# Patient Record
Sex: Male | Born: 1935 | Race: White | Hispanic: No | Marital: Married | Smoking: Never smoker
Health system: Southern US, Community
[De-identification: ages and names within clinical notes are randomized; demographics above are authoritative.]

## PROBLEM LIST (undated history)

## (undated) DIAGNOSIS — F0390 Unspecified dementia without behavioral disturbance: Secondary | ICD-10-CM

## (undated) DIAGNOSIS — R5383 Other fatigue: Secondary | ICD-10-CM

## (undated) DIAGNOSIS — H9190 Unspecified hearing loss, unspecified ear: Secondary | ICD-10-CM

## (undated) DIAGNOSIS — T8859XA Other complications of anesthesia, initial encounter: Secondary | ICD-10-CM

## (undated) DIAGNOSIS — D649 Anemia, unspecified: Secondary | ICD-10-CM

## (undated) DIAGNOSIS — G20 Parkinson's disease: Secondary | ICD-10-CM

## (undated) DIAGNOSIS — E785 Hyperlipidemia, unspecified: Secondary | ICD-10-CM

## (undated) DIAGNOSIS — J219 Acute bronchiolitis, unspecified: Secondary | ICD-10-CM

## (undated) DIAGNOSIS — M7071 Other bursitis of hip, right hip: Secondary | ICD-10-CM

## (undated) DIAGNOSIS — G20A1 Parkinson's disease without dyskinesia, without mention of fluctuations: Secondary | ICD-10-CM

## (undated) DIAGNOSIS — M810 Age-related osteoporosis without current pathological fracture: Secondary | ICD-10-CM

## (undated) DIAGNOSIS — M1990 Unspecified osteoarthritis, unspecified site: Secondary | ICD-10-CM

## (undated) DIAGNOSIS — Z85828 Personal history of other malignant neoplasm of skin: Secondary | ICD-10-CM

## (undated) DIAGNOSIS — M7072 Other bursitis of hip, left hip: Secondary | ICD-10-CM

## (undated) DIAGNOSIS — R5381 Other malaise: Secondary | ICD-10-CM

## (undated) DIAGNOSIS — R7303 Prediabetes: Secondary | ICD-10-CM

## (undated) DIAGNOSIS — E559 Vitamin D deficiency, unspecified: Secondary | ICD-10-CM

## (undated) DIAGNOSIS — R002 Palpitations: Secondary | ICD-10-CM

## (undated) DIAGNOSIS — I739 Peripheral vascular disease, unspecified: Secondary | ICD-10-CM

## (undated) HISTORY — DX: Age-related osteoporosis without current pathological fracture: M81.0

## (undated) HISTORY — DX: Vitamin D deficiency, unspecified: E55.9

## (undated) HISTORY — DX: Other bursitis of hip, right hip: M70.72

## (undated) HISTORY — DX: Other bursitis of hip, right hip: M70.71

## (undated) HISTORY — DX: Personal history of other malignant neoplasm of skin: Z85.828

## (undated) HISTORY — PX: COLONOSCOPY: SHX174

## (undated) HISTORY — DX: Unspecified hearing loss, unspecified ear: H91.90

## (undated) HISTORY — DX: Acute bronchiolitis, unspecified: J21.9

## (undated) HISTORY — DX: Hyperlipidemia, unspecified: E78.5

## (undated) HISTORY — DX: Other malaise: R53.83

## (undated) HISTORY — PX: OTHER SURGICAL HISTORY: SHX169

## (undated) HISTORY — DX: Other malaise: R53.81

## (undated) HISTORY — DX: Palpitations: R00.2

## (undated) HISTORY — DX: Unspecified osteoarthritis, unspecified site: M19.90

## (undated) HISTORY — DX: Anemia, unspecified: D64.9

---

## 2015-05-31 HISTORY — PX: CATARACT EXTRACTION: SUR2

## 2020-05-21 ENCOUNTER — Encounter: Payer: Self-pay | Admitting: Neurology

## 2020-08-03 NOTE — Progress Notes (Signed)
Assessment/Plan:   1.  Idiopathic Parkinson's disease.  The patient has tremor, bradykinesia, rigidity and mild postural instability.  -We discussed the diagnosis as well as pathophysiology of the disease.  We discussed treatment options as well as prognostic indicators.  Patient education was provided.  -We discussed that it used to be thought that levodopa would increase risk of melanoma but now it is believed that Parkinsons itself likely increases risk of melanoma. he is to get regular skin checks.  He was given names of local dermatologist.  He does have a history of basal cell carcinoma.  -Greater than 50% of the 60 minute visit was spent in counseling answering questions and talking about what to expect now as well as in the future.  We talked about medication options as well as potential future surgical options.  We talked about safety in the home.  -pt currently underdosed.  I am going to have him take his carbidopa/levodopa 25/100, 1 tablet at 8 AM/11 AM/2 PM/5 PM  -We will add carbidopa/levodopa 50/200 CR at bedtime.  Hopefully, this will help him get up at night and walk to the bathroom.  -Suspect he may need more carbidopa levodopa in the morning.  He was seen 2 hours after dosing this morning and still was rigid, but we will see how he does with the above dosing schedule.  -[facility]  -We discussed community resources in the area including patient support groups and community exercise programs for PD and pt education was provided to the patient.  He met with my LCSW today.  2.  Urinary frequency  -Encouraged him to establish with urology local.  He still needs to find a primary care physician as well.  He just moved to the area 2 weeks ago.  Subjective:   Greg [name] was seen today in the movement disorders clinic for neurologic consultation at the request of Geanie Berlin, MD.  The consultation is for the evaluation of Parkinson's  disease.  Patient was previously following with Dr. Cornelia Copa in [place], [place] through [facility].  This patient is accompanied in the office by his spouse who supplements the history.  Patient's first visit to Dr. Cornelia Copa was in January, 2019.  Symptoms began 2 years prior.  First symptom was slowness of movement.  He was started on levodopa in January, 2019.  When he followed up in April, 2019, notes indicate that patient had significant improvement with levodopa.  Patient was last seen by Dr. Cornelia Copa in April, 2021.     Specific Symptoms:  Tremor: No. (rarely if uses utensil when eating) Family hx of similar:  No. Voice: gotten weaker Sleep: sleeps well  Vivid Dreams:  Yes.    Acting out dreams:  No. Wet Pillows: No. Postural symptoms:  Yes.  , esp with clutter in his way.  He also notes some stutter steps with the L foot  Falls?  Yes.  , had one 05/03/20, they were packing to move and stepped on a wheel of a chair and fell - hurt back a little Bradykinesia symptoms: shuffling gait, slow movements and difficulty getting out of a chair Loss of smell:  No. but admits to long term lack of smell Loss of taste:  No. Urinary Incontinence:  No. Difficulty Swallowing:  No. Handwriting, micrographia: No. but always written small Trouble with ADL's:  No. but he is very slow and if needs to go fast, he  will get help  Trouble buttoning clothing: Yes.   Depression:  No. Memory changes:  No. Hallucinations:  No.  visual distortions: No. N/V:  No. Lightheaded:  No.  Syncope: No. Diplopia:  No. Dyskinesia:  No. Prior exposure to reglan/antipsychotics: No.  Neuroimaging of the brain has  previously been performed.  It is not available for my review today.  I do have the report.  It was done on May 14, 2017, MRI brain.  It was reported to show mild small vessel disease as well as a few old microhemorrhages in the right cerebellum and right cerebral hemisphere.  He also had a  MRI of the cervical spine that same date demonstrating multilevel degenerative changes, most pronounced at C3-C4.  Currently taking carbidopa/levodopa 25/100 at 8am/noon/8pm.  Wife reports lots of trouble getting OOB in middle of night and trouble in evening as well.  ALLERGIES:  No Known Allergies  CURRENT MEDICATIONS:  Current Outpatient Medications  Medication Instructions  . Calcium Carbonate-Vitamin D (CALCIUM-VITAMIN D3 PO) Oral  . carbidopa-levodopa (SINEMET IR) 25-100 MG tablet 1 tablet, Oral, 3 times daily  . Multiple Vitamins-Minerals (MACULAR HEALTH FORMULA) CAPS Oral    Objective:   VITALS:   Vitals:   08/07/20 0925  BP: 128/72  Pulse: 86  SpO2: 97%  Weight: 187 lb (84.8 kg)  Height: 5' 9.5" (1.765 m)    GEN:  The patient appears stated age and is in NAD. HEENT:  Normocephalic, atraumatic.  The mucous membranes are moist. The superficial temporal arteries are without ropiness or tenderness. CV:  RRR Lungs:  CTAB Neck/HEME:  There are no carotid bruits bilaterally.  Neurological examination:  Orientation: The patient is alert and oriented x3.  Cranial nerves: There is good facial symmetry. There is facial hypomimia.  Extraocular muscles are intact. The visual fields are full to confrontational testing. The speech is fluent and clear. Soft palate rises symmetrically and there is no tongue deviation. Hearing is intact to conversational tone. Sensation: Sensation is intact to light and pinprick throughout (facial, trunk, extremities). Vibration is decreased distally. There is no extinction with double simultaneous stimulation. There is no sensory dermatomal level identified. Motor: Strength is 5/5 in the bilateral upper and lower extremities.   Shoulder shrug is equal and symmetric.  There is no pronator drift. Deep tendon reflexes: Deep tendon reflexes are 2/4 at the bilateral biceps, triceps, brachioradialis, patella and achilles. Plantar responses are downgoing  bilaterally.  Movement examination: Tone: There is mild to mod increase, L>R Abnormal movements: there is LLE tremor, mild and rare.   Coordination:  There is decremation with RAM's, only with hand opening and closing and toe taps.   Gait and Station: The patient has no difficulty arising out of a deep-seated chair without the use of the hands. The patient's stride length is good, with minor drag of the left leg.  The patient has a negative pull test.       Total time spent on today's visit was 60 minutes, including both face-to-face time and nonface-to-face time.  Time included that spent on review of records (prior notes available to me/labs/imaging if pertinent), discussing treatment and goals, answering patient's questions and coordinating care.  Cc:  Pcp, No

## 2020-08-06 ENCOUNTER — Encounter: Payer: Self-pay | Admitting: *Deleted

## 2020-08-07 ENCOUNTER — Ambulatory Visit (INDEPENDENT_AMBULATORY_CARE_PROVIDER_SITE_OTHER): Payer: Medicare Other | Admitting: Neurology

## 2020-08-07 ENCOUNTER — Other Ambulatory Visit: Payer: Self-pay

## 2020-08-07 ENCOUNTER — Encounter: Payer: Self-pay | Admitting: Neurology

## 2020-08-07 VITALS — BP 128/72 | HR 86 | Ht 69.5 in | Wt 187.0 lb

## 2020-08-07 DIAGNOSIS — G20 Parkinson's disease: Secondary | ICD-10-CM | POA: Diagnosis not present

## 2020-08-07 MED ORDER — CARBIDOPA-LEVODOPA 25-100 MG PO TABS: 1.0000 | ORAL_TABLET | Freq: Four times a day (QID) | ORAL | 1 refills | Status: DC

## 2020-08-07 MED ORDER — CARBIDOPA-LEVODOPA ER 50-200 MG PO TBCR: 1.0000 | EXTENDED_RELEASE_TABLET | Freq: Every day | ORAL | 1 refills | Status: DC

## 2020-08-07 NOTE — Patient Instructions (Signed)
1.  Take carbidopa/levodopa 25/100, 1 tablet at 8am/10am/3pm/5pm Start Carbidopa Levodopa as follows:   As a reminder, carbidopa/levodopa can be taken at the same time as a carbohydrate, but we like to have you take your pill either 30 minutes before a protein source or 1 hour after as protein can interfere with carbidopa/levodopa absorption. 2.  ADD carbidopa/levodopa 50/200 CR at bedtime 3.  [facility].  As we discussed, it used to be thought that levodopa would increase risk of melanoma but now it is believed that Parkinsons itself likely increases risk of melanoma. I recommend yearly skin checks with a board certified dermatologist.  You can call [facility] or [facility] for an appointment.  [facility] Address: [address] [place], [place], [place] [address] Phone: [phone]  [facility] [address] [place], [place], [place] Phone: [phone]

## 2020-09-25 ENCOUNTER — Other Ambulatory Visit: Payer: Self-pay

## 2020-09-25 ENCOUNTER — Non-Acute Institutional Stay: Payer: Medicare Other | Admitting: Nurse Practitioner

## 2020-09-25 DIAGNOSIS — M159 Polyosteoarthritis, unspecified: Secondary | ICD-10-CM | POA: Insufficient documentation

## 2020-09-25 DIAGNOSIS — R609 Edema, unspecified: Secondary | ICD-10-CM | POA: Insufficient documentation

## 2020-09-25 DIAGNOSIS — G20 Parkinson's disease: Secondary | ICD-10-CM | POA: Insufficient documentation

## 2020-09-25 DIAGNOSIS — R350 Frequency of micturition: Secondary | ICD-10-CM

## 2020-09-25 DIAGNOSIS — M810 Age-related osteoporosis without current pathological fracture: Secondary | ICD-10-CM | POA: Insufficient documentation

## 2020-09-25 DIAGNOSIS — N401 Enlarged prostate with lower urinary tract symptoms: Secondary | ICD-10-CM | POA: Diagnosis not present

## 2020-09-25 DIAGNOSIS — N400 Enlarged prostate without lower urinary tract symptoms: Secondary | ICD-10-CM | POA: Insufficient documentation

## 2020-09-25 DIAGNOSIS — G20A1 Parkinson's disease without dyskinesia, without mention of fluctuations: Secondary | ICD-10-CM | POA: Insufficient documentation

## 2020-09-25 DIAGNOSIS — M8949 Other hypertrophic osteoarthropathy, multiple sites: Secondary | ICD-10-CM | POA: Diagnosis not present

## 2020-09-25 DIAGNOSIS — E785 Hyperlipidemia, unspecified: Secondary | ICD-10-CM | POA: Insufficient documentation

## 2020-09-25 NOTE — Progress Notes (Signed)
Provider:  Marlana Latus NP Location:   clinic [place]   Place of Service:  Clinic (12)  PCP: Pcp, No Patient Care Team: Pcp, No as PCP - General [name], Eustace Quail, DO as Consulting Physician (Neurology)  Extended Emergency Contact Information Primary Emergency Contact: kyland, no Mobile Phone: [phone] Relation: Spouse Secondary Emergency Contact: abhinav, mayorquin Mobile Phone: [phone] Relation: Son  Code Status: DNR Goals of Care: Advanced Directive information Advanced Directives 09/26/2020  Does Patient Have a Medical Advance Directive? No  Type of Advance Directive -  Would patient like information on creating a medical advance directive? No - Patient declined      Chief Complaint  Patient presents with   Establish Care    Patient presents to establish care.      HPI: Patient is a 87 y.o. male seen today for admission to [facility]  Hx of Parkinson's disease, f/u Neurology, well controlled tremor Bradykinesia, mild rigidity, postural instability, had one fall in the past, on Sinemet  BPH/Urinary frequency, 1-2x/night,  f/u Urology  OA in hands base of thumbs  Minimal swelling in ankles for years. Suggested to use compression hosiery with zipper on side.   OP took Fosamax for 7 years, stopped 2018, on Can Vit D, no fxs  Hyperlipidemia, not taking statin  Past Medical History:  Diagnosis Date   Acute bronchiolitis    Anemia    Bursitis of both hips    Hearing reduced    hearing aid   History of basal cell carcinoma    Hyperlipidemia    Malaise and fatigue    Osteoarthritis    hand   Osteoporosis    Palpitation    Vitamin D deficiency    Past Surgical History:  Procedure Laterality Date   CATARACT EXTRACTION  05/31/2015   Eye/right   COLONOSCOPY  02/05/2005, 03/06/2005   normal, 10 years ago   MOHS micrographic surgery     Face surgery    reports that he has never smoked. He has never used smokeless tobacco. He reports that he does not drink alcohol  and does not use drugs. Social History   Socioeconomic History   Marital status: Married    Spouse name: [name]   Number of children: 2   Years of education: Not on file   Highest education level: Not on file  Occupational History   Occupation: retired    Comment: art professor  Tobacco Use   Smoking status: Never   Smokeless tobacco: Never  Vaping Use   Vaping Use: Never used  Substance and Sexual Activity   Alcohol use: Never   Drug use: Never   Sexual activity: Not on file  Other Topics Concern   Not on file  Social History Narrative   Pt just move from [place] to [place]   Pt leaving with wife   Social Determinants of Health   Financial Resource Strain: Not on file  Food Insecurity: Not on file  Transportation Needs: Not on file  Physical Activity: Not on file  Stress: Not on file  Social Connections: Not on file  Intimate Partner Violence: Not on file    Functional Status Survey:    Family History  Problem Relation Age of Onset   Prostate cancer Father    Skin cancer Brother     Health Maintenance  Topic Date Due   TETANUS/TDAP  Never done   Zoster Vaccines- Shingrix (1 of 2) Never done   COVID-19 Vaccine (2 Nature conservation officer series)  09/22/2020   INFLUENZA VACCINE  11/05/2020   PNA vac Low Risk Adult  Completed   HPV VACCINES  Aged Out    No Known Allergies  Allergies as of 09/25/2020   No Known Allergies      Medication List        Accurate as of September 25, 2020 11:59 PM. If you have any questions, ask your nurse or doctor.          CALCIUM-VITAMIN D3 PO Take by mouth.   carbidopa-levodopa 25-100 MG tablet Commonly known as: SINEMET IR Take 1 tablet by mouth 4 (four) times daily.   carbidopa-levodopa 50-200 MG tablet Commonly known as: SINEMET CR Take 1 tablet by mouth at bedtime.   Macular Health Formula Caps Take by mouth.        Review of Systems  Constitutional:  Negative for activity change, appetite change and fever.   HENT:  Negative for congestion, trouble swallowing and voice change.   Eyes:  Negative for visual disturbance.  Respiratory:  Negative for cough, choking and shortness of breath.   Cardiovascular:  Positive for leg swelling. Negative for chest pain and palpitations.  Gastrointestinal:  Negative for abdominal pain, constipation, diarrhea, nausea and vomiting.  Genitourinary:  Negative for difficulty urinating and urgency.       1-2x/night nocturnal urination.   Musculoskeletal:  Positive for arthralgias and gait problem.  Skin:  Negative for color change.  Neurological:  Positive for tremors. Negative for speech difficulty, weakness and headaches.  Psychiatric/Behavioral:  Negative for confusion and sleep disturbance. The patient is not nervous/anxious.    Vitals:   09/26/20 0854  BP: 136/72  Pulse: 86  Resp: 18  Temp: 97.9 F (36.6 C)  SpO2: 97%  Weight: 190 lb 6.4 oz (86.4 kg)  Height: '5\' 9"'  (1.753 m)   Body mass index is 28.12 kg/m. Physical Exam Vitals reviewed.  Constitutional:      General: He is not in acute distress.    Appearance: Normal appearance. He is not ill-appearing, toxic-appearing or diaphoretic.  HENT:     Head: Normocephalic and atraumatic.     Nose: Nose normal.     Mouth/Throat:     Mouth: Mucous membranes are moist.  Eyes:     Extraocular Movements: Extraocular movements intact.     Conjunctiva/sclera: Conjunctivae normal.     Pupils: Pupils are equal, round, and reactive to light.  Cardiovascular:     Rate and Rhythm: Normal rate and regular rhythm.     Heart sounds: No murmur heard. Pulmonary:     Effort: Pulmonary effort is normal.     Breath sounds: No rales.  Abdominal:     Palpations: Abdomen is soft.     Tenderness: There is no abdominal tenderness. There is no guarding or rebound.  Musculoskeletal:     Cervical back: Normal range of motion and neck supple. No rigidity.     Right lower leg: Edema present.     Left lower leg: Edema  present.     Comments: Trace to 1+ edema BLE  Skin:    General: Skin is warm and dry.  Neurological:     General: No focal deficit present.     Mental Status: He is alert and oriented to person, place, and time. Mental status is at baseline.     Motor: No weakness.     Gait: Gait abnormal.     Comments: Mild overall stiffness   Psychiatric:  Mood and Affect: Mood normal.        Behavior: Behavior normal.        Thought Content: Thought content normal.        Judgment: Judgment normal.    Labs reviewed: Basic Metabolic Panel: No results for input(s): NA, K, CL, CO2, GLUCOSE, BUN, CREATININE, CALCIUM, MG, PHOS in the last 8760 hours. Liver Function Tests: No results for input(s): AST, ALT, ALKPHOS, BILITOT, PROT, ALBUMIN in the last 8760 hours. No results for input(s): LIPASE, AMYLASE in the last 8760 hours. No results for input(s): AMMONIA in the last 8760 hours. CBC: No results for input(s): WBC, NEUTROABS, HGB, HCT, MCV, PLT in the last 8760 hours. Cardiac Enzymes: No results for input(s): CKTOTAL, CKMB, CKMBINDEX, TROPONINI in the last 8760 hours. BNP: Invalid input(s): POCBNP No results found for: HGBA1C No results found for: TSH No results found for: VITAMINB12 No results found for: FOLATE No results found for: IRON, TIBC, FERRITIN  Imaging and Procedures obtained prior to SNF admission: Patient was never admitted.  Assessment/Plan  Parkinson's disease ([place]) f/u Neurology, well controlled tremor Bradykinesia, mild rigidity, postural instability, had one fall in the past, on Sinemet Obtain CBC/diff, CMP/eGFR, TSH, Vit D, lipid panel,   BPH (benign prostatic hyperplasia) BPH/Urinary frequency, 1-2x/night,  f/u Urology  Peripheral edema Minimal swelling in ankles for years. Suggested to use compression hosiery with zipper on side.   Osteoarthritis, multiple sites in hands base of thumbs, not disabling.   Osteoporosis OP took Fosamax for 7 years, stopped  2018, on Can Vit D, no fxs  Hyperlipidemia Not take statin.   Family/ staff Communication: plan of care reviewed with the patient and charge nurse.   Labs/tests ordered: CBC/diff, CMP/eGFR, TSH, Vit D, lipid panel,

## 2020-09-25 NOTE — Assessment & Plan Note (Signed)
Not take statin 

## 2020-09-25 NOTE — Assessment & Plan Note (Signed)
OP took Fosamax for 7 years, stopped 2018, on Can Vit D, no fxs

## 2020-09-25 NOTE — Assessment & Plan Note (Signed)
BPH/Urinary frequency, 1-2x/night,  f/u Urology

## 2020-09-25 NOTE — Assessment & Plan Note (Signed)
in hands base of thumbs, not disabling.

## 2020-09-25 NOTE — Assessment & Plan Note (Addendum)
f/u Neurology, well controlled tremor Bradykinesia, mild rigidity, postural instability, had one fall in the past, on Sinemet Obtain CBC/diff, CMP/eGFR, TSH, Vit D, lipid panel,

## 2020-09-25 NOTE — Assessment & Plan Note (Signed)
Minimal swelling in ankles for years. Suggested to use compression hosiery with zipper on side.

## 2020-09-26 ENCOUNTER — Encounter: Payer: Self-pay | Admitting: Nurse Practitioner

## 2020-10-08 ENCOUNTER — Emergency Department (HOSPITAL_COMMUNITY): Payer: Medicare Other

## 2020-10-08 ENCOUNTER — Inpatient Hospital Stay (HOSPITAL_COMMUNITY)
Admission: EM | Admit: 2020-10-08 | Discharge: 2020-10-12 | DRG: 481 | Disposition: A | Discharge status: Skilled Nursing Facility | Payer: Medicare Other | Attending: Family Medicine | Admitting: Family Medicine

## 2020-10-08 ENCOUNTER — Inpatient Hospital Stay (HOSPITAL_COMMUNITY): Payer: Medicare Other

## 2020-10-08 DIAGNOSIS — I10 Essential (primary) hypertension: Secondary | ICD-10-CM | POA: Diagnosis present

## 2020-10-08 DIAGNOSIS — S73001A Unspecified subluxation of right hip, initial encounter: Secondary | ICD-10-CM | POA: Diagnosis present

## 2020-10-08 DIAGNOSIS — S32401A Unspecified fracture of right acetabulum, initial encounter for closed fracture: Secondary | ICD-10-CM | POA: Diagnosis not present

## 2020-10-08 DIAGNOSIS — Y9289 Other specified places as the place of occurrence of the external cause: Secondary | ICD-10-CM | POA: Diagnosis not present

## 2020-10-08 DIAGNOSIS — G20 Parkinson's disease: Secondary | ICD-10-CM | POA: Diagnosis present

## 2020-10-08 DIAGNOSIS — Z9841 Cataract extraction status, right eye: Secondary | ICD-10-CM

## 2020-10-08 DIAGNOSIS — E871 Hypo-osmolality and hyponatremia: Secondary | ICD-10-CM | POA: Diagnosis not present

## 2020-10-08 DIAGNOSIS — D696 Thrombocytopenia, unspecified: Secondary | ICD-10-CM | POA: Diagnosis not present

## 2020-10-08 DIAGNOSIS — Z419 Encounter for procedure for purposes other than remedying health state, unspecified: Secondary | ICD-10-CM

## 2020-10-08 DIAGNOSIS — E559 Vitamin D deficiency, unspecified: Secondary | ICD-10-CM | POA: Diagnosis present

## 2020-10-08 DIAGNOSIS — S72001A Fracture of unspecified part of neck of right femur, initial encounter for closed fracture: Secondary | ICD-10-CM

## 2020-10-08 DIAGNOSIS — Z20822 Contact with and (suspected) exposure to covid-19: Secondary | ICD-10-CM | POA: Diagnosis present

## 2020-10-08 DIAGNOSIS — S32481A Displaced dome fracture of right acetabulum, initial encounter for closed fracture: Principal | ICD-10-CM | POA: Diagnosis present

## 2020-10-08 DIAGNOSIS — D62 Acute posthemorrhagic anemia: Secondary | ICD-10-CM | POA: Diagnosis not present

## 2020-10-08 DIAGNOSIS — M810 Age-related osteoporosis without current pathological fracture: Secondary | ICD-10-CM | POA: Diagnosis present

## 2020-10-08 DIAGNOSIS — R011 Cardiac murmur, unspecified: Secondary | ICD-10-CM | POA: Diagnosis present

## 2020-10-08 DIAGNOSIS — K5900 Constipation, unspecified: Secondary | ICD-10-CM | POA: Diagnosis present

## 2020-10-08 DIAGNOSIS — R350 Frequency of micturition: Secondary | ICD-10-CM | POA: Diagnosis present

## 2020-10-08 DIAGNOSIS — R7303 Prediabetes: Secondary | ICD-10-CM | POA: Diagnosis present

## 2020-10-08 DIAGNOSIS — Z85828 Personal history of other malignant neoplasm of skin: Secondary | ICD-10-CM | POA: Diagnosis not present

## 2020-10-08 DIAGNOSIS — T148XXA Other injury of unspecified body region, initial encounter: Secondary | ICD-10-CM

## 2020-10-08 DIAGNOSIS — S32511A Fracture of superior rim of right pubis, initial encounter for closed fracture: Secondary | ICD-10-CM | POA: Diagnosis present

## 2020-10-08 DIAGNOSIS — Z79899 Other long term (current) drug therapy: Secondary | ICD-10-CM | POA: Diagnosis not present

## 2020-10-08 DIAGNOSIS — Z8042 Family history of malignant neoplasm of prostate: Secondary | ICD-10-CM | POA: Diagnosis not present

## 2020-10-08 DIAGNOSIS — W1839XA Other fall on same level, initial encounter: Secondary | ICD-10-CM | POA: Diagnosis present

## 2020-10-08 DIAGNOSIS — S32501A Unspecified fracture of right pubis, initial encounter for closed fracture: Secondary | ICD-10-CM

## 2020-10-08 DIAGNOSIS — E785 Hyperlipidemia, unspecified: Secondary | ICD-10-CM | POA: Diagnosis present

## 2020-10-08 LAB — CBC WITH DIFFERENTIAL/PLATELET
Abs Immature Granulocytes: 0.06 10*3/uL (ref 0.00–0.07)
Basophils Absolute: 0 10*3/uL (ref 0.0–0.1)
Basophils Relative: 0 %
Eosinophils Absolute: 0 10*3/uL (ref 0.0–0.5)
Eosinophils Relative: 0 %
HCT: 39.5 % (ref 39.0–52.0)
Hemoglobin: 13.2 g/dL (ref 13.0–17.0)
Immature Granulocytes: 1 %
Lymphocytes Relative: 5 %
Lymphs Abs: 0.5 10*3/uL — ABNORMAL LOW (ref 0.7–4.0)
MCH: 32.7 pg (ref 26.0–34.0)
MCHC: 33.4 g/dL (ref 30.0–36.0)
MCV: 97.8 fL (ref 80.0–100.0)
Monocytes Absolute: 0.8 10*3/uL (ref 0.1–1.0)
Monocytes Relative: 7 %
Neutro Abs: 10.2 10*3/uL — ABNORMAL HIGH (ref 1.7–7.7)
Neutrophils Relative %: 87 %
Platelets: 160 10*3/uL (ref 150–400)
RBC: 4.04 MIL/uL — ABNORMAL LOW (ref 4.22–5.81)
RDW: 13.7 % (ref 11.5–15.5)
WBC: 11.7 10*3/uL — ABNORMAL HIGH (ref 4.0–10.5)
nRBC: 0 % (ref 0.0–0.2)

## 2020-10-08 LAB — RESP PANEL BY RT-PCR (FLU A&B, COVID) ARPGX2
Influenza A by PCR: NEGATIVE
Influenza B by PCR: NEGATIVE
SARS Coronavirus 2 by RT PCR: NEGATIVE

## 2020-10-08 LAB — BASIC METABOLIC PANEL
Anion gap: 8 (ref 5–15)
BUN: 27 mg/dL — ABNORMAL HIGH (ref 8–23)
CO2: 26 mmol/L (ref 22–32)
Calcium: 8.8 mg/dL — ABNORMAL LOW (ref 8.9–10.3)
Chloride: 100 mmol/L (ref 98–111)
Creatinine, Ser: 1.17 mg/dL (ref 0.61–1.24)
GFR, Estimated: >60 mL/min (ref >60)
Glucose, Bld: 139 mg/dL — ABNORMAL HIGH (ref 70–99)
Potassium: 4.5 mmol/L (ref 3.5–5.1)
Sodium: 134 mmol/L — ABNORMAL LOW (ref 135–145)

## 2020-10-08 LAB — HEMOGLOBIN A1C
Hgb A1c MFr Bld: 5.9 % — ABNORMAL HIGH (ref 4.8–5.6)
Mean Plasma Glucose: 122.63 mg/dL

## 2020-10-08 MED ORDER — CARBIDOPA-LEVODOPA 25-100 MG PO TABS
1.0000 | ORAL_TABLET | Freq: Four times a day (QID) | ORAL | Status: DC
  Administered 2020-10-08 – 2020-10-12 (×14): 1 via ORAL
  Filled 2020-10-08 (×14): qty 1

## 2020-10-08 MED ORDER — CARBIDOPA-LEVODOPA ER 50-200 MG PO TBCR
1.0000 | EXTENDED_RELEASE_TABLET | Freq: Every day | ORAL | Status: DC
  Administered 2020-10-09 – 2020-10-11 (×4): 1 via ORAL
  Filled 2020-10-08 (×5): qty 1

## 2020-10-08 MED ORDER — CARBIDOPA-LEVODOPA 25-100 MG PO TABS
1.0000 | ORAL_TABLET | Freq: Once | ORAL | Status: AC
  Administered 2020-10-08: 1 via ORAL
  Filled 2020-10-08: qty 1

## 2020-10-08 MED ORDER — ENOXAPARIN SODIUM 30 MG/0.3ML IJ SOSY
30.0000 mg | PREFILLED_SYRINGE | INTRAMUSCULAR | Status: DC
  Administered 2020-10-10 – 2020-10-12 (×3): 30 mg via SUBCUTANEOUS
  Filled 2020-10-08 (×4): qty 0.3

## 2020-10-08 MED ORDER — CARVEDILOL 3.125 MG PO TABS
3.1250 mg | ORAL_TABLET | Freq: Two times a day (BID) | ORAL | Status: DC
  Administered 2020-10-09 – 2020-10-12 (×7): 3.125 mg via ORAL
  Filled 2020-10-08 (×7): qty 1

## 2020-10-08 MED ORDER — HYDROMORPHONE HCL 1 MG/ML IJ SOLN: 0.5000 mg | INTRAMUSCULAR | Status: DC | PRN

## 2020-10-08 MED ORDER — CALCIUM CARBONATE-VITAMIN D 500-200 MG-UNIT PO TABS
2.0000 | ORAL_TABLET | Freq: Two times a day (BID) | ORAL | Status: DC
  Administered 2020-10-09 – 2020-10-12 (×8): 2 via ORAL
  Filled 2020-10-08 (×8): qty 2

## 2020-10-08 MED ORDER — DOCUSATE SODIUM 100 MG PO CAPS
100.0000 mg | ORAL_CAPSULE | Freq: Two times a day (BID) | ORAL | Status: DC
  Administered 2020-10-09 (×2): 100 mg via ORAL
  Filled 2020-10-08 (×2): qty 1

## 2020-10-08 MED ORDER — HYDRALAZINE HCL 25 MG PO TABS
25.0000 mg | ORAL_TABLET | Freq: Four times a day (QID) | ORAL | Status: DC | PRN
  Administered 2020-10-08: 25 mg via ORAL
  Filled 2020-10-08: qty 1

## 2020-10-08 MED ORDER — HYDROCODONE-ACETAMINOPHEN 5-325 MG PO TABS
1.0000 | ORAL_TABLET | Freq: Four times a day (QID) | ORAL | Status: DC | PRN
  Administered 2020-10-08: 1 via ORAL
  Filled 2020-10-08: qty 1

## 2020-10-08 NOTE — ED Triage Notes (Signed)
PT BIB [facility] from [facility] for a mechanical fall r/t to Parkinsons.  Pt got tripped up, fell, did not hit head, no LOC, no thinners, but complains of right hip pain.  EMS did not note any shortening or rotation at this time.  Pt does not use a walker at this point.

## 2020-10-08 NOTE — ED Provider Notes (Addendum)
[facility] EMERGENCY DEPARTMENT Provider Note   CSN: [id number] Arrival date & time: 10/08/20  1552     History Chief Complaint  Patient presents with   Greg [name] is a 87 y.o. male. Patient's son was in the room at the time of initial evaluation.    HPI Patient is here by EMS for evaluation of right hip pain.  He states this occurred when he fell, at his facility.  He recalls helping his wife get ready to take a nap, when he fell backwards striking his head on the wall, and his right hip on the floor.  He was unable to get up because of her hip pain.  He denies injury to head, neck or back.  He denies loss of consciousness.  He recalls eating his normal breakfast and lunch today and taking his morning Parkinson's medication.  He did not take the second dose of Sinemet.  He denies recent illnesses including fever, vomiting, coughing, weakness or dizziness.  There are no other known modifying factors.    Past Medical History:  Diagnosis Date   Acute bronchiolitis    Anemia    Bursitis of both hips    Hearing reduced    hearing aid   History of basal cell carcinoma    Hyperlipidemia    Malaise and fatigue    Osteoarthritis    hand   Osteoporosis    Palpitation    Vitamin D deficiency     Patient Active Problem List   Diagnosis Date Noted   Closed right hip fracture (HCC) 10/08/2020   Parkinson's disease (HCC) 09/25/2020   BPH (benign prostatic hyperplasia) 09/25/2020   Peripheral edema 09/25/2020   Osteoarthritis, multiple sites 09/25/2020   Osteoporosis 09/25/2020   Hyperlipidemia 09/25/2020    Past Surgical History:  Procedure Laterality Date   CATARACT EXTRACTION  05/31/2015   Eye/right   COLONOSCOPY  02/05/2005, 03/06/2005   normal, 10 years ago   MOHS micrographic surgery     Face surgery       Family History  Problem Relation Age of Onset   Prostate cancer Father    Skin cancer Brother     Social History   Tobacco  Use   Smoking status: Never   Smokeless tobacco: Never  Vaping Use   Vaping Use: Never used  Substance Use Topics   Alcohol use: Never   Drug use: Never    Home Medications Prior to Admission medications   Medication Sig Start Date End Date Taking? Authorizing Provider  Calcium Carbonate-Vitamin D (CALCIUM-VITAMIN D3 PO) Take by mouth.    [provider]  carbidopa-levodopa (SINEMET CR) 50-200 MG tablet Take 1 tablet by mouth at bedtime. 08/07/20   [name], Octaviano Batty, DO  carbidopa-levodopa (SINEMET IR) 25-100 MG tablet Take 1 tablet by mouth 4 (four) times daily. 08/07/20   [name], Octaviano Batty, DO  Multiple Vitamins-Minerals [facility] FORMULA) CAPS Take by mouth.    [provider]    Allergies    Patient has no known allergies.  Review of Systems   Review of Systems  All other systems reviewed and are negative.  Physical Exam Updated Vital Signs BP (!) 157/90   Pulse 86   Temp 98.4 F (36.9 C) (Oral)   Resp 19   SpO2 97%   Physical Exam Vitals and nursing note reviewed.  Constitutional:      General: He is not in acute distress.    Appearance:  He is well-developed. He is not ill-appearing, toxic-appearing or diaphoretic.  HENT:     Head: Normocephalic and atraumatic.     Right Ear: External ear normal.     Left Ear: External ear normal.  Eyes:     Conjunctiva/sclera: Conjunctivae normal.     Pupils: Pupils are equal, round, and reactive to light.  Neck:     Trachea: Phonation normal.  Cardiovascular:     Rate and Rhythm: Normal rate and regular rhythm.     Heart sounds: Normal heart sounds.  Pulmonary:     Effort: Pulmonary effort is normal.     Breath sounds: Normal breath sounds.  Chest:     Chest wall: No tenderness.  Abdominal:     General: There is no distension.     Palpations: Abdomen is soft.     Tenderness: There is no abdominal tenderness.  Musculoskeletal:     Cervical back: Normal range of motion and neck supple.     Comments: He  guards against moving the right hip secondary to pain.  There is no deformity of the right hip, or right leg shortening.  No other large joint abnormality.  Skin:    General: Skin is warm and dry.  Neurological:     Mental Status: He is alert and oriented to person, place, and time.     Cranial Nerves: No cranial nerve deficit.     Sensory: No sensory deficit.     Motor: No abnormal muscle tone.     Coordination: Coordination normal.  Psychiatric:        Mood and Affect: Mood normal.        Behavior: Behavior normal.    ED Results / Procedures / Treatments   Labs (all labs ordered are listed, but only abnormal results are displayed) Labs Reviewed  BASIC METABOLIC PANEL - Abnormal; Notable for the following components:      Result Value   Sodium 134 (*)    Glucose, Bld 139 (*)    BUN 27 (*)    Calcium 8.8 (*)    All other components within normal limits  CBC WITH DIFFERENTIAL/PLATELET - Abnormal; Notable for the following components:   WBC 11.7 (*)    RBC 4.04 (*)    Neutro Abs 10.2 (*)    Lymphs Abs 0.5 (*)    All other components within normal limits  RESP PANEL BY RT-PCR (FLU A&B, COVID) [id number]  URINALYSIS, ROUTINE W REFLEX MICROSCOPIC  CBC  HEMOGLOBIN A1C    EKG EKG Interpretation  Date/Time:  Monday October 08 2020 20:04:01 EDT Ventricular Rate:  98 PR Interval:  168 QRS Duration: 76 QT Interval:  354 QTC Calculation: 451 R Axis:   11 Text Interpretation: Normal sinus rhythm Normal ECG Since last tracing 10 minutes ago No significant change was found Confirmed by Mancel Bale [phone]) on 10/08/2020 8:22:50 PM  Radiology CT PELVIS WO CONTRAST  Result Date: 10/08/2020 CLINICAL DATA:  Status post fall.  Right acetabular fracture. EXAM: CT PELVIS WITHOUT CONTRAST TECHNIQUE: Multidetector CT imaging of the pelvis was performed following the standard protocol without intravenous contrast. COMPARISON:  Hip films from earlier the same day FINDINGS: Urinary Tract:  Bladder  is moderately distended. Bowel:  Unremarkable. Vascular/Lymphatic: Atherosclerotic calcification noted lower abdominal aorta and iliac arteries. Reproductive:  Prostate gland is enlarged. Other:  None. Musculoskeletal: Severely comminuted right acetabular fracture identified with fracture fragments distracted by at least 15 mm. Fracture involves the roof of the acetabulum, medial acetabulum  and posterior lip of the acetabulum. Fracture extends cranially up into the iliac wing and anteriorly to involve the right superior pubic ramus. Associated comminuted fracture noted inferior right pubic ramus. Soft tissue windows demonstrate extensive hemorrhage in the extraperitoneal pelvic sidewall extending into the presacral space and right groin region. IMPRESSION: Severely comminuted right acetabular fracture involving the roof of the acetabulum, medial acetabulum and posterior lip. Fracture extends anteriorly into the superior pubic ramus. Associated comminuted fracture noted right inferior pubic ramus. Extensive hemorrhage in the soft tissues adjacent to the right hip, involving the extraperitoneal right pelvic sidewall. Electronically Signed   By: Kennith Center M.D.   On: 10/08/2020 18:30   DG Hip Unilat With Pelvis 2-3 Views Right  Result Date: 10/08/2020 CLINICAL DATA:  RIGHT hip pain status post fall EXAM: DG HIP (WITH OR WITHOUT PELVIS) 2-3V RIGHT COMPARISON:  None. FINDINGS: There is protrusion of the RIGHT femoral head through the medial border of the acetabulum. There is discontinuity of the iliopectineal line on the RIGHT. No RIGHT femoral neck fracture. IMPRESSION: Fracture through the medial wall of the RIGHT acetabulum with protrusion of the femoral head. Electronically Signed   By: Genevive Bi M.D.   On: 10/08/2020 17:17    Procedures .Critical Care  Date/Time: 10/08/2020 7:09 PM Performed by: Mancel Bale, MD Authorized by: Mancel Bale, MD   Critical care provider statement:    Critical  care time (minutes):  35   Critical care start time:  10/08/2020 4:15 PM   Critical care end time:  10/08/2020 7:09 PM   Critical care time was exclusive of:  Separately billable procedures and treating other patients   Critical care was necessary to treat or prevent imminent or life-threatening deterioration of the following conditions:  Trauma   Critical care was time spent personally by me on the following activities:  Blood draw for specimens, development of treatment plan with patient or surrogate, discussions with consultants, evaluation of patient's response to treatment, examination of patient, obtaining history from patient or surrogate, ordering and performing treatments and interventions, ordering and review of laboratory studies, pulse oximetry, re-evaluation of patient's condition, review of old charts and ordering and review of radiographic studies   Medications Ordered in ED Medications  carbidopa-levodopa (SINEMET CR) 50-200 MG per tablet controlled release 1 tablet (has no administration in time range)  carbidopa-levodopa (SINEMET IR) 25-100 MG per tablet immediate release 1 tablet (has no administration in time range)  calcium-vitamin D (OSCAL WITH D) 500-200 MG-UNIT per tablet 2 tablet (has no administration in time range)  HYDROcodone-acetaminophen (NORCO/VICODIN) 5-325 MG per tablet 1-2 tablet (has no administration in time range)  enoxaparin (LOVENOX) injection 30 mg (has no administration in time range)  HYDROmorphone (DILAUDID) injection 0.5 mg (has no administration in time range)  docusate sodium (COLACE) capsule 100 mg (has no administration in time range)  carvedilol (COREG) tablet 3.125 mg (has no administration in time range)  carbidopa-levodopa (SINEMET IR) 25-100 MG per tablet immediate release 1 tablet (1 tablet Oral Given 10/08/20 1840)    ED Course  I have reviewed the triage vital signs and the nursing notes.  Pertinent labs & imaging results that were available  during my care of the patient were reviewed by me and considered in my medical decision making (see chart for details).  Clinical Course as of 10/08/20 2022  Mon Oct 08, 2020  1712 Case discussed with Dr. Linna Caprice, orthopedist on-call.  He request patient be placed in Buck's traction, and  kept n.p.o. after midnight for surgical intervention tomorrow.  He will contact Dr. Marcello FennelHande, orthopedic traumatologist for assistance with operative management. [EW]    Clinical Course User Index [EW] Mancel BaleWentz, Casso, MD   MDM Rules/Calculators/A&P                           Patient Vitals for the past 24 hrs:  BP Temp Temp src Pulse Resp SpO2  10/08/20 1745 (!) 157/90 -- -- 86 19 97 %  10/08/20 1730 (!) 163/84 -- -- 88 18 97 %  10/08/20 1715 (!) 145/86 -- -- 86 20 96 %  10/08/20 1700 (!) 176/99 -- -- 87 (!) 23 97 %  10/08/20 1645 (!) 170/93 -- -- 87 19 98 %  10/08/20 1606 (!) 167/96 98.4 F (36.9 C) Oral 87 20 96 %  10/08/20 1602 -- -- -- -- -- 95 %    7:12 PM Reevaluation with update and discussion. After initial assessment and treatment, an updated evaluation reveals no change in clinical status, vital signs remained stable, no indication for bleeding related to pelvic fracture.  As discussed with patient, and son, all questions answered. Mancel BaleElliott Denton Derks   Medical Decision Making:  This patient is presenting for evaluation of fall with right hip area injury, which does require a range of treatment options, and is a complaint that involves a high risk of morbidity and mortality. The differential diagnoses include fracture, contusion, visceral injury. I decided to review old records, and in summary elderly male, fell today, likely mechanical etiology.  He is not anticoagulated.  He is fairly healthy other than debilitated state and Parkinson's disease.  I obtained additional historical information from son at bedside.  Clinical Laboratory Tests Ordered, included CBC and Metabolic panel. Review indicates  normal except white count high, sodium low, glucose high, BUN high, calcium low. Radiologic Tests Ordered, included AP pelvis, CT pelvis.  I independently Visualized: Radiograph images, which show comminuted pelvic fracture, and acetabulum, with associated fractures of superior and inferior rami.  This is an unstable injury.   Critical Interventions-clinical evaluation, radiography, advanced radiography, observation and reassess  After These Interventions, the Patient was reevaluated and was found with complicated pelvic fracture, at risk for decompensation with bleeding associated with comminuted fracture.  Patient's vital signs remained stable.  Consultation with orthopedics who will see the patient tomorrow for likely surgical intervention.  Patient be admitted to hospitalist and require medical clearance, prior to surgery.  Plan for n.p.o. after midnight.  CRITICAL CARE-yes Performed by: Mancel BaleElliott Kassidie Hendriks  Nursing Notes Reviewed/ Care Coordinated Applicable Imaging Reviewed Interpretation of Laboratory Data incorporated into ED treatment  Discussed with hospitalist to arrange admission.    Final Clinical Impression(s) / ED Diagnoses Final diagnoses:  Closed displaced fracture of right acetabulum, unspecified portion of acetabulum, initial encounter (HCC)  Closed nondisplaced fracture of right pubis, initial encounter [facility])    Rx / DC Orders ED Discharge Orders     None        Mancel BaleWentz, Decou, MD 10/08/20 Darnell [id number]    Mancel BaleWentz, Loder, MD 10/08/20 2023

## 2020-10-08 NOTE — Progress Notes (Signed)
Called by EDP for R ABC acetabulum fracture with disruption of iliopectineal line. CT pending. Recommend Buck's traction, NWB.  Will discuss with ortho trauma. NPO after MN. Full consult to follow.

## 2020-10-08 NOTE — ED Notes (Signed)
Pt ate regular diet tray from cafeteria with the assistance of his son.

## 2020-10-08 NOTE — H&P (Addendum)
History and Physical    Greg [name] PPJ:[id number] DOB: 31-Jul-1931 DOA: 10/08/2020  PCP: [name], MD (Confirm with patient/family/NH records and if not entered, this has to be entered at [facility] point of entry) Patient coming from: Home  I have personally briefly reviewed patient's old medical records in [facility]  Chief Complaint: I fell and hit my hip  HPI: Greg [name] is a 87 y.o. male with medical history significant of Parkinson's disease poorly controlled, osteoporosis, presented with mechanical fall and hip fracture.  Patient has chronic ambulation dysfunction secondary to Parkinson's disease and has been working with PT on weekdays.  This afternoon, patient was trying to standing up from chair to go to take a nap, but fell backward and hit his right hip.  He thought he might rise up too quick but denied any feeling of lightheaded chest pain palpitations or blurry vision.  No loss of consciousness.  Remember might have had hit his head on the right side but not exactly sure.  Patient moved from [place] to [place] recently.'s Parkinson disease has not been well controlled, recently started and it at bedtime slow release Sinemet to his regular regimen 3 times a day.  He has been actively participated in physical therapy, able to walk on a treadmill for >15 min everyday, no pain or shortness of breath or near syncope.  ED Course: Pelvic x-ray and CT showed right hip acetabulum fracture.  Review of Systems: As per HPI otherwise 14 point review of systems negative.    Past Medical History:  Diagnosis Date   Acute bronchiolitis    Anemia    Bursitis of both hips    Hearing reduced    hearing aid   History of basal cell carcinoma    Hyperlipidemia    Malaise and fatigue    Osteoarthritis    hand   Osteoporosis    Palpitation    Vitamin D deficiency     Past Surgical History:  Procedure Laterality Date   CATARACT EXTRACTION  05/31/2015   Eye/right    COLONOSCOPY  02/05/2005, 03/06/2005   normal, 10 years ago   MOHS micrographic surgery     Face surgery     reports that he has never smoked. He has never used smokeless tobacco. He reports that he does not drink alcohol and does not use drugs.  No Known Allergies  Family History  Problem Relation Age of Onset   Prostate cancer Father    Skin cancer Brother      Prior to Admission medications   Medication Sig Start Date End Date Taking? Authorizing Provider  Calcium Carbonate-Vitamin D (CALCIUM-VITAMIN D3 PO) Take by mouth.    [provider]  carbidopa-levodopa (SINEMET CR) 50-200 MG tablet Take 1 tablet by mouth at bedtime. 08/07/20   [name], Octaviano Batty, DO  carbidopa-levodopa (SINEMET IR) 25-100 MG tablet Take 1 tablet by mouth 4 (four) times daily. 08/07/20   [name], Octaviano Batty, DO  Multiple Vitamins-Minerals [facility] FORMULA) CAPS Take by mouth.    [provider]    Physical Exam: Vitals:   10/08/20 1700 10/08/20 1715 10/08/20 1730 10/08/20 1745  BP: (!) 176/99 (!) 145/86 (!) 163/84 (!) 157/90  Pulse: 87 86 88 86  Resp: (!) 23 20 18 19   Temp:      TempSrc:      SpO2: 97% 96% 97% 97%    Constitutional: NAD, calm, comfortable Vitals:   10/08/20 1700 10/08/20 1715 10/08/20 1730 10/08/20  1745  BP: (!) 176/99 (!) 145/86 (!) 163/84 (!) 157/90  Pulse: 87 86 88 86  Resp: (!) 23 20 18 19   Temp:      TempSrc:      SpO2: 97% 96% 97% 97%   Eyes: PERRL, lids and conjunctivae normal ENMT: Mucous membranes are moist. Posterior pharynx clear of any exudate or lesions.Normal dentition.  Neck: normal, supple, no masses, no thyromegaly Respiratory: clear to auscultation bilaterally, no wheezing, no crackles. Normal respiratory effort. No accessory muscle use.  Cardiovascular: Regular rate and rhythm, diminished S1, soft systolic murmur heart base. No extremity edema. 2+ pedal pulses. No carotid bruits.  Abdomen: no tenderness, no masses palpated. No  hepatosplenomegaly. Bowel sounds positive.  Musculoskeletal: no clubbing / cyanosis. No joint deformity upper and lower extremities. Good ROM, no contractures. Normal muscle tone.  Skin: no rashes, lesions, ulcers. No induration Neurologic: CN 2-12 grossly intact. Sensation intact, DTR normal. Strength 5/5 in all 4.  Psychiatric: Normal judgment and insight. Alert and oriented x 3. Normal mood.     Labs on Admission: I have personally reviewed following labs and imaging studies  CBC: Recent Labs  Lab 10/08/20 1722  WBC 11.7*  NEUTROABS 10.2*  HGB 13.2  HCT 39.5  MCV 97.8  PLT 160   Basic Metabolic Panel: Recent Labs  Lab 10/08/20 1722  NA 134*  K 4.5  CL 100  CO2 26  GLUCOSE 139*  BUN 27*  CREATININE 1.17  CALCIUM 8.8*   GFR: Estimated Creatinine Clearance: 48.4 mL/min (by C-G formula based on SCr of 1.17 mg/dL). Liver Function Tests: No results for input(s): AST, ALT, ALKPHOS, BILITOT, PROT, ALBUMIN in the last 168 hours. No results for input(s): LIPASE, AMYLASE in the last 168 hours. No results for input(s): AMMONIA in the last 168 hours. Coagulation Profile: No results for input(s): INR, PROTIME in the last 168 hours. Cardiac Enzymes: No results for input(s): CKTOTAL, CKMB, CKMBINDEX, TROPONINI in the last 168 hours. BNP (last 3 results) No results for input(s): PROBNP in the last 8760 hours. HbA1C: No results for input(s): HGBA1C in the last 72 hours. CBG: No results for input(s): GLUCAP in the last 168 hours. Lipid Profile: No results for input(s): CHOL, HDL, LDLCALC, TRIG, CHOLHDL, LDLDIRECT in the last 72 hours. Thyroid Function Tests: No results for input(s): TSH, T4TOTAL, FREET4, T3FREE, THYROIDAB in the last 72 hours. Anemia Panel: No results for input(s): VITAMINB12, FOLATE, FERRITIN, TIBC, IRON, RETICCTPCT in the last 72 hours. Urine analysis: No results found for: COLORURINE, APPEARANCEUR, LABSPEC, PHURINE, GLUCOSEU, HGBUR, BILIRUBINUR, KETONESUR,  PROTEINUR, UROBILINOGEN, NITRITE, LEUKOCYTESUR  Radiological Exams on Admission: CT PELVIS WO CONTRAST  Result Date: 10/08/2020 CLINICAL DATA:  Status post fall.  Right acetabular fracture. EXAM: CT PELVIS WITHOUT CONTRAST TECHNIQUE: Multidetector CT imaging of the pelvis was performed following the standard protocol without intravenous contrast. COMPARISON:  Hip films from earlier the same day FINDINGS: Urinary Tract:  Bladder is moderately distended. Bowel:  Unremarkable. Vascular/Lymphatic: Atherosclerotic calcification noted lower abdominal aorta and iliac arteries. Reproductive:  Prostate gland is enlarged. Other:  None. Musculoskeletal: Severely comminuted right acetabular fracture identified with fracture fragments distracted by at least 15 mm. Fracture involves the roof of the acetabulum, medial acetabulum and posterior lip of the acetabulum. Fracture extends cranially up into the iliac wing and anteriorly to involve the right superior pubic ramus. Associated comminuted fracture noted inferior right pubic ramus. Soft tissue windows demonstrate extensive hemorrhage in the extraperitoneal pelvic sidewall extending into the presacral space  and right groin region. IMPRESSION: Severely comminuted right acetabular fracture involving the roof of the acetabulum, medial acetabulum and posterior lip. Fracture extends anteriorly into the superior pubic ramus. Associated comminuted fracture noted right inferior pubic ramus. Extensive hemorrhage in the soft tissues adjacent to the right hip, involving the extraperitoneal right pelvic sidewall. Electronically Signed   By: Kennith Center M.D.   On: 10/08/2020 18:30   DG Hip Unilat With Pelvis 2-3 Views Right  Result Date: 10/08/2020 CLINICAL DATA:  RIGHT hip pain status post fall EXAM: DG HIP (WITH OR WITHOUT PELVIS) 2-3V RIGHT COMPARISON:  None. FINDINGS: There is protrusion of the RIGHT femoral head through the medial border of the acetabulum. There is  discontinuity of the iliopectineal line on the RIGHT. No RIGHT femoral neck fracture. IMPRESSION: Fracture through the medial wall of the RIGHT acetabulum with protrusion of the femoral head. Electronically Signed   By: Genevive Bi M.D.   On: 10/08/2020 17:17    EKG: Ordered  Assessment/Plan Active Problems:   Closed right hip fracture (HCC)  (please populate well all problems here in Problem List. (For example, if patient is on BP meds at home and you resume or decide to hold them, it is a problem that needs to be her. Same for CAD, COPD, HLD and so on)  Right hip acetabulum fracture -Secondary to mechanical fall. -ORIF tomorrow, n.p.o. after midnight. -Baseline active, tolerate >4 METs activity, no history of MI, CHF, stroke, diabetes, medically cleared for tomorrow's ORIF with acceptable risk. -Starting beta-blocker of Coreg -Postpone chemical DVT prophylaxis after hip surgery.  SCD now. -CT head as patient unsure about whether he hit his head or not when fell down.  Elevated BP without diagnosis of HTN -Might related to the stress from hip fracture -Start PRN Hydralazine  Elevated Glucose -No Hx of DM in his past, might be stress related -Check A1C.  Heart murmur -Denied any history of chest pain, dyspnea or syncope.  Suspect degenerative valvular disease and aortic valve, recommend outpatient echo and cardiology follow-up.  Explained to patient and son at bedside.  Parkinson's disease -Continue home regimen of Sinemet.  Osteoporosis -Continue vitamin D and calcium supplement.  DVT prophylaxis: SCD Code Status: Full Code Family Communication: Son at bedside Disposition Plan: Expect more than 2 midnight hospital stay Consults called: Orthopedic surgery Admission status: Medsurg Admit   Emeline General MD [facility] Pager [phone]  10/08/2020, 6:57 PM

## 2020-10-08 NOTE — ED Notes (Signed)
Patient transported to CT 

## 2020-10-09 ENCOUNTER — Encounter (HOSPITAL_COMMUNITY): Payer: Self-pay | Admitting: Internal Medicine

## 2020-10-09 ENCOUNTER — Inpatient Hospital Stay (HOSPITAL_COMMUNITY): Payer: Medicare Other | Admitting: Anesthesiology

## 2020-10-09 ENCOUNTER — Inpatient Hospital Stay (HOSPITAL_COMMUNITY): Payer: Medicare Other

## 2020-10-09 ENCOUNTER — Encounter (HOSPITAL_COMMUNITY)
Admission: EM | Disposition: A | Discharge status: Skilled Nursing Facility | Payer: Self-pay | Source: Home / Self Care | Attending: Family Medicine

## 2020-10-09 DIAGNOSIS — S32401A Unspecified fracture of right acetabulum, initial encounter for closed fracture: Secondary | ICD-10-CM

## 2020-10-09 HISTORY — PX: ORIF ACETABULAR FRACTURE: SHX5029

## 2020-10-09 LAB — COMPREHENSIVE METABOLIC PANEL
ALT: 7 U/L (ref 0–44)
AST: 22 U/L (ref 15–41)
Albumin: 3.4 g/dL — ABNORMAL LOW (ref 3.5–5.0)
Alkaline Phosphatase: 31 U/L — ABNORMAL LOW (ref 38–126)
Anion gap: 8 (ref 5–15)
BUN: 22 mg/dL (ref 8–23)
CO2: 28 mmol/L (ref 22–32)
Calcium: 9.2 mg/dL (ref 8.9–10.3)
Chloride: 96 mmol/L — ABNORMAL LOW (ref 98–111)
Creatinine, Ser: 1.08 mg/dL (ref 0.61–1.24)
GFR, Estimated: >60 mL/min (ref >60)
Glucose, Bld: 124 mg/dL — ABNORMAL HIGH (ref 70–99)
Potassium: 4.5 mmol/L (ref 3.5–5.1)
Sodium: 132 mmol/L — ABNORMAL LOW (ref 135–145)
Total Bilirubin: 1.7 mg/dL — ABNORMAL HIGH (ref 0.3–1.2)
Total Protein: 5.9 g/dL — ABNORMAL LOW (ref 6.5–8.1)

## 2020-10-09 LAB — ABO/RH: ABO/RH(D): A POS

## 2020-10-09 LAB — VITAMIN D 25 HYDROXY (VIT D DEFICIENCY, FRACTURES): Vit D, 25-Hydroxy: 30.55 ng/mL (ref 30–100)

## 2020-10-09 LAB — URINALYSIS, ROUTINE W REFLEX MICROSCOPIC
Bacteria, UA: NONE SEEN
Bilirubin Urine: NEGATIVE
Glucose, UA: NEGATIVE mg/dL
Ketones, ur: 5 mg/dL — AB
Leukocytes,Ua: NEGATIVE
Nitrite: NEGATIVE
Protein, ur: NEGATIVE mg/dL
RBC / HPF: >50 RBC/hpf — ABNORMAL HIGH (ref 0–5)
Specific Gravity, Urine: 1.025 (ref 1.005–1.030)
pH: 5 (ref 5.0–8.0)

## 2020-10-09 LAB — TYPE AND SCREEN
ABO/RH(D): A POS
Antibody Screen: NEGATIVE

## 2020-10-09 LAB — CBC
HCT: 34.8 % — ABNORMAL LOW (ref 39.0–52.0)
Hemoglobin: 11.8 g/dL — ABNORMAL LOW (ref 13.0–17.0)
MCH: 32.4 pg (ref 26.0–34.0)
MCHC: 33.9 g/dL (ref 30.0–36.0)
MCV: 95.6 fL (ref 80.0–100.0)
Platelets: 130 10*3/uL — ABNORMAL LOW (ref 150–400)
RBC: 3.64 MIL/uL — ABNORMAL LOW (ref 4.22–5.81)
RDW: 13.6 % (ref 11.5–15.5)
WBC: 11.6 10*3/uL — ABNORMAL HIGH (ref 4.0–10.5)
nRBC: 0 % (ref 0.0–0.2)

## 2020-10-09 LAB — PHOSPHORUS: Phosphorus: 3.4 mg/dL (ref 2.5–4.6)

## 2020-10-09 LAB — HEMOGLOBIN AND HEMATOCRIT, BLOOD
HCT: 36 % — ABNORMAL LOW (ref 39.0–52.0)
HCT: 30 % — ABNORMAL LOW (ref 39.0–52.0)
Hemoglobin: 12.2 g/dL — ABNORMAL LOW (ref 13.0–17.0)
Hemoglobin: 10.3 g/dL — ABNORMAL LOW (ref 13.0–17.0)

## 2020-10-09 LAB — SURGICAL PCR SCREEN
MRSA, PCR: NEGATIVE
Staphylococcus aureus: NEGATIVE

## 2020-10-09 LAB — PROTIME-INR
INR: 1.1 (ref 0.8–1.2)
Prothrombin Time: 13.9 seconds (ref 11.4–15.2)

## 2020-10-09 SURGERY — OPEN REDUCTION INTERNAL FIXATION (ORIF) ACETABULAR FRACTURE
Anesthesia: General | Site: Pelvis | Laterality: Right

## 2020-10-09 MED ORDER — PROPOFOL 10 MG/ML IV BOLUS
INTRAVENOUS | Status: AC
  Filled 2020-10-09: qty 20

## 2020-10-09 MED ORDER — FENTANYL CITRATE (PF) 100 MCG/2ML IJ SOLN: 25.0000 ug | INTRAMUSCULAR | Status: DC | PRN

## 2020-10-09 MED ORDER — ALBUMIN HUMAN 5 % IV SOLN: INTRAVENOUS | Status: DC | PRN

## 2020-10-09 MED ORDER — ACETAMINOPHEN 160 MG/5ML PO SOLN: 325.0000 mg | ORAL | Status: DC | PRN

## 2020-10-09 MED ORDER — CHLORHEXIDINE GLUCONATE 0.12 % MT SOLN
OROMUCOSAL | Status: AC
  Administered 2020-10-09: 15 mL via OROMUCOSAL
  Filled 2020-10-09: qty 15

## 2020-10-09 MED ORDER — MEPERIDINE HCL 25 MG/ML IJ SOLN: 6.2500 mg | INTRAMUSCULAR | Status: DC | PRN

## 2020-10-09 MED ORDER — DEXAMETHASONE SODIUM PHOSPHATE 10 MG/ML IJ SOLN
INTRAMUSCULAR | Status: DC | PRN
  Administered 2020-10-09: 10 mg via INTRAVENOUS

## 2020-10-09 MED ORDER — ROCURONIUM BROMIDE 10 MG/ML (PF) SYRINGE
PREFILLED_SYRINGE | INTRAVENOUS | Status: AC
  Filled 2020-10-09: qty 10

## 2020-10-09 MED ORDER — CHLORHEXIDINE GLUCONATE 0.12 % MT SOLN: 15.0000 mL | Freq: Once | OROMUCOSAL | Status: AC

## 2020-10-09 MED ORDER — METOCLOPRAMIDE HCL 5 MG/ML IJ SOLN: 5.0000 mg | Freq: Three times a day (TID) | INTRAMUSCULAR | Status: DC | PRN

## 2020-10-09 MED ORDER — TRANEXAMIC ACID-NACL 1000-0.7 MG/100ML-% IV SOLN
INTRAVENOUS | Status: AC
  Filled 2020-10-09: qty 100

## 2020-10-09 MED ORDER — ONDANSETRON HCL 4 MG PO TABS: 4.0000 mg | ORAL_TABLET | Freq: Four times a day (QID) | ORAL | Status: DC | PRN

## 2020-10-09 MED ORDER — ONDANSETRON HCL 4 MG/2ML IJ SOLN
INTRAMUSCULAR | Status: DC | PRN
  Administered 2020-10-09: 4 mg via INTRAVENOUS

## 2020-10-09 MED ORDER — FENTANYL CITRATE (PF) 250 MCG/5ML IJ SOLN
INTRAMUSCULAR | Status: DC | PRN
  Administered 2020-10-09 (×4): 25 ug via INTRAVENOUS

## 2020-10-09 MED ORDER — PHENYLEPHRINE HCL-NACL 10-0.9 MG/250ML-% IV SOLN
INTRAVENOUS | Status: DC | PRN
  Administered 2020-10-09: 25 ug/min via INTRAVENOUS

## 2020-10-09 MED ORDER — MORPHINE SULFATE (PF) 2 MG/ML IV SOLN: 0.5000 mg | INTRAVENOUS | Status: DC | PRN

## 2020-10-09 MED ORDER — ACETAMINOPHEN 325 MG PO TABS: 325.0000 mg | ORAL_TABLET | Freq: Four times a day (QID) | ORAL | Status: DC | PRN

## 2020-10-09 MED ORDER — ACETAMINOPHEN 325 MG PO TABS
650.0000 mg | ORAL_TABLET | Freq: Two times a day (BID) | ORAL | Status: AC
  Administered 2020-10-09 – 2020-10-11 (×4): 650 mg via ORAL
  Filled 2020-10-09 (×4): qty 2

## 2020-10-09 MED ORDER — DOCUSATE SODIUM 100 MG PO CAPS
100.0000 mg | ORAL_CAPSULE | Freq: Two times a day (BID) | ORAL | Status: DC
  Administered 2020-10-09 – 2020-10-12 (×6): 100 mg via ORAL
  Filled 2020-10-09 (×6): qty 1

## 2020-10-09 MED ORDER — HYDROCODONE-ACETAMINOPHEN 5-325 MG PO TABS
1.0000 | ORAL_TABLET | Freq: Four times a day (QID) | ORAL | Status: DC | PRN
Start: 2020-10-09 — End: 2020-10-13
  Administered 2020-10-10: 1 via ORAL
  Filled 2020-10-09: qty 1

## 2020-10-09 MED ORDER — ENSURE ENLIVE PO LIQD
237.0000 mL | ORAL | Status: DC
  Administered 2020-10-09 – 2020-10-11 (×3): 237 mL via ORAL

## 2020-10-09 MED ORDER — CEFAZOLIN SODIUM-DEXTROSE 2-4 GM/100ML-% IV SOLN
2.0000 g | INTRAVENOUS | Status: AC
  Administered 2020-10-09: 2 g via INTRAVENOUS

## 2020-10-09 MED ORDER — FENTANYL CITRATE (PF) 250 MCG/5ML IJ SOLN
INTRAMUSCULAR | Status: AC
  Filled 2020-10-09: qty 5

## 2020-10-09 MED ORDER — DEXAMETHASONE SODIUM PHOSPHATE 10 MG/ML IJ SOLN
INTRAMUSCULAR | Status: AC
  Filled 2020-10-09: qty 1

## 2020-10-09 MED ORDER — ONDANSETRON HCL 4 MG/2ML IJ SOLN
INTRAMUSCULAR | Status: AC
  Filled 2020-10-09: qty 2

## 2020-10-09 MED ORDER — OXYCODONE HCL 5 MG PO TABS
5.0000 mg | ORAL_TABLET | Freq: Once | ORAL | Status: DC | PRN
Start: 2020-10-09 — End: 2020-10-09

## 2020-10-09 MED ORDER — OXYCODONE HCL 5 MG/5ML PO SOLN: 5.0000 mg | Freq: Once | ORAL | Status: DC | PRN

## 2020-10-09 MED ORDER — ACETAMINOPHEN 325 MG PO TABS: 325.0000 mg | ORAL_TABLET | ORAL | Status: DC | PRN

## 2020-10-09 MED ORDER — METOCLOPRAMIDE HCL 5 MG PO TABS: 5.0000 mg | ORAL_TABLET | Freq: Three times a day (TID) | ORAL | Status: DC | PRN

## 2020-10-09 MED ORDER — TRANEXAMIC ACID-NACL 1000-0.7 MG/100ML-% IV SOLN
INTRAVENOUS | Status: DC | PRN
  Administered 2020-10-09: 1000 mg via INTRAVENOUS

## 2020-10-09 MED ORDER — ADULT MULTIVITAMIN W/MINERALS CH
1.0000 | ORAL_TABLET | Freq: Every day | ORAL | Status: DC
  Administered 2020-10-09 – 2020-10-12 (×4): 1 via ORAL
  Filled 2020-10-09 (×4): qty 1

## 2020-10-09 MED ORDER — PROPOFOL 10 MG/ML IV BOLUS
INTRAVENOUS | Status: DC | PRN
  Administered 2020-10-09: 60 mg via INTRAVENOUS

## 2020-10-09 MED ORDER — SUGAMMADEX SODIUM 200 MG/2ML IV SOLN
INTRAVENOUS | Status: DC | PRN
  Administered 2020-10-09: 200 mg via INTRAVENOUS

## 2020-10-09 MED ORDER — SODIUM CHLORIDE 0.9 % IR SOLN
Status: DC | PRN
  Administered 2020-10-09: 3000 mL

## 2020-10-09 MED ORDER — 0.9 % SODIUM CHLORIDE (POUR BTL) OPTIME
TOPICAL | Status: DC | PRN
  Administered 2020-10-09: 1000 mL

## 2020-10-09 MED ORDER — ONDANSETRON HCL 4 MG/2ML IJ SOLN: 4.0000 mg | Freq: Four times a day (QID) | INTRAMUSCULAR | Status: DC | PRN

## 2020-10-09 MED ORDER — ROCURONIUM BROMIDE 10 MG/ML (PF) SYRINGE
PREFILLED_SYRINGE | INTRAVENOUS | Status: DC | PRN
  Administered 2020-10-09 (×3): 20 mg via INTRAVENOUS
  Administered 2020-10-09: 60 mg via INTRAVENOUS

## 2020-10-09 MED ORDER — ORAL CARE MOUTH RINSE: 15.0000 mL | Freq: Once | OROMUCOSAL | Status: AC

## 2020-10-09 MED ORDER — CEFAZOLIN SODIUM-DEXTROSE 2-4 GM/100ML-% IV SOLN
2.0000 g | Freq: Four times a day (QID) | INTRAVENOUS | Status: AC
Start: 2020-10-09 — End: 2020-10-10
  Administered 2020-10-10 (×2): 2 g via INTRAVENOUS
  Filled 2020-10-09 (×3): qty 100

## 2020-10-09 MED ORDER — LACTATED RINGERS IV SOLN: INTRAVENOUS | Status: DC

## 2020-10-09 MED ORDER — ONDANSETRON HCL 4 MG/2ML IJ SOLN: 4.0000 mg | Freq: Once | INTRAMUSCULAR | Status: DC | PRN

## 2020-10-09 MED ORDER — LIDOCAINE 2% (20 MG/ML) 5 ML SYRINGE
INTRAMUSCULAR | Status: DC | PRN
  Administered 2020-10-09: 80 mg via INTRAVENOUS

## 2020-10-09 MED ORDER — CEFAZOLIN SODIUM-DEXTROSE 2-4 GM/100ML-% IV SOLN
INTRAVENOUS | Status: AC
  Administered 2020-10-09: 2 g via INTRAVENOUS
  Filled 2020-10-09: qty 100

## 2020-10-09 MED ORDER — LIDOCAINE 2% (20 MG/ML) 5 ML SYRINGE
INTRAMUSCULAR | Status: AC
  Filled 2020-10-09: qty 5

## 2020-10-09 SURGICAL SUPPLY — 75 items
APPLIER CLIP 11 MED OPEN (CLIP) ×2
APR CLP MED 11 20 MLT OPN (CLIP) ×1
BAG COUNTER SPONGE SURGICOUNT (BAG) ×2 IMPLANT
BAG SPNG CNTER NS LX DISP (BAG) ×1
BIT DRILL AO MATTA 2.5MX230M (BIT) ×1 IMPLANT
BIT DRILL STEP 3.5 (DRILL) IMPLANT
BLADE CLIPPER SURG (BLADE) IMPLANT
BRUSH SCRUB EZ PLAIN DRY (MISCELLANEOUS) ×4 IMPLANT
CLIP APPLIE 11 MED OPEN (CLIP) ×1 IMPLANT
COVER SURGICAL LIGHT HANDLE (MISCELLANEOUS) ×2 IMPLANT
DRAPE C-ARM 42X72 X-RAY (DRAPES) ×2 IMPLANT
DRAPE C-ARMOR (DRAPES) ×2 IMPLANT
DRAPE INCISE IOBAN 66X45 STRL (DRAPES) ×2 IMPLANT
DRAPE INCISE IOBAN 85X60 (DRAPES) ×2 IMPLANT
DRAPE ORTHO SPLIT 77X108 STRL (DRAPES) ×4
DRAPE SURG ORHT 6 SPLT 77X108 (DRAPES) ×2 IMPLANT
DRAPE U-SHAPE 47X51 STRL (DRAPES) ×2 IMPLANT
DRESSING MEPILEX FLEX 4X4 (GAUZE/BANDAGES/DRESSINGS) ×1 IMPLANT
DRILL BIT AO MATTA 2.5MX230M (BIT) ×2
DRILL STEP 3.5 (DRILL)
DRSG MEPILEX BORDER 4X12 (GAUZE/BANDAGES/DRESSINGS) IMPLANT
DRSG MEPILEX BORDER 4X8 (GAUZE/BANDAGES/DRESSINGS) ×2 IMPLANT
DRSG MEPILEX FLEX 4X4 (GAUZE/BANDAGES/DRESSINGS) ×2
ELECT BLADE 6.5 EXT (BLADE) ×2 IMPLANT
ELECT REM PT RETURN 9FT ADLT (ELECTROSURGICAL) ×2
ELECTRODE REM PT RTRN 9FT ADLT (ELECTROSURGICAL) ×1 IMPLANT
GLOVE SRG 8 PF TXTR STRL LF DI (GLOVE) ×1 IMPLANT
GLOVE SURG ENC MOIS LTX SZ7.5 (GLOVE) ×2 IMPLANT
GLOVE SURG ENC MOIS LTX SZ8 (GLOVE) ×2 IMPLANT
GLOVE SURG UNDER POLY LF SZ7.5 (GLOVE) ×2 IMPLANT
GLOVE SURG UNDER POLY LF SZ8 (GLOVE) ×2
GOWN STRL REUS W/ TWL LRG LVL3 (GOWN DISPOSABLE) ×2 IMPLANT
GOWN STRL REUS W/ TWL XL LVL3 (GOWN DISPOSABLE) ×2 IMPLANT
GOWN STRL REUS W/TWL LRG LVL3 (GOWN DISPOSABLE) ×4
GOWN STRL REUS W/TWL XL LVL3 (GOWN DISPOSABLE) ×4
HANDPIECE INTERPULSE COAX TIP (DISPOSABLE) ×2
KIT BASIN OR (CUSTOM PROCEDURE TRAY) ×2 IMPLANT
KIT TURNOVER KIT B (KITS) ×2 IMPLANT
MANIFOLD NEPTUNE II (INSTRUMENTS) ×2 IMPLANT
NS IRRIG 1000ML POUR BTL (IV SOLUTION) ×2 IMPLANT
PACK TOTAL JOINT (CUSTOM PROCEDURE TRAY) ×2 IMPLANT
PAD ARMBOARD 7.5X6 YLW CONV (MISCELLANEOUS) ×4 IMPLANT
PIN APEX 180X50X5XST SLF (PIN) ×1 IMPLANT
PIN APEX 5X180 (PIN) ×2
PLATE SUPRAPECTINEAL PELVIS (Plate) ×2 IMPLANT
RETRIEVER SUT HEWSON (MISCELLANEOUS) ×2 IMPLANT
SCREW CORTEX ST MATTA 3.5X34MM (Screw) ×2 IMPLANT
SCREW CORTEX ST MATTA 3.5X40MM (Screw) ×4 IMPLANT
SCREW CORTEX ST MATTA 3.5X55MM (Screw) ×4 IMPLANT
SCREW CORTEX ST MATTA 3.5X60MM (Screw) ×4 IMPLANT
SCREW CORTICAL 3.5X42MM (Screw) ×4 IMPLANT
SET HNDPC FAN SPRY TIP SCT (DISPOSABLE) ×1 IMPLANT
SPONGE T-LAP 18X18 ~~LOC~~+RFID (SPONGE) IMPLANT
STAPLER VISISTAT 35W (STAPLE) ×2 IMPLANT
STRIP CLOSURE SKIN 1/2X4 (GAUZE/BANDAGES/DRESSINGS) IMPLANT
SUCTION FRAZIER HANDLE 10FR (MISCELLANEOUS) ×1
SUCTION TUBE FRAZIER 10FR DISP (MISCELLANEOUS) ×1 IMPLANT
SUT ETHILON 2 0 PSLX (SUTURE) ×2 IMPLANT
SUT FIBERWIRE #2 38 T-5 BLUE (SUTURE) ×4
SUT SILK 0 TIES 10X30 (SUTURE) ×2 IMPLANT
SUT SILK 2 0 (SUTURE) ×2
SUT SILK 2-0 18XBRD TIE 12 (SUTURE) ×1 IMPLANT
SUT VIC AB 0 CT1 27 (SUTURE) ×2
SUT VIC AB 0 CT1 27XBRD ANBCTR (SUTURE) ×1 IMPLANT
SUT VIC AB 1 CT1 18XCR BRD 8 (SUTURE) ×1 IMPLANT
SUT VIC AB 1 CT1 27 (SUTURE) ×2
SUT VIC AB 1 CT1 27XBRD ANBCTR (SUTURE) ×1 IMPLANT
SUT VIC AB 1 CT1 8-18 (SUTURE) ×2
SUT VIC AB 2-0 CT1 27 (SUTURE) ×2
SUT VIC AB 2-0 CT1 TAPERPNT 27 (SUTURE) ×1 IMPLANT
SUTURE FIBERWR #2 38 T-5 BLUE (SUTURE) ×2 IMPLANT
TOWEL GREEN STERILE (TOWEL DISPOSABLE) ×4 IMPLANT
TOWEL GREEN STERILE FF (TOWEL DISPOSABLE) ×4 IMPLANT
TRAY FOLEY MTR SLVR 16FR STAT (SET/KITS/TRAYS/PACK) ×2 IMPLANT
WATER STERILE IRR 1000ML POUR (IV SOLUTION) IMPLANT

## 2020-10-09 NOTE — Progress Notes (Signed)
PROGRESS NOTE    Greg [name]  KGY:[id number] DOB: [birth year] DOA: 10/08/2020 PCP: [name], MD   Chief Complaint  Patient presents with   Fall    Brief Narrative: Greg [name] is Greg [name] 87 y.o. male with medical history significant of Parkinson's disease poorly controlled, osteoporosis, presented with mechanical fall and hip fracture.   Assessment & Plan:   Active Problems:   Closed right hip fracture (HCC)  Right Acetabular Fracture  Mechanical Fall CT notable for severely comminuted R acetabular fx involving the roof of the acetabulum, medial acetabulum and posterior lip.  Fracture extends anteriorly into the superior pubic ramus.  Associated comminuted fracture noted R inferior pubic ramus.  Extensive hemorrhage in the soft tissues adjacent to the R hip involving the extraperitoneal R pelvic sidewall S/p ORIF with ortho 7/5, op note pending APAP, norco prn, dilaudid prn TDWB R leg x 8 weeks, posterior hip precautions PT/OT Lovenox post op for dvt ppx (per ortho, plan for trnaisiton to xarelto for 4-6 wks Head CT without acute intracranial abnormalities  Elevated BP without diagnosis of HTN -coreg started on admission, follow BP with pain management post op   Elevated Glucose -No Hx of DM in his past, might be stress related -Check A1C 5.9   Heart murmur -Denied any history of chest pain, dyspnea or syncope.  Suspect degenerative valvular disease and aortic valve, recommend outpatient echo and cardiology follow-up.  Explained to patient and son at bedside.   Parkinson's disease -Continue home regimen of Sinemet.   Osteoporosis -Continue vitamin D and calcium supplement.  DVT prophylaxis: lovenox Code Status: full  Family Communication: none at bedside Disposition:   Status is: Inpatient  Remains inpatient appropriate because:Inpatient level of care appropriate due to severity of illness  Dispo: The patient is from:  home              Anticipated d/c is to:  SNF              Patient currently is not medically stable to d/c.   Difficult to place patient No       Consultants:  orthopedics  Procedures:  S/p surgery with ortho 7/5, op note pending  Antimicrobials:  Anti-infectives (From admission, onward)    Start     Dose/Rate Route Frequency Ordered Stop   10/09/20 1800  ceFAZolin (ANCEF) IVPB 2g/100 mL premix        2 g 200 mL/hr over 30 Minutes Intravenous Every 6 hours 10/09/20 1605 10/10/20 1159   10/09/20 1100  ceFAZolin (ANCEF) IVPB 2g/100 mL premix        2 g 200 mL/hr over 30 Minutes Intravenous On call to O.R. 10/09/20 1046 10/09/20 1156   10/09/20 1056  ceFAZolin (ANCEF) 2-4 GM/100ML-% IVPB       Note to Pharmacy: Clovis Cao   : cabinet override      10/09/20 1056 10/09/20 1157          Subjective: C/o pain with coughing post op   Objective: Vitals:   10/09/20 1510 10/09/20 1540 10/09/20 1612 10/09/20 1724  BP: (!) 154/85 (!) 149/70 (!) 151/64 140/80  Pulse: (!) 104 99 96 98  Resp: (!) 21 19    Temp: (!) 97.3 F (36.3 C)  98.8 F (37.1 C) 98.5 F (36.9 C)  TempSrc:   Axillary Axillary  SpO2: 94% 93% 94% 93%    Intake/Output Summary (Last 24 hours) at 10/09/2020 1810 Last data filed at 10/09/2020 1500 Gross per  24 hour  Intake 2050 ml  Output 925 ml  Net 1125 ml   There were no vitals filed for this visit.  Examination:  General exam: Appears calm and comfortable  Respiratory system: Clear to auscultation. Respiratory effort normal. Cardiovascular system: S1 & S2 heard, RRR Gastrointestinal system: Abdomen is nondistended, soft and nontender Central nervous system: Alert and oriented, sleeping, wakes easily. No focal neurological deficits. Extremities: dressing to R hip  Skin: No rashes, lesions or ulcers     Data Reviewed: I have personally reviewed following labs and imaging studies  CBC: Recent Labs  Lab 10/08/20 1722 10/09/20 0211 10/09/20 0546  WBC 11.7*  --  11.6*  NEUTROABS  10.2*  --   --   HGB 13.2 12.2* 11.8*  HCT 39.5 36.0* 34.8*  MCV 97.8  --  95.6  PLT 160  --  130*    Basic Metabolic Panel: Recent Labs  Lab 10/08/20 1722 10/09/20 0546 10/09/20 0937  NA 134*  --  132*  K 4.5  --  4.5  CL 100  --  96*  CO2 26  --  28  GLUCOSE 139*  --  124*  BUN 27*  --  22  CREATININE 1.17  --  1.08  CALCIUM 8.8*  --  9.2  PHOS  --  3.4  --     GFR: Estimated Creatinine Clearance: 52.5 mL/min (by C-G formula based on SCr of 1.08 mg/dL).  Liver Function Tests: Recent Labs  Lab 10/09/20 0937  AST 22  ALT 7  ALKPHOS 31*  BILITOT 1.7*  PROT 5.9*  ALBUMIN 3.4*    CBG: No results for input(s): GLUCAP in the last 168 hours.   Recent Results (from the past 240 hour(s))  Resp Panel by RT-PCR (Flu Hau Sanor&B, Covid) Nasopharyngeal Swab     Status: None   Collection Time: 10/08/20  6:42 PM   Specimen: Nasopharyngeal Swab; Nasopharyngeal(NP) swabs in vial transport medium  Result Value Ref Range Status   SARS Coronavirus 2 by RT PCR NEGATIVE NEGATIVE Final    Comment: (NOTE) SARS-CoV-2 target nucleic acids are NOT DETECTED.  The SARS-CoV-2 RNA is generally detectable in upper respiratory specimens during the acute phase of infection. The lowest concentration of SARS-CoV-2 viral copies this assay can detect is 138 copies/mL. Greg [name] negative result does not preclude SARS-Cov-2 infection and should not be used as the sole basis for treatment or other patient management decisions. Greg [name] negative result may occur with  improper specimen collection/handling, submission of specimen other than nasopharyngeal swab, presence of viral mutation(s) within the areas targeted by this assay, and inadequate number of viral copies(<138 copies/mL). Greg [name] negative result must be combined with clinical observations, patient history, and epidemiological information. The expected result is Negative.  Fact Sheet for Patients:  BloggerCourse.comhttps://www.fda.gov/media/152166/download  Fact Sheet for  Healthcare Providers:  SeriousBroker.ithttps://www.fda.gov/media/152162/download  This test is no t yet approved or cleared by the [place] FDA and  has been authorized for detection and/or diagnosis of SARS-CoV-2 by FDA under an Emergency Use Authorization (EUA). This EUA will remain  in effect (meaning this test can be used) for the duration of the COVID-19 declaration under Section 564(b)(1) of the Act, 21 U.S.C.section 360bbb-3(b)(1), unless the authorization is terminated  or revoked sooner.       Influenza Nani Ingram by PCR NEGATIVE NEGATIVE Final   Influenza B by PCR NEGATIVE NEGATIVE Final    Comment: (NOTE) The Xpert Xpress SARS-CoV-2/FLU/RSV plus assay is intended as an aid in  the diagnosis of influenza from Nasopharyngeal swab specimens and should not be used as Greg [name] sole basis for treatment. Nasal washings and aspirates are unacceptable for Xpert Xpress SARS-CoV-2/FLU/RSV testing.  Fact Sheet for Patients: BloggerCourse.com  Fact Sheet for Healthcare Providers: SeriousBroker.it  This test is not yet approved or cleared by the [place] FDA and has been authorized for detection and/or diagnosis of SARS-CoV-2 by FDA under an Emergency Use Authorization (EUA). This EUA will remain in effect (meaning this test can be used) for the duration of the COVID-19 declaration under Section 564(b)(1) of the Act, 21 U.S.C. section 360bbb-3(b)(1), unless the authorization is terminated or revoked.  Performed at [facility], [address], [place], [place] [address]   Surgical pcr screen     Status: None   Collection Time: 10/09/20  4:06 AM   Specimen: Nasal Mucosa; Nasal Swab  Result Value Ref Range Status   MRSA, PCR NEGATIVE NEGATIVE Final   Staphylococcus aureus NEGATIVE NEGATIVE Final    Comment: (NOTE) The Xpert SA Assay (FDA approved for NASAL specimens in patients 6 years of age and older), is one component of Greg [name]  comprehensive surveillance program. It is not intended to diagnose infection nor to guide or monitor treatment. Performed at [facility], [address], [place], [place] [address]          Radiology Studies: CT HEAD WO CONTRAST  Result Date: 10/08/2020 CLINICAL DATA:  Head trauma from Greg [name] fall. EXAM: CT HEAD WITHOUT CONTRAST TECHNIQUE: Contiguous axial images were obtained from the base of the skull through the vertex without intravenous contrast. COMPARISON:  None. FINDINGS: Brain: No evidence of acute infarction, hemorrhage, hydrocephalus, extra-axial collection or mass lesion/mass effect. Mild cerebral atrophy. Patchy low-attenuation changes in the deep white matter consistent with small vessel ischemic change. Vascular: No hyperdense vessel or unexpected calcification. Skull: Calvarium appears intact. Sinuses/Orbits: Paranasal sinuses and mastoid air cells are clear. Other: None. IMPRESSION: No acute intracranial abnormalities. Chronic atrophy and small vessel ischemic changes. Electronically Signed   By: Burman Nieves M.D.   On: 10/08/2020 20:30   CT PELVIS WO CONTRAST  Result Date: 10/08/2020 CLINICAL DATA:  Status post fall.  Right acetabular fracture. EXAM: CT PELVIS WITHOUT CONTRAST TECHNIQUE: Multidetector CT imaging of the pelvis was performed following the standard protocol without intravenous contrast. COMPARISON:  Hip films from earlier the same day FINDINGS: Urinary Tract:  Bladder is moderately distended. Bowel:  Unremarkable. Vascular/Lymphatic: Atherosclerotic calcification noted lower abdominal aorta and iliac arteries. Reproductive:  Prostate gland is enlarged. Other:  None. Musculoskeletal: Severely comminuted right acetabular fracture identified with fracture fragments distracted by at least 15 mm. Fracture involves the roof of the acetabulum, medial acetabulum and posterior lip of the acetabulum. Fracture extends cranially up into the iliac wing and anteriorly to  involve the right superior pubic ramus. Associated comminuted fracture noted inferior right pubic ramus. Soft tissue windows demonstrate extensive hemorrhage in the extraperitoneal pelvic sidewall extending into the presacral space and right groin region. IMPRESSION: Severely comminuted right acetabular fracture involving the roof of the acetabulum, medial acetabulum and posterior lip. Fracture extends anteriorly into the superior pubic ramus. Associated comminuted fracture noted right inferior pubic ramus. Extensive hemorrhage in the soft tissues adjacent to the right hip, involving the extraperitoneal right pelvic sidewall. Electronically Signed   By: Kennith Center M.D.   On: 10/08/2020 18:30   DG Pelvis Comp Min 3V  Result Date: 10/09/2020 CLINICAL DATA:  Status post open reduction  and internal fixation of the pelvis. Obtained in PACU. EXAM: JUDET PELVIS - 3+ VIEW COMPARISON:  CT pelvis 10/08/2020 FINDINGS: Status post plate and screw fixation of Greg [name] right acetabular fracture. No new acute displaced fracture identified. No pelvic diastasis. No dislocation of bilateral hips. No severe arthropathy or aggressive appearing focal bone lesion. Vascular calcifications. IMPRESSION: Status post plate and screw fixation of Greg [name] right acetabular fracture. Electronically Signed   By: Tish Frederickson M.D.   On: 10/09/2020 16:04   DG Pelvis Comp Min 3V  Result Date: 10/09/2020 CLINICAL DATA:  Open reduction and internal fixation of right acetabular fracture. EXAM: DG C-ARM 1-60 MIN; JUDET PELVIS - 3+ VIEW CONTRAST:  None. FLUOROSCOPY TIME:  Radiation Exposure Index (if provided by the fluoroscopic device): 5.8 mGy. Number of Acquired Spot Images: 8. COMPARISON:  None. FINDINGS: Eight intraoperative fluoroscopic images were obtained of the right pelvis. These images demonstrate surgical reduction and internal fixation of right acetabular fracture. Improved alignment of fracture components is noted. IMPRESSION: Fluoroscopic  guidance provided during open reduction and internal fixation of right acetabular fracture. Electronically Signed   By: Lupita Raider M.D.   On: 10/09/2020 15:27   DG C-Arm 1-60 Min  Result Date: 10/09/2020 CLINICAL DATA:  Open reduction and internal fixation of right acetabular fracture. EXAM: DG C-ARM 1-60 MIN; JUDET PELVIS - 3+ VIEW CONTRAST:  None. FLUOROSCOPY TIME:  Radiation Exposure Index (if provided by the fluoroscopic device): 5.8 mGy. Number of Acquired Spot Images: 8. COMPARISON:  None. FINDINGS: Eight intraoperative fluoroscopic images were obtained of the right pelvis. These images demonstrate surgical reduction and internal fixation of right acetabular fracture. Improved alignment of fracture components is noted. IMPRESSION: Fluoroscopic guidance provided during open reduction and internal fixation of right acetabular fracture. Electronically Signed   By: Lupita Raider M.D.   On: 10/09/2020 15:27   DG Hip Unilat With Pelvis 2-3 Views Right  Result Date: 10/08/2020 CLINICAL DATA:  RIGHT hip pain status post fall EXAM: DG HIP (WITH OR WITHOUT PELVIS) 2-3V RIGHT COMPARISON:  None. FINDINGS: There is protrusion of the RIGHT femoral head through the medial border of the acetabulum. There is discontinuity of the iliopectineal line on the RIGHT. No RIGHT femoral neck fracture. IMPRESSION: Fracture through the medial wall of the RIGHT acetabulum with protrusion of the femoral head. Electronically Signed   By: Genevive Bi M.D.   On: 10/08/2020 17:17        Scheduled Meds:  acetaminophen  650 mg Oral Q12H   calcium-vitamin D  2 tablet Oral BID   carbidopa-levodopa  1 tablet Oral QHS   carbidopa-levodopa  1 tablet Oral QID   carvedilol  3.125 mg Oral BID WC   docusate sodium  100 mg Oral BID   enoxaparin (LOVENOX) injection  30 mg Subcutaneous Q24H   feeding supplement  237 mL Oral Q24H   multivitamin with minerals  1 tablet Oral Daily   Continuous Infusions:   ceFAZolin (ANCEF)  IV       LOS: 1 day    Time spent: over 30 min    Lacretia Nicks, MD [facility]   To contact the attending provider between 7A-7P or the covering provider during after hours 7P-7A, please log into the web site [email or web address] and access using universal [place] password for that web site. If you do not have the password, please call the hospital operator.  10/09/2020, 6:10 PM

## 2020-10-09 NOTE — ED Notes (Signed)
Call son Iantha Fallen) first with any updates or concerns.

## 2020-10-09 NOTE — Anesthesia Preprocedure Evaluation (Addendum)
Anesthesia Evaluation  Patient identified by MRN, date of birth, ID band Patient awake    Reviewed: Allergy & Precautions, H&P , NPO status , Patient's Chart, lab work & pertinent test results, reviewed documented beta blocker date and time   Airway Mallampati: III  TM Distance: >3 FB Neck ROM: full    Dental no notable dental hx. (+) Teeth Intact, Dental Advisory Given, Poor Dentition   Pulmonary neg pulmonary ROS,    Pulmonary exam normal breath sounds clear to auscultation       Cardiovascular Exercise Tolerance: Good + angina  Rhythm:regular Rate:Normal     Neuro/Psych  Neuromuscular disease negative psych ROS   GI/Hepatic negative GI ROS, Neg liver ROS,   Endo/Other  negative endocrine ROS  Renal/GU negative Renal ROS  negative genitourinary   Musculoskeletal  (+) Arthritis , Osteoarthritis,    Abdominal   Peds  Hematology  (+) Blood dyscrasia, anemia ,   Anesthesia Other Findings   Reproductive/Obstetrics negative OB ROS                            Anesthesia Physical Anesthesia Plan  ASA: 4  Anesthesia Plan: General   Post-op Pain Management:    Induction: Intravenous  PONV Risk Score and Plan: 2 and Ondansetron and Treatment may vary due to age or medical condition  Airway Management Planned: Oral ETT  Additional Equipment: None  Intra-op Plan:   Post-operative Plan: Extubation in OR  Informed Consent: I have reviewed the patients History and Physical, chart, labs and discussed the procedure including the risks, benefits and alternatives for the proposed anesthesia with the patient or authorized representative who has indicated his/her understanding and acceptance.     Dental Advisory Given  Plan Discussed with: CRNA and Anesthesiologist  Anesthesia Plan Comments: (  )        Anesthesia Quick Evaluation

## 2020-10-09 NOTE — Progress Notes (Signed)
Orthopedic Tech Progress Note Patient Details:  Greg [name] February 28, 1932 [id number]  Musculoskeletal Traction Type of Traction: [place] Skin Traction Traction Location: rle Traction Weight: 10 lbs   Post Interventions Patient Tolerated: Well Instructions Provided: Care of device, Adjustment of device  Trinna Post 10/09/2020, 4:45 AM

## 2020-10-09 NOTE — Progress Notes (Signed)
Patient off unit to OR for procedure.

## 2020-10-09 NOTE — Op Note (Addendum)
10/09/2020  6:24 PM  PATIENT:  Greg [name]  [birth year] male   MEDICAL RECORD NUMBER: [id number]  PRE-OPERATIVE DIAGNOSIS:  RIGHT ACETABULAR FRACTURE INVOLVING WEIGHT BEARING DOME/ BOTH COLUMNS WITH CENTRAL PROTRUSION  POST-OPERATIVE DIAGNOSIS:  RIGHT ACETABULAR FRACTURE INVOLVING WEIGHT BEARING DOME/ BOTH COLUMNS WITH CENTRAL PROTRUSION  PROCEDURE:   OPEN REDUCTION INTERNAL FIXATION OF RIGHT ACETABULAR FRACTURE INVOLVING THE WEIGHT BEARING DOME/ TWO COLUMNS USING ANTERIOR STOPPA APPROACH 2.   OPEN REDUCTION OF RIGHT HIP PROTRUSIO SUBLUXATION/ DISLOCATION  SURGEON:  Doralee Albino. Carola Frost, M.D.  ASSISTANT:  Montez Morita, PA-C.  ANESTHESIA:  General.  COMPLICATIONS:  None.  EBL:  200 mL   BLOOD ADMINISTERED:none  DRAINS: none   LOCAL MEDICATIONS USED:  NONE  SPECIMEN:  No Specimen  DISPOSITION OF SPECIMEN:  N/A  COUNTS:  YES  TOURNIQUET:  * No tourniquets in log *`  DICTATION: Note written in EPIC  PLAN OF CARE: Admit to inpatient   PATIENT DISPOSITION:  PACU - hemodynamically stable.   Delay start of Pharmacological VTE agent (>24hrs) due to surgical blood loss or risk of bleeding: no  BRIEF SUMMARY OF INDICATION OF PROCEDURE:  Greg [name] is a very pleasant 87 y.o.-year-old who sustained a right acetabular fracture. After medical evaluation and resuscitation we recommended surgical repair to best restore function. I did discuss with the patient option for nonoperative treatment as well as the risks and benefits of surgery including the potential for blood loss, infection, urologic injury, DVT, PE, heart attack, stroke, death, nerve and vessel injury, infection, and multiple others. We also specifically discussed urologic injury, sexual dysfunction, and possibility of hip arthritis which could necessitate conversion to total hip arthroplasty. These risks were acknowledged and consent provided to proceed.  BRIEF SUMMARY OF PROCEDURE:  The patient was taken to the operating  room after administration of antibiotics.  General anesthesia was induced. He was positioned supine with a bump under the operative side and ipsilateral hip flexed 40 degrees. A chlorhexidine scrub and then full Betadine scrub and paint were then applied to the abdomen and iliac wing areas.  Time-out was held.   A Pfannenstiel incision of 9 cm was made superior to the pelvic brim. Dissection was carried down to the pyramidalis, which was tagged.  A vertical limb was made through the linea alba and retention sutures placed in the distal aspect.  I carefully made my way around the left pelvic brim.  I did not identify any significant disruption of the pubic symphysis.  I did identify the so-called corona mortis vessels bridging the obturator and epigastric systems.  Using several vascular clips and suture ligation, I secured and divided these and continued along the brim with the help of my assistant who used  lighted long blunt Meyerding retractors.  We then were able to encounter the fracture site and associated hematoma, which was cleaned with curettes and lavage.  Using a ball spike pusher, I applied combined lateral and caudal force to the fracture I was unable to produce reduction. I then inserted a 5 mm Schanz pin into the right proximal femur and pulled traction to reduce the hip from its subluxated/ dislocated protrusio position into an appropriate one under the iliac wing. My assistant held this in position while I returned back to the [place] window. I applied the suprapectineal plate from [employer], secured fixation anteriorly, and then with reapplied lateral and caudal force to push the fracture into a reduced position using a ball spike pusher. I secured standard screw fixation  into the sciatic buttress of the posterior column and retroacetabular area above the joint line. This reduced the anterior column and the posterior column components. I carefully placed additional screws into the  anterior column above and below the fracture site and also secured screws into the most inferior portions of the plate adjacent to the ischium and in the sciatic buttress of the posterior column.  I checked AP and Judet films throughout and final films showed what appeared to be excellent reduction of the articular surface of the posterior column, of the anterior column, and then also of the iliac wing.  The pelvis was irrigated thoroughly and then closed in standard layered fashion using #1 Vicryl at the pelvic brim, #1 for the deep subcu, 0 Vicryl, 2-0 Vicryl, and 3-0 nylon along the pelvic brim.  We repaired the fascia directly over the iliac crest, and then 0 Vicryl, 2-0 Vicryl, and 3-0 nylon, and then a Mepilex dressing.  A malleable retractor was used to protect the bladder at all times while in the pelvis.  Montez Morita, PAC, assisted me throughout.  An assistant was absolutely necessary for the safe and effective completion of the case.  The patient was taken to the PACU in stable condition.  There were no complications during the procedure.  PROGNOSIS:  Greg [name] has sustained a complex injury and the repair has restored sufficient congruity and stability to the hip joint to enable bed-to-chair mobilization with touchdown weightbearing on the left lower extremity for the next 8 weeks and graduated weightbearing for the ensuing 4 weeks.  Formal pharmacologic DVT prophylaxis. There remains increased risk for perioperative complications given associated injuries, comorbidities, and age.  We will continue to follow closely as patient remains on the trauma service primarily.

## 2020-10-09 NOTE — ED Notes (Addendum)
This pt needed to urinate so the son helped him use the urinal and noted blood at the meatus.  I took a look.  The urine did not seem obviously bloody but their was enough blood to create an approx. quarter-size spot on his brief and still have a little at the meatus when I checked it.  It does not appear to be continuing to come out at this point.  Wanted to make sure you knew to ensure it is not a complication of the pelvic fx.  The UA has been sent. Admitting has been notified.

## 2020-10-09 NOTE — Consult Note (Signed)
Orthopaedic Trauma Service (OTS) Consult   Patient ID: Greg [name] MRN: [id number] DOB/AGE: 1931-10-01 87 y.o.   Reason for Consult: Right acetabulum fracture Referring Physician: Samson Frederic, MD (ortho)   HPI: Greg [name] is an 87 y.o. male with history of Parkinson's and osteoporosis who sustained a ground-level fall on 10/08/2020.  Patient has just moved into the friend's home here in [place] with his wife.  He was trying to get up when he subsequently fell.  Landed directly on his right hip.  Patient had immediate onset of pain and inability to bear weight.  He was brought to [facility] emergency department where he was found to have a comminuted right acetabulum fracture with central protrusion.  Patient admitted to the medical service.  Orthopedics was consulted for management.  Due to the complexity of the injury the orthopedic trauma service was consulted for definitive management.  Patient was placed into 10 pounds of Buck's traction as well for comfort.  Patient seen and evaluated on 6 N. room 31 by the orthopedic trauma service.  He is quite comfortable.  Pain is exacerbated with movement it is relieved with rest and pain medication.  He has been on bedrest since admission.  He is in 10 pounds of Buck's traction.  Denies any additional injuries elsewhere.  He does not use a walker or a cane regularly at baseline however he does use a cane on occasion.  It does sound as if he may have some issues with balance but per family report it is only related to changing direction quickly.  He is ambulatory at baseline.  Patient does have osteoporosis.  He is not on any pharmacologic treatment for osteoporosis.  Does take supplemental calcium and vitamin D.  Denies any numbness or tingling in his lower extremities No chest pain or shortness of breath No abdominal pain No lightheadedness or dizziness  Past Medical History:  Diagnosis Date   Acute bronchiolitis    Anemia     Bursitis of both hips    Hearing reduced    hearing aid   History of basal cell carcinoma    Hyperlipidemia    Malaise and fatigue    Osteoarthritis    hand   Osteoporosis    Palpitation    Vitamin D deficiency     Past Surgical History:  Procedure Laterality Date   CATARACT EXTRACTION  05/31/2015   Eye/right   COLONOSCOPY  02/05/2005, 03/06/2005   normal, 10 years ago   MOHS micrographic surgery     Face surgery    Family History  Problem Relation Age of Onset   Prostate cancer Father    Skin cancer Brother     Social History:  reports that he has never smoked. He has never used smokeless tobacco. He reports that he does not drink alcohol and does not use drugs.  Allergies: Not on File  Medications: I have reviewed the patient's current medications. Current Meds  Medication Sig   Calcium Carbonate-Vitamin D (CALCIUM-VITAMIN D3 PO) Take 1 tablet by mouth in the morning and at bedtime.   carbidopa-levodopa (SINEMET CR) 50-200 MG tablet Take 1 tablet by mouth at bedtime.   carbidopa-levodopa (SINEMET IR) 25-100 MG tablet Take 1 tablet by mouth 4 (four) times daily.   Multiple Vitamins-Minerals (MACULAR HEALTH FORMULA) CAPS Take 1 capsule by mouth daily.     Results for orders placed or performed during the hospital encounter of 10/08/20 (from the past 48  hour(s))  Basic metabolic panel     Status: Abnormal   Collection Time: 10/08/20  5:22 PM  Result Value Ref Range   Sodium 134 (L) 135 - 145 mmol/L   Potassium 4.5 3.5 - 5.1 mmol/L   Chloride 100 98 - 111 mmol/L   CO2 26 22 - 32 mmol/L   Glucose, Bld 139 (H) 70 - 99 mg/dL    Comment: Glucose reference range applies only to samples taken after fasting for at least 8 hours.   BUN 27 (H) 8 - 23 mg/dL   Creatinine, Ser 1.61 0.61 - 1.24 mg/dL   Calcium 8.8 (L) 8.9 - 10.3 mg/dL   GFR, Estimated >09 >60 mL/min    Comment: (NOTE) Calculated using the CKD-EPI Creatinine Equation (2021)    Anion gap 8 5 - 15     Comment: Performed at [facility], [address], [place], [place] [address]  CBC with Differential     Status: Abnormal   Collection Time: 10/08/20  5:22 PM  Result Value Ref Range   WBC 11.7 (H) 4.0 - 10.5 K/uL   RBC 4.04 (L) 4.22 - 5.81 MIL/uL   Hemoglobin 13.2 13.0 - 17.0 g/dL   HCT 81.1 91.4 - 78.2 %   MCV 97.8 80.0 - 100.0 fL   MCH 32.7 26.0 - 34.0 pg   MCHC 33.4 30.0 - 36.0 g/dL   RDW 95.6 21.3 - 08.6 %   Platelets 160 150 - 400 K/uL   nRBC 0.0 0.0 - 0.2 %   Neutrophils Relative % 87 %   Neutro Abs 10.2 (H) 1.7 - 7.7 K/uL   Lymphocytes Relative 5 %   Lymphs Abs 0.5 (L) 0.7 - 4.0 K/uL   Monocytes Relative 7 %   Monocytes Absolute 0.8 0.1 - 1.0 K/uL   Eosinophils Relative 0 %   Eosinophils Absolute 0.0 0.0 - 0.5 K/uL   Basophils Relative 0 %   Basophils Absolute 0.0 0.0 - 0.1 K/uL   Immature Granulocytes 1 %   Abs Immature Granulocytes 0.06 0.00 - 0.07 K/uL    Comment: Performed at [facility], [address] [place], [place] [address]  Hemoglobin A1c     Status: Abnormal   Collection Time: 10/08/20  6:19 PM  Result Value Ref Range   Hgb A1c MFr Bld 5.9 (H) 4.8 - 5.6 %    Comment: (NOTE) Pre diabetes:          5.7%-6.4%  Diabetes:              >6.4%  Glycemic control for   <7.0% adults with diabetes    Mean Plasma Glucose 122.63 mg/dL    Comment: Performed at [facility], [address] [place], [place] [address]  Resp Panel by RT-PCR (Flu A&B, Covid) Nasopharyngeal Swab     Status: None   Collection Time: 10/08/20  6:42 PM   Specimen: Nasopharyngeal Swab; Nasopharyngeal(NP) swabs in vial transport medium  Result Value Ref Range   SARS Coronavirus 2 by RT PCR NEGATIVE NEGATIVE    Comment: (NOTE) SARS-CoV-2 target nucleic acids are NOT DETECTED.  The SARS-CoV-2 RNA is generally detectable in upper respiratory specimens during the acute phase of infection. The lowest concentration of SARS-CoV-2 viral copies this assay can detect is 138  copies/mL. A negative result does not preclude SARS-Cov-2 infection and should not be used as the sole basis for treatment or other patient management decisions. A negative result may occur  with  improper specimen collection/handling, submission of specimen other than nasopharyngeal swab, presence of viral mutation(s) within the areas targeted by this assay, and inadequate number of viral copies(<138 copies/mL). A negative result must be combined with clinical observations, patient history, and epidemiological information. The expected result is Negative.  Fact Sheet for Patients:  BloggerCourse.com  Fact Sheet for Healthcare Providers:  SeriousBroker.it  This test is no t yet approved or cleared by the [place] FDA and  has been authorized for detection and/or diagnosis of SARS-CoV-2 by FDA under an Emergency Use Authorization (EUA). This EUA will remain  in effect (meaning this test can be used) for the duration of the COVID-19 declaration under Section 564(b)(1) of the Act, 21 U.S.C.section 360bbb-3(b)(1), unless the authorization is terminated  or revoked sooner.       Influenza A by PCR NEGATIVE NEGATIVE   Influenza B by PCR NEGATIVE NEGATIVE    Comment: (NOTE) The Xpert Xpress SARS-CoV-2/FLU/RSV plus assay is intended as an aid in the diagnosis of influenza from Nasopharyngeal swab specimens and should not be used as a sole basis for treatment. Nasal washings and aspirates are unacceptable for Xpert Xpress SARS-CoV-2/FLU/RSV testing.  Fact Sheet for Patients: BloggerCourse.com  Fact Sheet for Healthcare Providers: SeriousBroker.it  This test is not yet approved or cleared by the [place] FDA and has been authorized for detection and/or diagnosis of SARS-CoV-2 by FDA under an Emergency Use Authorization (EUA). This EUA will remain in effect (meaning this test  can be used) for the duration of the COVID-19 declaration under Section 564(b)(1) of the Act, 21 U.S.C. section 360bbb-3(b)(1), unless the authorization is terminated or revoked.  Performed at [facility], [address], [place], [place] [address]   Urinalysis, Routine w reflex microscopic Urine, Clean Catch     Status: Abnormal   Collection Time: 10/08/20 11:27 PM  Result Value Ref Range   Color, Urine YELLOW YELLOW   APPearance HAZY (A) CLEAR   Specific Gravity, Urine 1.025 1.005 - 1.030   pH 5.0 5.0 - 8.0   Glucose, UA NEGATIVE NEGATIVE mg/dL   Hgb urine dipstick LARGE (A) NEGATIVE   Bilirubin Urine NEGATIVE NEGATIVE   Ketones, ur 5 (A) NEGATIVE mg/dL   Protein, ur NEGATIVE NEGATIVE mg/dL   Nitrite NEGATIVE NEGATIVE   Leukocytes,Ua NEGATIVE NEGATIVE   RBC / HPF >50 (H) 0 - 5 RBC/hpf   WBC, UA 0-5 0 - 5 WBC/hpf   Bacteria, UA NONE SEEN NONE SEEN   Mucus PRESENT    Hyaline Casts, UA PRESENT     Comment: Performed at [facility], [address], [place], [place] [address]  Hemoglobin and hematocrit, blood     Status: Abnormal   Collection Time: 10/09/20  2:11 AM  Result Value Ref Range   Hemoglobin 12.2 (L) 13.0 - 17.0 g/dL   HCT 08.6 (L) 76.1 - 95.0 %    Comment: Performed at [facility], [address], [place], [place] [address]  Surgical pcr screen     Status: None   Collection Time: 10/09/20  4:06 AM   Specimen: Nasal Mucosa; Nasal Swab  Result Value Ref Range   MRSA, PCR NEGATIVE NEGATIVE   Staphylococcus aureus NEGATIVE NEGATIVE    Comment: (NOTE) The Xpert SA Assay (FDA approved for NASAL specimens in patients 10 years of age and older), is one component of a comprehensive surveillance program. It is not intended to diagnose infection nor to guide or  monitor treatment. Performed at [facility], [address] [place], [place] [address]   CBC     Status: Abnormal   Collection Time: 10/09/20  5:46 AM  Result Value Ref Range   WBC  11.6 (H) 4.0 - 10.5 K/uL   RBC 3.64 (L) 4.22 - 5.81 MIL/uL   Hemoglobin 11.8 (L) 13.0 - 17.0 g/dL   HCT 16.9 (L) 67.8 - 93.8 %   MCV 95.6 80.0 - 100.0 fL   MCH 32.4 26.0 - 34.0 pg   MCHC 33.9 30.0 - 36.0 g/dL   RDW 10.1 75.1 - 02.5 %   Platelets 130 (L) 150 - 400 K/uL    Comment: REPEATED TO VERIFY   nRBC 0.0 0.0 - 0.2 %    Comment: Performed at [facility], [address] [place], [place] [address]  Phosphorus     Status: None   Collection Time: 10/09/20  5:46 AM  Result Value Ref Range   Phosphorus 3.4 2.5 - 4.6 mg/dL    Comment: Performed at [facility], [address], [place], [place] [address]    CT HEAD WO CONTRAST  Result Date: 10/08/2020 CLINICAL DATA:  Head trauma from a fall. EXAM: CT HEAD WITHOUT CONTRAST TECHNIQUE: Contiguous axial images were obtained from the base of the skull through the vertex without intravenous contrast. COMPARISON:  None. FINDINGS: Brain: No evidence of acute infarction, hemorrhage, hydrocephalus, extra-axial collection or mass lesion/mass effect. Mild cerebral atrophy. Patchy low-attenuation changes in the deep white matter consistent with small vessel ischemic change. Vascular: No hyperdense vessel or unexpected calcification. Skull: Calvarium appears intact. Sinuses/Orbits: Paranasal sinuses and mastoid air cells are clear. Other: None. IMPRESSION: No acute intracranial abnormalities. Chronic atrophy and small vessel ischemic changes. Electronically Signed   By: Burman Nieves M.D.   On: 10/08/2020 20:30   CT PELVIS WO CONTRAST  Result Date: 10/08/2020 CLINICAL DATA:  Status post fall.  Right acetabular fracture. EXAM: CT PELVIS WITHOUT CONTRAST TECHNIQUE: Multidetector CT imaging of the pelvis was performed following the standard protocol without intravenous contrast. COMPARISON:  Hip films from earlier the same day FINDINGS: Urinary Tract:  Bladder is moderately distended. Bowel:  Unremarkable. Vascular/Lymphatic: Atherosclerotic  calcification noted lower abdominal aorta and iliac arteries. Reproductive:  Prostate gland is enlarged. Other:  None. Musculoskeletal: Severely comminuted right acetabular fracture identified with fracture fragments distracted by at least 15 mm. Fracture involves the roof of the acetabulum, medial acetabulum and posterior lip of the acetabulum. Fracture extends cranially up into the iliac wing and anteriorly to involve the right superior pubic ramus. Associated comminuted fracture noted inferior right pubic ramus. Soft tissue windows demonstrate extensive hemorrhage in the extraperitoneal pelvic sidewall extending into the presacral space and right groin region. IMPRESSION: Severely comminuted right acetabular fracture involving the roof of the acetabulum, medial acetabulum and posterior lip. Fracture extends anteriorly into the superior pubic ramus. Associated comminuted fracture noted right inferior pubic ramus. Extensive hemorrhage in the soft tissues adjacent to the right hip, involving the extraperitoneal right pelvic sidewall. Electronically Signed   By: Kennith Center M.D.   On: 10/08/2020 18:30   DG Hip Unilat With Pelvis 2-3 Views Right  Result Date: 10/08/2020 CLINICAL DATA:  RIGHT hip pain status post fall EXAM: DG HIP (WITH OR WITHOUT PELVIS) 2-3V RIGHT COMPARISON:  None. FINDINGS: There is protrusion of the RIGHT femoral head through the medial border of the acetabulum. There is discontinuity of the iliopectineal line on the RIGHT.  No RIGHT femoral neck fracture. IMPRESSION: Fracture through the medial wall of the RIGHT acetabulum with protrusion of the femoral head. Electronically Signed   By: Genevive BiStewart  Edmunds M.D.   On: 10/08/2020 17:17    Intake/Output    None      Review of Systems  Constitutional:  Negative for chills and fever.  Respiratory:  Negative for shortness of breath and wheezing.   Cardiovascular:  Negative for chest pain and palpitations.  Gastrointestinal:  Negative for  abdominal pain, nausea and vomiting.  Musculoskeletal:        R hip pain   Neurological:  Negative for tingling and sensory change.  Blood pressure 122/71, pulse 89, temperature 98.4 F (36.9 C), temperature source Oral, resp. rate 18, SpO2 97 %. Physical Exam Vitals and nursing note reviewed.  Constitutional:      General: He is awake.     Comments: Very pleasant, NAD, appears comfortable   Eyes:     Extraocular Movements: Extraocular movements intact.  Cardiovascular:     Heart sounds: S1 normal and S2 normal.  Pulmonary:     Effort: Pulmonary effort is normal. No respiratory distress.  Abdominal:     Comments: Soft, NTND, + BS   Genitourinary:    Comments: External catheter in place  Musculoskeletal:     Cervical back: No spinous process tenderness or muscular tenderness.     Comments: Pelvis and R LEx   TTP R hemipelvis    + pain with gentle log roll of R hip     No traumatic wounds, no open wounds    Thigh, knee, lower leg, ankle and foot w/o acute findings    10 lbs of bucks traction in place    No knee effusion noted      DPN, SPN, TN sensation intact     EHL, FHL, lesser toe motor intact    Ankle flexion, extension inversion and eversion intact    Obturator sensation grossly intact     Ext warm      + DP pulse      No DCT      No pitting edema   Left Lower Extremity              no open wounds or lesions, no swelling or ecchymosis   Nontender hip, knee, ankle and foot             No crepitus or gross motion noted with manipulation of the left leg  No knee or ankle effusion             No pain with axial loading or logrolling of the hip. Negative Stinchfield test   Knee stable to varus/ valgus and anterior/posterior stress             No pain with manipulation of the ankle or foot             No blocks to motion noted  Sens DPN, SPN, TN intact  Motor EHL, FHL, lesser toe motor, Ext, flex, evers 5/5  DP 2+, No significant edema             Compartments are  soft and nontender, no pain with passive stretching   B upper extremities     shoulder, elbow, wrist, digits- no skin wounds, nontender, no instability, no blocks to motion  Sens  Ax/R/M/U intact  Mot   Ax/ R/ PIN/ M/ AIN/ U intact  Rad 2+      Skin:  General: Skin is warm.     Capillary Refill: Capillary refill takes less than 2 seconds.  Neurological:     Mental Status: He is alert and oriented to person, place, and time.     Comments: Did not assess gait   Psychiatric:        Attention and Perception: Attention and perception normal.        Mood and Affect: Mood and affect normal.        Speech: Speech normal.        Behavior: Behavior is cooperative.     Assessment/Plan:  87 year old male comminuted right acetabulum fracture following ground-level fall  -Ground-level fall  -Fragility/insufficiency fracture right acetabulum (both column + PW)  Discussed treatment options: operative vs non-op  Pt wishes to proceed with surgical intervention   Plan for OR today    TDWB R leg x 8 weeks post op   Posterior hip precautions post op      PT/OT evals post op     Pt does have elevated risk for complications such as nonunion and hardware failure due to osteoporosis.  Do think that surgery will give him the best opportunity to get back to baseline function    Pre-op labs including INR, Type and screen   - Pain management:  Multimodal    Will schedule tylenol post op    Prn Norco   - ABL anemia/Hemodynamics  Stable   - Medical issues   Per primary   - DVT/PE prophylaxis:  Lovenox post op   Will transition to xarelto prior to dc for 4-6 weeks - ID:   Periop abxx  - Metabolic Bone Disease:  Known osteoporosis   Check vitamin d levels   Fracture/mechanism suggestive of fragility fracture  - Activity:  Bed rest for now  Therapies post op  - FEN/GI prophylaxis/Foley/Lines:  NPO  Advance diet post op   - Impediments to fracture healing:  Osteoporosis  -  Dispo:  OR today for ORIF R acetabulum     Mearl Latin, PA-C [phone] (C) 10/09/2020, 10:21 AM  [facility] [address] [place] [address] [phone] Val [phone] (F)    After 5pm and on the weekends please log on to Amion, go to orthopaedics and the look under the [facility] Call for the provider(s) on call. You can also call our office at [phone] and then follow the prompts to be connected to the call team.

## 2020-10-09 NOTE — Progress Notes (Signed)
Initial Nutrition Assessment  DOCUMENTATION CODES:  Not applicable  INTERVENTION:  Advance diet as medically able - recommend regular diet once advanced.  Add Ensure Enlive po daily, each supplement provides 350 kcal and 20 grams of protein.  Add Magic cup TID with meals, each supplement provides 290 kcal and 9 grams of protein.  Add MVI with minerals daily.  Obtain updated weight.  NUTRITION DIAGNOSIS:  Increased nutrient needs related to post-op healing as evidenced by estimated needs.  GOAL:  Patient will meet greater than or equal to 90% of their needs  MONITOR:  Diet advancement, PO intake, Supplement acceptance, Labs, Weight trends, Skin, I & O's  REASON FOR ASSESSMENT:  Consult Hip fracture protocol  ASSESSMENT:  87 yo male with a PMH of Parkinson's disease poorly controlled, Vitamin D deficiency, HLD, anemia, osteoporosis, osteoarthritis, and hx of basal cell carcinoma who presents with mechanical fall and hip fracture.  Pt in procedure at time of RD visit.  Spoke with friend at bedside. Friend reports that he eats very well at the home they live in together. They have three scheduled meals, which he eats to completion.  Friend denies any changes in pt's weight recently. Pt with no weight this admission, but weight has been increasing for the past two months per Epic.  Add Magic Cup TID and Ensure daily to aid in healing for surgery. Recommend regular diet once diet is advanced.  Medications: reviewed; Oscal BID, Sinemet, colace BID, LR @ 10 ml/hr  Labs: reviewed; Na 132 (L), Glucose 124 (H)  NUTRITION - FOCUSED PHYSICAL EXAM: Unable to perform  Diet Order:   Diet Order             Diet NPO time specified Except for: Sips with Meds  Diet effective midnight                  EDUCATION NEEDS: Education needs have been addressed  Skin:  Skin Assessment: Skin Integrity Issues: Skin Integrity Issues:: Incisions Incisions: R abdomen, closed  Last BM:   10/07/20 - per pt report  Height:  Ht Readings from Last 1 Encounters:  09/26/20 5\' 9"  (1.753 m)   Weight:  Wt Readings from Last 1 Encounters:  09/26/20 86.4 kg   Ideal Body Weight:  72.7 kg  BMI:  There is no height or weight on file to calculate BMI.  Estimated Nutritional Needs:  Kcal:  1750-1950 Protein:  100-115 grams Fluid:  >1.75 L  09/28/20, RD, LDN (she/her/hers) Registered Dietitian I After-Hours/Weekend Pager # in [phone]

## 2020-10-09 NOTE — ED Notes (Signed)
Admitting concurs that it is possible the blood is related to the pelvic trauma.  I have noted that the Hgb dipstick was large on UA.

## 2020-10-09 NOTE — Anesthesia Procedure Notes (Addendum)
Procedure Name: Intubation Date/Time: 10/09/2020 11:44 AM Performed by: Lovie Chol, CRNA Pre-anesthesia Checklist: Patient identified, Emergency Drugs available, Suction available, Patient being monitored and Timeout performed Patient Re-evaluated:Patient Re-evaluated prior to induction Oxygen Delivery Method: Circle system utilized Preoxygenation: Pre-oxygenation with 100% oxygen Induction Type: IV induction Ventilation: Mask ventilation without difficulty Laryngoscope Size: Miller and 3 Grade View: Grade I Tube type: Oral Tube size: 7.5 mm Number of attempts: 1 Airway Equipment and Method: Stylet Placement Confirmation: ETT inserted through vocal cords under direct vision, positive ETCO2 and breath sounds checked- equal and bilateral Secured at: 23 cm Tube secured with: Tape Dental Injury: Teeth and Oropharynx as per pre-operative assessment

## 2020-10-09 NOTE — Anesthesia Postprocedure Evaluation (Signed)
Anesthesia Post Note  Patient: Greg [name]  Procedure(s) Performed: OPEN REDUCTION INTERNAL FIXATION (ORIF) ACETABULAR FRACTURE (Right: Pelvis)     Patient location during evaluation: PACU Anesthesia Type: General Level of consciousness: awake and alert Pain management: pain level controlled Vital Signs Assessment: post-procedure vital signs reviewed and stable Respiratory status: spontaneous breathing, nonlabored ventilation, respiratory function stable and patient connected to nasal cannula oxygen Cardiovascular status: blood pressure returned to baseline and stable Postop Assessment: no apparent nausea or vomiting Anesthetic complications: no   No notable events documented.  Last Vitals:  Vitals:   10/09/20 1724 10/09/20 1800  BP: 140/80 (!) 142/64  Pulse: 98 94  Resp:    Temp: 36.9 C 37.1 C  SpO2: 93% 97%    Last Pain:  Vitals:   10/09/20 1800  TempSrc: Axillary  PainSc: 0-No pain                 Valinda Fedie

## 2020-10-09 NOTE — Progress Notes (Signed)
Foley removed upon arrival to the unit post-op. NO order for foley in the chart.

## 2020-10-09 NOTE — Plan of Care (Signed)
  Problem: Activity: Goal: Ability to ambulate and perform ADLs will improve Outcome: Progressing   

## 2020-10-09 NOTE — Progress Notes (Signed)
Patient admitted from ED to 6N31 s/p mechanical fall with right hip fracture. Alert and oriented x4. Vital signs within normal limits. Denies c/o pain at this time. Oriented to room and remote. Ortho tech notified for bucks traction order.

## 2020-10-09 NOTE — Transfer of Care (Signed)
Immediate Anesthesia Transfer of Care Note  Patient: Greg [name]  Procedure(s) Performed: OPEN REDUCTION INTERNAL FIXATION (ORIF) ACETABULAR FRACTURE (Right: Pelvis)  Patient Location: PACU  Anesthesia Type:General  Level of Consciousness: oriented, drowsy and patient cooperative  Airway & Oxygen Therapy: Patient Spontanous Breathing and Patient connected to face mask oxygen  Post-op Assessment: Report given to RN and Post -op Vital signs reviewed and stable  Post vital signs: Reviewed  Last Vitals:  Vitals Value Taken Time  BP 180/92 10/09/20 1439  Temp    Pulse 103 10/09/20 1445  Resp 26 10/09/20 1445  SpO2 95 % 10/09/20 1445  Vitals shown include unvalidated device data.  Last Pain:  Vitals:   10/09/20 1031  TempSrc: Oral  PainSc: 3          Complications: No notable events documented.

## 2020-10-10 ENCOUNTER — Encounter (HOSPITAL_COMMUNITY): Payer: Self-pay | Admitting: Orthopedic Surgery

## 2020-10-10 LAB — CBC
HCT: 25.3 % — ABNORMAL LOW (ref 39.0–52.0)
Hemoglobin: 8.7 g/dL — ABNORMAL LOW (ref 13.0–17.0)
MCH: 33.1 pg (ref 26.0–34.0)
MCHC: 34.4 g/dL (ref 30.0–36.0)
MCV: 96.2 fL (ref 80.0–100.0)
Platelets: 93 10*3/uL — ABNORMAL LOW (ref 150–400)
RBC: 2.63 MIL/uL — ABNORMAL LOW (ref 4.22–5.81)
RDW: 13.8 % (ref 11.5–15.5)
WBC: 13.8 10*3/uL — ABNORMAL HIGH (ref 4.0–10.5)
nRBC: 0 % (ref 0.0–0.2)

## 2020-10-10 LAB — BASIC METABOLIC PANEL
Anion gap: 6 (ref 5–15)
BUN: 23 mg/dL (ref 8–23)
CO2: 28 mmol/L (ref 22–32)
Calcium: 8.7 mg/dL — ABNORMAL LOW (ref 8.9–10.3)
Chloride: 97 mmol/L — ABNORMAL LOW (ref 98–111)
Creatinine, Ser: 1.2 mg/dL (ref 0.61–1.24)
GFR, Estimated: 59 mL/min — ABNORMAL LOW (ref >60)
Glucose, Bld: 134 mg/dL — ABNORMAL HIGH (ref 70–99)
Potassium: 4.2 mmol/L (ref 3.5–5.1)
Sodium: 131 mmol/L — ABNORMAL LOW (ref 135–145)

## 2020-10-10 LAB — HEMOGLOBIN AND HEMATOCRIT, BLOOD
HCT: 26.6 % — ABNORMAL LOW (ref 39.0–52.0)
Hemoglobin: 9 g/dL — ABNORMAL LOW (ref 13.0–17.0)

## 2020-10-10 LAB — MAGNESIUM: Magnesium: 2.1 mg/dL (ref 1.7–2.4)

## 2020-10-10 MED ORDER — VITAMIN D 25 MCG (1000 UNIT) PO TABS
2000.0000 [IU] | ORAL_TABLET | Freq: Two times a day (BID) | ORAL | Status: DC
  Administered 2020-10-10 – 2020-10-12 (×5): 2000 [IU] via ORAL
  Filled 2020-10-10 (×5): qty 2

## 2020-10-10 NOTE — Progress Notes (Signed)
Orthopaedic Trauma Service Progress Note  Patient ID: Greg [name] MRN: [id number] DOB/AGE: [birth year] 87 y.o.  Subjective:  Looks really good this am Feels good Minimal pain  Sat on EOB already   No other concerns or complaints    ROS As above Objective:   VITALS:   Vitals:   10/09/20 2020 10/10/20 0248 10/10/20 0520 10/10/20 0806  BP: 137/66 (!) 119/55 118/64 114/74  Pulse: 93 91 88 89  Resp: 15 17 18 18   Temp: 97.7 F (36.5 C) 98 F (36.7 C) 97.6 F (36.4 C)   TempSrc: Oral Oral Oral   SpO2: 97% 96% 94% 93%    Estimated body mass index is 28.12 kg/m as calculated from the following:   Height as of 09/26/20: 5\' 9"  (1.753 m).   Weight as of 09/26/20: 86.4 kg.   Intake/Output      07/05 0701 07/06 0700 07/06 0701 07/07 0700   P.O. 140    I.V. 1800    Other 0    IV Piggyback 450    Total Intake 2390    Urine 1175    Blood 500    Total Output 1675    Net +715         Urine Occurrence 1 x      LABS  Results for orders placed or performed during the hospital encounter of 10/08/20 (from the past 24 hour(s))  Hemoglobin and hematocrit, blood     Status: Abnormal   Collection Time: 10/09/20  6:45 PM  Result Value Ref Range   Hemoglobin 10.3 (L) 13.0 - 17.0 g/dL   HCT 12/09/20 (L) 12/10/20 - 40.9 %  Hemoglobin and hematocrit, blood     Status: Abnormal   Collection Time: 10/10/20 12:21 AM  Result Value Ref Range   Hemoglobin 9.0 (L) 13.0 - 17.0 g/dL   HCT 91.4 (L) 12/11/20 - 78.2 %  Basic metabolic panel     Status: Abnormal   Collection Time: 10/10/20  6:07 AM  Result Value Ref Range   Sodium 131 (L) 135 - 145 mmol/L   Potassium 4.2 3.5 - 5.1 mmol/L   Chloride 97 (L) 98 - 111 mmol/L   CO2 28 22 - 32 mmol/L   Glucose, Bld 134 (H) 70 - 99 mg/dL   BUN 23 8 - 23 mg/dL   Creatinine, Ser 21.3 0.61 - 1.24 mg/dL   Calcium 8.7 (L) 8.9 - 10.3 mg/dL   GFR, Estimated 59 (L) >60 mL/min   Anion  gap 6 5 - 15  Magnesium     Status: None   Collection Time: 10/10/20  6:07 AM  Result Value Ref Range   Magnesium 2.1 1.7 - 2.4 mg/dL  CBC     Status: Abnormal   Collection Time: 10/10/20  6:07 AM  Result Value Ref Range   WBC 13.8 (H) 4.0 - 10.5 K/uL   RBC 2.63 (L) 4.22 - 5.81 MIL/uL   Hemoglobin 8.7 (L) 13.0 - 17.0 g/dL   HCT 12/11/20 (L) 12/11/20 - 57.8 %   MCV 96.2 80.0 - 100.0 fL   MCH 33.1 26.0 - 34.0 pg   MCHC 34.4 30.0 - 36.0 g/dL   RDW 46.9 62.9 - 52.8 %   Platelets 93 (L) 150 - 400 K/uL   nRBC 0.0 0.0 - 0.2 %  PHYSICAL EXAM:   Gen: awake and alert, NAD, pleasant, wife at bedside  Lungs: unlabored Abd: NTND Pelvis: dressing pfannenstiel dressing c/d/I   Flank dressing clean   Motor and sensory functions intact B   Obturator sensation intact B  + DP pulses  No other acute findings noted   Assessment/Plan: 1 Day Post-Op   Active Problems:   Closed right hip fracture (HCC)   Closed displaced fracture of right acetabulum (HCC)   Anti-infectives (From admission, onward)    Start     Dose/Rate Route Frequency Ordered Stop   10/09/20 1800  ceFAZolin (ANCEF) IVPB 2g/100 mL premix        2 g 200 mL/hr over 30 Minutes Intravenous Every 6 hours 10/09/20 1605 10/10/20 0618   10/09/20 1100  ceFAZolin (ANCEF) IVPB 2g/100 mL premix        2 g 200 mL/hr over 30 Minutes Intravenous On call to O.R. 10/09/20 1046 10/09/20 1156   10/09/20 1056  ceFAZolin (ANCEF) 2-4 GM/100ML-% IVPB       Note to Pharmacy: Clovis Cao   : cabinet override      10/09/20 1056 10/09/20 1925     .  POD/HD#: 14  87 year old male comminuted right acetabulum fracture following ground-level fall   -Ground-level fall   -Fragility/insufficiency fracture right acetabulum (both column + PW) s/p ORIF                TDWB R leg x 8 weeks    Posterior hip precautions x 12 weeks    PT/OT    Ice PRN      Dressing changes as needed starting tomorrow      TOC consult for SNF    - Pain  management:               Multimodal                             Will schedule tylenol post op                             Prn Norco   - ABL anemia/Hemodynamics               monitor      Cbc in am    - Medical issues                Per primary    - DVT/PE prophylaxis:               Lovenox for now     Xarelto once h/h stabilized  - ID:                Periop abx   - Metabolic Bone Disease:               vitamin d levels look ok        - Activity:               therapy evals    - FEN/GI prophylaxis/Foley/Lines:               reg diet   - Impediments to fracture healing:               Osteoporosis  - Dispo:  Therapy evals  TOC consult   Mearl Latin, PA-C [phone] (C) 10/10/2020, 9:44 AM  [facility] [address] [place] [address] [phone] Collier Bullock (F)    After 5pm and on the weekends please log on to Amion, go to orthopaedics and the look under the [facility] Call for the provider(s) on call. You can also call our office at [phone] and then follow the prompts to be connected to the call team.

## 2020-10-10 NOTE — Hospital Course (Signed)
87 year old white male resident friend's home Parkinson's disease Diagnosed 2019 followed previously [facility]-currently follows Dr. Lurena Joiner [name] neurology (since 08/07/2020) Urinary frequency, osteoarthritis found, osteopenia, hyperlipidemia  Followed at facility after helping wife-fell backward hit head, hit right thigh-unable to get up because of right hip pain-Dr. Swinteck consulted for right acetabular fracture-recommended Buck's traction-  7/5 for ORIF with Ortho

## 2020-10-10 NOTE — Evaluation (Signed)
Physical Therapy Evaluation Patient Details Name: Greg [name] MRN: [id number] DOB: 01/24/1932 Today's Date: 10/10/2020   History of Present Illness  Greg [name] is a 87 y.o. male who presented to [facility] after mechanical fall and hip fracture now s/p ORIF R acetabulum (TWB & posterior hip precautions). Pt with medical history significant of Parkinson's disease poorly controlled, osteoporosis  Clinical Impression  Pt was seen for mobility with discussion of hip precautions first.  Pt did not recall any of them, but wife in attendance and knew where his list was from earlier.  Pt is able to help move but quite slow, and will need a lot of assistance due to TDWB and his Parkinson's mm tone.  BLE's are quite stiff, not initiating with RLE well.  Follow along with him to cue the sequence of movement and remind of precautions to avoid stressing R hip fracture.  Pt is recommended to SNF due to his prior ability to walk, and the need to increase automatic balance responses with mobility.  Encourage strength and ROM to RLE within Posterior precautions.    Follow Up Recommendations SNF    Equipment Recommendations  None recommended by PT    Recommendations for Other Services       Precautions / Restrictions Precautions Precautions: Posterior Hip;Fall Precaution Booklet Issued: Yes (comment) Precaution Comments: posterior hip Restrictions Weight Bearing Restrictions: Yes RLE Weight Bearing: Touchdown weight bearing      Mobility  Bed Mobility Overal bed mobility: Needs Assistance Bed Mobility: Rolling;Sidelying to Sit;Sit to Sidelying Rolling: Min assist;Mod assist Sidelying to sit: Max assist     Sit to sidelying: Max assist General bed mobility comments: pt  needed help with both legs, tired and having minor discomfort    Transfers Overall transfer level: Needs assistance   Transfers: Lateral/Scoot Transfers          Lateral/Scoot Transfers: Max assist;From elevated  surface General transfer comment: cued hand placement and used bed pad to assist, pt is helpful with the effort esp with UE's.  Ambulation/Gait             General Gait Details: unable to stand  Stairs            Wheelchair Mobility    Modified Rankin (Stroke Patients Only)       Balance Overall balance assessment: Needs assistance;History of Falls Sitting-balance support: Feet supported;Bilateral upper extremity supported Sitting balance-Leahy Scale: Poor                                       Pertinent Vitals/Pain Pain Assessment: 0-10 Pain Score: 5  Pain Location: R hip after moving Pain Descriptors / Indicators: Operative site guarding Pain Intervention(s): Limited activity within patient's tolerance;Monitored during session;Premedicated before session;Repositioned;Ice applied    Home Living Family/patient expects to be discharged to:: Assisted living Living Arrangements: Spouse/significant other             Home Equipment: Shower seat - built in Additional Comments: ALF is attached to SNF and will have equipment and PT/OT    Prior Function Level of Independence: Needs assistance   Gait / Transfers Assistance Needed: walking without AD but had been falling  ADL's / Homemaking Assistance Needed: Pt's wife assisted minimally with bathing & dressing  Comments: had been getting PT recently for Parkinson's     Hand Dominance   Dominant Hand: Right    Extremity/Trunk Assessment  Upper Extremity Assessment Upper Extremity Assessment: Defer to OT evaluation    Lower Extremity Assessment Lower Extremity Assessment: Generalized weakness;RLE deficits/detail RLE Deficits / Details: R hip strength 2-, knee and ankle 2    Cervical / Trunk Assessment Cervical / Trunk Assessment: Kyphotic  Communication   Communication: No difficulties  Cognition Arousal/Alertness: Awake/alert Behavior During Therapy: WFL for tasks  assessed/performed;Flat affect Overall Cognitive Status: Within Functional Limits for tasks assessed                                 General Comments: answering questions well      General Comments General comments (skin integrity, edema, etc.): pt is up to side of bed with help and worked on controlling balance with cues and assist for midline postural awareness and specific cues for sequencing    Exercises Other Exercises Other Exercises: strength on RLE is 2- hip and 2 knee/ankle   Assessment/Plan    PT Assessment Patient needs continued PT services  PT Problem List Decreased strength;Decreased range of motion;Decreased activity tolerance;Decreased balance;Decreased mobility;Decreased coordination;Decreased knowledge of use of DME;Decreased skin integrity;Pain       PT Treatment Interventions DME instruction;Gait training;Functional mobility training;Therapeutic exercise;Therapeutic activities;Balance training;Neuromuscular re-education;Patient/family education    PT Goals (Current goals can be found in the Care Plan section)  Acute Rehab PT Goals Patient Stated Goal: walk again PT Goal Formulation: With patient/family Time For Goal Achievement: 10/24/20 Potential to Achieve Goals: Fair    Frequency Min 5X/week   Barriers to discharge Decreased caregiver support home with just wife in ALF    Co-evaluation               AM-PAC PT "6 Clicks" Mobility  Outcome Measure Help needed turning from your back to your side while in a flat bed without using bedrails?: A Lot Help needed moving from lying on your back to sitting on the side of a flat bed without using bedrails?: A Lot Help needed moving to and from a bed to a chair (including a wheelchair)?: A Lot Help needed standing up from a chair using your arms (e.g., wheelchair or bedside chair)?: Total Help needed to walk in hospital room?: Total Help needed climbing 3-5 steps with a railing? : Total 6  Click Score: 9    End of Session Equipment Utilized During Treatment: Gait belt Activity Tolerance: Patient limited by fatigue;Patient limited by pain Patient left: in bed;with call bell/phone within reach;with bed alarm set;with family/visitor present Nurse Communication: Mobility status PT Visit Diagnosis: Unsteadiness on feet (R26.81);Muscle weakness (generalized) (M62.81);History of falling (Z91.81);Difficulty in walking, not elsewhere classified (R26.2);Pain Pain - Right/Left: Right Pain - part of body: Hip    Time: 3343-5686 PT Time Calculation (min) (ACUTE ONLY): 27 min   Charges:   PT Evaluation $PT Eval Moderate Complexity: 1 Mod PT Treatments $Therapeutic Activity: 8-22 mins       Ivar Drape 10/10/2020, 4:31 PM Samul Dada, PT MS Acute Rehab Dept. Number: [facility] [id number] and [facility] [phone]

## 2020-10-10 NOTE — Progress Notes (Signed)
PROGRESS NOTE   Greg [name]  ATF:[id number] DOB: 09-21-1931 DOA: 10/08/2020 PCP: [name], MD  Brief Narrative:  87 year old white male resident friend's home Parkinson's disease Diagnosed 2019 followed previously [facility]-currently follows Dr. Lurena Joiner [name] neurology (since 08/07/2020) Urinary frequency, osteoarthritis found, osteopenia, hyperlipidemia  Followed at facility after helping wife-fell backward hit head, hit right thigh-unable to get up because of right hip pain-Dr. Swinteck consulted for right acetabular fracture-recommended Buck's traction-  7/5 for ORIF with Ortho   Hospital-Problem based course  Right acetabular fracture mechanical fall status post ORIF 7/5 Defer planning per orthopedics-pain seems controlled HTN-blood pressure elevated secondary to possibly postop pain Continue Coreg 3.125 Adjust as needed-Mildly hypotensive earlier today we will discontinue if remains Prediabetes-A1c 5.9 Avoid stringent control Parkinson's disease Continue Sinemet IR 4 times daily, Sinemet CR at bedtime Leukocytosis NOS, mild thrombocytopenia Monitor trends, continue I-S-watch fever curve and mentation No indication at this time for antibiotics Anemia of expected blood loss as is postop Labs in a.m.  DVT prophylaxis: Lovenox Code Status: Full Family Communication: Discussed with wife at bedside Disposition:  Status is: Inpatient  Remains inpatient appropriate because:Persistent severe electrolyte disturbances, Unsafe d/c plan, and Inpatient level of care appropriate due to severity of illness  Dispo: The patient is from: SNF              Anticipated d/c is to: SNF              Patient currently is not medically stable to d/c.   Difficult to place patient No       Consultants:  Orthopedics  Procedures: ORIF 7/5  Antimicrobials: Perioperative   Subjective:  Took a long nap today-doing well-pain seems moderately controlled eating  and drinking-no chest pain no fever no chills-no rales no rhonchi  Objective: Vitals:   10/10/20 0248 10/10/20 0520 10/10/20 0806 10/10/20 0957  BP: (!) 119/55 118/64 114/74 (!) 91/41  Pulse: 91 88 89 82  Resp: 17 18 18 17   Temp: 98 F (36.7 C) 97.6 F (36.4 C)  (!) 97.5 F (36.4 C)  TempSrc: Oral Oral  Oral  SpO2: 96% 94% 93% 94%    Intake/Output Summary (Last 24 hours) at 10/10/2020 1415 Last data filed at 10/10/2020 1001 Gross per 24 hour  Intake 960 ml  Output 825 ml  Net 135 ml   There were no vitals filed for this visit.  Examination:  Bradykinetic no distress Masked facies S1-S2 possible murmur abdomen soft no rebound no guarding ROM intact but limited to ORIF Wound not examined No lower extremity edema  chest clear no added sound  Data Reviewed: personally reviewed   CBC    Component Value Date/Time   WBC 13.8 (H) 10/10/2020 0607   RBC 2.63 (L) 10/10/2020 0607   HGB 8.7 (L) 10/10/2020 0607   HCT 25.3 (L) 10/10/2020 0607   PLT 93 (L) 10/10/2020 0607   MCV 96.2 10/10/2020 0607   MCH 33.1 10/10/2020 0607   MCHC 34.4 10/10/2020 0607   RDW 13.8 10/10/2020 0607   LYMPHSABS 0.5 (L) 10/08/2020 1722   MONOABS 0.8 10/08/2020 1722   EOSABS 0.0 10/08/2020 1722   BASOSABS 0.0 10/08/2020 1722   CMP Latest Ref Rng & Units 10/10/2020 10/09/2020 10/08/2020  Glucose 70 - 99 mg/dL 12/09/2020) 270(W) 237(S)  BUN 8 - 23 mg/dL 23 22 283(T)  Creatinine 0.61 - 1.24 mg/dL 51(V 6.16 0.73  Sodium 135 - 145 mmol/L 131(L) 132(L) 134(L)  Potassium 3.5 - 5.1  mmol/L 4.2 4.5 4.5  Chloride 98 - 111 mmol/L 97(L) 96(L) 100  CO2 22 - 32 mmol/L 28 28 26   Calcium 8.9 - 10.3 mg/dL ) 9.2 7.6(L)  Total Protein 6.5 - 8.1 g/dL - 5.9(L) -  Total Bilirubin 0.3 - 1.2 mg/dL - 1.7(H) -  Alkaline Phos 38 - 126 U/L - 31(L) -  AST 15 - 41 U/L - 22 -  ALT 0 - 44 U/L - 7 -     Radiology Studies: CT HEAD WO CONTRAST  Result Date: 10/08/2020 CLINICAL DATA:  Head trauma from a fall. EXAM: CT HEAD  WITHOUT CONTRAST TECHNIQUE: Contiguous axial images were obtained from the base of the skull through the vertex without intravenous contrast. COMPARISON:  None. FINDINGS: Brain: No evidence of acute infarction, hemorrhage, hydrocephalus, extra-axial collection or mass lesion/mass effect. Mild cerebral atrophy. Patchy low-attenuation changes in the deep white matter consistent with small vessel ischemic change. Vascular: No hyperdense vessel or unexpected calcification. Skull: Calvarium appears intact. Sinuses/Orbits: Paranasal sinuses and mastoid air cells are clear. Other: None. IMPRESSION: No acute intracranial abnormalities. Chronic atrophy and small vessel ischemic changes. Electronically Signed   By: 12/09/2020 M.D.   On: 10/08/2020 20:30   CT PELVIS WO CONTRAST  Result Date: 10/08/2020 CLINICAL DATA:  Status post fall.  Right acetabular fracture. EXAM: CT PELVIS WITHOUT CONTRAST TECHNIQUE: Multidetector CT imaging of the pelvis was performed following the standard protocol without intravenous contrast. COMPARISON:  Hip films from earlier the same day FINDINGS: Urinary Tract:  Bladder is moderately distended. Bowel:  Unremarkable. Vascular/Lymphatic: Atherosclerotic calcification noted lower abdominal aorta and iliac arteries. Reproductive:  Prostate gland is enlarged. Other:  None. Musculoskeletal: Severely comminuted right acetabular fracture identified with fracture fragments distracted by at least 15 mm. Fracture involves the roof of the acetabulum, medial acetabulum and posterior lip of the acetabulum. Fracture extends cranially up into the iliac wing and anteriorly to involve the right superior pubic ramus. Associated comminuted fracture noted inferior right pubic ramus. Soft tissue windows demonstrate extensive hemorrhage in the extraperitoneal pelvic sidewall extending into the presacral space and right groin region. IMPRESSION: Severely comminuted right acetabular fracture involving the roof  of the acetabulum, medial acetabulum and posterior lip. Fracture extends anteriorly into the superior pubic ramus. Associated comminuted fracture noted right inferior pubic ramus. Extensive hemorrhage in the soft tissues adjacent to the right hip, involving the extraperitoneal right pelvic sidewall. Electronically Signed   By: 12/09/2020 M.D.   On: 10/08/2020 18:30   DG Pelvis Comp Min 3V  Result Date: 10/09/2020 CLINICAL DATA:  Status post open reduction and internal fixation of the pelvis. Obtained in PACU. EXAM: JUDET PELVIS - 3+ VIEW COMPARISON:  CT pelvis 10/08/2020 FINDINGS: Status post plate and screw fixation of a right acetabular fracture. No new acute displaced fracture identified. No pelvic diastasis. No dislocation of bilateral hips. No severe arthropathy or aggressive appearing focal bone lesion. Vascular calcifications. IMPRESSION: Status post plate and screw fixation of a right acetabular fracture. Electronically Signed   By: 12/09/2020 M.D.   On: 10/09/2020 16:04   DG Pelvis Comp Min 3V  Result Date: 10/09/2020 CLINICAL DATA:  Open reduction and internal fixation of right acetabular fracture. EXAM: DG C-ARM 1-60 MIN; JUDET PELVIS - 3+ VIEW CONTRAST:  None. FLUOROSCOPY TIME:  Radiation Exposure Index (if provided by the fluoroscopic device): 5.8 mGy. Number of Acquired Spot Images: 8. COMPARISON:  None. FINDINGS: Eight intraoperative fluoroscopic images were obtained of the right pelvis.  These images demonstrate surgical reduction and internal fixation of right acetabular fracture. Improved alignment of fracture components is noted. IMPRESSION: Fluoroscopic guidance provided during open reduction and internal fixation of right acetabular fracture. Electronically Signed   By: Lupita Raider M.D.   On: 10/09/2020 15:27   DG C-Arm 1-60 Min  Result Date: 10/09/2020 CLINICAL DATA:  Open reduction and internal fixation of right acetabular fracture. EXAM: DG C-ARM 1-60 MIN; JUDET PELVIS -  3+ VIEW CONTRAST:  None. FLUOROSCOPY TIME:  Radiation Exposure Index (if provided by the fluoroscopic device): 5.8 mGy. Number of Acquired Spot Images: 8. COMPARISON:  None. FINDINGS: Eight intraoperative fluoroscopic images were obtained of the right pelvis. These images demonstrate surgical reduction and internal fixation of right acetabular fracture. Improved alignment of fracture components is noted. IMPRESSION: Fluoroscopic guidance provided during open reduction and internal fixation of right acetabular fracture. Electronically Signed   By: Lupita Raider M.D.   On: 10/09/2020 15:27   DG Hip Unilat With Pelvis 2-3 Views Right  Result Date: 10/08/2020 CLINICAL DATA:  RIGHT hip pain status post fall EXAM: DG HIP (WITH OR WITHOUT PELVIS) 2-3V RIGHT COMPARISON:  None. FINDINGS: There is protrusion of the RIGHT femoral head through the medial border of the acetabulum. There is discontinuity of the iliopectineal line on the RIGHT. No RIGHT femoral neck fracture. IMPRESSION: Fracture through the medial wall of the RIGHT acetabulum with protrusion of the femoral head. Electronically Signed   By: Genevive Bi M.D.   On: 10/08/2020 17:17     Scheduled Meds:  acetaminophen  650 mg Oral Q12H   calcium-vitamin D  2 tablet Oral BID   carbidopa-levodopa  1 tablet Oral QHS   carbidopa-levodopa  1 tablet Oral QID   carvedilol  3.125 mg Oral BID WC   cholecalciferol  2,000 Units Oral BID   docusate sodium  100 mg Oral BID   enoxaparin (LOVENOX) injection  30 mg Subcutaneous Q24H   feeding supplement  237 mL Oral Q24H   multivitamin with minerals  1 tablet Oral Daily   Continuous Infusions:   LOS: 2 days   Time spent: 24  Rhetta Mura, MD [facility] To contact the attending provider between 7A-7P or the covering provider during after hours 7P-7A, please log into the web site [email or web address] and access using universal  password for that web site. If you do not have the  password, please call the hospital operator.  10/10/2020, 2:15 PM

## 2020-10-10 NOTE — Progress Notes (Signed)
Occupational Therapy Evaluation Patient Details Name: Greg [name] MRN: [id number] DOB: [birth year] Today's Date: 10/10/2020    History of Present Illness Greg [name] is a 87 y.o. male who presented to [facility] after mechanical fall and hip fracture now s/p ORIF R acetabulum (TWB & posterior hip precautions). Pt with medical history significant of Parkinson's disease poorly controlled, osteoporosis   Clinical Impression   Greg [name] was evaluated s/p the above R hip fracture. PTA pt was living at an ALF with his wife, he received some assistance for ADLs and was actively participating with PT several times a week prior to his fall. Upon evaluation pt was max A for bed mobility, with fair sitting EOB balance during functional grooming tasks. Pt would benefit from +2 assist for OOB transfers. Pt was educated on his RLE precautions (TDWB & post hip), and required max vc throughout to maintain WB. Pt would benefit from continued OT acutely to progress function in ADLs and mobility. Recommend pt d/c to SNF side of his current ALF, wife reports she has been in contact with their nurses at the ALF.     Follow Up Recommendations  SNF;Supervision/Assistance - 24 hour    Equipment Recommendations  None recommended by OT (defer to next venue)       Precautions / Restrictions Precautions Precautions: Posterior Hip;Fall Precaution Booklet Issued: Yes (comment) Precaution Comments: posterior hip Restrictions Weight Bearing Restrictions: Yes RLE Weight Bearing: Touchdown weight bearing      Mobility Bed Mobility Overal bed mobility: Needs Assistance Bed Mobility: Rolling;Sidelying to Sit;Sit to Supine Rolling: Min assist Sidelying to sit: Max assist;HOB elevated   Sit to supine: Max assist   General bed mobility comments: pt able to assist with use of bed rails, required assit for RLE, elevate trunk and verbal cues throughout for sequencing and proper positioning    Transfers Overall transfer level:  Needs assistance               General transfer comment: defer this session, need +2 assist for safety and WB precautions    Balance Overall balance assessment: Needs assistance;History of Falls Sitting-balance support: Feet supported;No upper extremity supported Sitting balance-Leahy Scale: Fair             ADL either performed or assessed with clinical judgement   ADL Overall ADL's : Needs assistance/impaired Eating/Feeding: Independent;Bed level   Grooming: Wash/dry hands;Wash/dry face;Applying deodorant;Oral care;Set up;Bed level   Upper Body Bathing: Minimal assistance;Bed level   Lower Body Bathing: Maximal assistance;Bed level   Upper Body Dressing : Minimal assistance;Sitting   Lower Body Dressing: Maximal assistance;Bed level;Adhering to hip precautions   Toilet Transfer: Maximal assistance;+2 for physical assistance;+2 for safety/equipment;BSC;Adhering to hip precautions   Toileting- Clothing Manipulation and Hygiene: Maximal assistance;+2 for physical assistance;+2 for safety/equipment;Sit to/from stand;Adhering to hip precautions       Functional mobility during ADLs: Maximal assistance;+2 for physical assistance;+2 for safety/equipment General ADL Comments: ADLs limited to bed level due to WB status and post hip precautions and baseline PD - pt able to assist by rolling     Vision Baseline Vision/History: Wears glasses Wears Glasses: At all times Patient Visual Report: No change from baseline        Pertinent Vitals/Pain Pain Assessment: 0-10 Pain Score: 6  Pain Location: R hip with movement Pain Descriptors / Indicators: Discomfort;Grimacing;Guarding Pain Intervention(s): Limited activity within patient's tolerance;Monitored during session     Hand Dominance Right   Extremity/Trunk Assessment Upper Extremity Assessment Upper Extremity Assessment: Generalized  weakness (shoulders limited in ROM)   Lower Extremity Assessment Lower Extremity  Assessment: Defer to PT evaluation   Cervical / Trunk Assessment Cervical / Trunk Assessment: Kyphotic   Communication Communication Communication: No difficulties   Cognition Arousal/Alertness: Awake/alert Behavior During Therapy: WFL for tasks assessed/performed;Flat affect Overall Cognitive Status: Within Functional Limits for tasks assessed             General Comments  VSS on RA, verbally reviewed all precuations, wife present and supportive for session     Home Living Family/patient expects to be discharged to:: Assisted living Living Arrangements: Spouse/significant other           Home Equipment: Shower seat - built in   Additional Comments: pt reports ALF has access to any medical equipment pt may need - should confirm/follow up with facility      Prior Functioning/Environment Level of Independence: Needs assistance    ADL's / Homemaking Assistance Needed: Pt's wife assisted minimally with bathing & dressing Communication / Swallowing Assistance Needed: [facility]          OT Problem List: Decreased strength;Decreased range of motion;Decreased activity tolerance;Impaired balance (sitting and/or standing);Decreased safety awareness;Decreased knowledge of precautions;Decreased knowledge of use of DME or AE;Pain      OT Treatment/Interventions: Self-care/ADL training;Therapeutic exercise;Therapeutic activities;Balance training;Patient/family education;DME and/or AE instruction    OT Goals(Current goals can be found in the care plan section) Acute Rehab OT Goals Patient Stated Goal: walk again OT Goal Formulation: With patient Time For Goal Achievement: 10/24/20 Potential to Achieve Goals: Fair ADL Goals Pt Will Perform Lower Body Bathing: with mod assist;sit to/from stand Pt Will Perform Lower Body Dressing: with mod assist;sit to/from stand Pt Will Transfer to Toilet: with mod assist;bedside commode;squat pivot transfer Pt Will Perform Toileting - Clothing  Manipulation and hygiene: with min guard assist;sitting/lateral leans  OT Frequency: Min 2X/week    AM-PAC OT "6 Clicks" Daily Activity     Outcome Measure Help from another person eating meals?: None Help from another person taking care of personal grooming?: A Little Help from another person toileting, which includes using toliet, bedpan, or urinal?: A Lot Help from another person bathing (including washing, rinsing, drying)?: A Lot Help from another person to put on and taking off regular upper body clothing?: A Little Help from another person to put on and taking off regular lower body clothing?: A Lot 6 Click Score: 16   End of Session Nurse Communication: Mobility status;Precautions;Weight bearing status  Activity Tolerance: Patient tolerated treatment well Patient left: in bed;with call bell/phone within reach;with bed alarm set;with family/visitor present  OT Visit Diagnosis: Unsteadiness on feet (R26.81);Other abnormalities of gait and mobility (R26.89);Muscle weakness (generalized) (M62.81);History of falling (Z91.81);Pain                Time: 2119-4174 OT Time Calculation (min): 33 min Charges:  OT General Charges $OT Visit: 1 Visit OT Evaluation $OT Eval Moderate Complexity: 1 Mod OT Treatments $Therapeutic Activity: 8-22 mins    Vivika Poythress A Claudie Rathbone 10/10/2020, 8:56 AM

## 2020-10-11 LAB — CBC
HCT: 21.8 % — ABNORMAL LOW (ref 39.0–52.0)
Hemoglobin: 7.4 g/dL — ABNORMAL LOW (ref 13.0–17.0)
MCH: 32.9 pg (ref 26.0–34.0)
MCHC: 33.9 g/dL (ref 30.0–36.0)
MCV: 96.9 fL (ref 80.0–100.0)
Platelets: 90 10*3/uL — ABNORMAL LOW (ref 150–400)
RBC: 2.25 MIL/uL — ABNORMAL LOW (ref 4.22–5.81)
RDW: 14.1 % (ref 11.5–15.5)
WBC: 10.1 10*3/uL (ref 4.0–10.5)
nRBC: 0 % (ref 0.0–0.2)

## 2020-10-11 LAB — COMPREHENSIVE METABOLIC PANEL
ALT: <5 U/L (ref 0–44)
AST: 34 U/L (ref 15–41)
Albumin: 2.6 g/dL — ABNORMAL LOW (ref 3.5–5.0)
Alkaline Phosphatase: 24 U/L — ABNORMAL LOW (ref 38–126)
Anion gap: 4 — ABNORMAL LOW (ref 5–15)
BUN: 26 mg/dL — ABNORMAL HIGH (ref 8–23)
CO2: 29 mmol/L (ref 22–32)
Calcium: 8.6 mg/dL — ABNORMAL LOW (ref 8.9–10.3)
Chloride: 96 mmol/L — ABNORMAL LOW (ref 98–111)
Creatinine, Ser: 1.14 mg/dL (ref 0.61–1.24)
GFR, Estimated: >60 mL/min (ref >60)
Glucose, Bld: 114 mg/dL — ABNORMAL HIGH (ref 70–99)
Potassium: 4.5 mmol/L (ref 3.5–5.1)
Sodium: 129 mmol/L — ABNORMAL LOW (ref 135–145)
Total Bilirubin: 1.1 mg/dL (ref 0.3–1.2)
Total Protein: 4.6 g/dL — ABNORMAL LOW (ref 6.5–8.1)

## 2020-10-11 LAB — OSMOLALITY, URINE: Osmolality, Ur: 501 mOsm/kg (ref 300–900)

## 2020-10-11 LAB — SODIUM, URINE, RANDOM: Sodium, Ur: 24 mmol/L

## 2020-10-11 NOTE — Progress Notes (Signed)
Physical Therapy Treatment Patient Details Name: Greg [name] MRN: [id number] DOB: 1931/12/20 Today's Date: 10/11/2020    History of Present Illness Greg [name] is a 87 y.o. male who presented to [facility] after mechanical fall and hip fracture now s/p ORIF R acetabulum (TWB & posterior hip precautions). Pt with medical history significant of Parkinson's disease poorly controlled, osteoporosis    PT Comments    Assisted pt back to bed; tolerated well with mod A x 2 and stand pivot toward L side.  Cues for hip precautions and sequencing.  Pt with good pain control.  Continue plan of care - reports he think he is discharging tomorrow.     Follow Up Recommendations  SNF     Equipment Recommendations  None recommended by PT    Recommendations for Other Services       Precautions / Restrictions Precautions Precautions: Posterior Hip;Fall Precaution Booklet Issued: Yes (comment) Precaution Comments: posterior hip Restrictions RLE Weight Bearing: Touchdown weight bearing    Mobility  Bed Mobility Overal bed mobility: Needs Assistance Bed Mobility: Sit to Supine     Supine to sit: Mod assist Sit to supine: Mod assist;+2 for physical assistance   General bed mobility comments: Mod A for bil LE and to turn trunk; increased time and cues for sequencing and scooting    Transfers Overall transfer level: Needs assistance Equipment used: Rolling walker (2 wheeled) Transfers: Sit to/from [name] Sit to Stand: Mod assist;+2 physical assistance Stand pivot transfers: +2 physical assistance;Mod assist       General transfer comment: Cues for hand placement and R LE management; R LE on therapist foot to monitor TDWB; increased time and cues for sequencing.  Pt did fair with TDWB status during stand pivot toward strong side  Ambulation/Gait             General Gait Details: unable   Stairs             Wheelchair Mobility    Modified Rankin (Stroke  Patients Only)       Balance Overall balance assessment: Needs assistance;History of Falls Sitting-balance support: Feet supported;No upper extremity supported Sitting balance-Leahy Scale: Fair     Standing balance support: Bilateral upper extremity supported Standing balance-Leahy Scale: Poor Standing balance comment: requiring RW and min A of 2 with static stand                            Cognition Arousal/Alertness: Awake/alert Behavior During Therapy: WFL for tasks assessed/performed Overall Cognitive Status: Within Functional Limits for tasks assessed                                 General Comments: Recalled 2/3 hip precautions and TDWB status      Exercises     General Comments General comments (skin integrity, edema, etc.): wife present      Pertinent Vitals/Pain Pain Assessment: 0-10 Pain Score: 2  Pain Location: R hip with transfers Pain Descriptors / Indicators: Discomfort Pain Intervention(s): Limited activity within patient's tolerance;Monitored during session    Home Living                      Prior Function            PT Goals (current goals can now be found in the care plan section) Acute Rehab PT Goals Patient Stated  Goal: walk again PT Goal Formulation: With patient/family Time For Goal Achievement: 10/24/20 Potential to Achieve Goals: Good Progress towards PT goals: Progressing toward goals    Frequency    Min 3X/week      PT Plan Current plan remains appropriate    Co-evaluation              AM-PAC PT "6 Clicks" Mobility   Outcome Measure  Help needed turning from your back to your side while in a flat bed without using bedrails?: A Lot Help needed moving from lying on your back to sitting on the side of a flat bed without using bedrails?: A Lot Help needed moving to and from a bed to a chair (including a wheelchair)?: Total Help needed standing up from a chair using your arms (e.g.,  wheelchair or bedside chair)?: Total Help needed to walk in hospital room?: Total Help needed climbing 3-5 steps with a railing? : Total 6 Click Score: 8    End of Session Equipment Utilized During Treatment: Gait belt Activity Tolerance: Patient tolerated treatment well Patient left: with call bell/phone within reach;with family/visitor present;with chair alarm set;in chair Nurse Communication: Mobility status PT Visit Diagnosis: Unsteadiness on feet (R26.81);Muscle weakness (generalized) (M62.81);History of falling (Z91.81);Difficulty in walking, not elsewhere classified (R26.2);Pain Pain - Right/Left: Right Pain - part of body: Hip     Time: 1950-9326 PT Time Calculation (min) (ACUTE ONLY): 18 min  Charges:  $Therapeutic Exercise: 8-22 mins $Therapeutic Activity: 8-22 mins                     Greg [name], PT Acute Rehab Services Pager [phone] [name] [phone]    Greg [name] 10/11/2020, 2:41 PM

## 2020-10-11 NOTE — TOC CAGE-AID Note (Signed)
Transition of Care [facility]) - CAGE-AID Screening   Patient Details  Name: Greg [name] MRN: [id number] Date of Birth: 11/18/1931  Transition of Care [facility]) CM/SW Contact:    Brier Firebaugh C Tarpley-Carter, LCSWA Phone Number: 10/11/2020, 3:18 PM   Clinical Narrative: Pt participated in Cage-Aid.  Pt does not use substance or alcohol.  Velda Wendt Tarpley-Carter, MSW, LCSW-A Pronouns:  She/Her/Hers                          [place] Clinical Social WorkerTransitions of Care Cell:  [phone] [email or web address]    CAGE-AID Screening:    Have You Ever Felt You Ought to Cut Down on Your Drinking or Drug Use?: No Have People Annoyed You By [employer] Your Drinking Or Drug Use?: No Have You Felt Bad Or Guilty About Your Drinking Or Drug Use?: No Have You Ever Had a Drink or Used Drugs First Thing In The Morning to Steady Your Nerves or to Get Rid of a Hangover?: No CAGE-AID Score: 0  Substance Abuse Education Offered: No

## 2020-10-11 NOTE — Progress Notes (Signed)
PROGRESS NOTE   Greg [name]  QVZ:[id number] DOB: November 18, 1931 DOA: 10/08/2020 PCP: [name], MD  Brief Narrative:  87 year old white male resident friend's home Parkinson's disease Diagnosed 2019 followed previously [place] [facility]-currently follows Dr. Lurena Joiner [name] neurology (since 08/07/2020) Urinary frequency, osteoarthritis found, osteopenia, hyperlipidemia  Followed at facility after helping wife-fell backward hit head, hit right thigh-unable to get up because of right hip pain-Dr. Swinteck consulted for right acetabular fracture-recommended Buck's traction-  7/5 for ORIF with Ortho   Hospital-Problem based course  Right acetabular fracture mechanical fall status post ORIF 7/5 Defer planning per orthopedics-pain seems controlled Likely SNF if all labs corrected in am-COVID ordered HTN-blood pressure elevated secondary to possibly postop pain Continue Coreg 3.125 Adjust if drops--resolving Prediabetes-A1c 5.9 Avoid stringent control Euvol hyponatremia Get uOsm/Una--patiet drinking> eating Encouraged boost/ensure Recheck labs am Sodium needs to correct slightly prior to d/c Parkinson's disease Continue Sinemet IR 4 times daily, Sinemet CR at bedtime Leukocytosis NOS, mild thrombocytopenia Monitor trends, continue I-S-resovling--was post-op Anemia of expected blood loss as is postop Labs in a.m. Not meeting transfusion threshold  DVT prophylaxis: Lovenox Code Status: Full Family Communication: Discussed with wife at bedside Disposition:  Status is: Inpatient  Remains inpatient appropriate because:Persistent severe electrolyte disturbances, Unsafe d/c plan, and Inpatient level of care appropriate due to severity of illness  Dispo: The patient is from: SNF              Anticipated d/c is to: SNF              Patient currently is not medically stable to d/c.   Difficult to place patient No       Consultants:  Orthopedics  Procedures: ORIF  7/5  Antimicrobials: Perioperative   Subjective:  Not eating much Pain moderate 5/10 Worked with PT and sat at bedside last pm Wife bedside No fever no chills no cough cold but some food got "hung" this am   Objective: Vitals:   10/10/20 1532 10/10/20 2012 10/11/20 0510 10/11/20 0735  BP: 100/76 (!) 101/52 (!) 111/56 112/60  Pulse: 84 77 83 81  Resp: 17 18 18 17   Temp: 98.1 F (36.7 C) 98 F (36.7 C) 98.7 F (37.1 C) 98.8 F (37.1 C)  TempSrc: Oral Oral Oral Oral  SpO2: 97% 94% 92% 92%    Intake/Output Summary (Last 24 hours) at 10/11/2020 1106 Last data filed at 10/11/2020 1052 Gross per 24 hour  Intake 550 ml  Output 1400 ml  Net -850 ml    There were no vitals filed for this visit.  Examination:  Masked face Slow speech S1 s 2no m Cta b no rales rhonchi Abd soft No le edema  Data Reviewed: personally reviewed   CBC    Component Value Date/Time   WBC 10.1 10/11/2020 0037   RBC 2.25 (L) 10/11/2020 0037   HGB 7.4 (L) 10/11/2020 0037   HCT 21.8 (L) 10/11/2020 0037   PLT 90 (L) 10/11/2020 0037   MCV 96.9 10/11/2020 0037   MCH 32.9 10/11/2020 0037   MCHC 33.9 10/11/2020 0037   RDW 14.1 10/11/2020 0037   LYMPHSABS 0.5 (L) 10/08/2020 1722   MONOABS 0.8 10/08/2020 1722   EOSABS 0.0 10/08/2020 1722   BASOSABS 0.0 10/08/2020 1722   CMP Latest Ref Rng & Units 10/11/2020 10/10/2020 10/09/2020  Glucose 70 - 99 mg/dL 12/10/2020) 329(J) 188(C)  BUN 8 - 23 mg/dL 166(A) 23 22  Creatinine 0.61 - 1.24 mg/dL 63(K 1.60 1.09  Sodium  135 - 145 mmol/L 129(L) 131(L) 132(L)  Potassium 3.5 - 5.1 mmol/L 4.5 4.2 4.5  Chloride 98 - 111 mmol/L 96(L) 97(L) 96(L)  CO2 22 - 32 mmol/L 29 28 28   Calcium 8.9 - 10.3 mg/dL ) 5.9(D) 9.2  Total Protein 6.5 - 8.1 g/dL 4.6(L) - 5.9(L)  Total Bilirubin 0.3 - 1.2 mg/dL 1.1 - 1.7(H)  Alkaline Phos 38 - 126 U/L 24(L) - 31(L)  AST 15 - 41 U/L 34 - 22  ALT 0 - 44 U/L <5 - 7     Radiology Studies: DG Pelvis Comp Min 3V  Result Date:  10/09/2020 CLINICAL DATA:  Status post open reduction and internal fixation of the pelvis. Obtained in PACU. EXAM: JUDET PELVIS - 3+ VIEW COMPARISON:  CT pelvis 10/08/2020 FINDINGS: Status post plate and screw fixation of a right acetabular fracture. No new acute displaced fracture identified. No pelvic diastasis. No dislocation of bilateral hips. No severe arthropathy or aggressive appearing focal bone lesion. Vascular calcifications. IMPRESSION: Status post plate and screw fixation of a right acetabular fracture. Electronically Signed   By: 12/09/2020 M.D.   On: 10/09/2020 16:04   DG Pelvis Comp Min 3V  Result Date: 10/09/2020 CLINICAL DATA:  Open reduction and internal fixation of right acetabular fracture. EXAM: DG C-ARM 1-60 MIN; JUDET PELVIS - 3+ VIEW CONTRAST:  None. FLUOROSCOPY TIME:  Radiation Exposure Index (if provided by the fluoroscopic device): 5.8 mGy. Number of Acquired Spot Images: 8. COMPARISON:  None. FINDINGS: Eight intraoperative fluoroscopic images were obtained of the right pelvis. These images demonstrate surgical reduction and internal fixation of right acetabular fracture. Improved alignment of fracture components is noted. IMPRESSION: Fluoroscopic guidance provided during open reduction and internal fixation of right acetabular fracture. Electronically Signed   By: 12/10/2020 M.D.   On: 10/09/2020 15:27   DG C-Arm 1-60 Min  Result Date: 10/09/2020 CLINICAL DATA:  Open reduction and internal fixation of right acetabular fracture. EXAM: DG C-ARM 1-60 MIN; JUDET PELVIS - 3+ VIEW CONTRAST:  None. FLUOROSCOPY TIME:  Radiation Exposure Index (if provided by the fluoroscopic device): 5.8 mGy. Number of Acquired Spot Images: 8. COMPARISON:  None. FINDINGS: Eight intraoperative fluoroscopic images were obtained of the right pelvis. These images demonstrate surgical reduction and internal fixation of right acetabular fracture. Improved alignment of fracture components is noted.  IMPRESSION: Fluoroscopic guidance provided during open reduction and internal fixation of right acetabular fracture. Electronically Signed   By: 12/10/2020 M.D.   On: 10/09/2020 15:27     Scheduled Meds:  calcium-vitamin D  2 tablet Oral BID   carbidopa-levodopa  1 tablet Oral QHS   carbidopa-levodopa  1 tablet Oral QID   carvedilol  3.125 mg Oral BID WC   cholecalciferol  2,000 Units Oral BID   docusate sodium  100 mg Oral BID   enoxaparin (LOVENOX) injection  30 mg Subcutaneous Q24H   feeding supplement  237 mL Oral Q24H   multivitamin with minerals  1 tablet Oral Daily   Continuous Infusions:   LOS: 3 days   Time spent: 33  30, MD [facility] To contact the attending provider between 7A-7P or the covering provider during after hours 7P-7A, please log into the web site [email or web address] and access using universal [place] password for that web site. If you do not have the password, please call the hospital operator.  10/11/2020, 11:06 AM

## 2020-10-11 NOTE — NC FL2 (Signed)
[place] MEDICAID FL2 LEVEL OF CARE SCREENING TOOL     IDENTIFICATION  Patient Name: Greg [name] Birthdate: 1931/11/14 Sex: male Admission Date (Current Location): 10/08/2020  [facility] and [place] Number:  Producer, television/film/video and Address:  [facility], [address] [place], [place] [address]      Provider Number: [id number]  Attending Physician Name and Address:  Greg Mura, MD  Relative Name and Phone Number:  Greg [name] (Spouse)   [phone] (Mobile)    Current Level of Care: Hospital Recommended Level of Care: Skilled Nursing Facility Prior Approval Number:    Date Approved/Denied:   PASRR Number: [id number]  Discharge Plan: SNF    Current Diagnoses: Patient Active Problem List   Diagnosis Date Noted   Closed displaced fracture of right acetabulum [facility])    Closed right hip fracture (HCC) 10/08/2020   Parkinson's disease (HCC) 09/25/2020   BPH (benign prostatic hyperplasia) 09/25/2020   Peripheral edema 09/25/2020   Osteoarthritis, multiple sites 09/25/2020   Osteoporosis 09/25/2020   Hyperlipidemia 09/25/2020    Orientation RESPIRATION BLADDER Height & Weight     Self, Situation, Place  Normal External catheter Weight:   Height:     BEHAVIORAL SYMPTOMS/MOOD NEUROLOGICAL BOWEL NUTRITION STATUS      Continent Diet (See d/c summary)  AMBULATORY STATUS COMMUNICATION OF NEEDS Skin   Extensive Assist Verbally Surgical wounds (Incision right abdomen)                       Personal Care Assistance Level of Assistance  Bathing, Feeding, Dressing Bathing Assistance: Maximum assistance Feeding assistance: Independent Dressing Assistance: Maximum assistance     Functional Limitations Info  Sight, Speech, Hearing Sight Info: Adequate Hearing Info: Impaired Speech Info: Adequate    SPECIAL CARE FACTORS FREQUENCY  PT (By licensed PT), OT (By licensed OT)     PT Frequency: 5x/week OT Frequency: 5x/week             Contractures Contractures Info: Not present    Additional Factors Info  Code Status, Allergies Code Status Info: Full code Allergies Info: NKA           Current Medications (10/11/2020):  This is the current hospital active medication list Current Facility-Administered Medications  Medication Dose Route Frequency Provider Last Rate Last Admin   acetaminophen (TYLENOL) tablet 325-650 mg  325-650 mg Oral Q6H PRN Greg Morita, PA-C       acetaminophen (TYLENOL) tablet 650 mg  650 mg Oral Q12H Greg Morita, PA-C   650 mg at 10/10/20 2123   calcium-vitamin D (OSCAL WITH D) 500-200 MG-UNIT per tablet 2 tablet  2 tablet Oral BID Greg Morita, PA-C   2 tablet at 10/10/20 2123   carbidopa-levodopa (SINEMET CR) 50-200 MG per tablet controlled release 1 tablet  1 tablet Oral QHS Greg Morita, PA-C   1 tablet at 10/10/20 2124   carbidopa-levodopa (SINEMET IR) 25-100 MG per tablet immediate release 1 tablet  1 tablet Oral QID Greg Morita, PA-C   1 tablet at 10/11/20 0804   carvedilol (COREG) tablet 3.125 mg  3.125 mg Oral BID WC Greg Morita, PA-C   3.125 mg at 10/11/20 4315   cholecalciferol (VITAMIN D3) tablet 2,000 Units  2,000 Units Oral BID Greg Morita, PA-C   2,000 Units at 10/10/20 2123   docusate sodium (COLACE) capsule 100 mg  100 mg Oral BID Greg Morita, PA-C   100 mg at 10/10/20 2124   enoxaparin (  LOVENOX) injection 30 mg  30 mg Subcutaneous Q24H Greg Morita, PA-C   30 mg at 10/10/20 1013   feeding supplement (ENSURE ENLIVE / ENSURE PLUS) liquid 237 mL  237 mL Oral Q24H Greg [name]., MD   237 mL at 10/10/20 1545   HYDROcodone-acetaminophen (NORCO/VICODIN) 5-325 MG per tablet 1-2 tablet  1-2 tablet Oral Q6H PRN Greg Morita, PA-C   1 tablet at 10/10/20 0548   HYDROmorphone (DILAUDID) injection 0.5 mg  0.5 mg Intravenous Q2H PRN Greg Morita, PA-C       morphine 2 MG/ML injection 0.5-1 mg  0.5-1 mg Intravenous Q2H PRN Greg Morita, PA-C       multivitamin with minerals tablet 1  tablet  1 tablet Oral Daily Greg [name]., MD   1 tablet at 10/10/20 1013   ondansetron (ZOFRAN) tablet 4 mg  4 mg Oral Q6H PRN Greg Morita, PA-C       Or   ondansetron [facility]) injection 4 mg  4 mg Intravenous Q6H PRN Greg Morita, PA-C         Discharge Medications: Please see discharge summary for a list of discharge medications.  Relevant Imaging Results:  Relevant Lab Results:   Additional Information SSN [id number] Pt is covid vaccinated x4  Greg [name] P Greg Picklesimer, LCSW

## 2020-10-11 NOTE — Progress Notes (Signed)
Physical Therapy Treatment Patient Details Name: Greg [name] MRN: [id number] DOB: Jan 27, 1932 Today's Date: 10/11/2020    History of Present Illness Greg [name] is a 87 y.o. male who presented to [facility] after mechanical fall and hip fracture now s/p ORIF R acetabulum (TWB & posterior hip precautions). Pt with medical history significant of Parkinson's disease poorly controlled, osteoporosis    PT Comments    Pt making good progress today - able to stand and pivot to chair with mod A of 2.  Required cues for TDWB and hip precautions.  Pt is complex transfer with Wbing, hip precautions and level of assist required - PT will return to assist back to bed. Do not recommend maximove due to posterior hip precautions.     Follow Up Recommendations  SNF     Equipment Recommendations  None recommended by PT    Recommendations for Other Services       Precautions / Restrictions Precautions Precautions: Posterior Hip;Fall Precaution Booklet Issued: Yes (comment) Precaution Comments: posterior hip Restrictions RLE Weight Bearing: Touchdown weight bearing    Mobility  Bed Mobility Overal bed mobility: Needs Assistance Bed Mobility: Supine to Sit     Supine to sit: Mod assist     General bed mobility comments: Mod A for R LE and to lift trunk; increased time and cues for sequencing and scooting    Transfers Overall transfer level: Needs assistance Equipment used: Rolling walker (2 wheeled) Transfers: Sit to/from [name] Sit to Stand: Mod assist;+2 physical assistance Stand pivot transfers: +2 physical assistance;Mod assist       General transfer comment: Cues for hand placement and R LE management; R LE on therapist foot to monitor TDWB; increased time and cues for sequencing.  Pt did fair with TDWB status during stand pivot  Ambulation/Gait             General Gait Details: unable   Stairs             Wheelchair Mobility    Modified  Rankin (Stroke Patients Only)       Balance Overall balance assessment: Needs assistance;History of Falls Sitting-balance support: Feet supported;No upper extremity supported Sitting balance-Leahy Scale: Fair     Standing balance support: Bilateral upper extremity supported Standing balance-Leahy Scale: Poor Standing balance comment: requiring RW and min A of 2 with static stand                            Cognition Arousal/Alertness: Awake/alert Behavior During Therapy: WFL for tasks assessed/performed Overall Cognitive Status: Within Functional Limits for tasks assessed                                 General Comments: Recalled 2/3 hip precautions and TDWB status      Exercises General Exercises - Lower Extremity Ankle Circles/Pumps: AROM;Both;10 reps;Supine Short Arc Quad: AROM;Both;10 reps;Supine Long Arc Quad: AROM;Left;AAROM;Right;10 reps;Seated Heel Slides: AROM;Left;AAROM;Right;10 reps;Supine Hip ABduction/ADduction: AROM;Right;AAROM;Left;10 reps;Supine Other Exercises Other Exercises: cues and assist for exercises    General Comments General comments (skin integrity, edema, etc.): wife present      Pertinent Vitals/Pain Pain Assessment: 0-10 Pain Score: 2  Pain Location: R hip with transfers Pain Descriptors / Indicators: Discomfort Pain Intervention(s): Limited activity within patient's tolerance;Monitored during session;Repositioned;Ice applied    Home Living  Prior Function            PT Goals (current goals can now be found in the care plan section) Acute Rehab PT Goals Patient Stated Goal: walk again PT Goal Formulation: With patient/family Time For Goal Achievement: 10/24/20 Potential to Achieve Goals: Good Progress towards PT goals: Progressing toward goals    Frequency    Min 3X/week      PT Plan Frequency needs to be updated    Co-evaluation              AM-PAC PT "6  Clicks" Mobility   Outcome Measure  Help needed turning from your back to your side while in a flat bed without using bedrails?: A Lot Help needed moving from lying on your back to sitting on the side of a flat bed without using bedrails?: A Lot Help needed moving to and from a bed to a chair (including a wheelchair)?: Total Help needed standing up from a chair using your arms (e.g., wheelchair or bedside chair)?: Total Help needed to walk in hospital room?: Total Help needed climbing 3-5 steps with a railing? : Total 6 Click Score: 8    End of Session Equipment Utilized During Treatment: Gait belt Activity Tolerance: Patient tolerated treatment well Patient left: with call bell/phone within reach;with family/visitor present;with chair alarm set;in chair Nurse Communication: Mobility status;Other (comment) (PT will return to assist with transfer back to bed) PT Visit Diagnosis: Unsteadiness on feet (R26.81);Muscle weakness (generalized) (M62.81);History of falling (Z91.81);Difficulty in walking, not elsewhere classified (R26.2);Pain Pain - Right/Left: Right Pain - part of body: Hip     Time: 3500-9381 PT Time Calculation (min) (ACUTE ONLY): 30 min  Charges:  $Therapeutic Exercise: 8-22 mins $Therapeutic Activity: 8-22 mins                     Anise Salvo, PT Acute Rehab Services Pager [phone] [facility] [phone]    Rayetta Humphrey 10/11/2020, 12:01 PM

## 2020-10-11 NOTE — TOC Initial Note (Addendum)
Transition of Care [facility]) - Initial/Assessment Note    Patient Details  Name: Greg [name] MRN: [id number] Date of Birth: 14-Apr-1931  [facility]) CM/SW Contact:    Bethann Berkshire, [place] Phone Number: 10/11/2020, 10:11 AM  Clinical Narrative:                  CSW met with pt and pt spouse bedside. Discussed SNF recommendation. [facility]. Pt and spouse agreeable to SNF with [facility]. Pt is vaccinated x4. CSW called and left message with Z[facility] Seal at [facility] requesting return call. CSW to complete fl2 and fax bed request in hub.   1023: Zack Seal called back and confirmed they can accept pt tomorrow. Will need covid test within 48 hours of DC.   Expected Discharge Plan: Skilled Nursing Facility Barriers to Discharge: Continued Medical Work up   Patient Goals and CMS Choice Patient states their goals for this hospitalization and ongoing recovery are:: [facility] CMS Medicare.gov Compare Post Acute Care list provided to:: Patient Choice offered to / list presented to : Patient, Spouse  Expected Discharge Plan and Services Expected Discharge Plan: [place]       Living arrangements for the past 2 months: [place]                                      Prior Living Arrangements/Services Living arrangements for the past 2 months: [place] Lives with:: Spouse Patient language and need for interpreter reviewed:: Yes Do you feel safe going back to the place where you live?: Yes      Need for Family Participation in Patient Care: Yes (Comment) Care giver support system in place?: Yes (comment)   Criminal Activity/Legal Involvement Pertinent to Current Situation/Hospitalization: No - Comment as needed  Activities of Daily Living Home Assistive Devices/Equipment: Eyeglasses ADL Screening (condition at time of admission) Patient's cognitive ability adequate to  safely complete daily activities?: Yes Is the patient deaf or have difficulty hearing?: Yes Does the patient have difficulty seeing, even when wearing glasses/contacts?: No Does the patient have difficulty concentrating, remembering, or making decisions?: No Patient able to express need for assistance with ADLs?: Yes Does the patient have difficulty dressing or bathing?: No Independently performs ADLs?: Yes (appropriate for developmental age) Does the patient have difficulty walking or climbing stairs?: No Weakness of Legs: None Weakness of Arms/Hands: None  Permission Sought/Granted   Permission granted to share information with : Yes, Verbal Permission Granted  Share Information with NAME: Mergen, jane (Spouse)   [phone] (Mobile)           Emotional Assessment Appearance:: Appears stated age Attitude/Demeanor/Rapport: Lethargic Affect (typically observed): Appropriate Orientation: : Oriented to Self, Oriented to Place, Oriented to Situation Alcohol / Substance Use: Not Applicable Psych Involvement: No (comment)  Admission diagnosis:  Closed right hip fracture ([place]) [S72.001A] Closed displaced fracture of right acetabulum, unspecified portion of acetabulum, initial encounter ([place]) [S32.401A] Closed nondisplaced fracture of right pubis, initial encounter [facility]) [S32.501A] Patient Active Problem List   Diagnosis Date Noted   Closed displaced fracture of right acetabulum ([place])    Closed right hip fracture ([place]) 10/08/2020   Parkinson's disease ([place]) 09/25/2020   BPH (benign prostatic hyperplasia) 09/25/2020   Peripheral edema 09/25/2020   Osteoarthritis, multiple sites 09/25/2020   Osteoporosis 09/25/2020  Hyperlipidemia 09/25/2020   PCP:  Virgie Dad, MD Pharmacy:   [facility] [id number] - [address] [place] [address] [place] [address] Phone: [phone] Fax: [phone]     Social Determinants of Health (SDOH)  Interventions    Readmission Risk Interventions No flowsheet data found.

## 2020-10-12 LAB — RENAL FUNCTION PANEL
Albumin: 2.6 g/dL — ABNORMAL LOW (ref 3.5–5.0)
Anion gap: 7 (ref 5–15)
BUN: 24 mg/dL — ABNORMAL HIGH (ref 8–23)
CO2: 28 mmol/L (ref 22–32)
Calcium: 8.4 mg/dL — ABNORMAL LOW (ref 8.9–10.3)
Chloride: 97 mmol/L — ABNORMAL LOW (ref 98–111)
Creatinine, Ser: 1.14 mg/dL (ref 0.61–1.24)
GFR, Estimated: >60 mL/min (ref >60)
Glucose, Bld: 122 mg/dL — ABNORMAL HIGH (ref 70–99)
Phosphorus: 3.5 mg/dL (ref 2.5–4.6)
Potassium: 4.1 mmol/L (ref 3.5–5.1)
Sodium: 132 mmol/L — ABNORMAL LOW (ref 135–145)

## 2020-10-12 LAB — CBC WITH DIFFERENTIAL/PLATELET
Abs Immature Granulocytes: 0.07 10*3/uL (ref 0.00–0.07)
Basophils Absolute: 0 10*3/uL (ref 0.0–0.1)
Basophils Relative: 0 %
Eosinophils Absolute: 0.1 10*3/uL (ref 0.0–0.5)
Eosinophils Relative: 1 %
HCT: 22.8 % — ABNORMAL LOW (ref 39.0–52.0)
Hemoglobin: 7.8 g/dL — ABNORMAL LOW (ref 13.0–17.0)
Immature Granulocytes: 1 %
Lymphocytes Relative: 11 %
Lymphs Abs: 0.9 10*3/uL (ref 0.7–4.0)
MCH: 33.1 pg (ref 26.0–34.0)
MCHC: 34.2 g/dL (ref 30.0–36.0)
MCV: 96.6 fL (ref 80.0–100.0)
Monocytes Absolute: 1.1 10*3/uL — ABNORMAL HIGH (ref 0.1–1.0)
Monocytes Relative: 13 %
Neutro Abs: 6.4 10*3/uL (ref 1.7–7.7)
Neutrophils Relative %: 74 %
Platelets: 116 10*3/uL — ABNORMAL LOW (ref 150–400)
RBC: 2.36 MIL/uL — ABNORMAL LOW (ref 4.22–5.81)
RDW: 14 % (ref 11.5–15.5)
WBC: 8.5 10*3/uL (ref 4.0–10.5)
nRBC: 0 % (ref 0.0–0.2)

## 2020-10-12 LAB — SARS CORONAVIRUS 2 (TAT 6-24 HRS): SARS Coronavirus 2: NEGATIVE

## 2020-10-12 MED ORDER — CARVEDILOL 3.125 MG PO TABS: 3.1250 mg | ORAL_TABLET | Freq: Two times a day (BID) | ORAL | Status: DC

## 2020-10-12 MED ORDER — ACETAMINOPHEN 325 MG PO TABS: 325.0000 mg | ORAL_TABLET | Freq: Four times a day (QID) | ORAL | Status: DC | PRN

## 2020-10-12 MED ORDER — SENNA 8.6 MG PO TABS: 1.0000 | ORAL_TABLET | Freq: Every day | ORAL | 0 refills | Status: DC

## 2020-10-12 MED ORDER — HYDROCODONE-ACETAMINOPHEN 5-325 MG PO TABS: 1.0000 | ORAL_TABLET | Freq: Four times a day (QID) | ORAL | 0 refills | Status: DC | PRN

## 2020-10-12 MED ORDER — POLYETHYLENE GLYCOL 3350 17 G PO PACK: 17.0000 g | PACK | Freq: Every day | ORAL | 0 refills | Status: DC

## 2020-10-12 MED ORDER — ENOXAPARIN SODIUM 30 MG/0.3ML IJ SOSY: 30.0000 mg | PREFILLED_SYRINGE | INTRAMUSCULAR | Status: DC

## 2020-10-12 NOTE — Progress Notes (Addendum)
Patient discharged to [facility] via [place], wife was at bedside, instructions was read tp nurse Louie Bun at [facility].

## 2020-10-12 NOTE — TOC Transition Note (Signed)
Transition of Care [facility]) - CM/SW Discharge Note   Patient Details  Name: Greg [name] MRN: [id number] Date of Birth: [birth year]  [facility] CM/SW Contact:  Carley Hammed, LCSWA Phone Number: 10/12/2020, 12:17 PM   Clinical Narrative:    Pt to be transported via [facility] to [facility]. Nurse to call report to [phone] ext. [phone].   Final next level of care: Skilled Nursing Facility Barriers to Discharge: Barriers Resolved   Patient Goals and CMS Choice Patient states their goals for this hospitalization and ongoing recovery are:: [facility] CMS Medicare.gov Compare Post Acute Care list provided to:: Patient Choice offered to / list presented to : Patient, Spouse  Discharge Placement              Patient chooses bed at: [facility] Patient to be transferred to facility by: [facility] Name of family member notified: Luana Shu Patient and family notified of of transfer: 10/12/20  Discharge Plan and Services                                     Social Determinants of Health (SDOH) Interventions     Readmission Risk Interventions No flowsheet data found.

## 2020-10-12 NOTE — Progress Notes (Signed)
Attempted to call [facility] X2 to give report but no answer, will try again.

## 2020-10-12 NOTE — Discharge Summary (Signed)
Physician Discharge Summary  Greg [name] OHY:[id number] DOB: 1932-01-13 DOA: 10/08/2020  PCP: Greg Gammon, MD  Admit date: 10/08/2020 Discharge date: 10/12/2020  Time spent: 27 minutes  Recommendations for Outpatient Follow-up:  Pain control Tylenol first choice, opiates second choice for pain above 5/10 Keep on proper bowel regimen MiraLAX and senna Lovenox at least for 21 days ending on 10/29/2020 for DVT prophylaxis--May substitute with Xarelto at prophylactic dosing in the outpatient setting Touchdown weightbearing right leg X 8 weeks with posterior hip precautions 12 weeks Outpatient discussion and consideration for prediabetes and initiate metformin  Discharge Diagnoses:  MAIN problem for hospitalization  Acute hip fracture  Please see below for itemized issues addressed in HOpsital- refer to other progress notes for clarity if needed  Discharge Condition: Improved  Diet recommendation: Regular  Filed Weights   10/12/20 0500  Weight: 87.1 kg    History of present illness:  87 year old white male resident friend's home Parkinson's disease Diagnosed 2019 followed previously [facility]-currently follows Dr. Lurena Joiner [name] neurology (since 08/07/2020) Urinary frequency, osteoarthritis found, osteopenia, hyperlipidemia   Followed at facility after helping wife-fell backward hit head, hit right thigh-unable to get up because of right hip pain-Dr. Swinteck consulted for right acetabular fracture-recommended Buck's traction-   7/5 for ORIF with Ortho and had some mild hyponatremia in the hospital setting  Hospital Course:    Right acetabular fracture mechanical fall status post ORIF 7/5 Defer planning per orthopedics-pain seems controlled Instructions as above per Ortho-consider Xarelto as above He has had moderate to severe constipation and we initiated MiraLAX and senna on discharge-see below regarding iron discussion HTN-blood pressure elevated secondary  to possibly postop pain Continue Coreg 3.125 This was started during hospital stay probably because of postop pain and can be discontinued Prediabetes-A1c 5.9 Avoid stringent control Euvol hyponatremia Get uOsm/Una--patiet drinking> eating Encouraged boost/ensure And increasing his p.o. solute intake and his sodium improved to the 132 range Parkinson's disease Continue Sinemet IR 4 times daily, Sinemet CR at bedtime Leukocytosis NOS, mild thrombocytopenia Monitor trends, continue I-S-resovling- Anemia of expected blood loss as is postop Hemoglobin is reasonable and expected postop losses down from 10 to about 8 Not meeting transfusion threshold Will prescribe iron twice daily on discharge Will need iron prescribed at facility once his constipation  Procedures: ORIF acetabular fracture right side Dr. Marcello [name] 10/09/2020  Consultations: Orthopedics  Discharge Exam: Vitals:   10/12/20 0351 10/12/20 0410  BP: (!) 148/74 (!) 116/58  Pulse: 89 83  Resp:  18  Temp:  99.5 F (37.5 C)  SpO2: 96% 93%    Subj on day of d/c   Awake coherent alert passing some stool no distress states pain is reasonable set up for 2 hours yesterday wife at bedside  General Exam on discharge  EOMI NCAT no focal deficit flat affect ROM intact Chest clear S1-S2 no murmur No lower extremity edema   Discharge Instructions   Discharge Instructions     Diet - low sodium heart healthy   Complete by: As directed    Increase activity slowly   Complete by: As directed    No wound care   Complete by: As directed       Allergies as of 10/12/2020   Not on File      Medication List     STOP taking these medications    Macular Health Formula Caps       TAKE these medications    acetaminophen 325 MG tablet Commonly known  as: TYLENOL Take 1-2 tablets (325-650 mg total) by mouth every 6 (six) hours as needed for mild pain (pain score 1-3 or temp > 100.5).   CALCIUM-VITAMIN D3 PO Take 1  tablet by mouth in the morning and at bedtime.   carbidopa-levodopa 25-100 MG tablet Commonly known as: SINEMET IR Take 1 tablet by mouth 4 (four) times daily.   carbidopa-levodopa 50-200 MG tablet Commonly known as: SINEMET CR Take 1 tablet by mouth at bedtime.   carvedilol 3.125 MG tablet Commonly known as: COREG Take 1 tablet (3.125 mg total) by mouth 2 (two) times daily with a meal.   enoxaparin 30 MG/0.3ML injection Commonly known as: LOVENOX Inject 0.3 mLs (30 mg total) into the skin daily for 21 days.   HYDROcodone-acetaminophen 5-325 MG tablet Commonly known as: NORCO/VICODIN Take 1-2 tablets by mouth every 6 (six) hours as needed for moderate pain or severe pain (pain score 4-6).   polyethylene glycol 17 g packet Commonly known as: MiraLax Take 17 g by mouth daily.   senna 8.6 MG Tabs tablet Commonly known as: SENOKOT Take 1 tablet (8.6 mg total) by mouth daily.       Not on File    The results of significant diagnostics from this hospitalization (including imaging, microbiology, ancillary and laboratory) are listed below for reference.    Significant Diagnostic Studies: CT HEAD WO CONTRAST  Result Date: 10/08/2020 CLINICAL DATA:  Head trauma from a fall. EXAM: CT HEAD WITHOUT CONTRAST TECHNIQUE: Contiguous axial images were obtained from the base of the skull through the vertex without intravenous contrast. COMPARISON:  None. FINDINGS: Brain: No evidence of acute infarction, hemorrhage, hydrocephalus, extra-axial collection or mass lesion/mass effect. Mild cerebral atrophy. Patchy low-attenuation changes in the deep white matter consistent with small vessel ischemic change. Vascular: No hyperdense vessel or unexpected calcification. Skull: Calvarium appears intact. Sinuses/Orbits: Paranasal sinuses and mastoid air cells are clear. Other: None. IMPRESSION: No acute intracranial abnormalities. Chronic atrophy and small vessel ischemic changes. Electronically Signed    By: Burman NievesWilliam  Stevens M.D.   On: 10/08/2020 20:30   CT PELVIS WO CONTRAST  Result Date: 10/08/2020 CLINICAL DATA:  Status post fall.  Right acetabular fracture. EXAM: CT PELVIS WITHOUT CONTRAST TECHNIQUE: Multidetector CT imaging of the pelvis was performed following the standard protocol without intravenous contrast. COMPARISON:  Hip films from earlier the same day FINDINGS: Urinary Tract:  Bladder is moderately distended. Bowel:  Unremarkable. Vascular/Lymphatic: Atherosclerotic calcification noted lower abdominal aorta and iliac arteries. Reproductive:  Prostate gland is enlarged. Other:  None. Musculoskeletal: Severely comminuted right acetabular fracture identified with fracture fragments distracted by at least 15 mm. Fracture involves the roof of the acetabulum, medial acetabulum and posterior lip of the acetabulum. Fracture extends cranially up into the iliac wing and anteriorly to involve the right superior pubic ramus. Associated comminuted fracture noted inferior right pubic ramus. Soft tissue windows demonstrate extensive hemorrhage in the extraperitoneal pelvic sidewall extending into the presacral space and right groin region. IMPRESSION: Severely comminuted right acetabular fracture involving the roof of the acetabulum, medial acetabulum and posterior lip. Fracture extends anteriorly into the superior pubic ramus. Associated comminuted fracture noted right inferior pubic ramus. Extensive hemorrhage in the soft tissues adjacent to the right hip, involving the extraperitoneal right pelvic sidewall. Electronically Signed   By: Kennith CenterEric  Mansell M.D.   On: 10/08/2020 18:30   DG Pelvis Comp Min 3V  Result Date: 10/09/2020 CLINICAL DATA:  Status post open reduction and internal fixation of the pelvis.  Obtained in PACU. EXAM: JUDET PELVIS - 3+ VIEW COMPARISON:  CT pelvis 10/08/2020 FINDINGS: Status post plate and screw fixation of a right acetabular fracture. No new acute displaced fracture identified. No  pelvic diastasis. No dislocation of bilateral hips. No severe arthropathy or aggressive appearing focal bone lesion. Vascular calcifications. IMPRESSION: Status post plate and screw fixation of a right acetabular fracture. Electronically Signed   By: Tish Frederickson M.D.   On: 10/09/2020 16:04   DG Pelvis Comp Min 3V  Result Date: 10/09/2020 CLINICAL DATA:  Open reduction and internal fixation of right acetabular fracture. EXAM: DG C-ARM 1-60 MIN; JUDET PELVIS - 3+ VIEW CONTRAST:  None. FLUOROSCOPY TIME:  Radiation Exposure Index (if provided by the fluoroscopic device): 5.8 mGy. Number of Acquired Spot Images: 8. COMPARISON:  None. FINDINGS: Eight intraoperative fluoroscopic images were obtained of the right pelvis. These images demonstrate surgical reduction and internal fixation of right acetabular fracture. Improved alignment of fracture components is noted. IMPRESSION: Fluoroscopic guidance provided during open reduction and internal fixation of right acetabular fracture. Electronically Signed   By: Lupita Raider M.D.   On: 10/09/2020 15:27   DG C-Arm 1-60 Min  Result Date: 10/09/2020 CLINICAL DATA:  Open reduction and internal fixation of right acetabular fracture. EXAM: DG C-ARM 1-60 MIN; JUDET PELVIS - 3+ VIEW CONTRAST:  None. FLUOROSCOPY TIME:  Radiation Exposure Index (if provided by the fluoroscopic device): 5.8 mGy. Number of Acquired Spot Images: 8. COMPARISON:  None. FINDINGS: Eight intraoperative fluoroscopic images were obtained of the right pelvis. These images demonstrate surgical reduction and internal fixation of right acetabular fracture. Improved alignment of fracture components is noted. IMPRESSION: Fluoroscopic guidance provided during open reduction and internal fixation of right acetabular fracture. Electronically Signed   By: Lupita Raider M.D.   On: 10/09/2020 15:27   DG Hip Unilat With Pelvis 2-3 Views Right  Result Date: 10/08/2020 CLINICAL DATA:  RIGHT hip pain status post  fall EXAM: DG HIP (WITH OR WITHOUT PELVIS) 2-3V RIGHT COMPARISON:  None. FINDINGS: There is protrusion of the RIGHT femoral head through the medial border of the acetabulum. There is discontinuity of the iliopectineal line on the RIGHT. No RIGHT femoral neck fracture. IMPRESSION: Fracture through the medial wall of the RIGHT acetabulum with protrusion of the femoral head. Electronically Signed   By: Genevive Bi M.D.   On: 10/08/2020 17:17    Microbiology: Recent Results (from the past 240 hour(s))  Resp Panel by RT-PCR (Flu A&B, Covid) Nasopharyngeal Swab     Status: None   Collection Time: 10/08/20  6:42 PM   Specimen: Nasopharyngeal Swab; Nasopharyngeal(NP) swabs in vial transport medium  Result Value Ref Range Status   SARS Coronavirus 2 by RT PCR NEGATIVE NEGATIVE Final    Comment: (NOTE) SARS-CoV-2 target nucleic acids are NOT DETECTED.  The SARS-CoV-2 RNA is generally detectable in upper respiratory specimens during the acute phase of infection. The lowest concentration of SARS-CoV-2 viral copies this assay can detect is 138 copies/mL. A negative result does not preclude SARS-Cov-2 infection and should not be used as the sole basis for treatment or other patient management decisions. A negative result may occur with  improper specimen collection/handling, submission of specimen other than nasopharyngeal swab, presence of viral mutation(s) within the areas targeted by this assay, and inadequate number of viral copies(<138 copies/mL). A negative result must be combined with clinical observations, patient history, and epidemiological information. The expected result is Negative.  Fact Sheet for Patients:  BloggerCourse.com  Fact Sheet for Healthcare Providers:  SeriousBroker.it  This test is no t yet approved or cleared by the [place] FDA and  has been authorized for detection and/or diagnosis of SARS-CoV-2 by FDA under  an Emergency Use Authorization (EUA). This EUA will remain  in effect (meaning this test can be used) for the duration of the COVID-19 declaration under Section 564(b)(1) of the Act, 21 U.S.C.section 360bbb-3(b)(1), unless the authorization is terminated  or revoked sooner.       Influenza A by PCR NEGATIVE NEGATIVE Final   Influenza B by PCR NEGATIVE NEGATIVE Final    Comment: (NOTE) The Xpert Xpress SARS-CoV-2/FLU/RSV plus assay is intended as an aid in the diagnosis of influenza from Nasopharyngeal swab specimens and should not be used as a sole basis for treatment. Nasal washings and aspirates are unacceptable for Xpert Xpress SARS-CoV-2/FLU/RSV testing.  Fact Sheet for Patients: BloggerCourse.com  Fact Sheet for Healthcare Providers: SeriousBroker.it  This test is not yet approved or cleared by the [place] FDA and has been authorized for detection and/or diagnosis of SARS-CoV-2 by FDA under an Emergency Use Authorization (EUA). This EUA will remain in effect (meaning this test can be used) for the duration of the COVID-19 declaration under Section 564(b)(1) of the Act, 21 U.S.C. section 360bbb-3(b)(1), unless the authorization is terminated or revoked.  Performed at [facility], [address] [place], [place] [address]   Surgical pcr screen     Status: None   Collection Time: 10/09/20  4:06 AM   Specimen: Nasal Mucosa; Nasal Swab  Result Value Ref Range Status   MRSA, PCR NEGATIVE NEGATIVE Final   Staphylococcus aureus NEGATIVE NEGATIVE Final    Comment: (NOTE) The Xpert SA Assay (FDA approved for NASAL specimens in patients 69 years of age and older), is one component of a comprehensive surveillance program. It is not intended to diagnose infection nor to guide or monitor treatment. Performed at [facility], [address], [place], [place] [address]   SARS CORONAVIRUS 2 ([name] 6-24 HRS)      Status: None   Collection Time: 10/11/20 12:00 PM  Result Value Ref Range Status   SARS Coronavirus 2 NEGATIVE NEGATIVE Final    Comment: (NOTE) SARS-CoV-2 target nucleic acids are NOT DETECTED.  The SARS-CoV-2 RNA is generally detectable in upper and lower respiratory specimens during the acute phase of infection. Negative results do not preclude SARS-CoV-2 infection, do not rule out co-infections with other pathogens, and should not be used as the sole basis for treatment or other patient management decisions. Negative results must be combined with clinical observations, patient history, and epidemiological information. The expected result is Negative.  Fact Sheet for Patients: HairSlick.no  Fact Sheet for Healthcare Providers: quierodirigir.com  This test is not yet approved or cleared by the [place] FDA and  has been authorized for detection and/or diagnosis of SARS-CoV-2 by FDA under an Emergency Use Authorization (EUA). This EUA will remain  in effect (meaning this test can be used) for the duration of the COVID-19 declaration under Se ction 564(b)(1) of the Act, 21 U.S.C. section 360bbb-3(b)(1), unless the authorization is terminated or revoked sooner.  Performed at [facility], [address], [place], [place] [address]      Labs: Basic Metabolic Panel: Recent Labs  Lab 10/08/20 1722 10/09/20 0546 10/09/20 0937 10/10/20 0607 10/11/20 0037 10/12/20 0146  NA 134*  --  132* 131* 129* 132*  K  4.5  --  4.5 4.2 4.5 4.1  CL 100  --  96* 97* 96* 97*  CO2 26  --  28 28 29 28   GLUCOSE 139*  --  124* 134* 114* 122*  BUN 27*  --  22 23 26* 24*  CREATININE 1.17  --  1.08 1.20 1.14 1.14  CALCIUM 8.8*  --  9.2 8.7* 8.6* 8.4*  MG  --   --   --  2.1  --   --   PHOS  --  3.4  --   --   --  3.5   Liver Function Tests: Recent Labs  Lab 10/09/20 0937 10/11/20 0037 10/12/20 0146  AST 22 34  --   ALT 7  <5  --   ALKPHOS 31* 24*  --   BILITOT 1.7* 1.1  --   PROT 5.9* 4.6*  --   ALBUMIN 3.4* 2.6* 2.6*   No results for input(s): LIPASE, AMYLASE in the last 168 hours. No results for input(s): AMMONIA in the last 168 hours. CBC: Recent Labs  Lab 10/08/20 1722 10/09/20 0211 10/09/20 0546 10/09/20 1845 10/10/20 0021 10/10/20 0607 10/11/20 0037 10/12/20 0146  WBC 11.7*  --  11.6*  --   --  13.8* 10.1 8.5  NEUTROABS 10.2*  --   --   --   --   --   --  6.4  HGB 13.2   < > 11.8* 10.3* 9.0* 8.7* 7.4* 7.8*  HCT 39.5   < > 34.8* 30.0* 26.6* 25.3* 21.8* 22.8*  MCV 97.8  --  95.6  --   --  96.2 96.9 96.6  PLT 160  --  130*  --   --  93* 90* 116*   < > = values in this interval not displayed.   Cardiac Enzymes: No results for input(s): CKTOTAL, CKMB, CKMBINDEX, TROPONINI in the last 168 hours. BNP: BNP (last 3 results) No results for input(s): BNP in the last 8760 hours.  ProBNP (last 3 results) No results for input(s): PROBNP in the last 8760 hours.  CBG: No results for input(s): GLUCAP in the last 168 hours.     Signed:  12/13/20 MD   [facility] 10/12/2020, 8:48 AM

## 2020-10-12 NOTE — Discharge Instructions (Addendum)
[facility] Discharge Instructions   General Discharge Instructions  Orthopaedic Injuries:  Right acetabulum fracture treated with open reduction internal fixation using plate and screws  WEIGHT BEARING STATUS: Touchdown weightbearing right leg.  Use walker to mobilize.  Okay to use wheelchair as well  RANGE OF MOTION/ACTIVITY: Posterior hip precautions right hip for 12 weeks.  Activity as tolerated while maintaining weightbearing restrictions  Bone health: Vitamin D levels look okay however fracture mechanism is suggestive of osteoporosis.  Would recommend bone density scan in the next 8 weeks.  Would recommend over-the-counter vitamin D3 5000 IUs daily  Wound Care: Daily wound care starting on 10/15/2020.  Please see instructions below.  Can leave wounds open to the air once incision is dry.  Clean with soap and water only  Discharge Wound Care Instructions  Do NOT apply any ointments, solutions or lotions to pin sites or surgical wounds.  These prevent needed drainage and even though solutions like hydrogen peroxide kill bacteria, they also damage cells lining the pin sites that help fight infection.  Applying lotions or ointments can keep the wounds moist and can cause them to breakdown and open up as well. This can increase the risk for infection. When in doubt call the office.  Surgical incisions should be dressed daily.  If any drainage is noted, use one layer of adaptic, then 4 x 4 gauze and tape.  Alternatively you can use a Mepilex type dressing.  Once the incision is completely dry and without drainage, it may be left open to air out.  Showering may begin 36-48 hours later.  Cleaning gently with soap and water.  DVT/PE prophylaxis: Lovenox one subcutaneous injection daily for 21 days  Diet: as you were eating previously.  Can use over the counter stool softeners and bowel preparations, such as Miralax, to help with bowel movements.  Narcotics can be  constipating.  Be sure to drink plenty of fluids  PAIN MEDICATION USE AND EXPECTATIONS  You have likely been given narcotic medications to help control your pain.  After a traumatic event that results in an fracture (broken bone) with or without surgery, it is ok to use narcotic pain medications to help control one's pain.  We understand that everyone responds to pain differently and each individual patient will be evaluated on a regular basis for the continued need for narcotic medications. Ideally, narcotic medication use should last no more than 6-8 weeks (coinciding with fracture healing).   As a patient it is your responsibility as well to monitor narcotic medication use and report the amount and frequency you use these medications when you come to your office visit.   We would also advise that if you are using narcotic medications, you should take a dose prior to therapy to maximize you participation.  IF YOU ARE ON NARCOTIC MEDICATIONS IT IS NOT PERMISSIBLE TO OPERATE A MOTOR VEHICLE (MOTORCYCLE/CAR/TRUCK/MOPED) OR HEAVY MACHINERY DO NOT MIX NARCOTICS WITH OTHER CNS (CENTRAL NERVOUS SYSTEM) DEPRESSANTS SUCH AS ALCOHOL   POST-OPERATIVE OPIOID TAPER INSTRUCTIONS: It is important to wean off of your opioid medication as soon as possible. If you do not need pain medication after your surgery it is ok to stop day one. Opioids include: Codeine, Hydrocodone(Norco, Vicodin), Oxycodone(Percocet, oxycontin) and hydromorphone amongst others.  Long term and even short term use of opiods can cause: Increased pain response Dependence Constipation Depression Respiratory depression And more.  Withdrawal symptoms can include Flu like symptoms Nausea, vomiting And more Techniques to manage  these symptoms Hydrate well Eat regular healthy meals Stay active Use relaxation techniques(deep breathing, meditating, yoga) Do Not substitute Alcohol to help with tapering If you have been on opioids for  less than two weeks and do not have pain than it is ok to stop all together.  Plan to wean off of opioids This plan should start within one week post op of your fracture surgery  Maintain the same interval or time between taking each dose and first decrease the dose.  Cut the total daily intake of opioids by one tablet each day Next start to increase the time between doses. The last dose that should be eliminated is the evening dose.    STOP SMOKING OR USING NICOTINE PRODUCTS!!!!  As discussed nicotine severely impairs your body's ability to heal surgical and traumatic wounds but also impairs bone healing.  Wounds and bone heal by forming microscopic blood vessels (angiogenesis) and nicotine is a vasoconstrictor (essentially, shrinks blood vessels).  Therefore, if vasoconstriction occurs to these microscopic blood vessels they essentially disappear and are unable to deliver necessary nutrients to the healing tissue.  This is one modifiable factor that you can do to dramatically increase your chances of healing your injury.    (This means no smoking, no nicotine gum, patches, etc)  DO NOT USE NONSTEROIDAL ANTI-INFLAMMATORY DRUGS (NSAID'S)  Using products such as Advil (ibuprofen), Aleve (naproxen), Motrin (ibuprofen) for additional pain control during fracture healing can delay and/or prevent the healing response.  If you would like to take over the counter (OTC) medication, Tylenol (acetaminophen) is ok.  However, some narcotic medications that are given for pain control contain acetaminophen as well. Therefore, you should not exceed more than 4000 mg of tylenol in a day if you do not have liver disease.  Also note that there are may OTC medicines, such as cold medicines and allergy medicines that my contain tylenol as well.  If you have any questions about medications and/or interactions please ask your doctor/PA or your pharmacist.      ICE AND ELEVATE INJURED/OPERATIVE EXTREMITY  Using ice and  elevating the injured extremity above your heart can help with swelling and pain control.  Icing in a pulsatile fashion, such as 20 minutes on and 20 minutes off, can be followed.    Do not place ice directly on skin. Make sure there is a barrier between to skin and the ice pack.    Using frozen items such as frozen peas works well as the conform nicely to the are that needs to be iced.  USE AN ACE WRAP OR TED HOSE FOR SWELLING CONTROL  In addition to icing and elevation, Ace wraps or TED hose are used to help limit and resolve swelling.  It is recommended to use Ace wraps or TED hose until you are informed to stop.    When using Ace Wraps start the wrapping distally (farthest away from the body) and wrap proximally (closer to the body)   Example: If you had surgery on your leg or thing and you do not have a splint on, start the ace wrap at the toes and work your way up to the thigh        If you had surgery on your upper extremity and do not have a splint on, start the ace wrap at your fingers and work your way up to the upper arm  IF YOU ARE IN A SPLINT OR CAST DO NOT REMOVE IT FOR ANY REASON  If your splint gets wet for any reason please contact the office immediately. You may shower in your splint or cast as long as you keep it dry.  This can be done by wrapping in a cast cover or garbage back (or similar)  Do Not stick any thing down your splint or cast such as pencils, money, or hangers to try and scratch yourself with.  If you feel itchy take benadryl as prescribed on the bottle for itching  IF YOU ARE IN A CAM BOOT (BLACK BOOT)  You may remove boot periodically. Perform daily dressing changes as noted below.  Wash the liner of the boot regularly and wear a sock when wearing the boot. It is recommended that you sleep in the boot until told otherwise    Call office for the following: Temperature greater than 101F Persistent nausea and vomiting Severe uncontrolled pain Redness,  tenderness, or signs of infection (pain, swelling, redness, odor or green/yellow discharge around the site) Difficulty breathing, headache or visual disturbances Hives Persistent dizziness or light-headedness Extreme fatigue Any other questions or concerns you may have after discharge  In an emergency, call 911 or go to an Emergency Department at a nearby hospital  HELPFUL INFORMATION  If you had a block, it will wear off between 8-24 hrs postop typically.  This is period when your pain may go from nearly zero to the pain you would have had postop without the block.  This is an abrupt transition but nothing dangerous is happening.  You may take an extra dose of narcotic when this happens.  You should wean off your narcotic medicines as soon as you are able.  Most patients will be off or using minimal narcotics before their first postop appointment.   We suggest you use the pain medication the first night prior to going to bed, in order to ease any pain when the anesthesia wears off. You should avoid taking pain medications on an empty stomach as it will make you nauseous.  Do not drink alcoholic beverages or take illicit drugs when taking pain medications.  In most states it is against the law to drive while you are in a splint or sling.  And certainly against the law to drive while taking narcotics.  You may return to work/school in the next couple of days when you feel up to it.   Pain medication may make you constipated.  Below are a few solutions to try in this order: Decrease the amount of pain medication if you aren't having pain. Drink lots of decaffeinated fluids. Drink prune juice and/or each dried prunes  If the first 3 don't work start with additional solutions Take Colace - an over-the-counter stool softener Take Senokot - an over-the-counter laxative Take Miralax - a stronger over-the-counter laxative     CALL THE OFFICE WITH ANY QUESTIONS OR CONCERNS: [phone]    VISIT OUR WEBSITE FOR ADDITIONAL INFORMATION: [email or web address]

## 2020-10-12 NOTE — Progress Notes (Signed)
Orthopaedic Trauma Service Progress Note  Patient ID: Greg [name] MRN: [id number] DOB/AGE: 85/12/1931 85 y.o.  Subjective:  Doing very well No pain at rest Mild discomfort in lower abd/suprapubic region when transferring  Discharging today   Cbc stable  ROS As above  Objective:   VITALS:   Vitals:   10/12/20 0351 10/12/20 0410 10/12/20 0500 10/12/20 0903  BP: (!) 148/74 (!) 116/58  130/60  Pulse: 89 83  87  Resp:  18  16  Temp:  99.5 F (37.5 C)  98.6 F (37 C)  TempSrc:  Oral  Oral  SpO2: 96% 93%  94%  Weight:   87.1 kg     Estimated body mass index is 28.36 kg/m as calculated from the following:   Height as of 09/26/20: 5\' 9"  (1.753 m).   Weight as of this encounter: 87.1 kg.   Intake/Output      07/07 0701 07/08 0700 07/08 0701 07/09 0700   P.O. 610    Total Intake(mL/kg) 610 (7)    Urine (mL/kg/hr) 750 (0.4) 700 (1.3)   Stool 0    Total Output 750 700   Net -140 -700        Stool Occurrence 1 x      LABS  Results for orders placed or performed during the hospital encounter of 10/08/20 (from the past 24 hour(s))  Renal function panel     Status: Abnormal   Collection Time: 10/12/20  1:46 AM  Result Value Ref Range   Sodium 132 (L) 135 - 145 mmol/L   Potassium 4.1 3.5 - 5.1 mmol/L   Chloride 97 (L) 98 - 111 mmol/L   CO2 28 22 - 32 mmol/L   Glucose, Bld 122 (H) 70 - 99 mg/dL   BUN 24 (H) 8 - 23 mg/dL   Creatinine, Ser 12/13/20 0.61 - 1.24 mg/dL   Calcium 8.4 (L) 8.9 - 10.3 mg/dL   Phosphorus 3.5 2.5 - 4.6 mg/dL   Albumin 2.6 (L) 3.5 - 5.0 g/dL   GFR, Estimated 1.02 >72 mL/min   Anion gap 7 5 - 15  CBC with Differential/Platelet     Status: Abnormal   Collection Time: 10/12/20  1:46 AM  Result Value Ref Range   WBC 8.5 4.0 - 10.5 K/uL   RBC 2.36 (L) 4.22 - 5.81 MIL/uL   Hemoglobin 7.8 (L) 13.0 - 17.0 g/dL   HCT 12/13/20 (L) 66.4 - 40.3 %   MCV 96.6 80.0 - 100.0 fL   MCH  33.1 26.0 - 34.0 pg   MCHC 34.2 30.0 - 36.0 g/dL   RDW 47.4 25.9 - 56.3 %   Platelets 116 (L) 150 - 400 K/uL   nRBC 0.0 0.0 - 0.2 %   Neutrophils Relative % 74 %   Neutro Abs 6.4 1.7 - 7.7 K/uL   Lymphocytes Relative 11 %   Lymphs Abs 0.9 0.7 - 4.0 K/uL   Monocytes Relative 13 %   Monocytes Absolute 1.1 (H) 0.1 - 1.0 K/uL   Eosinophils Relative 1 %   Eosinophils Absolute 0.1 0.0 - 0.5 K/uL   Basophils Relative 0 %   Basophils Absolute 0.0 0.0 - 0.1 K/uL   Immature Granulocytes 1 %   Abs Immature Granulocytes 0.07 0.00 - 0.07 K/uL     PHYSICAL EXAM:  Gen: awake and alert, NAD, pleasant, wife at bedside  Lungs: unlabored Abd: NTND Pelvis: dressing pfannenstiel dressing c/d/I   Dressings removed   All wounds look great    No signs of infection    No active drainage              Motor and sensory functions intact B             Obturator sensation intact B             + DP pulses             No other acute findings noted  Assessment/Plan: 3 Days Post-Op   Active Problems:   Closed right hip fracture (HCC)   Closed displaced fracture of right acetabulum (HCC)   Anti-infectives (From admission, onward)    Start     Dose/Rate Route Frequency Ordered Stop   10/09/20 1800  ceFAZolin (ANCEF) IVPB 2g/100 mL premix        2 g 200 mL/hr over 30 Minutes Intravenous Every 6 hours 10/09/20 1605 10/10/20 0618   10/09/20 1100  ceFAZolin (ANCEF) IVPB 2g/100 mL premix        2 g 200 mL/hr over 30 Minutes Intravenous On call to O.R. 10/09/20 1046 10/09/20 1156   10/09/20 1056  ceFAZolin (ANCEF) 2-4 GM/100ML-% IVPB       Note to Pharmacy: Clovis Cao   : cabinet override      10/09/20 1056 10/09/20 1925     .  POD/HD#: 34    87 year old male comminuted right acetabulum fracture following ground-level fall   -Ground-level fall   -Fragility/insufficiency fracture right acetabulum (both column + PW) s/p ORIF               TDWB R leg x 8 weeks               Posterior hip  precautions x 12 weeks               PT/OT               Ice PRN                Dressing changed today    Can change again on Monday    Leave open to air once dry    Clean with soap and water only     - Pain management:               Multimodal                               - ABL anemia/Hemodynamics               stable    - Medical issues                Per primary    - DVT/PE prophylaxis:               Lovenox x 4 weeks   - ID:                Periop abx completed    - Metabolic Bone Disease:               vitamin d levels look ok                   - Activity:  therapy evals    - FEN/GI prophylaxis/Foley/Lines:               reg diet    - Impediments to fracture healing:               Osteoporosis  - Dispo:             dc today   Follow up with ortho in 2 weeks   Mearl Latin, PA-C [phone] (C) 10/12/2020, 1:08 PM  [facility] [address] [place] [address] [phone] [place]909 [phone] (F)    After 5pm and on the weekends please log on to Amion, go to orthopaedics and the look under the [facility] Call for the provider(s) on call. You can also call our office at [phone] and then follow the prompts to be connected to the call team.

## 2020-10-15 ENCOUNTER — Non-Acute Institutional Stay (SKILLED_NURSING_FACILITY): Payer: Medicare Other | Admitting: Nurse Practitioner

## 2020-10-15 ENCOUNTER — Encounter: Payer: Self-pay | Admitting: Nurse Practitioner

## 2020-10-15 DIAGNOSIS — D500 Iron deficiency anemia secondary to blood loss (chronic): Secondary | ICD-10-CM

## 2020-10-15 DIAGNOSIS — K5901 Slow transit constipation: Secondary | ICD-10-CM

## 2020-10-15 DIAGNOSIS — R7303 Prediabetes: Secondary | ICD-10-CM | POA: Insufficient documentation

## 2020-10-15 DIAGNOSIS — M8949 Other hypertrophic osteoarthropathy, multiple sites: Secondary | ICD-10-CM

## 2020-10-15 DIAGNOSIS — S32411D Displaced fracture of anterior wall of right acetabulum, subsequent encounter for fracture with routine healing: Secondary | ICD-10-CM

## 2020-10-15 DIAGNOSIS — E871 Hypo-osmolality and hyponatremia: Secondary | ICD-10-CM | POA: Diagnosis not present

## 2020-10-15 DIAGNOSIS — R350 Frequency of micturition: Secondary | ICD-10-CM

## 2020-10-15 DIAGNOSIS — M159 Polyosteoarthritis, unspecified: Secondary | ICD-10-CM

## 2020-10-15 DIAGNOSIS — E785 Hyperlipidemia, unspecified: Secondary | ICD-10-CM

## 2020-10-15 DIAGNOSIS — N401 Enlarged prostate with lower urinary tract symptoms: Secondary | ICD-10-CM

## 2020-10-15 DIAGNOSIS — G20 Parkinson's disease: Secondary | ICD-10-CM

## 2020-10-15 DIAGNOSIS — I10 Essential (primary) hypertension: Secondary | ICD-10-CM | POA: Insufficient documentation

## 2020-10-15 DIAGNOSIS — M810 Age-related osteoporosis without current pathological fracture: Secondary | ICD-10-CM

## 2020-10-15 NOTE — Progress Notes (Signed)
Location:   SNF [place] Room Number: [place] of Service:  SNF (31) Provider: Lennie Odor Kalana Yust NP  Virgie Dad, MD  Patient Care Team: Virgie Dad, MD as PCP - General (Internal Medicine) [name], Eustace Quail, DO as Consulting Physician (Neurology)  Extended Emergency Contact Information Primary Emergency Contact: trigger, frasier Mobile Phone: [phone] Relation: Spouse Secondary Emergency Contact: ashford, clouse Mobile Phone: [phone] Relation: Son  Code Status:  DNR Goals of care: Advanced Directive information Advanced Directives 10/15/2020  Does Patient Have a Medical Advance Directive? No  Type of Advance Directive -  Would patient like information on creating a medical advance directive? No - Patient declined     Chief Complaint  Patient presents with   Acute Visit    Patient presents for a review of medications.      HPI:  Pt is a 87 y.o. male seen today for an acute visit for medication review following hospital stay.   Hospitalized 10/08/20-10/12/20 right hip fx(right acetabular fracture),  s/p ORIF, Lovenox x 21 days, TDWB x 8 weeks, posterior hip precautions 12 weeks. Tylenol, Norco  Hyponatremia, 132 upon dc  Prediabetes, diet control, Hgb a1c 5.9  Parkinson's disease, diagnosed 2019, f/u Neurology, taking Sinemet.   Urinary frequency, chronic  OA multiple sites, ambulates with walker prior to the right hip fx.   OP, took Fosamax x 6 days, stopped 2018, on Ca, Vit D  Hyperlipidemia, not taking statin  HTN, on Carvedilol  Constipation, takes MiraLax, Senna.   Anemia, started on Iron, Hgb 7.8 10/12/20   Past Medical History:  Diagnosis Date   Acute bronchiolitis    Anemia    Bursitis of both hips    Hearing reduced    hearing aid   History of basal cell carcinoma    Hyperlipidemia    Malaise and fatigue    Osteoarthritis    hand   Osteoporosis    Palpitation    Vitamin D deficiency    Past Surgical History:  Procedure Laterality  Date   CATARACT EXTRACTION  05/31/2015   Eye/right   COLONOSCOPY  02/05/2005, 03/06/2005   normal, 10 years ago   MOHS micrographic surgery     Face surgery   ORIF ACETABULAR FRACTURE Right 10/09/2020   Procedure: OPEN REDUCTION INTERNAL FIXATION (ORIF) ACETABULAR FRACTURE;  Surgeon: Altamese Keeler Farm, MD;  Location: [facility];  Service: Orthopedics;  Laterality: Right;    No Known Allergies  Allergies as of 10/15/2020   No Known Allergies      Medication List        Accurate as of October 15, 2020 11:59 PM. If you have any questions, ask your nurse or doctor.          acetaminophen 325 MG tablet Commonly known as: TYLENOL Take 1-2 tablets (325-650 mg total) by mouth every 6 (six) hours as needed for mild pain (pain score 1-3 or temp > 100.5).   CALCIUM-VITAMIN D3 PO Take 1 tablet by mouth in the morning and at bedtime.   carbidopa-levodopa 25-100 MG tablet Commonly known as: SINEMET IR Take 1 tablet by mouth 4 (four) times daily.   carbidopa-levodopa 50-200 MG tablet Commonly known as: SINEMET CR Take 1 tablet by mouth at bedtime.   carvedilol 3.125 MG tablet Commonly known as: COREG Take 1 tablet (3.125 mg total) by mouth 2 (two) times daily with a meal.   enoxaparin 30 MG/0.3ML injection Commonly known as: LOVENOX Inject 0.3 mLs (30 mg total) into the  skin daily for 21 days.   HYDROcodone-acetaminophen 5-325 MG tablet Commonly known as: NORCO/VICODIN Take 1-2 tablets by mouth every 6 (six) hours as needed for moderate pain or severe pain (pain score 4-6).   polyethylene glycol 17 g packet Commonly known as: MiraLax Take 17 g by mouth daily.   senna 8.6 MG Tabs tablet Commonly known as: SENOKOT Take 1 tablet (8.6 mg total) by mouth daily.   zinc oxide 20 % ointment Apply 1 application topically as needed for irritation.        Review of Systems  Constitutional:  Negative for activity change, appetite change and fever.  HENT:  Negative for congestion,  trouble swallowing and voice change.   Eyes:  Negative for visual disturbance.  Respiratory:  Negative for cough, choking and shortness of breath.   Cardiovascular:  Positive for leg swelling. Negative for chest pain and palpitations.  Gastrointestinal:  Negative for abdominal pain, constipation, diarrhea, nausea and vomiting.  Genitourinary:  Negative for difficulty urinating and urgency.       1-2x/night nocturnal urination.   Musculoskeletal:  Positive for arthralgias and gait problem.       Right hip pain.   Skin:  Negative for color change and pallor.  Neurological:  Positive for tremors. Negative for speech difficulty, weakness and headaches.  Psychiatric/Behavioral:  Negative for confusion and sleep disturbance. The patient is not nervous/anxious.    Immunization History  Administered Date(s) Administered   H1N1 06/13/2008   Influenza Split 03/05/2006, 02/26/2007, 01/26/2008, 01/18/2009, 12/12/2009, 01/16/2011, 01/29/2012, 02/01/2013, 02/08/2014, 02/13/2015, 01/10/2016, 02/23/2017, 02/24/2018, 01/24/2019, 12/22/2019   PFIZER([place])SARS-COV-2 Vaccination 09/01/2020   Pneumococcal Conjugate-13 02/10/2014   Pneumococcal Polysaccharide-23 06/10/2011   Zoster, Live 07/04/2009   Pertinent  Health Maintenance Due  Topic Date Due   INFLUENZA VACCINE  11/05/2020   PNA vac Low Risk Adult  Completed   Fall Risk  09/26/2020  Falls in the past year? 0  Number falls in past yr: 0  Injury with Fall? 0   Functional Status Survey:    Vitals:   10/15/20 1211  BP: 120/73  Pulse: 77  Resp: 18  Temp: 98.7 F (37.1 C)  SpO2: 94%  Weight: 191 lb 3.2 oz (86.7 kg)  Height: _0  (1.753 m)   Body mass index is 28.24 kg/m. Physical Exam Vitals reviewed.  Constitutional:      General: He is not in acute distress.    Appearance: Normal appearance. He is not ill-appearing, toxic-appearing or diaphoretic.  HENT:     Head: Normocephalic and atraumatic.     Nose: Nose normal.      Mouth/Throat:     Mouth: Mucous membranes are moist.  Eyes:     Extraocular Movements: Extraocular movements intact.     Conjunctiva/sclera: Conjunctivae normal.     Pupils: Pupils are equal, round, and reactive to light.  Cardiovascular:     Rate and Rhythm: Normal rate and regular rhythm.     Heart sounds: Murmur heard.  Pulmonary:     Effort: Pulmonary effort is normal.     Breath sounds: No rales.  Abdominal:     Palpations: Abdomen is soft.     Tenderness: There is no abdominal tenderness. There is no guarding or rebound.  Musculoskeletal:     Cervical back: Normal range of motion and neck supple. No rigidity.     Right lower leg: Edema present.     Left lower leg: Edema present.     Comments: Trace edema BLE  Skin:    General: Skin is warm and dry.     Comments: Right hip surgical incision intact, more swelling in the hip/thigh region.   Neurological:     General: No focal deficit present.     Mental Status: He is alert and oriented to person, place, and time. Mental status is at baseline.     Motor: No weakness.     Gait: Gait abnormal.     Comments: Mild overall stiffness   Psychiatric:        Mood and Affect: Mood normal.        Behavior: Behavior normal.        Thought Content: Thought content normal.        Judgment: Judgment normal.    Labs reviewed: Recent Labs    10/09/20 0546 10/09/20 0937 10/10/20 0607 10/11/20 0037 10/12/20 0146  NA  --    < > 131* 129* 132*  K  --    < > 4.2 4.5 4.1  CL  --    < > 97* 96* 97*  CO2  --    < > _0 GLUCOSE  --    < > 134* 114* 122*  BUN  --    < > 23 26* 24*  CREATININE  --    < > 1.20 1.14 1.14  CALCIUM  --    < > 8.7* 8.6* 8.4*  MG  --   --  2.1  --   --   PHOS 3.4  --   --   --  3.5   < > = values in this interval not displayed.   Recent Labs    10/09/20 0937 10/11/20 0037 10/12/20 0146  AST 22 34  --   ALT 7 <5  --   ALKPHOS 31* 24*  --   BILITOT 1.7* 1.1  --   PROT 5.9* 4.6*  --   ALBUMIN  3.4* 2.6* 2.6*   Recent Labs    10/08/20 1722 10/09/20 0211 10/10/20 0607 10/11/20 0037 10/12/20 0146  WBC 11.7*   < > 13.8* 10.1 8.5  NEUTROABS 10.2*  --   --   --  6.4  HGB 13.2   < > 8.7* 7.4* 7.8*  HCT 39.5   < > 25.3* 21.8* 22.8*  MCV 97.8   < > 96.2 96.9 96.6  PLT 160   < > 93* 90* 116*   < > = values in this interval not displayed.   No results found for: TSH Lab Results  Component Value Date   HGBA1C 5.9 (H) 10/08/2020   No results found for: CHOL, HDL, LDLCALC, LDLDIRECT, TRIG, CHOLHDL  Significant Diagnostic Results in last 30 days:  CT HEAD WO CONTRAST  Result Date: 10/08/2020 CLINICAL DATA:  Head trauma from a fall. EXAM: CT HEAD WITHOUT CONTRAST TECHNIQUE: Contiguous axial images were obtained from the base of the skull through the vertex without intravenous contrast. COMPARISON:  None. FINDINGS: Brain: No evidence of acute infarction, hemorrhage, hydrocephalus, extra-axial collection or mass lesion/mass effect. Mild cerebral atrophy. Patchy low-attenuation changes in the deep white matter consistent with small vessel ischemic change. Vascular: No hyperdense vessel or unexpected calcification. Skull: Calvarium appears intact. Sinuses/Orbits: Paranasal sinuses and mastoid air cells are clear. Other: None. IMPRESSION: No acute intracranial abnormalities. Chronic atrophy and small vessel ischemic changes. Electronically Signed   By: Lucienne Capers M.D.   On: 10/08/2020 20:30   CT PELVIS WO CONTRAST  Result Date: 10/08/2020  CLINICAL DATA:  Status post fall.  Right acetabular fracture. EXAM: CT PELVIS WITHOUT CONTRAST TECHNIQUE: Multidetector CT imaging of the pelvis was performed following the standard protocol without intravenous contrast. COMPARISON:  Hip films from earlier the same day FINDINGS: Urinary Tract:  Bladder is moderately distended. Bowel:  Unremarkable. Vascular/Lymphatic: Atherosclerotic calcification noted lower abdominal aorta and iliac arteries.  Reproductive:  Prostate gland is enlarged. Other:  None. Musculoskeletal: Severely comminuted right acetabular fracture identified with fracture fragments distracted by at least 15 mm. Fracture involves the roof of the acetabulum, medial acetabulum and posterior lip of the acetabulum. Fracture extends cranially up into the iliac wing and anteriorly to involve the right superior pubic ramus. Associated comminuted fracture noted inferior right pubic ramus. Soft tissue windows demonstrate extensive hemorrhage in the extraperitoneal pelvic sidewall extending into the presacral space and right groin region. IMPRESSION: Severely comminuted right acetabular fracture involving the roof of the acetabulum, medial acetabulum and posterior lip. Fracture extends anteriorly into the superior pubic ramus. Associated comminuted fracture noted right inferior pubic ramus. Extensive hemorrhage in the soft tissues adjacent to the right hip, involving the extraperitoneal right pelvic sidewall. Electronically Signed   By: Misty Stanley M.D.   On: 10/08/2020 18:30   DG Pelvis Comp Min 3V  Result Date: 10/09/2020 CLINICAL DATA:  Status post open reduction and internal fixation of the pelvis. Obtained in PACU. EXAM: JUDET PELVIS - 3+ VIEW COMPARISON:  CT pelvis 10/08/2020 FINDINGS: Status post plate and screw fixation of a right acetabular fracture. No new acute displaced fracture identified. No pelvic diastasis. No dislocation of bilateral hips. No severe arthropathy or aggressive appearing focal bone lesion. Vascular calcifications. IMPRESSION: Status post plate and screw fixation of a right acetabular fracture. Electronically Signed   By: Iven Finn M.D.   On: 10/09/2020 16:04   DG Pelvis Comp Min 3V  Result Date: 10/09/2020 CLINICAL DATA:  Open reduction and internal fixation of right acetabular fracture. EXAM: DG C-ARM 1-60 MIN; JUDET PELVIS - 3+ VIEW CONTRAST:  None. FLUOROSCOPY TIME:  Radiation Exposure Index (if provided  by the fluoroscopic device): 5.8 mGy. Number of Acquired Spot Images: 8. COMPARISON:  None. FINDINGS: Eight intraoperative fluoroscopic images were obtained of the right pelvis. These images demonstrate surgical reduction and internal fixation of right acetabular fracture. Improved alignment of fracture components is noted. IMPRESSION: Fluoroscopic guidance provided during open reduction and internal fixation of right acetabular fracture. Electronically Signed   By: Marijo Conception M.D.   On: 10/09/2020 15:27   DG C-Arm 1-60 Min  Result Date: 10/09/2020 CLINICAL DATA:  Open reduction and internal fixation of right acetabular fracture. EXAM: DG C-ARM 1-60 MIN; JUDET PELVIS - 3+ VIEW CONTRAST:  None. FLUOROSCOPY TIME:  Radiation Exposure Index (if provided by the fluoroscopic device): 5.8 mGy. Number of Acquired Spot Images: 8. COMPARISON:  None. FINDINGS: Eight intraoperative fluoroscopic images were obtained of the right pelvis. These images demonstrate surgical reduction and internal fixation of right acetabular fracture. Improved alignment of fracture components is noted. IMPRESSION: Fluoroscopic guidance provided during open reduction and internal fixation of right acetabular fracture. Electronically Signed   By: Marijo Conception M.D.   On: 10/09/2020 15:27   DG Hip Unilat With Pelvis 2-3 Views Right  Result Date: 10/08/2020 CLINICAL DATA:  RIGHT hip pain status post fall EXAM: DG HIP (WITH OR WITHOUT PELVIS) 2-3V RIGHT COMPARISON:  None. FINDINGS: There is protrusion of the RIGHT femoral head through the medial border of the acetabulum. There  is discontinuity of the iliopectineal line on the RIGHT. No RIGHT femoral neck fracture. IMPRESSION: Fracture through the medial wall of the RIGHT acetabulum with protrusion of the femoral head. Electronically Signed   By: Suzy Bouchard M.D.   On: 10/08/2020 17:17    Assessment/Plan Closed displaced fracture of right acetabulum (HCC) S/p ORIF, continue Lovenox  (vs Xarelto, consulted with Pharmacy), f/u Ortho, Tylenol and Norco for pain.   Hyponatremia 132 upon dc, update CMP/eGFR  Prediabetes Continue diet control, Hgb a1c 5.9  Parkinson's disease ([place]) diagnosed 2019, f/u Neurology, taking Sinemet.   BPH (benign prostatic hyperplasia) Urinary frequency, chronic  Osteoarthritis, multiple sites OA multiple sites, ambulates with walker prior to the right hip fx.  Osteoporosis took Fosamax x 6 days, stopped 2018, on Ca, Vit D  Hyperlipidemia Not taking statin.   HTN (hypertension) New, blood pressure is controlled, taking Carvedilol.   Slow transit constipation Stable, continue Senna, MiraLax.   Blood loss anemia Hgb 7.8 10/12/20, on Iron, may consider decrease Iron if Hgb improving.     Family/ staff Communication: plan of care reviewed with the patient and charge nurse.   Labs/tests ordered:  CBC/diff, CMP/eGFR    Time spend 35 minutes.

## 2020-10-15 NOTE — Assessment & Plan Note (Signed)
OA multiple sites, ambulates with walker prior to the right hip fx.

## 2020-10-15 NOTE — Assessment & Plan Note (Signed)
Hgb 7.8 10/12/20, on Iron, may consider decrease Iron if Hgb improving.

## 2020-10-15 NOTE — Assessment & Plan Note (Signed)
Not taking statin.  

## 2020-10-15 NOTE — Assessment & Plan Note (Signed)
S/p ORIF, continue Lovenox (vs Xarelto, consulted with Pharmacy), f/u Ortho, Tylenol and Norco for pain.

## 2020-10-15 NOTE — Assessment & Plan Note (Signed)
Stable, continue Senna, MiraLax/  

## 2020-10-15 NOTE — Assessment & Plan Note (Signed)
Urinary frequency, chronic. 

## 2020-10-15 NOTE — Assessment & Plan Note (Signed)
132 upon dc, update CMP/eGFR

## 2020-10-15 NOTE — Assessment & Plan Note (Signed)
diagnosed 2019, f/u Neurology, taking Sinemet. 

## 2020-10-15 NOTE — Assessment & Plan Note (Signed)
New, blood pressure is controlled, taking Carvedilol.

## 2020-10-15 NOTE — Assessment & Plan Note (Signed)
took Fosamax x 6 days, stopped 2018, on Ca, Vit D

## 2020-10-15 NOTE — Assessment & Plan Note (Signed)
Continue diet control, Hgb a1c 5.9

## 2020-10-16 ENCOUNTER — Encounter: Payer: Self-pay | Admitting: Nurse Practitioner

## 2020-10-18 ENCOUNTER — Non-Acute Institutional Stay (SKILLED_NURSING_FACILITY): Payer: Medicare Other | Admitting: Internal Medicine

## 2020-10-18 ENCOUNTER — Encounter: Payer: Self-pay | Admitting: Internal Medicine

## 2020-10-18 DIAGNOSIS — N401 Enlarged prostate with lower urinary tract symptoms: Secondary | ICD-10-CM | POA: Diagnosis not present

## 2020-10-18 DIAGNOSIS — I10 Essential (primary) hypertension: Secondary | ICD-10-CM | POA: Diagnosis not present

## 2020-10-18 DIAGNOSIS — S32411D Displaced fracture of anterior wall of right acetabulum, subsequent encounter for fracture with routine healing: Secondary | ICD-10-CM | POA: Diagnosis not present

## 2020-10-18 DIAGNOSIS — G20 Parkinson's disease: Secondary | ICD-10-CM | POA: Diagnosis not present

## 2020-10-18 DIAGNOSIS — R350 Frequency of micturition: Secondary | ICD-10-CM

## 2020-10-18 DIAGNOSIS — D500 Iron deficiency anemia secondary to blood loss (chronic): Secondary | ICD-10-CM

## 2020-10-18 DIAGNOSIS — M810 Age-related osteoporosis without current pathological fracture: Secondary | ICD-10-CM

## 2020-10-18 NOTE — Progress Notes (Signed)
Provider:  [name] MD Location:   [facility] Room Number: [phone] Place of Service:  SNF ([phone])  PCP: Greg Gammon, MD Patient Care Team: Greg Gammon, MD as PCP - General (Internal Medicine) Greg [name], Greg Batty, DO as Consulting Physician (Neurology)  Extended Emergency Contact Information Primary Emergency Contact: Greg [name], Greg [name] Mobile Phone: [phone] Relation: Spouse Secondary Emergency Contact: Greg [name], Greg [name] Mobile Phone: [phone] Relation: Son  Code Status: Full Code Goals of Care: Advanced Directive information Advanced Directives 10/18/2020  Does Patient Have a Medical Advance Directive? Yes  Type of Advance Directive Living will  Does patient want to make changes to medical advance directive? No - Patient declined  Would patient like information on creating a medical advance directive? -      Chief Complaint  Patient presents with   New Admit To SNF    Admission to SNF    HPI: Patient is a 87 y.o. male seen today for admission to SNF for therapy Admitted in Hospital from 7/4-7/8 for Commuted Right Acetabulum Fracture   Patient has h/o Parkinson disease Follows with Dr Greg [name] Diagnosed in 2019 in [place] Has ,Bradykinesia and Rigidity MRI of head show mild small vessel disease as well as a few old microhemorrhages in the right cerebellum and right cerebral hemisphere.  MRI of the cervical spine that same date demonstrating multilevel degenerative changes, most pronounced at C3-C4.  Also has h/o Osteoporosis , Vit D def Has been on Fosamax before, BPH, Arthritis,  Independent in his ADLS Was not uising any assist device beofer No Cognitve impairnment Does have h/o Nightmare  He fell in his apartment. Lost his balance Was found to have Right Hip Acetabulum Fracture Underwent ORIF on 07/06 by Dr Carola Frost   His complain today is mainily Hallucination visual and thinks it is due to his pain meds Also c/o Bruise in his Scrotal area No  Pain seems to be controlled   Past Medical History:  Diagnosis Date   Acute bronchiolitis    Anemia    Bursitis of both hips    Hearing reduced    hearing aid   History of basal cell carcinoma    Hyperlipidemia    Malaise and fatigue    Osteoarthritis    hand   Osteoporosis    Palpitation    Vitamin D deficiency    Past Surgical History:  Procedure Laterality Date   CATARACT EXTRACTION  05/31/2015   Eye/right   COLONOSCOPY  02/05/2005, 03/06/2005   normal, 10 years ago   MOHS micrographic surgery     Face surgery   ORIF ACETABULAR FRACTURE Right 10/09/2020   Procedure: OPEN REDUCTION INTERNAL FIXATION (ORIF) ACETABULAR FRACTURE;  Surgeon: [name], MD;  Location: [facility] OR;  Service: Orthopedics;  Laterality: Right;    reports that he has never smoked. He has never used smokeless tobacco. He reports that he does not drink alcohol and does not use drugs. Social History   Socioeconomic History   Marital status: Married    Spouse name: [name]   Number of children: 2   Years of education: Not on file   Highest education level: Not on file  Occupational History   Occupation: retired    Comment: art professor  Tobacco Use   Smoking status: Never   Smokeless tobacco: Never  Vaping Use   Vaping Use: Never used  Substance and Sexual Activity   Alcohol use: Never   Drug use: Never   Sexual activity:  Not on file  Other Topics Concern   Not on file  Social History Narrative   Pt just move from [place] to [facility]   Pt leaving with wife   Social Determinants of Health   Financial Resource Strain: Not on file  Food Insecurity: Not on file  Transportation Needs: Not on file  Physical Activity: Not on file  Stress: Not on file  Social Connections: Not on file  Intimate Partner Violence: Not on file    Functional Status Survey:    Family History  Problem Relation Age of Onset   Prostate cancer Father    Skin cancer Brother     Health Maintenance  Topic  Date Due   TETANUS/TDAP  Never done   Zoster Vaccines- Shingrix (1 of 2) Never done   COVID-19 Vaccine (2 - Pfizer series) 09/22/2020   INFLUENZA VACCINE  11/05/2020   PNA vac Low Risk Adult  Completed   HPV VACCINES  Aged Out    No Known Allergies  Allergies as of 10/18/2020   No Known Allergies      Medication List        Accurate as of October 18, 2020 10:28 AM. If you have any questions, ask your nurse or doctor.          acetaminophen 325 MG tablet Commonly known as: TYLENOL Take 1-2 tablets (325-650 mg total) by mouth every 6 (six) hours as needed for mild pain (pain score 1-3 or temp > 100.5).   CALCIUM-VITAMIN D3 PO Take 1 tablet by mouth in the morning and at bedtime.   carbidopa-levodopa 25-100 MG tablet Commonly known as: SINEMET IR Take 1 tablet by mouth 4 (four) times daily.   carbidopa-levodopa 50-200 MG tablet Commonly known as: SINEMET CR Take 1 tablet by mouth at bedtime.   carvedilol 3.125 MG tablet Commonly known as: COREG Take 1 tablet (3.125 mg total) by mouth 2 (two) times daily with a meal.   enoxaparin 30 MG/0.3ML injection Commonly known as: LOVENOX Inject 0.3 mLs (30 mg total) into the skin daily for 21 days.   HYDROcodone-acetaminophen 5-325 MG tablet Commonly known as: NORCO/VICODIN Take 1-2 tablets by mouth every 6 (six) hours as needed for moderate pain or severe pain (pain score 4-6).   polyethylene glycol 17 g packet Commonly known as: MiraLax Take 17 g by mouth daily.   senna 8.6 MG Tabs tablet Commonly known as: SENOKOT Take 1 tablet (8.6 mg total) by mouth daily.   zinc oxide 20 % ointment Apply 1 application topically as needed for irritation.        Review of Systems  Constitutional:  Positive for activity change and appetite change.  HENT: Negative.    Respiratory: Negative.    Cardiovascular: Negative.   Gastrointestinal:  Positive for constipation.  Genitourinary: Negative.   Musculoskeletal:  Positive for  arthralgias and gait problem.  Skin:  Positive for color change.  Neurological:  Negative for dizziness.  Psychiatric/Behavioral:  Positive for hallucinations.    Vitals:   10/18/20 1016  BP: 139/72  Pulse: 82  Resp: 19  Temp: 98 F (36.7 C)  SpO2: 90%  Weight: 186 lb 4.8 oz (84.5 kg)  Height: 5\' 9"  (1.753 m)   Body mass index is 27.51 kg/m. Physical Exam Vitals reviewed.  Constitutional:      Appearance: Normal appearance.  HENT:     Head: Normocephalic.     Nose: Nose normal.     Mouth/Throat:     Mouth:  Mucous membranes are moist.     Pharynx: Oropharynx is clear.  Eyes:     Pupils: Pupils are equal, round, and reactive to light.  Cardiovascular:     Rate and Rhythm: Normal rate and regular rhythm.     Pulses: Normal pulses.     Heart sounds: Normal heart sounds.  Pulmonary:     Effort: Pulmonary effort is normal.     Breath sounds: Normal breath sounds.  Abdominal:     General: Abdomen is flat. Bowel sounds are normal.     Palpations: Abdomen is soft.  Genitourinary:    Comments: Has no swelling in his Scrotal area or rash but did have Bruising Musculoskeletal:        General: No swelling.     Cervical back: Neck supple.  Skin:    General: Skin is warm.  Neurological:     General: No focal deficit present.     Mental Status: He is alert and oriented to person, place, and time.  Psychiatric:        Mood and Affect: Mood normal.        Thought Content: Thought content normal.    Labs reviewed: Basic Metabolic Panel: Recent Labs    10/09/20 0546 10/09/20 0937 10/10/20 0607 10/11/20 0037 10/12/20 0146  NA  --    < > 131* 129* 132*  K  --    < > 4.2 4.5 4.1  CL  --    < > 97* 96* 97*  CO2  --    < > GLUCOSE  --    < > 134* 114* 122*  BUN  --    < > 23 26* 24*  CREATININE  --    < > 1.20 1.14 1.14  CALCIUM  --    < > 8.7* 8.6* 8.4*  MG  --   --  2.1  --   --   PHOS 3.4  --   --   --  3.5   < > = values in this interval not displayed.    Liver Function Tests: Recent Labs    10/09/20 0937 10/11/20 0037 10/12/20 0146  AST 22 34  --   ALT 7 <5  --   ALKPHOS 31* 24*  --   BILITOT 1.7* 1.1  --   PROT 5.9* 4.6*  --   ALBUMIN 3.4* 2.6* 2.6*   No results for input(s): LIPASE, AMYLASE in the last 8760 hours. No results for input(s): AMMONIA in the last 8760 hours. CBC: Recent Labs    10/08/20 1722 10/09/20 0211 10/10/20 0607 10/11/20 0037 10/12/20 0146  WBC 11.7*   < > 13.8* 10.1 8.5  NEUTROABS 10.2*  --   --   --  6.4  HGB 13.2   < > 8.7* 7.4* 7.8*  HCT 39.5   < > 25.3* 21.8* 22.8*  MCV 97.8   < > 96.2 96.9 96.6  PLT 160   < > 93* 90* 116*   < > = values in this interval not displayed.   Cardiac Enzymes: No results for input(s): CKTOTAL, CKMB, CKMBINDEX, TROPONINI in the last 8760 hours. BNP: Invalid input(s): POCBNP Lab Results  Component Value Date   HGBA1C 5.9 (H) 10/08/2020   No results found for: TSH No results found for: VITAMINB12 No results found for: FOLATE No results found for: IRON, TIBC, FERRITIN  Imaging and Procedures obtained prior to SNF admission: CT HEAD WO CONTRAST  Result Date:  10/08/2020 CLINICAL DATA:  Head trauma from a fall. EXAM: CT HEAD WITHOUT CONTRAST TECHNIQUE: Contiguous axial images were obtained from the base of the skull through the vertex without intravenous contrast. COMPARISON:  None. FINDINGS: Brain: No evidence of acute infarction, hemorrhage, hydrocephalus, extra-axial collection or mass lesion/mass effect. Mild cerebral atrophy. Patchy low-attenuation changes in the deep white matter consistent with small vessel ischemic change. Vascular: No hyperdense vessel or unexpected calcification. Skull: Calvarium appears intact. Sinuses/Orbits: Paranasal sinuses and mastoid air cells are clear. Other: None. IMPRESSION: No acute intracranial abnormalities. Chronic atrophy and small vessel ischemic changes. Electronically Signed   By: Burman NievesWilliam  Stevens M.D.   On: 10/08/2020 20:30    CT PELVIS WO CONTRAST  Result Date: 10/08/2020 CLINICAL DATA:  Status post fall.  Right acetabular fracture. EXAM: CT PELVIS WITHOUT CONTRAST TECHNIQUE: Multidetector CT imaging of the pelvis was performed following the standard protocol without intravenous contrast. COMPARISON:  Hip films from earlier the same day FINDINGS: Urinary Tract:  Bladder is moderately distended. Bowel:  Unremarkable. Vascular/Lymphatic: Atherosclerotic calcification noted lower abdominal aorta and iliac arteries. Reproductive:  Prostate gland is enlarged. Other:  None. Musculoskeletal: Severely comminuted right acetabular fracture identified with fracture fragments distracted by at least 15 mm. Fracture involves the roof of the acetabulum, medial acetabulum and posterior lip of the acetabulum. Fracture extends cranially up into the iliac wing and anteriorly to involve the right superior pubic ramus. Associated comminuted fracture noted inferior right pubic ramus. Soft tissue windows demonstrate extensive hemorrhage in the extraperitoneal pelvic sidewall extending into the presacral space and right groin region. IMPRESSION: Severely comminuted right acetabular fracture involving the roof of the acetabulum, medial acetabulum and posterior lip. Fracture extends anteriorly into the superior pubic ramus. Associated comminuted fracture noted right inferior pubic ramus. Extensive hemorrhage in the soft tissues adjacent to the right hip, involving the extraperitoneal right pelvic sidewall. Electronically Signed   By: Kennith CenterEric  Mansell M.D.   On: 10/08/2020 18:30   DG Pelvis Comp Min 3V  Result Date: 10/09/2020 CLINICAL DATA:  Status post open reduction and internal fixation of the pelvis. Obtained in PACU. EXAM: JUDET PELVIS - 3+ VIEW COMPARISON:  CT pelvis 10/08/2020 FINDINGS: Status post plate and screw fixation of a right acetabular fracture. No new acute displaced fracture identified. No pelvic diastasis. No dislocation of bilateral hips.  No severe arthropathy or aggressive appearing focal bone lesion. Vascular calcifications. IMPRESSION: Status post plate and screw fixation of a right acetabular fracture. Electronically Signed   By: Tish FredericksonMorgane  Naveau M.D.   On: 10/09/2020 16:04   DG Pelvis Comp Min 3V  Result Date: 10/09/2020 CLINICAL DATA:  Open reduction and internal fixation of right acetabular fracture. EXAM: DG C-ARM 1-60 MIN; JUDET PELVIS - 3+ VIEW CONTRAST:  None. FLUOROSCOPY TIME:  Radiation Exposure Index (if provided by the fluoroscopic device): 5.8 mGy. Number of Acquired Spot Images: 8. COMPARISON:  None. FINDINGS: Eight intraoperative fluoroscopic images were obtained of the right pelvis. These images demonstrate surgical reduction and internal fixation of right acetabular fracture. Improved alignment of fracture components is noted. IMPRESSION: Fluoroscopic guidance provided during open reduction and internal fixation of right acetabular fracture. Electronically Signed   By: Lupita RaiderJames  Green Jr M.D.   On: 10/09/2020 15:27   DG C-Arm 1-60 Min  Result Date: 10/09/2020 CLINICAL DATA:  Open reduction and internal fixation of right acetabular fracture. EXAM: DG C-ARM 1-60 MIN; JUDET PELVIS - 3+ VIEW CONTRAST:  None. FLUOROSCOPY TIME:  Radiation Exposure Index (if provided by  the fluoroscopic device): 5.8 mGy. Number of Acquired Spot Images: 8. COMPARISON:  None. FINDINGS: Eight intraoperative fluoroscopic images were obtained of the right pelvis. These images demonstrate surgical reduction and internal fixation of right acetabular fracture. Improved alignment of fracture components is noted. IMPRESSION: Fluoroscopic guidance provided during open reduction and internal fixation of right acetabular fracture. Electronically Signed   By: Lupita Raider M.D.   On: 10/09/2020 15:27   DG Hip Unilat With Pelvis 2-3 Views Right  Result Date: 10/08/2020 CLINICAL DATA:  RIGHT hip pain status post fall EXAM: DG HIP (WITH OR WITHOUT PELVIS) 2-3V  RIGHT COMPARISON:  None. FINDINGS: There is protrusion of the RIGHT femoral head through the medial border of the acetabulum. There is discontinuity of the iliopectineal line on the RIGHT. No RIGHT femoral neck fracture. IMPRESSION: Fracture through the medial wall of the RIGHT acetabulum with protrusion of the femoral head. Electronically Signed   By: Genevive Bi M.D.   On: 10/08/2020 17:17    Assessment/Plan Parkinson's disease (HCC) On Sinemet Follows with Dr Greg [name] Closed displaced fracture of anterior wall of right acetabulum with routine healing, subsequent encounter [facility] Pain Controlled Discontinue Hydrocodone due to Hallucinations On Lovenoix Follow up with Ortho Hypertension, unspecified type New diagnosis On Coreg Will continue to follow Benign prostatic hyperplasia with urinary frequency Has not seen any Urologist yet Age-related osteoporosis without current pathological fracture Has taken Fosamax for 7 years  Stopped in 2013 Anemia due to blood loss Will write for iron for 4 weeks Repeat CBC pending  Family/ staff Communication: D/W Wife in the room   Labs/tests ordered:

## 2020-10-25 LAB — CBC AND DIFFERENTIAL
HCT: 27 — AB (ref 41–53)
Hemoglobin: 8.9 — AB (ref 13.5–17.5)
Neutrophils Absolute: 5013
Platelets: 293 (ref 150–399)
WBC: 7.1

## 2020-10-25 LAB — BASIC METABOLIC PANEL
BUN: 22 — AB (ref 4–21)
CO2: 28 — AB (ref 13–22)
Chloride: 102 (ref 99–108)
Creatinine: 1.1 (ref 0.6–1.3)
Glucose: 104
Potassium: 4.6 (ref 3.4–5.3)
Sodium: 134 — AB (ref 137–147)

## 2020-10-25 LAB — CBC: RBC: 2.73 — AB (ref 3.87–5.11)

## 2020-10-25 LAB — COMPREHENSIVE METABOLIC PANEL: Calcium: 8.2 — AB (ref 8.7–10.7)

## 2020-10-31 ENCOUNTER — Encounter: Payer: Medicare Other | Admitting: Internal Medicine

## 2020-11-12 ENCOUNTER — Encounter: Payer: Self-pay | Admitting: Nurse Practitioner

## 2020-11-12 ENCOUNTER — Non-Acute Institutional Stay (SKILLED_NURSING_FACILITY): Payer: Medicare Other | Admitting: Nurse Practitioner

## 2020-11-12 DIAGNOSIS — S90822A Blister (nonthermal), left foot, initial encounter: Secondary | ICD-10-CM

## 2020-11-12 DIAGNOSIS — E871 Hypo-osmolality and hyponatremia: Secondary | ICD-10-CM

## 2020-11-12 DIAGNOSIS — R609 Edema, unspecified: Secondary | ICD-10-CM

## 2020-11-12 DIAGNOSIS — L8995 Pressure ulcer of unspecified site, unstageable: Secondary | ICD-10-CM | POA: Insufficient documentation

## 2020-11-12 DIAGNOSIS — I10 Essential (primary) hypertension: Secondary | ICD-10-CM

## 2020-11-12 DIAGNOSIS — G20A1 Parkinson's disease without dyskinesia, without mention of fluctuations: Secondary | ICD-10-CM

## 2020-11-12 DIAGNOSIS — M159 Polyosteoarthritis, unspecified: Secondary | ICD-10-CM

## 2020-11-12 DIAGNOSIS — K5901 Slow transit constipation: Secondary | ICD-10-CM

## 2020-11-12 DIAGNOSIS — R600 Localized edema: Secondary | ICD-10-CM

## 2020-11-12 DIAGNOSIS — M150 Primary generalized (osteo)arthritis: Secondary | ICD-10-CM

## 2020-11-12 DIAGNOSIS — N401 Enlarged prostate with lower urinary tract symptoms: Secondary | ICD-10-CM

## 2020-11-12 DIAGNOSIS — G20 Parkinson's disease: Secondary | ICD-10-CM

## 2020-11-12 DIAGNOSIS — R350 Frequency of micturition: Secondary | ICD-10-CM

## 2020-11-12 DIAGNOSIS — M810 Age-related osteoporosis without current pathological fracture: Secondary | ICD-10-CM

## 2020-11-12 DIAGNOSIS — E785 Hyperlipidemia, unspecified: Secondary | ICD-10-CM

## 2020-11-12 DIAGNOSIS — M8949 Other hypertrophic osteoarthropathy, multiple sites: Secondary | ICD-10-CM

## 2020-11-12 DIAGNOSIS — R7303 Prediabetes: Secondary | ICD-10-CM | POA: Diagnosis not present

## 2020-11-12 DIAGNOSIS — D500 Iron deficiency anemia secondary to blood loss (chronic): Secondary | ICD-10-CM

## 2020-11-12 NOTE — Assessment & Plan Note (Signed)
diet control, Hgb a1c 5.9

## 2020-11-12 NOTE — Assessment & Plan Note (Signed)
,   diagnosed 2019, f/u Neurology, taking Sinemet.

## 2020-11-12 NOTE — Progress Notes (Signed)
Location:   [facility] Room Number: N27 Place of Service:  SNF (31) Provider:  Claborn Janusz Johnney Ou, NP  [name], MD  Patient Care Team: [name], MD as PCP - General (Internal Medicine) [name], Octaviano Batty, DO as Consulting Physician (Neurology)  Extended Emergency Contact Information Primary Emergency Contact: samvel, zinn Mobile Phone: [phone] Relation: Spouse Secondary Emergency Contact: jaylend, reiland Mobile Phone: [phone] Relation: Son  Code Status:  FULL CODE Goals of care: Advanced Directive information Advanced Directives 11/12/2020  Does Patient Have a Medical Advance Directive? Yes  Type of Advance Directive Living will  Does patient want to make changes to medical advance directive? No - Patient declined  Would patient like information on creating a medical advance directive? -     Chief Complaint  Patient presents with   Medical Management of Chronic Issues    Routine follow up   Health Maintenance    Discuss need for td/tdap vaccine and shingles vaccine.     HPI:  Pt is a 87 y.o. male seen today for medical management of chronic diseases.     Past Medical History:  Diagnosis Date   Acute bronchiolitis    Anemia    Bursitis of both hips    Hearing reduced    hearing aid   History of basal cell carcinoma    Hyperlipidemia    Malaise and fatigue    Osteoarthritis    hand   Osteoporosis    Palpitation    Vitamin D deficiency    Past Surgical History:  Procedure Laterality Date   CATARACT EXTRACTION  05/31/2015   Eye/right   COLONOSCOPY  02/05/2005, 03/06/2005   normal, 10 years ago   MOHS micrographic surgery     Face surgery   ORIF ACETABULAR FRACTURE Right 10/09/2020   Procedure: OPEN REDUCTION INTERNAL FIXATION (ORIF) ACETABULAR FRACTURE;  Surgeon: [name], MD;  Location: [facility] OR;  Service: Orthopedics;  Laterality: Right;    No Known Allergies  Allergies as of 11/12/2020   No Known Allergies       Medication List        Accurate as of November 12, 2020  1:28 PM. If you have any questions, ask your nurse or doctor.          STOP taking these medications    enoxaparin 30 MG/0.3ML injection Commonly known as: LOVENOX Stopped by: Kelby Lotspeich X Mele Sylvester, NP   HYDROcodone-acetaminophen 5-325 MG tablet Commonly known as: NORCO/VICODIN Stopped by: Takela Varden X Ladina Shutters, NP       TAKE these medications    acetaminophen 325 MG tablet Commonly known as: TYLENOL Take 1-2 tablets (325-650 mg total) by mouth every 6 (six) hours as needed for mild pain (pain score 1-3 or temp > 100.5).   CALCIUM-VITAMIN D3 PO Take 1 tablet by mouth in the morning and at bedtime.   carbidopa-levodopa 25-100 MG tablet Commonly known as: SINEMET IR Take 1 tablet by mouth 4 (four) times daily.   carbidopa-levodopa 50-200 MG tablet Commonly known as: SINEMET CR Take 1 tablet by mouth at bedtime.   carvedilol 3.125 MG tablet Commonly known as: COREG Take 1 tablet (3.125 mg total) by mouth 2 (two) times daily with a meal.   ferrous sulfate 325 (65 FE) MG tablet Take 325 mg by mouth 3 (three) times a week. Mon, Wed, Fri   polyethylene glycol 17 g packet Commonly known as: MiraLax Take 17 g by mouth daily.   senna 8.6  MG Tabs tablet Commonly known as: SENOKOT Take 1 tablet (8.6 mg total) by mouth daily.   zinc oxide 20 % ointment Apply 1 application topically as needed for irritation.        Review of Systems  Immunization History  Administered Date(s) Administered   H1N1 06/13/2008   Influenza Split 03/05/2006, 02/26/2007, 01/26/2008, 01/18/2009, 12/12/2009, 01/16/2011, 01/29/2012, 02/01/2013, 02/08/2014, 02/13/2015, 01/10/2016, 02/23/2017, 02/24/2018, 01/24/2019, 12/22/2019   PFIZER(Purple Top)SARS-COV-2 Vaccination 09/01/2020   Pneumococcal Conjugate-13 02/10/2014   Pneumococcal Polysaccharide-23 06/10/2011   Zoster, Live 07/04/2009   Pertinent  Health Maintenance Due  Topic Date Due   INFLUENZA  VACCINE  11/05/2020   PNA vac Low Risk Adult  Completed   Fall Risk  09/26/2020  Falls in the past year? 0  Number falls in past yr: 0  Injury with Fall? 0   Functional Status Survey:    Vitals:   11/12/20 1323  BP: 95/60  Pulse: 75  Resp: 18  Temp: (!) 97.5 F (36.4 C)  SpO2: 97%  Weight: 180 lb 6.4 oz (81.8 kg)  Height: 5\' 9"  (1.753 m)   Body mass index is 26.64 kg/m. Physical Exam  Labs reviewed: Recent Labs    10/09/20 0546 10/09/20 0937 10/10/20 0607 10/11/20 0037 10/12/20 0146  NA  --    < > 131* 129* 132*  K  --    < > 4.2 4.5 4.1  CL  --    < > 97* 96* 97*  CO2  --    < > 28 29 28   GLUCOSE  --    < > 134* 114* 122*  BUN  --    < > 23 26* 24*  CREATININE  --    < > 1.20 1.14 1.14  CALCIUM  --    < > 8.7* 8.6* 8.4*  MG  --   --  2.1  --   --   PHOS 3.4  --   --   --  3.5   < > = values in this interval not displayed.   Recent Labs    10/09/20 0937 10/11/20 0037 10/12/20 0146  AST 22 34  --   ALT 7 <5  --   ALKPHOS 31* 24*  --   BILITOT 1.7* 1.1  --   PROT 5.9* 4.6*  --   ALBUMIN 3.4* 2.6* 2.6*   Recent Labs    10/08/20 1722 10/09/20 0211 10/10/20 0607 10/11/20 0037 10/12/20 0146  WBC 11.7*   < > 13.8* 10.1 8.5  NEUTROABS 10.2*  --   --   --  6.4  HGB 13.2   < > 8.7* 7.4* 7.8*  HCT 39.5   < > 25.3* 21.8* 22.8*  MCV 97.8   < > 96.2 96.9 96.6  PLT 160   < > 93* 90* 116*   < > = values in this interval not displayed.   No results found for: TSH Lab Results  Component Value Date   HGBA1C 5.9 (H) 10/08/2020   No results found for: CHOL, HDL, LDLCALC, LDLDIRECT, TRIG, CHOLHDL  Significant Diagnostic Results in last 30 days:  No results found.  Assessment/Plan There are no diagnoses linked to this encounter.   Family/ staff Communication:   Labs/tests ordered:     This encounter was created in error - please disregard.

## 2020-11-12 NOTE — Assessment & Plan Note (Signed)
OA multiple sites, ambulates with walker prior to the right hip fx. WBAT

## 2020-11-12 NOTE — Assessment & Plan Note (Signed)
OP, took Fosamax x 6 years, stopped 2018, on Ca, Vit D

## 2020-11-12 NOTE — Assessment & Plan Note (Signed)
Urinary frequency, chronic, BPH

## 2020-11-12 NOTE — Assessment & Plan Note (Signed)
Iron x4 wks started 10/18/20,  Hgb 7.8>>8.9 10/25/20, update CBC

## 2020-11-12 NOTE — Assessment & Plan Note (Signed)
Edema BLE, blood filled blister L heel noted today. Denied SOB, cough, or palpitation. Will try Furosemide 20mg , Kcl qd, BMP one week.

## 2020-11-12 NOTE — Assessment & Plan Note (Signed)
132>>134 10/25/20 

## 2020-11-12 NOTE — Progress Notes (Signed)
Location:   SNF [employer] Room Number: 45 Place of Service:  SNF (31) Provider: [facility] Peja Allender NP  Virgie Dad, MD  Patient Care Team: Virgie Dad, MD as PCP - General (Internal Medicine) [name], Eustace Quail, DO as Consulting Physician (Neurology)  Extended Emergency Contact Information Primary Emergency Contact: jabe, jeanbaptiste Mobile Phone: [phone] Relation: Spouse Secondary Emergency Contact: zeddie, njie Mobile Phone: [phone] Relation: Son  Code Status:  DNR Goals of care: Advanced Directive information Advanced Directives 11/12/2020  Does Patient Have a Medical Advance Directive? Yes  Type of Advance Directive Living will  Does patient want to make changes to medical advance directive? No - Patient declined  Would patient like information on creating a medical advance directive? -     Chief Complaint  Patient presents with   Medical Management of Chronic Issues     HPI:  Pt is a 87 y.o. male seen today for an acute visit for    Hospitalized 10/08/20-10/12/20 right hip fx(right acetabular fracture),  s/p ORIF, WBAT, posterior hip precautions,  Tylenol  Edema BLE, blood filled blister L heel noted today. Denied SOB, cough, or palpitation.              Hyponatremia, 132>>134 10/25/20             Prediabetes, diet control, Hgb a1c 5.9             Parkinson's disease, diagnosed 2019, f/u Neurology, taking Sinemet.             Urinary frequency, chronic, BPH             OA multiple sites, ambulates with walker prior to the right hip fx.             OP, took Fosamax x 6 years, stopped 2018, on Ca, Vit D             Hyperlipidemia, not taking statin             HTN, low BP, on Carvedilol             Constipation, takes MiraLax, Senna.             Anemia,  Iron x4 wks started 10/18/20,  Hgb 7.8>>8.9 10/25/20   Past Medical History:  Diagnosis Date   Acute bronchiolitis    Anemia    Bursitis of both hips    Hearing reduced    hearing aid   History of  basal cell carcinoma    Hyperlipidemia    Malaise and fatigue    Osteoarthritis    hand   Osteoporosis    Palpitation    Vitamin D deficiency    Past Surgical History:  Procedure Laterality Date   CATARACT EXTRACTION  05/31/2015   Eye/right   COLONOSCOPY  02/05/2005, 03/06/2005   normal, 10 years ago   MOHS micrographic surgery     Face surgery   ORIF ACETABULAR FRACTURE Right 10/09/2020   Procedure: OPEN REDUCTION INTERNAL FIXATION (ORIF) ACETABULAR FRACTURE;  Surgeon: Altamese [place], MD;  Location: [facility];  Service: Orthopedics;  Laterality: Right;    No Known Allergies  Allergies as of 11/12/2020   No Known Allergies      Medication List        Accurate as of November 12, 2020 11:59 PM. If you have any questions, ask your nurse or doctor.          STOP taking these medications    enoxaparin  30 MG/0.3ML injection Commonly known as: LOVENOX Stopped by: Zaryia Markel X Starletta Houchin Bonneau, NP   HYDROcodone-acetaminophen 5-325 MG tablet Commonly known as: NORCO/VICODIN Stopped by:  X Geneal Huebert, NP       TAKE these medications    acetaminophen 325 MG tablet Commonly known as: TYLENOL Take 1-2 tablets (325-650 mg total) by mouth every 6 (six) hours as needed for mild pain (pain score 1-3 or temp > 100.5).   CALCIUM-VITAMIN D3 PO Take 1 tablet by mouth in the morning and at bedtime.   carbidopa-levodopa 25-100 MG tablet Commonly known as: SINEMET IR Take 1 tablet by mouth 4 (four) times daily.   carbidopa-levodopa 50-200 MG tablet Commonly known as: SINEMET CR Take 1 tablet by mouth at bedtime.   carvedilol 3.125 MG tablet Commonly known as: COREG Take 1 tablet (3.125 mg total) by mouth 2 (two) times daily with a meal.   ferrous sulfate 325 (65 FE) MG tablet Take 325 mg by mouth 3 (three) times a week. Mon, Wed, Fri   polyethylene glycol 17 g packet Commonly known as: MiraLax Take 17 g by mouth daily.   senna 8.6 MG Tabs tablet Commonly known as: SENOKOT Take 1 tablet (8.6 mg  total) by mouth daily.   zinc oxide 20 % ointment Apply 1 application topically as needed for irritation.        Review of Systems  Constitutional:  Negative for activity change, appetite change and fever.  HENT:  Negative for congestion and trouble swallowing.   Eyes:  Negative for visual disturbance.  Respiratory:  Negative for cough and shortness of breath.   Cardiovascular:  Positive for leg swelling. Negative for chest pain and palpitations.  Gastrointestinal:  Negative for abdominal pain and constipation.  Genitourinary:  Negative for difficulty urinating and urgency.       1-2x/night nocturnal urination.   Musculoskeletal:  Positive for arthralgias and gait problem.       Right hip pain.   Skin:  Negative for color change and pallor.  Neurological:  Positive for tremors. Negative for speech difficulty, weakness and headaches.  Psychiatric/Behavioral:  Positive for sleep disturbance. Negative for confusion. The patient is not nervous/anxious.        Not sleeping well, but declined sleeping aid.    Immunization History  Administered Date(s) Administered   H1N1 06/13/2008   Influenza Split 03/05/2006, 02/26/2007, 01/26/2008, 01/18/2009, 12/12/2009, 01/16/2011, 01/29/2012, 02/01/2013, 02/08/2014, 02/13/2015, 01/10/2016, 02/23/2017, 02/24/2018, 01/24/2019, 12/22/2019   PFIZER(Purple Top)SARS-COV-2 Vaccination 09/01/2020   Pneumococcal Conjugate-13 02/10/2014   Pneumococcal Polysaccharide-23 06/10/2011   Zoster, Live 07/04/2009   Pertinent  Health Maintenance Due  Topic Date Due   INFLUENZA VACCINE  11/05/2020   PNA vac Low Risk Adult  Completed   Fall Risk  09/26/2020  Falls in the past year? 0  Number falls in past yr: 0  Injury with Fall? 0   Functional Status Survey:    Vitals:   11/12/20 1503  BP: 95/60  Pulse: 78  Resp: 18  Temp: (!) 97.5 F (36.4 C)  SpO2: 97%  Weight: 180 lb 9.6 oz (81.9 kg)  Height: '5\' 9"'  (1.753 m)   Body mass index is 26.67  kg/m. Physical Exam Vitals reviewed.  Constitutional:      General: He is not in acute distress.    Appearance: Normal appearance. He is not ill-appearing, toxic-appearing or diaphoretic.  HENT:     Head: Normocephalic and atraumatic.     Nose: Nose normal.     Mouth/Throat:  Mouth: Mucous membranes are moist.  Eyes:     Extraocular Movements: Extraocular movements intact.     Conjunctiva/sclera: Conjunctivae normal.     Pupils: Pupils are equal, round, and reactive to light.  Cardiovascular:     Rate and Rhythm: Normal rate and regular rhythm.     Heart sounds: Murmur heard.  Pulmonary:     Effort: Pulmonary effort is normal.     Breath sounds: No rales.  Abdominal:     Palpations: Abdomen is soft.     Tenderness: There is no abdominal tenderness. There is no guarding or rebound.  Musculoskeletal:     Cervical back: Normal range of motion and neck supple. No rigidity.     Right lower leg: Edema present.     Left lower leg: Edema present.     Comments: 2+ edema BLE L>R, no pain in calves or with R+L foot dorsiflexion.   Skin:    General: Skin is warm and dry.     Comments: Left heel blood filled blister.   Neurological:     General: No focal deficit present.     Mental Status: He is alert and oriented to person, place, and time. Mental status is at baseline.     Motor: No weakness.     Gait: Gait abnormal.     Comments: Mild overall stiffness   Psychiatric:        Mood and Affect: Mood normal.        Behavior: Behavior normal.        Thought Content: Thought content normal.        Judgment: Judgment normal.    Labs reviewed: Recent Labs    10/09/20 0546 10/09/20 0937 10/10/20 0607 10/11/20 0037 10/12/20 0146  NA  --    < > 131* 129* 132*  K  --    < > 4.2 4.5 4.1  CL  --    < > 97* 96* 97*  CO2  --    < > '28 29 28  ' GLUCOSE  --    < > 134* 114* 122*  BUN  --    < > 23 26* 24*  CREATININE  --    < > 1.20 1.14 1.14  CALCIUM  --    < > 8.7* 8.6* 8.4*  MG   --   --  2.1  --   --   PHOS 3.4  --   --   --  3.5   < > = values in this interval not displayed.   Recent Labs    10/09/20 0937 10/11/20 0037 10/12/20 0146  AST 22 34  --   ALT 7 <5  --   ALKPHOS 31* 24*  --   BILITOT 1.7* 1.1  --   PROT 5.9* 4.6*  --   ALBUMIN 3.4* 2.6* 2.6*   Recent Labs    10/08/20 1722 10/09/20 0211 10/10/20 0607 10/11/20 0037 10/12/20 0146  WBC 11.7*   < > 13.8* 10.1 8.5  NEUTROABS 10.2*  --   --   --  6.4  HGB 13.2   < > 8.7* 7.4* 7.8*  HCT 39.5   < > 25.3* 21.8* 22.8*  MCV 97.8   < > 96.2 96.9 96.6  PLT 160   < > 93* 90* 116*   < > = values in this interval not displayed.   No results found for: TSH Lab Results  Component Value Date   HGBA1C 5.9 (H) 10/08/2020  No results found for: CHOL, HDL, LDLCALC, LDLDIRECT, TRIG, CHOLHDL  Significant Diagnostic Results in last 30 days:  No results found.  Assessment/Plan Peripheral edema Edema BLE, blood filled blister L heel noted today. Denied SOB, cough, or palpitation. Will try Furosemide 51m, Kcl 247m qd, BMP one week.   Blister of left heel Blood filled blister left heel, float heel while in bed to reduce pressure, continue protective dressing.   Hyponatremia 132>>134 10/25/20  Prediabetes diet control, Hgb a1c 5.9  Parkinson's disease ([facility], diagnosed 2019, f/u Neurology, taking Sinemet.  BPH (benign prostatic hyperplasia) Urinary frequency, chronic, BPH  Osteoarthritis, multiple sites OA multiple sites, ambulates with walker prior to the right hip fx. WBAT  Osteoporosis OP, took Fosamax x 6 years, stopped 2018, on Ca, Vit D  Hyperlipidemia not taking statin  Blood loss anemia  Iron x4 wks started 10/18/20,  Hgb 7.8>>8.9 10/25/20, update CBC  Slow transit constipation takes MiraLax, Senna. Stated last BM was day before yesterday which is normal for him.   HTN (hypertension) Low BP in 90s/60s, dc Coreg in setting of starting Furosemide for swelling legs. Weight 2x/wk,  VS q shift.     Family/ staff Communication: Plan of care reviewed with the patient and charge nurse.   Labs/tests ordered: BMP/eGFR, CBC   Time spend 35 minutes.

## 2020-11-12 NOTE — Assessment & Plan Note (Addendum)
Low BP in 90s/60s, dc Coreg in setting of starting Furosemide for swelling legs. Weight 2x/wk, VS q shift.

## 2020-11-12 NOTE — Assessment & Plan Note (Signed)
not taking statin.  

## 2020-11-12 NOTE — Assessment & Plan Note (Signed)
takes MiraLax, Senna. Stated last BM was day before yesterday which is normal for him.

## 2020-11-12 NOTE — Assessment & Plan Note (Signed)
Blood filled blister left heel, float heel while in bed to reduce pressure, continue protective dressing.

## 2020-11-13 ENCOUNTER — Encounter: Payer: Self-pay | Admitting: Nurse Practitioner

## 2020-11-19 LAB — BASIC METABOLIC PANEL
BUN: 20 (ref 4–21)
CO2: 27 — AB (ref 13–22)
Chloride: 98 — AB (ref 99–108)
Creatinine: 1 (ref 0.6–1.3)
Glucose: 89
Potassium: 4.5 (ref 3.4–5.3)
Sodium: 132 — AB (ref 137–147)

## 2020-11-19 LAB — CBC AND DIFFERENTIAL
HCT: 31 — AB (ref 41–53)
Hemoglobin: 9.9 — AB (ref 13.5–17.5)
Platelets: 206 (ref 150–399)
WBC: 6.2

## 2020-11-19 LAB — COMPREHENSIVE METABOLIC PANEL: Calcium: 8.2 — AB (ref 8.7–10.7)

## 2020-11-19 LAB — CBC: RBC: 3.28 — AB (ref 3.87–5.11)

## 2020-12-07 ENCOUNTER — Non-Acute Institutional Stay (SKILLED_NURSING_FACILITY): Payer: Medicare Other | Admitting: Nurse Practitioner

## 2020-12-07 ENCOUNTER — Encounter: Payer: Self-pay | Admitting: Nurse Practitioner

## 2020-12-07 DIAGNOSIS — M8949 Other hypertrophic osteoarthropathy, multiple sites: Secondary | ICD-10-CM

## 2020-12-07 DIAGNOSIS — E785 Hyperlipidemia, unspecified: Secondary | ICD-10-CM

## 2020-12-07 DIAGNOSIS — E871 Hypo-osmolality and hyponatremia: Secondary | ICD-10-CM | POA: Diagnosis not present

## 2020-12-07 DIAGNOSIS — N401 Enlarged prostate with lower urinary tract symptoms: Secondary | ICD-10-CM

## 2020-12-07 DIAGNOSIS — D500 Iron deficiency anemia secondary to blood loss (chronic): Secondary | ICD-10-CM

## 2020-12-07 DIAGNOSIS — R609 Edema, unspecified: Secondary | ICD-10-CM | POA: Diagnosis not present

## 2020-12-07 DIAGNOSIS — R350 Frequency of micturition: Secondary | ICD-10-CM

## 2020-12-07 DIAGNOSIS — L89620 Pressure ulcer of left heel, unstageable: Secondary | ICD-10-CM | POA: Diagnosis not present

## 2020-12-07 DIAGNOSIS — M810 Age-related osteoporosis without current pathological fracture: Secondary | ICD-10-CM

## 2020-12-07 DIAGNOSIS — G20 Parkinson's disease: Secondary | ICD-10-CM

## 2020-12-07 DIAGNOSIS — M159 Polyosteoarthritis, unspecified: Secondary | ICD-10-CM

## 2020-12-07 DIAGNOSIS — R7303 Prediabetes: Secondary | ICD-10-CM

## 2020-12-07 DIAGNOSIS — I10 Essential (primary) hypertension: Secondary | ICD-10-CM

## 2020-12-07 DIAGNOSIS — K5901 Slow transit constipation: Secondary | ICD-10-CM

## 2020-12-07 NOTE — Assessment & Plan Note (Signed)
diagnosed 2019, f/u Neurology, taking Sinemet.

## 2020-12-07 NOTE — Assessment & Plan Note (Signed)
takes MiraLax, Senna. 

## 2020-12-07 NOTE — Assessment & Plan Note (Signed)
not taking statin.  

## 2020-12-07 NOTE — Assessment & Plan Note (Signed)
Iron x4 wks started 10/18/20,  Hgb 9.9 11/19/20

## 2020-12-07 NOTE — Assessment & Plan Note (Signed)
Urinary frequency, no urinary retention.

## 2020-12-07 NOTE — Assessment & Plan Note (Signed)
ersisted swelling legs, right heel pressure wound. The patient denied cough, SOB, PND, palpitation. Edema BLE, ruptured blood filled blister L heel, on Furosemide 20mg  qd since 11/12/20. Increase Furosemide to 40mg  qd, Kcl 01/12/21 qd, BMP one week.

## 2020-12-07 NOTE — Assessment & Plan Note (Signed)
ruptured blood filled blister L heel, off pressure while in bed.

## 2020-12-07 NOTE — Assessment & Plan Note (Signed)
OA multiple sites, ambulates with walker prior to the right hip fx. 

## 2020-12-07 NOTE — Assessment & Plan Note (Signed)
took Fosamax x 6 years, stopped 2018, on Ca, Vit D

## 2020-12-07 NOTE — Assessment & Plan Note (Signed)
Na 132 11/19/20

## 2020-12-07 NOTE — Assessment & Plan Note (Signed)
Controlled blood pressure, off Carvedilol.

## 2020-12-07 NOTE — Assessment & Plan Note (Signed)
diet control, Hgb a1c 5.9

## 2020-12-07 NOTE — Progress Notes (Signed)
Location:   SNF [facility] Room Number: N27 Place of Service:  SNF (31) Provider: Arna Snipe Jyla Hopf NP  [name], MD  Patient Care Team: [name], MD as PCP - General (Internal Medicine) [name], Octaviano Batty, DO as Consulting Physician (Neurology)  Extended Emergency Contact Information Primary Emergency Contact: jude, naclerio Mobile Phone: [phone] Relation: Spouse Secondary Emergency Contact: rayshun, kandler Mobile Phone: [phone] Relation: Son  Code Status:  DNR Goals of care: Advanced Directive information Advanced Directives 12/07/2020  Does Patient Have a Medical Advance Directive? Yes  Type of Advance Directive Living will  Does patient want to make changes to medical advance directive? No - Patient declined  Would patient like information on creating a medical advance directive? -     Chief Complaint  Patient presents with   Acute Visit    Patient presents for swelling legs      HPI:  Pt is a 87 y.o. male seen today for an acute visit for persisted swelling legs, right heel pressure wound. The patient denied cough, SOB, PND, palpitation.  Edema BLE, ruptured blood filled blister L heel, on Furosemide 20mg  qd since 11/12/20  Hospitalized 10/08/20-10/12/20 right hip fx(right acetabular fracture),  s/p ORIF, WBAT, posterior hip precautions,  Tylenol             Hyponatremia, Na 132 11/19/20             Prediabetes, diet control, Hgb a1c 5.9             Parkinson's disease, diagnosed 2019, f/u Neurology, taking Sinemet.             Urinary frequency, chronic, BPH             OA multiple sites, ambulates with walker prior to the right hip fx.             OP, took Fosamax x 6 years, stopped 2018, on Ca, Vit D             Hyperlipidemia, not taking statin             HTN, off Carvedilol             Constipation, takes MiraLax, Senna.             Anemia,  Iron x4 wks started 10/18/20,  Hgb 9.9 11/19/20    Past Medical History:  Diagnosis Date   Acute  bronchiolitis    Anemia    Bursitis of both hips    Hearing reduced    hearing aid   History of basal cell carcinoma    Hyperlipidemia    Malaise and fatigue    Osteoarthritis    hand   Osteoporosis    Palpitation    Vitamin D deficiency    Past Surgical History:  Procedure Laterality Date   CATARACT EXTRACTION  05/31/2015   Eye/right   COLONOSCOPY  02/05/2005, 03/06/2005   normal, 10 years ago   MOHS micrographic surgery     Face surgery   ORIF ACETABULAR FRACTURE Right 10/09/2020   Procedure: OPEN REDUCTION INTERNAL FIXATION (ORIF) ACETABULAR FRACTURE;  Surgeon: 12/10/2020, MD;  Location: [facility];  Service: Orthopedics;  Laterality: Right;    No Known Allergies  Allergies as of 12/07/2020   No Known Allergies      Medication List        Accurate as of December 07, 2020 11:59 PM. If you have any questions, ask your nurse or doctor.  STOP taking these medications    carvedilol 3.125 MG tablet Commonly known as: COREG Stopped by: Dan Scearce X Lekita Kerekes, NP   ferrous sulfate 325 (65 FE) MG tablet Stopped by: Dai Mcadams X Caelen Reierson, NP       TAKE these medications    acetaminophen 325 MG tablet Commonly known as: TYLENOL Take 1-2 tablets (325-650 mg total) by mouth every 6 (six) hours as needed for mild pain (pain score 1-3 or temp > 100.5).   CALCIUM-VITAMIN D3 PO Take 1 tablet by mouth in the morning and at bedtime.   carbidopa-levodopa 25-100 MG tablet Commonly known as: SINEMET IR Take 1 tablet by mouth 4 (four) times daily.   carbidopa-levodopa 50-200 MG tablet Commonly known as: SINEMET CR Take 1 tablet by mouth at bedtime.   furosemide 20 MG tablet Commonly known as: LASIX Take 20 mg by mouth daily.   polyethylene glycol 17 g packet Commonly known as: MiraLax Take 17 g by mouth daily.   potassium chloride SA 20 MEQ tablet Commonly known as: KLOR-CON Take 20 mEq by mouth daily.   senna 8.6 MG Tabs tablet Commonly known as: SENOKOT Take 1 tablet  (8.6 mg total) by mouth daily.   zinc oxide 20 % ointment Apply 1 application topically as needed for irritation.        Review of Systems  Constitutional:  Positive for unexpected weight change. Negative for activity change, appetite change and fever.  HENT:  Negative for congestion and trouble swallowing.   Eyes:  Negative for visual disturbance.  Respiratory:  Negative for cough and shortness of breath.   Cardiovascular:  Positive for leg swelling. Negative for chest pain and palpitations.  Gastrointestinal:  Negative for abdominal pain and constipation.  Genitourinary:  Negative for difficulty urinating and urgency.       1-2x/night nocturnal urination.   Musculoskeletal:  Positive for arthralgias and gait problem.       Right hip pain.   Skin:  Negative for color change and pallor.  Neurological:  Positive for tremors. Negative for speech difficulty, weakness and headaches.  Psychiatric/Behavioral:  Positive for sleep disturbance. Negative for confusion. The patient is not nervous/anxious.        Not sleeping well, but declined sleeping aid.    Immunization History  Administered Date(s) Administered   H1N1 06/13/2008   Influenza Split 03/05/2006, 02/26/2007, 01/26/2008, 01/18/2009, 12/12/2009, 01/16/2011, 01/29/2012, 02/01/2013, 02/08/2014, 02/13/2015, 01/10/2016, 02/23/2017, 02/24/2018, 01/24/2019, 12/22/2019   PFIZER(Purple Top)SARS-COV-2 Vaccination 09/01/2020   Pneumococcal Conjugate-13 02/10/2014   Pneumococcal Polysaccharide-23 06/10/2011   Zoster, Live 07/04/2009   Pertinent  Health Maintenance Due  Topic Date Due   INFLUENZA VACCINE  11/05/2020   PNA vac Low Risk Adult  Completed   Fall Risk  09/26/2020  Falls in the past year? 0  Number falls in past yr: 0  Injury with Fall? 0   Functional Status Survey:    Vitals:   12/07/20 1555  BP: 113/67  Pulse: 79  Resp: 20  Temp: (!) 97.4 F (36.3 C)  SpO2: 95%  Weight: 189 lb 9.6 oz (86 kg)  Height: 5\' 9"   (1.753 m)   Body mass index is 28 kg/m. Physical Exam Vitals reviewed.  Constitutional:      General: He is not in acute distress.    Appearance: Normal appearance. He is not ill-appearing, toxic-appearing or diaphoretic.  HENT:     Head: Normocephalic and atraumatic.     Nose: Nose normal.     Mouth/Throat:  Mouth: Mucous membranes are moist.  Eyes:     Extraocular Movements: Extraocular movements intact.     Conjunctiva/sclera: Conjunctivae normal.     Pupils: Pupils are equal, round, and reactive to light.  Cardiovascular:     Rate and Rhythm: Normal rate and regular rhythm.     Heart sounds: Murmur heard.  Pulmonary:     Effort: Pulmonary effort is normal.     Breath sounds: No rales.  Abdominal:     Palpations: Abdomen is soft.     Tenderness: There is no abdominal tenderness. There is no guarding or rebound.  Musculoskeletal:     Cervical back: Normal range of motion and neck supple. No rigidity.     Right lower leg: Edema present.     Left lower leg: Edema present.     Comments: 2+ edema BLE L>R, no pain in calves or with R+L foot dorsiflexion.   Skin:    General: Skin is warm and dry.     Comments: Ruptured eft heel blood filled blister.   Neurological:     General: No focal deficit present.     Mental Status: He is alert and oriented to person, place, and time. Mental status is at baseline.     Motor: No weakness.     Gait: Gait abnormal.     Comments: Mild overall stiffness   Psychiatric:        Mood and Affect: Mood normal.        Behavior: Behavior normal.        Thought Content: Thought content normal.        Judgment: Judgment normal.    Labs reviewed: Recent Labs    10/09/20 0546 10/09/20 0937 10/10/20 0607 10/11/20 0037 10/12/20 0146  NA  --    < > 131* 129* 132*  K  --    < > 4.2 4.5 4.1  CL  --    < > 97* 96* 97*  CO2  --    < > 28 29 28   GLUCOSE  --    < > 134* 114* 122*  BUN  --    < > 23 26* 24*  CREATININE  --    < > 1.20 1.14  1.14  CALCIUM  --    < > 8.7* 8.6* 8.4*  MG  --   --  2.1  --   --   PHOS 3.4  --   --   --  3.5   < > = values in this interval not displayed.   Recent Labs    10/09/20 0937 10/11/20 0037 10/12/20 0146  AST 22 34  --   ALT 7 <5  --   ALKPHOS 31* 24*  --   BILITOT 1.7* 1.1  --   PROT 5.9* 4.6*  --   ALBUMIN 3.4* 2.6* 2.6*   Recent Labs    10/08/20 1722 10/09/20 0211 10/10/20 0607 10/11/20 0037 10/12/20 0146  WBC 11.7*   < > 13.8* 10.1 8.5  NEUTROABS 10.2*  --   --   --  6.4  HGB 13.2   < > 8.7* 7.4* 7.8*  HCT 39.5   < > 25.3* 21.8* 22.8*  MCV 97.8   < > 96.2 96.9 96.6  PLT 160   < > 93* 90* 116*   < > = values in this interval not displayed.   No results found for: TSH Lab Results  Component Value Date   HGBA1C 5.9 (H) 10/08/2020  No results found for: CHOL, HDL, LDLCALC, LDLDIRECT, TRIG, CHOLHDL  Significant Diagnostic Results in last 30 days:  No results found.  Assessment/Plan Peripheral edema ersisted swelling legs, right heel pressure wound. The patient denied cough, SOB, PND, palpitation. Edema BLE, ruptured blood filled blister L heel, on Furosemide 20mg  qd since 11/12/20. Increase Furosemide to 40mg  qd, Kcl 40meq qd, BMP one week.   Pressure ulcer, unstageable (HCC) ruptured blood filled blister L heel, off pressure while in bed.   Hyponatremia Na 132 11/19/20  Prediabetes  diet control, Hgb a1c 5.9  Parkinson's disease (HCC) diagnosed 2019, f/u Neurology, taking Sinemet.  BPH (benign prostatic hyperplasia) Urinary frequency, no urinary retention.   Osteoarthritis, multiple sites OA multiple sites, ambulates with walker prior to the right hip fx.  Osteoporosis took Fosamax x 6 years, stopped 2018, on Ca, Vit D  Hyperlipidemia not taking statin  HTN (hypertension) Controlled blood pressure, off Carvedilol.   Slow transit constipation takes MiraLax, Senna.  Blood loss anemia Iron x4 wks started 10/18/20,  Hgb 9.9 11/19/20    Family/  staff Communication: plan of care reviewed with the patient and charge nurse.   Labs/tests ordered:  BMP one week  Time spend 35 minutes.

## 2020-12-12 ENCOUNTER — Encounter: Payer: Self-pay | Admitting: Nurse Practitioner

## 2020-12-13 LAB — BASIC METABOLIC PANEL
BUN: 20 (ref 4–21)
CO2: 26 — AB (ref 13–22)
Chloride: 97 — AB (ref 99–108)
Creatinine: 1 (ref 0.6–1.3)
Glucose: 117
Potassium: 4.4 (ref 3.4–5.3)
Sodium: 131 — AB (ref 137–147)

## 2020-12-13 LAB — COMPREHENSIVE METABOLIC PANEL: Calcium: 8.8 (ref 8.7–10.7)

## 2020-12-20 ENCOUNTER — Encounter: Payer: Self-pay | Admitting: Internal Medicine

## 2020-12-20 ENCOUNTER — Non-Acute Institutional Stay (SKILLED_NURSING_FACILITY): Payer: Medicare Other | Admitting: Internal Medicine

## 2020-12-20 DIAGNOSIS — I10 Essential (primary) hypertension: Secondary | ICD-10-CM | POA: Diagnosis not present

## 2020-12-20 DIAGNOSIS — R609 Edema, unspecified: Secondary | ICD-10-CM | POA: Diagnosis not present

## 2020-12-20 DIAGNOSIS — G20 Parkinson's disease: Secondary | ICD-10-CM | POA: Diagnosis not present

## 2020-12-20 DIAGNOSIS — S91302A Unspecified open wound, left foot, initial encounter: Secondary | ICD-10-CM

## 2020-12-20 DIAGNOSIS — D500 Iron deficiency anemia secondary to blood loss (chronic): Secondary | ICD-10-CM

## 2020-12-20 DIAGNOSIS — M810 Age-related osteoporosis without current pathological fracture: Secondary | ICD-10-CM

## 2020-12-20 NOTE — Progress Notes (Signed)
Location:   [facility] Room Number: 27 Place of Service:  SNF [phone]) Provider:  [name] MD  [name], MD  Patient Care Team: [name], MD as PCP - General (Internal Medicine) [name], Octaviano Batty, DO as Consulting Physician (Neurology)  Extended Emergency Contact Information Primary Emergency Contact: henning, ehle Mobile Phone: [phone] Relation: Spouse Secondary Emergency Contact: jarmel, linhardt Mobile Phone: [phone] Relation: Son  Code Status:  Full Code Goals of care: Advanced Directive information Advanced Directives 12/07/2020  Does Patient Have a Medical Advance Directive? Yes  Type of Advance Directive Living will  Does patient want to make changes to medical advance directive? No - Patient declined  Would patient like information on creating a medical advance directive? -     Chief Complaint  Patient presents with   Acute Visit    HPI:  Pt is a 87 y.o. male seen today for an acute visit for  Bilateral Edema and Left Heel Wound  Patient has h/o Parkinson disease Follows with Dr [name] Diagnosed in 2019 in [place] Has ,Bradykinesia and Rigidity MRI of head show mild small vessel disease as well as a few old microhemorrhages in the right cerebellum and right cerebral hemisphere.  MRI of the cervical spine that same date demonstrating multilevel degenerative changes, most pronounced at C3-C4.   Also has h/o Osteoporosis , Vit D def Has been on Fosamax before, BPH, Arthritis,   Admitted in Hospital from 7/4-7/8 for Commuted Right Acetabulum Fracture ORIF on 7/06   Was NWB till this week when seen by Dr Carola Frost and now Can weight bear But has worsening Edema in his legs Has not responded to Lasix. No SOB  Also has DTI in his Left leg which has now opened and Nurses have noticed Odor coming from the Wound and discharge with Necrotic Slough in the middle Mild Discomfort No Fever .  Was able to walk with the therapy today      Past Medical History:  Diagnosis Date   Acute bronchiolitis    Anemia    Bursitis of both hips    Hearing reduced    hearing aid   History of basal cell carcinoma    Hyperlipidemia    Malaise and fatigue    Osteoarthritis    hand   Osteoporosis    Palpitation    Vitamin D deficiency    Past Surgical History:  Procedure Laterality Date   CATARACT EXTRACTION  05/31/2015   Eye/right   COLONOSCOPY  02/05/2005, 03/06/2005   normal, 10 years ago   MOHS micrographic surgery     Face surgery   ORIF ACETABULAR FRACTURE Right 10/09/2020   Procedure: OPEN REDUCTION INTERNAL FIXATION (ORIF) ACETABULAR FRACTURE;  Surgeon: [name], MD;  Location: [facility] OR;  Service: Orthopedics;  Laterality: Right;    No Known Allergies  Allergies as of 12/20/2020   No Known Allergies      Medication List        Accurate as of December 20, 2020 10:29 AM. If you have any questions, ask your nurse or doctor.          acetaminophen 325 MG tablet Commonly known as: TYLENOL Take 1-2 tablets (325-650 mg total) by mouth every 6 (six) hours as needed for mild pain (pain score 1-3 or temp > 100.5).   CALCIUM-VITAMIN D3 PO Take 1 tablet by mouth in the morning and at bedtime.   carbidopa-levodopa 25-100 MG tablet Commonly known as: SINEMET IR  Take 1 tablet by mouth 4 (four) times daily.   carbidopa-levodopa 50-200 MG tablet Commonly known as: SINEMET CR Take 1 tablet by mouth at bedtime.   furosemide 40 MG tablet Commonly known as: LASIX Take 40 mg by mouth daily.   polyethylene glycol 17 g packet Commonly known as: MiraLax Take 17 g by mouth daily.   potassium chloride SA 20 MEQ tablet Commonly known as: KLOR-CON Take 40 mEq by mouth daily.   senna 8.6 MG Tabs tablet Commonly known as: SENOKOT Take 1 tablet (8.6 mg total) by mouth daily.   zinc oxide 20 % ointment Apply 1 application topically as needed for irritation.        Review of Systems  Constitutional:   Positive for activity change.  HENT: Negative.    Respiratory: Negative.    Cardiovascular:  Positive for leg swelling.  Gastrointestinal: Negative.   Genitourinary: Negative.   Musculoskeletal:  Positive for gait problem.  Skin:  Positive for wound.  Neurological:  Positive for weakness.  Psychiatric/Behavioral: Negative.     Immunization History  Administered Date(s) Administered   H1N1 06/13/2008   Influenza Split 03/05/2006, 02/26/2007, 01/26/2008, 01/18/2009, 12/12/2009, 01/16/2011, 01/29/2012, 02/01/2013, 02/08/2014, 02/13/2015, 01/10/2016, 02/23/2017, 02/24/2018, 01/24/2019, 12/22/2019   PFIZER(Purple Top)SARS-COV-2 Vaccination 09/01/2020   Pneumococcal Conjugate-13 02/10/2014   Pneumococcal Polysaccharide-23 06/10/2011   Zoster, Live 07/04/2009   Pertinent  Health Maintenance Due  Topic Date Due   INFLUENZA VACCINE  11/05/2020   PNA vac Low Risk Adult  Completed   Fall Risk  09/26/2020  Falls in the past year? 0  Number falls in past yr: 0  Injury with Fall? 0   Functional Status Survey:    Vitals:   12/20/20 1015  BP: 122/74  Pulse: 74  Resp: 20  Temp: 97.8 F (36.6 C)  SpO2: 95%  Weight: 192 lb 3.2 oz (87.2 kg)  Height: 5\' 9"  (1.753 m)   Body mass index is 28.38 kg/m. Physical Exam Constitutional: Oriented to person, place, and time. Well-developed and well-nourished.  HENT:  Head: Normocephalic.  Mouth/Throat: Oropharynx is clear and moist.  Eyes: Pupils are equal, round, and reactive to light.  Neck: Neck supple.  Cardiovascular: Normal rate and normal heart sounds.  No murmur heard. Pulmonary/Chest: Effort normal and breath sounds normal. No respiratory distress. No wheezes. She has no rales.  Abdominal: Soft. Bowel sounds are normal. No distension. There is no tenderness. There is no rebound.  Musculoskeletal: Bilateral Edema  Left More then right Lymphadenopathy: none Neurological: Alert and oriented to person, place, and time.  Skin: Skin is  warm and dry.  Left Heel Wound Odor Present Slough present No redness or Pain Psychiatric: Normal mood and affect. Behavior is normal. Thought content normal.   Labs reviewed: Recent Labs    10/09/20 0546 10/09/20 0937 10/10/20 0607 10/11/20 0037 10/12/20 0146 10/25/20 0000 11/19/20 0000 12/13/20 0000  NA  --    < > 131* 129* 132* 134* 132* 131*  K  --    < > 4.2 4.5 4.1 4.6 4.5 4.4  CL  --    < > 97* 96* 97* 102 98* 97*  CO2  --    < > 28 29 28  28* 27* 26*  GLUCOSE  --    < > 134* 114* 122*  --   --   --   BUN  --    < > 23 26* 24* 22* 20 20  CREATININE  --    < >  1.20 1.14 1.14 1.1 1.0 1.0  CALCIUM  --    < > 8.7* 8.6* 8.4* 8.2* 8.2* 8.8  MG  --   --  2.1  --   --   --   --   --   PHOS 3.4  --   --   --  3.5  --   --   --    < > = values in this interval not displayed.   Recent Labs    10/09/20 0937 10/11/20 0037 10/12/20 0146  AST 22 34  --   ALT 7 <5  --   ALKPHOS 31* 24*  --   BILITOT 1.7* 1.1  --   PROT 5.9* 4.6*  --   ALBUMIN 3.4* 2.6* 2.6*   Recent Labs    10/08/20 1722 10/09/20 0211 10/10/20 0607 10/11/20 0037 10/12/20 0146 10/25/20 0000 11/19/20 0000  WBC 11.7*   < > 13.8* 10.1 8.5 7.1 6.2  NEUTROABS 10.2*  --   --   --  6.4 5,013.00  --   HGB 13.2   < > 8.7* 7.4* 7.8* 8.9* 9.9*  HCT 39.5   < > 25.3* 21.8* 22.8* 27* 31*  MCV 97.8   < > 96.2 96.9 96.6  --   --   PLT 160   < > 93* 90* 116* 293 206   < > = values in this interval not displayed.   No results found for: TSH Lab Results  Component Value Date   HGBA1C 5.9 (H) 10/08/2020   No results found for: CHOL, HDL, LDLCALC, LDLDIRECT, TRIG, CHOLHDL  Significant Diagnostic Results in last 30 days:  No results found.  Assessment/Plan Non-healing open wound of left heel, initial encounter Xray to Rule out Osteomyelitis Urgent referal to Wound Care Till then Santyl and Foam dressing  Peripheral edema Dopplers of Legs for DVT Was on Lovenox before for 21 days Also Change Lasix to Demadex  for better control due to wound now Demadex 40 mg 3 days And then 20 mg Repeat BMP for follow Sodium  Parkinson's disease (HCC) Dr [name] On Sinemet Hypertension, unspecified type Off  Coreg now BP stable on Diuretics Anemia due to blood loss Hgb stable Age-related osteoporosis without current pathological fracture Has taken Fosamax for 7 years  Stopped in 2013 Start on Calcium and Vit D  Discussed with Wife and Son in the Room Family/ staff Communication:   Labs/tests ordered:

## 2020-12-21 ENCOUNTER — Encounter: Payer: Self-pay | Admitting: Orthopedic Surgery

## 2020-12-21 ENCOUNTER — Non-Acute Institutional Stay (SKILLED_NURSING_FACILITY): Payer: Medicare Other | Admitting: Orthopedic Surgery

## 2020-12-21 DIAGNOSIS — I82411 Acute embolism and thrombosis of right femoral vein: Secondary | ICD-10-CM

## 2020-12-21 NOTE — Progress Notes (Signed)
Location:  [facility] Room Number: 27 Place of Service:  SNF [phone]) Provider:  Hazle Nordmann, AGNP-C  [name], MD  Patient Care Team: [name], MD as PCP - General (Internal Medicine) [name], Octaviano Batty, DO as Consulting Physician (Neurology)  Extended Emergency Contact Information Primary Emergency Contact: jiles, goya Mobile Phone: [phone] Relation: Spouse Secondary Emergency Contact: deiondre, harrower Mobile Phone: [phone] Relation: Son  Code Status:  Full code Goals of care: Advanced Directive information Advanced Directives 12/07/2020  Does Patient Have a Medical Advance Directive? Yes  Type of Advance Directive Living will  Does patient want to make changes to medical advance directive? No - Patient declined  Would patient like information on creating a medical advance directive? -     Chief Complaint  Patient presents with   Acute Visit    HPI:  Pt is a 87 y.o. male seen today for acute visit due to leg swelling.   He currently resides on the skilled unit at [facility] due to recent hospitalization for commuted right acetabular fracture 07/04-07/08. ORIF 07/06 by Dr. Carola Frost. He has completed 21-day treatment of lovenox for dvt prophylaxis. He followed up with surgery last week and is able to bear weight on surgical leg. In the past few days he has had worsening edema to both lower extremities. Seen by Dr. Chales Abrahams doppler of both legs ordered 09/15. Today, doppler results reveal sub occlusive thrombosis to RLE CFV/ proximal femoral vein. At this time, he denies pain to lower extremities, chest pain or sob.    Past Medical History:  Diagnosis Date   Acute bronchiolitis    Anemia    Bursitis of both hips    Hearing reduced    hearing aid   History of basal cell carcinoma    Hyperlipidemia    Malaise and fatigue    Osteoarthritis    hand   Osteoporosis    Palpitation    Vitamin D deficiency    Past Surgical History:   Procedure Laterality Date   CATARACT EXTRACTION  05/31/2015   Eye/right   COLONOSCOPY  02/05/2005, 03/06/2005   normal, 10 years ago   MOHS micrographic surgery     Face surgery   ORIF ACETABULAR FRACTURE Right 10/09/2020   Procedure: OPEN REDUCTION INTERNAL FIXATION (ORIF) ACETABULAR FRACTURE;  Surgeon: [name], MD;  Location: [facility] OR;  Service: Orthopedics;  Laterality: Right;    No Known Allergies  Outpatient Encounter Medications as of 12/21/2020  Medication Sig   acetaminophen (TYLENOL) 325 MG tablet Take 1-2 tablets (325-650 mg total) by mouth every 6 (six) hours as needed for mild pain (pain score 1-3 or temp > 100.5).   Calcium Carbonate-Vitamin D (CALCIUM-VITAMIN D3 PO) Take 1 tablet by mouth in the morning and at bedtime.   carbidopa-levodopa (SINEMET CR) 50-200 MG tablet Take 1 tablet by mouth at bedtime.   carbidopa-levodopa (SINEMET IR) 25-100 MG tablet Take 1 tablet by mouth 4 (four) times daily.   furosemide (LASIX) 40 MG tablet Take 40 mg by mouth daily.   polyethylene glycol (MIRALAX) 17 g packet Take 17 g by mouth daily.   potassium chloride SA (KLOR-CON) 20 MEQ tablet Take 40 mEq by mouth daily.   senna (SENOKOT) 8.6 MG TABS tablet Take 1 tablet (8.6 mg total) by mouth daily.   zinc oxide 20 % ointment Apply 1 application topically as needed for irritation.   No facility-administered encounter medications on file as of 12/21/2020.  Review of Systems  Constitutional:  Negative for activity change, appetite change, fatigue and fever.  Respiratory:  Negative for cough, shortness of breath and wheezing.   Cardiovascular:  Positive for leg swelling. Negative for chest pain.  Musculoskeletal:  Positive for arthralgias and gait problem.  Psychiatric/Behavioral:  Negative for dysphoric mood. The patient is not nervous/anxious.    Immunization History  Administered Date(s) Administered   H1N1 06/13/2008   Influenza Split 03/05/2006, 02/26/2007, 01/26/2008,  01/18/2009, 12/12/2009, 01/16/2011, 01/29/2012, 02/01/2013, 02/08/2014, 02/13/2015, 01/10/2016, 02/23/2017, 02/24/2018, 01/24/2019, 12/22/2019   PFIZER(Purple Top)SARS-COV-2 Vaccination 09/01/2020   Pneumococcal Conjugate-13 02/10/2014   Pneumococcal Polysaccharide-23 06/10/2011   Zoster, Live 07/04/2009   Pertinent  Health Maintenance Due  Topic Date Due   INFLUENZA VACCINE  11/05/2020   Fall Risk  09/26/2020  Falls in the past year? 0  Number falls in past yr: 0  Injury with Fall? 0   Functional Status Survey:    Vitals:   12/21/20 1542  BP: 122/74  Pulse: 74  Resp: 20  Temp: 97.9 F (36.6 C)  SpO2: 95%  Weight: 191 lb 8 oz (86.9 kg)  Height: 5\' 9"  (1.753 m)   Body mass index is 28.28 kg/m. Physical Exam Vitals reviewed.  Constitutional:      General: He is not in acute distress. HENT:     Head: Normocephalic.  Cardiovascular:     Rate and Rhythm: Normal rate and regular rhythm.     Pulses: Normal pulses.     Heart sounds: Normal heart sounds. No murmur heard. Pulmonary:     Effort: Pulmonary effort is normal. No respiratory distress.     Breath sounds: Normal breath sounds. No wheezing.  Musculoskeletal:     Right lower leg: Edema present.     Left lower leg: Edema present.     Comments: Swelling and warmth to left and right upper thighs, right appears more swollen than left.   Skin:    General: Skin is warm and dry.     Capillary Refill: Capillary refill takes less than 2 seconds.  Neurological:     General: No focal deficit present.     Mental Status: He is alert and oriented to person, place, and time.  Psychiatric:        Mood and Affect: Mood normal.        Behavior: Behavior normal.    Labs reviewed: Recent Labs    10/09/20 0546 10/09/20 0937 10/10/20 0607 10/11/20 0037 10/12/20 0146 10/25/20 0000 11/19/20 0000 12/13/20 0000  NA  --    < > 131* 129* 132* 134* 132* 131*  K  --    < > 4.2 4.5 4.1 4.6 4.5 4.4  CL  --    < > 97* 96* 97* 102  98* 97*  CO2  --    < > 28 29 28  28* 27* 26*  GLUCOSE  --    < > 134* 114* 122*  --   --   --   BUN  --    < > 23 26* 24* 22* 20 20  CREATININE  --    < > 1.20 1.14 1.14 1.1 1.0 1.0  CALCIUM  --    < > 8.7* 8.6* 8.4* 8.2* 8.2* 8.8  MG  --   --  2.1  --   --   --   --   --   PHOS 3.4  --   --   --  3.5  --   --   --    < > =  values in this interval not displayed.   Recent Labs    10/09/20 0937 10/11/20 0037 10/12/20 0146  AST 22 34  --   ALT 7 <5  --   ALKPHOS 31* 24*  --   BILITOT 1.7* 1.1  --   PROT 5.9* 4.6*  --   ALBUMIN 3.4* 2.6* 2.6*   Recent Labs    10/08/20 1722 10/09/20 0211 10/10/20 0607 10/11/20 0037 10/12/20 0146 10/25/20 0000 11/19/20 0000  WBC 11.7*   < > 13.8* 10.1 8.5 7.1 6.2  NEUTROABS 10.2*  --   --   --  6.4 5,013.00  --   HGB 13.2   < > 8.7* 7.4* 7.8* 8.9* 9.9*  HCT 39.5   < > 25.3* 21.8* 22.8* 27* 31*  MCV 97.8   < > 96.2 96.9 96.6  --   --   PLT 160   < > 93* 90* 116* 293 206   < > = values in this interval not displayed.   No results found for: TSH Lab Results  Component Value Date   HGBA1C 5.9 (H) 10/08/2020   No results found for: CHOL, HDL, LDLCALC, LDLDIRECT, TRIG, CHOLHDL  Significant Diagnostic Results in last 30 days:  No results found.  Assessment/Plan 1. DVT of deep femoral vein, right (HCC) - 09/16 doppler revealed thrombosis to RLE CFV/ proximal femoral - RLE appears more swollen than left, skin warm to touch - suspect due to hip surgery and decreased mobility - start Eliquis 10 mg po bid x 7 days, then decrease to 5 mg po bid for 3-5 months  Family/ staff Communication: plan discussed with patient and nurse  Labs/tests ordered:  none

## 2020-12-22 DIAGNOSIS — I82411 Acute embolism and thrombosis of right femoral vein: Secondary | ICD-10-CM | POA: Insufficient documentation

## 2020-12-22 MED ORDER — APIXABAN 5 MG PO TABS: 10.0000 mg | ORAL_TABLET | Freq: Two times a day (BID) | ORAL | 0 refills | Status: DC

## 2020-12-22 MED ORDER — APIXABAN 5 MG PO TABS: 5.0000 mg | ORAL_TABLET | Freq: Two times a day (BID) | ORAL | 3 refills | Status: DC

## 2020-12-25 ENCOUNTER — Encounter: Payer: Self-pay | Admitting: Orthopedic Surgery

## 2020-12-25 ENCOUNTER — Non-Acute Institutional Stay (SKILLED_NURSING_FACILITY): Payer: Medicare Other | Admitting: Orthopedic Surgery

## 2020-12-25 DIAGNOSIS — R410 Disorientation, unspecified: Secondary | ICD-10-CM | POA: Diagnosis not present

## 2020-12-25 DIAGNOSIS — I82411 Acute embolism and thrombosis of right femoral vein: Secondary | ICD-10-CM | POA: Diagnosis not present

## 2020-12-25 DIAGNOSIS — S91302D Unspecified open wound, left foot, subsequent encounter: Secondary | ICD-10-CM

## 2020-12-25 NOTE — Progress Notes (Addendum)
Location:  [facility] Room Number: 27 Place of Service:  SNF [phone]) Provider:  Windell Moulding NP  Virgie Dad, MD  Patient Care Team: Virgie Dad, MD as PCP - General (Internal Medicine) [name], Eustace Quail, DO as Consulting Physician (Neurology)  Extended Emergency Contact Information Primary Emergency Contact: demaris, leavell Mobile Phone: [phone] Relation: Spouse Secondary Emergency Contact: vicky, mccanless Mobile Phone: [phone] Relation: Son  Code Status:  Full code Goals of care: Advanced Directive information Advanced Directives 12/07/2020  Does Patient Have a Medical Advance Directive? Yes  Type of Advance Directive Living will  Does patient want to make changes to medical advance directive? No - Patient declined  Would patient like information on creating a medical advance directive? -     Chief Complaint  Patient presents with   Acute Visit    HPI:  Pt is a 87 y.o. male seen today for acute visit due to bilateral ankle edema and left heel wound.   Deep tissue injury to left heel, opened 09/14. He continues to have necrotic slough. Nursing reports worsening odor from wound today. Remains afebrile. Denies pain. Able to participate in PT/OT. 09/16 xray of left foot negative for osteomyelitis. He is scheduled to f/u with wound care 02/05/2021.   09/16 he was diagnosed with DVT to right femoral vein. He was started on eliquis. Today, RLE without warmth and swelling. Denies chest pain or sob.   In the past 24 hours nursing staff reports he is more confused. He is alert, oriented to familiar faces, place and situation. Disoriented to time. He was able to recall some of our conversation from last week. CT head 07/04 noted chronic atrophy and small vessel disease changes. No recent behavioral outbursts.      Past Medical History:  Diagnosis Date   Acute bronchiolitis    Anemia    Bursitis of both hips    Hearing reduced    hearing aid    History of basal cell carcinoma    Hyperlipidemia    Malaise and fatigue    Osteoarthritis    hand   Osteoporosis    Palpitation    Vitamin D deficiency    Past Surgical History:  Procedure Laterality Date   CATARACT EXTRACTION  05/31/2015   Eye/right   COLONOSCOPY  02/05/2005, 03/06/2005   normal, 10 years ago   MOHS micrographic surgery     Face surgery   ORIF ACETABULAR FRACTURE Right 10/09/2020   Procedure: OPEN REDUCTION INTERNAL FIXATION (ORIF) ACETABULAR FRACTURE;  Surgeon: Altamese Powell, MD;  Location: [facility];  Service: Orthopedics;  Laterality: Right;    No Known Allergies  Outpatient Encounter Medications as of 12/25/2020  Medication Sig   acetaminophen (TYLENOL) 325 MG tablet Take 1-2 tablets (325-650 mg total) by mouth every 6 (six) hours as needed for mild pain (pain score 1-3 or temp > 100.5).   apixaban (ELIQUIS) 5 MG TABS tablet Take 2 tablets (10 mg total) by mouth 2 (two) times daily for 7 days.   [START ON 12/29/2020] apixaban (ELIQUIS) 5 MG TABS tablet Take 1 tablet (5 mg total) by mouth 2 (two) times daily.   Calcium Carbonate-Vitamin D (CALCIUM-VITAMIN D3 PO) Take 1 tablet by mouth in the morning and at bedtime.   carbidopa-levodopa (SINEMET CR) 50-200 MG tablet Take 1 tablet by mouth at bedtime.   carbidopa-levodopa (SINEMET IR) 25-100 MG tablet Take 1 tablet by mouth 4 (four) times daily.   polyethylene glycol (MIRALAX) 17  g packet Take 17 g by mouth daily.   potassium chloride SA (KLOR-CON) 20 MEQ tablet Take 40 mEq by mouth daily.   senna (SENOKOT) 8.6 MG TABS tablet Take 1 tablet (8.6 mg total) by mouth daily.   torsemide (DEMADEX) 20 MG tablet Take 20 mg by mouth daily. Take 40 mg for 3 days and then 20 mg QD   zinc oxide 20 % ointment Apply 1 application topically as needed for irritation.   No facility-administered encounter medications on file as of 12/25/2020.    Review of Systems  Constitutional:  Negative for activity change, appetite change,  fatigue and fever.  Respiratory:  Negative for cough, shortness of breath and wheezing.   Cardiovascular:  Positive for leg swelling. Negative for chest pain.  Musculoskeletal:  Positive for arthralgias, gait problem and myalgias.  Skin:  Positive for wound.  Psychiatric/Behavioral:  Positive for confusion. Negative for dysphoric mood. The patient is not nervous/anxious.    Immunization History  Administered Date(s) Administered   H1N1 06/13/2008   Influenza Split 03/05/2006, 02/26/2007, 01/26/2008, 01/18/2009, 12/12/2009, 01/16/2011, 01/29/2012, 02/01/2013, 02/08/2014, 02/13/2015, 01/10/2016, 02/23/2017, 02/24/2018, 01/24/2019, 12/22/2019   PFIZER(Purple Top)SARS-COV-2 Vaccination 09/01/2020   Pneumococcal Conjugate-13 02/10/2014   Pneumococcal Polysaccharide-23 06/10/2011   Zoster, Live 07/04/2009   Pertinent  Health Maintenance Due  Topic Date Due   INFLUENZA VACCINE  11/05/2020   Fall Risk  09/26/2020  Falls in the past year? 0  Number falls in past yr: 0  Injury with Fall? 0   Functional Status Survey:    Vitals:   12/25/20 1203  BP: 122/74  Pulse: 74  Resp: 20  Temp: 97.7 F (36.5 C)  SpO2: 95%  Weight: 191 lb 8 oz (86.9 kg)  Height: '5\' 9"'  (1.753 m)   Body mass index is 28.28 kg/m. Physical Exam Vitals reviewed.  Constitutional:      General: He is not in acute distress. HENT:     Head: Normocephalic.  Cardiovascular:     Rate and Rhythm: Normal rate and regular rhythm.     Pulses: Normal pulses.     Heart sounds: Normal heart sounds. No murmur heard. Pulmonary:     Effort: Pulmonary effort is normal. No respiratory distress.     Breath sounds: Normal breath sounds. No wheezing.  Abdominal:     General: Bowel sounds are normal. There is no distension.     Palpations: Abdomen is soft.     Tenderness: There is no abdominal tenderness.  Musculoskeletal:     Right lower leg: No edema.     Left lower leg: Edema present.     Comments: 2+pitting, left foot  warm to touch, non-tender. Dorsal pedis 1+.   Skin:    General: Skin is warm and dry.     Capillary Refill: Capillary refill takes less than 2 seconds.     Findings: Lesion present.     Comments: Left heel with deep tissue injury,out 4-5 cm in diameter, wound open with necrotic tissue present, drainage purulent, odor pungent, cannot visualize granulation tissue in wound bed.   Neurological:     General: No focal deficit present.     Mental Status: He is alert. Mental status is at baseline.     Motor: Weakness present.     Gait: Gait abnormal.  Psychiatric:        Mood and Affect: Mood normal.        Behavior: Behavior normal.    Labs reviewed: Recent Labs  10/09/20 0546 10/09/20 2409 10/10/20 7353 10/11/20 0037 10/12/20 0146 10/25/20 0000 11/19/20 0000 12/13/20 0000  NA  --    < > 131* 129* 132* 134* 132* 131*  K  --    < > 4.2 4.5 4.1 4.6 4.5 4.4  CL  --    < > 97* 96* 97* 102 98* 97*  CO2  --    < > '28 29 28 ' 28* 27* 26*  GLUCOSE  --    < > 134* 114* 122*  --   --   --   BUN  --    < > 23 26* 24* 22* 20 20  CREATININE  --    < > 1.20 1.14 1.14 1.1 1.0 1.0  CALCIUM  --    < > 8.7* 8.6* 8.4* 8.2* 8.2* 8.8  MG  --   --  2.1  --   --   --   --   --   PHOS 3.4  --   --   --  3.5  --   --   --    < > = values in this interval not displayed.   Recent Labs    10/09/20 0937 10/11/20 0037 10/12/20 0146  AST 22 34  --   ALT 7 <5  --   ALKPHOS 31* 24*  --   BILITOT 1.7* 1.1  --   PROT 5.9* 4.6*  --   ALBUMIN 3.4* 2.6* 2.6*   Recent Labs    10/08/20 1722 10/09/20 0211 10/10/20 0607 10/11/20 0037 10/12/20 0146 10/25/20 0000 11/19/20 0000  WBC 11.7*   < > 13.8* 10.1 8.5 7.1 6.2  NEUTROABS 10.2*  --   --   --  6.4 5,013.00  --   HGB 13.2   < > 8.7* 7.4* 7.8* 8.9* 9.9*  HCT 39.5   < > 25.3* 21.8* 22.8* 27* 31*  MCV 97.8   < > 96.2 96.9 96.6  --   --   PLT 160   < > 93* 90* 116* 293 206   < > = values in this interval not displayed.   No results found for:  TSH Lab Results  Component Value Date   HGBA1C 5.9 (H) 10/08/2020   No results found for: CHOL, HDL, LDLCALC, LDLDIRECT, TRIG, CHOLHDL  Significant Diagnostic Results in last 30 days:  No results found.  Assessment/Plan 1. Non-healing open wound of left heel, subsequent encounter - pungent odor and necrotic tissue observed - left ankle 2+ edema, some erythema - xray negative for osteomyelitis 09/16 - wound culture today - cbc/diff, sed rate, CRP- WBC 10, sed rate 55, CRP 73.7 12/25/2020 - Rocephin 2g IM once today - Rocephin 1g IM daily x 2 days- give 09/21 and 09/22 - doxycycline 100 mg po bid x 14 days - Wound care appointment moved up to 09/30- Dr. Dellia Nims - will recommend he see Dr. Sharol Given if interventions unsuccessful  2. DVT of deep femoral vein, right (HCC) - RLE swelling and warmth improved  - denies chest pain and sob - cont Eliquis   3. Confusion - appropriate today, confused with time - no behavioral outbursts - cmp- Na+ 133 12/25/2020    Family/ staff Communication: plan discussed with patient and nurse  Labs/tests ordered:  cbc/diff, cmp, ESR, CRP

## 2020-12-26 LAB — COMPREHENSIVE METABOLIC PANEL
Albumin: 3.7 (ref 3.5–5.0)
Calcium: 9.3 (ref 8.7–10.7)
Globulin: 2.8

## 2020-12-26 LAB — BASIC METABOLIC PANEL
BUN: 28 — AB (ref 4–21)
CO2: 31 — AB (ref 13–22)
Chloride: 94 — AB (ref 99–108)
Creatinine: 1.2 (ref 0.6–1.3)
Glucose: 88
Potassium: 4.9 (ref 3.4–5.3)
Sodium: 133 — AB (ref 137–147)

## 2020-12-26 LAB — HEPATIC FUNCTION PANEL
ALT: 9 — AB (ref 10–40)
AST: 19 (ref 14–40)
Alkaline Phosphatase: 55 (ref 25–125)

## 2020-12-26 LAB — POCT ERYTHROCYTE SEDIMENTATION RATE, NON-AUTOMATED: Sed Rate: 55

## 2020-12-27 ENCOUNTER — Non-Acute Institutional Stay (SKILLED_NURSING_FACILITY): Payer: Medicare Other | Admitting: Internal Medicine

## 2020-12-27 ENCOUNTER — Encounter: Payer: Self-pay | Admitting: Internal Medicine

## 2020-12-27 DIAGNOSIS — R609 Edema, unspecified: Secondary | ICD-10-CM

## 2020-12-27 DIAGNOSIS — S91302D Unspecified open wound, left foot, subsequent encounter: Secondary | ICD-10-CM | POA: Diagnosis not present

## 2020-12-27 DIAGNOSIS — I82411 Acute embolism and thrombosis of right femoral vein: Secondary | ICD-10-CM

## 2020-12-27 LAB — BASIC METABOLIC PANEL
BUN: 29 — AB (ref 4–21)
CO2: 28 — AB (ref 13–22)
Chloride: 97 — AB (ref 99–108)
Creatinine: 1.2 (ref 0.6–1.3)
Glucose: 92
Potassium: 4.4 (ref 3.4–5.3)
Sodium: 135 — AB (ref 137–147)

## 2020-12-27 LAB — COMPREHENSIVE METABOLIC PANEL
Albumin: 2.9 — AB (ref 3.5–5.0)
Calcium: 8.4 — AB (ref 8.7–10.7)
Globulin: 2.5

## 2020-12-27 LAB — CBC AND DIFFERENTIAL
HCT: 31 — AB (ref 41–53)
Hemoglobin: 10.1 — AB (ref 13.5–17.5)
Neutrophils Absolute: 5694
Platelets: 283 (ref 150–399)
WBC: 7.8

## 2020-12-27 LAB — CBC: RBC: 3.59 — AB (ref 3.87–5.11)

## 2020-12-27 LAB — HEPATIC FUNCTION PANEL
ALT: 6 — AB (ref 10–40)
AST: 18 (ref 14–40)
Alkaline Phosphatase: 46 (ref 25–125)

## 2020-12-27 LAB — C-REACTIVE PROTEIN: CRP: 73.7

## 2020-12-27 NOTE — Progress Notes (Signed)
Location:   [facility] Room Number: 27 Place of Service:  SNF [phone]) Provider:  Veleta Miners MD   Virgie Dad, MD  Patient Care Team: Virgie Dad, MD as PCP - General (Internal Medicine) [name], Eustace Quail, DO as Consulting Physician (Neurology)  Extended Emergency Contact Information Primary Emergency Contact: Vergia Alcon Address: [place].          apt [phone] Johnnette Litter of [place] Phone: [phone] Mobile Phone: [phone] Relation: Spouse Secondary Emergency Contact: davian, hanshaw Mobile Phone: [phone] Relation: Son  Code Status:  Full Code Goals of care: Advanced Directive information Advanced Directives 12/27/2020  Does Patient Have a Medical Advance Directive? Yes  Type of Advance Directive Living will  Does patient want to make changes to medical advance directive? No - Patient declined  Would patient like information on creating a medical advance directive? -     Chief Complaint  Patient presents with   Acute Visit    Wound    HPI:  Pt is a 87 y.o. male seen today for an acute visit for Wound follow up  Patient has h/o Parkinson disease Follows with Dr [name] Diagnosed in 2019 in [place] Has ,Bradykinesia and Rigidity MRI of head show mild small vessel disease as well as a few old microhemorrhages in the right cerebellum and right cerebral hemisphere.  MRI of the cervical spine that same date demonstrating multilevel degenerative changes, most pronounced at C3-C4.  Also has h/o Osteoporosis , Vit D def Has been on Fosamax before, BPH, Arthritis,    Admitted in Hospital from 7/4-7/8 for Commuted Right Acetabulum Fracture ORIF on 7/06   Was NWB till this week when seen by Dr Marcelino Scot and now Can weight bear  But then Noticed to have DTI which opened up with Odorous Discharge and Necrosis No Pain or Fever No Leucocytosis But does have Edema Bilateral Left more then Right Also diagnosed with DVT  of Right Leg No SOB    Past Medical History:  Diagnosis Date   Acute bronchiolitis    Anemia    Bursitis of both hips    Hearing reduced    hearing aid   History of basal cell carcinoma    Hyperlipidemia    Malaise and fatigue    Osteoarthritis    hand   Osteoporosis    Palpitation    Vitamin D deficiency    Past Surgical History:  Procedure Laterality Date   CATARACT EXTRACTION  05/31/2015   Eye/right   COLONOSCOPY  02/05/2005, 03/06/2005   normal, 10 years ago   MOHS micrographic surgery     Face surgery   ORIF ACETABULAR FRACTURE Right 10/09/2020   Procedure: OPEN REDUCTION INTERNAL FIXATION (ORIF) ACETABULAR FRACTURE;  Surgeon: Altamese [place], MD;  Location: [facility];  Service: Orthopedics;  Laterality: Right;    No Known Allergies  Allergies as of 12/27/2020   No Known Allergies      Medication List        Accurate as of December 27, 2020 11:53 AM. If you have any questions, ask your nurse or doctor.          acetaminophen 325 MG tablet Commonly known as: TYLENOL Take 1-2 tablets (325-650 mg total) by mouth every 6 (six) hours as needed for mild pain (pain score 1-3 or temp > 100.5).   apixaban 5 MG Tabs tablet Commonly known as: Eliquis Take 2 tablets (10 mg total) by mouth  2 (two) times daily for 7 days.   apixaban 5 MG Tabs tablet Commonly known as: ELIQUIS Take 1 tablet (5 mg total) by mouth 2 (two) times daily. Start taking on: December 29, 2020   CALCIUM-VITAMIN D3 PO Take 1 tablet by mouth in the morning and at bedtime.   carbidopa-levodopa 25-100 MG tablet Commonly known as: SINEMET IR Take 1 tablet by mouth 4 (four) times daily.   carbidopa-levodopa 50-200 MG tablet Commonly known as: SINEMET CR Take 1 tablet by mouth at bedtime.   cefTRIAXone 1 g injection Commonly known as: ROCEPHIN Inject into the muscle once.   doxycycline 100 MG capsule Commonly known as: VIBRAMYCIN Take 100 mg by mouth 2 (two) times daily.   polyethylene  glycol 17 g packet Commonly known as: MiraLax Take 17 g by mouth daily.   potassium chloride SA 20 MEQ tablet Commonly known as: KLOR-CON Take 40 mEq by mouth daily.   senna 8.6 MG Tabs tablet Commonly known as: SENOKOT Take 1 tablet (8.6 mg total) by mouth daily.   torsemide 20 MG tablet Commonly known as: DEMADEX Take 20 mg by mouth daily. Take 40 mg for 3 days and then 20 mg QD   zinc oxide 20 % ointment Apply 1 application topically as needed for irritation.        Review of Systems  Constitutional:  Positive for activity change.  HENT: Negative.    Respiratory: Negative.    Cardiovascular:  Positive for leg swelling.  Genitourinary: Negative.   Musculoskeletal:  Positive for gait problem.  Skin:  Positive for wound.  Psychiatric/Behavioral: Negative.     Immunization History  Administered Date(s) Administered   H1N1 06/13/2008   Influenza Split 03/05/2006, 02/26/2007, 01/26/2008, 01/18/2009, 12/12/2009, 01/16/2011, 01/29/2012, 02/01/2013, 02/08/2014, 02/13/2015, 01/10/2016, 02/23/2017, 02/24/2018, 01/24/2019, 12/22/2019   PFIZER(Purple Top)SARS-COV-2 Vaccination 09/01/2020   Pneumococcal Conjugate-13 02/10/2014   Pneumococcal Polysaccharide-23 06/10/2011   Zoster, Live 07/04/2009   Pertinent  Health Maintenance Due  Topic Date Due   INFLUENZA VACCINE  11/05/2020   Fall Risk  09/26/2020  Falls in the past year? 0  Number falls in past yr: 0  Injury with Fall? 0   Functional Status Survey:    Vitals:   12/27/20 1138  BP: 118/66  Pulse: 92  Resp: 20  Temp: (!) 97.4 F (36.3 C)  SpO2: 94%  Weight: 190 lb 6.4 oz (86.4 kg)  Height: '5\' 9"'  (1.753 m)   Body mass index is 28.12 kg/m. Physical Exam Vitals reviewed.  Constitutional:      Appearance: Normal appearance.  HENT:     Head: Normocephalic.     Nose: Nose normal.     Mouth/Throat:     Mouth: Mucous membranes are moist.     Pharynx: Oropharynx is clear.  Eyes:     Pupils: Pupils are equal,  round, and reactive to light.  Cardiovascular:     Rate and Rhythm: Normal rate.     Pulses: Normal pulses.     Heart sounds: Normal heart sounds.  Pulmonary:     Effort: Pulmonary effort is normal.     Breath sounds: Normal breath sounds.  Abdominal:     General: Abdomen is flat. Bowel sounds are normal.     Palpations: Abdomen is soft.  Musculoskeletal:        General: Swelling present.     Cervical back: Neck supple.     Comments: Moderate Swelling Bilateral Left more then Right    Skin:  Comments: Unstageble Left Heel wound 4-5 cm Necrosis is better in the center More Clearer Area. But his skin around the wound is still Macerated. No Pain Odor and Drainage is little better  Neurological:     General: No focal deficit present.     Mental Status: He is alert.  Psychiatric:        Mood and Affect: Mood normal.        Thought Content: Thought content normal.    Labs reviewed: Recent Labs    10/09/20 0546 10/09/20 0937 10/10/20 0607 10/11/20 0037 10/12/20 0146 10/25/20 0000 11/19/20 0000 12/13/20 0000 12/26/20 0000  NA  --    < > 131* 129* 132*   < > 132* 131* 133*  K  --    < > 4.2 4.5 4.1   < > 4.5 4.4 4.9  CL  --    < > 97* 96* 97*   < > 98* 97* 94*  CO2  --    < > '28 29 28   ' < > 27* 26* 31*  GLUCOSE  --    < > 134* 114* 122*  --   --   --   --   BUN  --    < > 23 26* 24*   < > 20 20 28*  CREATININE  --    < > 1.20 1.14 1.14   < > 1.0 1.0 1.2  CALCIUM  --    < > 8.7* 8.6* 8.4*   < > 8.2* 8.8 9.3  MG  --   --  2.1  --   --   --   --   --   --   PHOS 3.4  --   --   --  3.5  --   --   --   --    < > = values in this interval not displayed.   Recent Labs    10/09/20 0937 10/11/20 0037 10/12/20 0146 12/26/20 0000  AST 22 34  --  19  ALT 7 <5  --  9*  ALKPHOS 31* 24*  --  55  BILITOT 1.7* 1.1  --   --   PROT 5.9* 4.6*  --   --   ALBUMIN 3.4* 2.6* 2.6* 3.7   Recent Labs    10/08/20 1722 10/09/20 0211 10/10/20 0607 10/11/20 0037 10/12/20 0146  10/25/20 0000 11/19/20 0000  WBC 11.7*   < > 13.8* 10.1 8.5 7.1 6.2  NEUTROABS 10.2*  --   --   --  6.4 5,013.00  --   HGB 13.2   < > 8.7* 7.4* 7.8* 8.9* 9.9*  HCT 39.5   < > 25.3* 21.8* 22.8* 27* 31*  MCV 97.8   < > 96.2 96.9 96.6  --   --   PLT 160   < > 93* 90* 116* 293 206   < > = values in this interval not displayed.   No results found for: TSH Lab Results  Component Value Date   HGBA1C 5.9 (H) 10/08/2020   No results found for: CHOL, HDL, LDLCALC, LDLDIRECT, TRIG, CHOLHDL  Significant Diagnostic Results in last 30 days:  No results found.  Assessment/Plan Non-healing open wound of left heel, subsequent encounter Has Appointment with Wound care for Next  Fri Will Discontinue Santyl and Continue on Silver Alginate His Heel  Xray is negative for Osteomyelitis But ESR is 58 and CRP is 71 He is on Doxycyline 100 mg BID and  Rocephin  DVT of deep femoral vein, right (HCC) On Eliquis now Peripheral edema Will change his Demadex to 40 mg  Repeat BMP in 1 week  Other issues  Parkinson's disease ([place]) Dr [name] On Sinemet Hypertension, unspecified type Off  Coreg now BP stable on Diuretics Anemia due to blood loss Hgb stable Age-related osteoporosis without current pathological fracture Has taken Fosamax for 7 years  Stopped in 2013 Start on Calcium and Vit D   Family/ staff Communication:   Labs/tests ordered:

## 2021-01-01 ENCOUNTER — Non-Acute Institutional Stay (SKILLED_NURSING_FACILITY): Payer: Medicare Other | Admitting: Orthopedic Surgery

## 2021-01-01 ENCOUNTER — Encounter: Payer: Self-pay | Admitting: Orthopedic Surgery

## 2021-01-01 DIAGNOSIS — S91302D Unspecified open wound, left foot, subsequent encounter: Secondary | ICD-10-CM | POA: Diagnosis not present

## 2021-01-01 DIAGNOSIS — I10 Essential (primary) hypertension: Secondary | ICD-10-CM | POA: Diagnosis not present

## 2021-01-01 DIAGNOSIS — G20 Parkinson's disease: Secondary | ICD-10-CM

## 2021-01-01 DIAGNOSIS — I82411 Acute embolism and thrombosis of right femoral vein: Secondary | ICD-10-CM | POA: Diagnosis not present

## 2021-01-01 DIAGNOSIS — S32411D Displaced fracture of anterior wall of right acetabulum, subsequent encounter for fracture with routine healing: Secondary | ICD-10-CM | POA: Diagnosis not present

## 2021-01-01 DIAGNOSIS — M810 Age-related osteoporosis without current pathological fracture: Secondary | ICD-10-CM

## 2021-01-01 DIAGNOSIS — D500 Iron deficiency anemia secondary to blood loss (chronic): Secondary | ICD-10-CM

## 2021-01-01 MED ORDER — SULFAMETHOXAZOLE-TRIMETHOPRIM 800-160 MG PO TABS: 1.0000 | ORAL_TABLET | Freq: Two times a day (BID) | ORAL | Status: DC

## 2021-01-01 NOTE — Progress Notes (Signed)
Location:   [facility] Room Number: 27-A Place of Service:  SNF [phone]) Provider:  Hazle Nordmann, NP    Patient Care Team: [name], MD as PCP - General (Internal Medicine) [name], Octaviano Batty, DO as Consulting Physician (Neurology)  Extended Emergency Contact Information Primary Emergency Contact: Antionette Fairy Address: [address] [place].          apt [phone] D[place] of [place] Home Phone: [phone] Mobile Phone: [phone] Relation: Spouse Secondary Emergency Contact: harol, shabazz Mobile Phone: [phone] Relation: Son  Code Status:  FULL CODE Goals of care: Advanced Directive information Advanced Directives 01/01/2021  Does Patient Have a Medical Advance Directive? Yes  Type of Advance Directive Living will  Does patient want to make changes to medical advance directive? No - Patient declined  Would patient like information on creating a medical advance directive? -     Chief Complaint  Patient presents with   Acute Visit    Left heel wound.    HPI:  Pt is a 87 y.o. male seen today for an acute visit for left heel wound.   Left heel wound- culture results indicate moderate growth of MRSA. 09/14 he was started on doxycycline 100 mg bid and rocephin x 3 days. Dr. Leanord Hawking consulted, follow up moved up to 09/30, advised starting Santyl to debride wound. 09/16 xray left heel negative for osteomyelitis. Today, he denies pain, chills or fever. Continues to have daily dressing changes with Santyl application.  DVT- 09/16 he was diagnosed with DVT to right femoral vein. High dose Eliquis started x 1 week. Today, right leg swelling has subsided. He denies pain and warmth. Remains on eliquis for dvt prevention.  Closed fracture- continues to work with PT/OT due to right acetabular fracture. Denies pain. No recent falls or injuries.  Parkinsons- followed by Dr. Arbutus Leas, scheduled next week, remains on sinemet, MRI indicated mild small vessel  disease and old microhemorrhages in right cerebellum and right cerebral hemisphere.  HTN- BUN/creat 29/1.23 12/28/2020, controlled with torsemide Anemia- hgb 10.1 12/28/2020 Osteoporosis- on calcium and vitamin D, hx fosamax x 7 years    Past Medical History:  Diagnosis Date   Acute bronchiolitis    Anemia    Bursitis of both hips    Hearing reduced    hearing aid   History of basal cell carcinoma    Hyperlipidemia    Malaise and fatigue    Osteoarthritis    hand   Osteoporosis    Palpitation    Vitamin D deficiency    Past Surgical History:  Procedure Laterality Date   CATARACT EXTRACTION  05/31/2015   Eye/right   COLONOSCOPY  02/05/2005, 03/06/2005   normal, 10 years ago   MOHS micrographic surgery     Face surgery   ORIF ACETABULAR FRACTURE Right 10/09/2020   Procedure: OPEN REDUCTION INTERNAL FIXATION (ORIF) ACETABULAR FRACTURE;  Surgeon: [name], MD;  Location: [facility] OR;  Service: Orthopedics;  Laterality: Right;    No Known Allergies  Allergies as of 01/01/2021   No Known Allergies      Medication List        Accurate as of January 01, 2021 10:26 AM. If you have any questions, ask your nurse or doctor.          acetaminophen 325 MG tablet Commonly known as: TYLENOL Take 1-2 tablets (325-650 mg total) by mouth every 6 (six) hours as needed for mild pain (pain score 1-3 or temp >  100.5).   apixaban 5 MG Tabs tablet Commonly known as: Eliquis Take 2 tablets (10 mg total) by mouth 2 (two) times daily for 7 days.   apixaban 5 MG Tabs tablet Commonly known as: ELIQUIS Take 1 tablet (5 mg total) by mouth 2 (two) times daily.   CALCIUM-VITAMIN D3 PO Take 600 mg by mouth in the morning.   carbidopa-levodopa 25-100 MG tablet Commonly known as: SINEMET IR Take 1 tablet by mouth 4 (four) times daily.   carbidopa-levodopa 50-200 MG tablet Commonly known as: SINEMET CR Take 1 tablet by mouth at bedtime.   doxycycline 100 MG capsule Commonly known  as: VIBRAMYCIN Take 100 mg by mouth 2 (two) times daily.   polyethylene glycol 17 g packet Commonly known as: MiraLax Take 17 g by mouth daily.   potassium chloride SA 20 MEQ tablet Commonly known as: KLOR-CON Take 40 mEq by mouth daily.   senna 8.6 MG Tabs tablet Commonly known as: SENOKOT Take 1 tablet (8.6 mg total) by mouth daily.   torsemide 20 MG tablet Commonly known as: DEMADEX Take 40 mg by mouth daily.   zinc oxide 20 % ointment Apply 1 application topically as needed for irritation.        Review of Systems  Constitutional:  Negative for activity change, appetite change, chills, fatigue and fever.  Respiratory:  Negative for cough, shortness of breath and wheezing.   Cardiovascular:  Positive for leg swelling. Negative for chest pain.  Gastrointestinal:  Negative for abdominal distention, abdominal pain, constipation, diarrhea and nausea.  Genitourinary:  Negative for dysuria, frequency and hematuria.  Musculoskeletal:  Positive for arthralgias, gait problem and myalgias.  Skin:  Positive for wound.       Left heel DTI  Neurological:  Positive for weakness. Negative for dizziness and headaches.  Psychiatric/Behavioral:  Negative for confusion and dysphoric mood. The patient is not nervous/anxious.    Immunization History  Administered Date(s) Administered   H1N1 06/13/2008   Influenza Split 03/05/2006, 02/26/2007, 01/26/2008, 01/18/2009, 12/12/2009, 01/16/2011, 01/29/2012, 02/01/2013, 02/08/2014, 02/13/2015, 01/10/2016, 02/23/2017, 02/24/2018, 01/24/2019, 12/22/2019   PFIZER(Purple Top)SARS-COV-2 Vaccination 09/01/2020   Pneumococcal Conjugate-13 02/10/2014   Pneumococcal Polysaccharide-23 06/10/2011   Zoster, Live 07/04/2009   Pertinent  Health Maintenance Due  Topic Date Due   INFLUENZA VACCINE  11/05/2020   Fall Risk  09/26/2020  Falls in the past year? 0  Number falls in past yr: 0  Injury with Fall? 0   Functional Status Survey:    Vitals:    01/01/21 1001  BP: 118/66  Pulse: 92  Resp: 20  Temp: 98.2 F (36.8 C)  SpO2: 94%  Weight: 178 lb 3.2 oz (80.8 kg)  Height: 5\' 9"  (1.753 m)   Body mass index is 26.32 kg/m. Physical Exam Vitals reviewed.  Constitutional:      General: He is not in acute distress. HENT:     Head: Normocephalic.  Eyes:     General:        Right eye: No discharge.        Left eye: No discharge.  Neck:     Vascular: No carotid bruit.  Cardiovascular:     Rate and Rhythm: Normal rate and regular rhythm.     Pulses: Normal pulses.     Heart sounds: Normal heart sounds. No murmur heard. Pulmonary:     Effort: Pulmonary effort is normal. No respiratory distress.     Breath sounds: Normal breath sounds. No wheezing.  Abdominal:  General: Bowel sounds are normal. There is no distension.     Palpations: Abdomen is soft.     Tenderness: There is no abdominal tenderness.  Musculoskeletal:     Cervical back: Normal range of motion.     Right lower leg: Edema present.     Left lower leg: Edema present.     Comments: No swelling or warmth to upper legs. Mild swelling to left ankle. Dorsal pedis 1+. FROM left ankle.   Lymphadenopathy:     Cervical: No cervical adenopathy.  Skin:    General: Skin is warm and dry.     Capillary Refill: Capillary refill takes less than 2 seconds.     Comments: Left heel DTI, necrotic tissue with odor present, granulation tissue < 5% in wound bed, drainage purulent, surrounding skin intact.   Neurological:     General: No focal deficit present.     Mental Status: He is alert. Mental status is at baseline.     Motor: Weakness present.     Gait: Gait abnormal.     Comments: Stiffness   Psychiatric:        Mood and Affect: Mood normal.        Behavior: Behavior normal.    Labs reviewed: Recent Labs    10/09/20 0546 10/09/20 0937 10/10/20 0607 10/11/20 0037 10/12/20 0146 10/25/20 0000 12/13/20 0000 12/26/20 0000 12/27/20 0000  NA  --    < > 131* 129*  132*   < > 131* 133* 135*  K  --    < > 4.2 4.5 4.1   < > 4.4 4.9 4.4  CL  --    < > 97* 96* 97*   < > 97* 94* 97*  CO2  --    < > 28 29 28    < > 26* 31* 28*  GLUCOSE  --    < > 134* 114* 122*  --   --   --   --   BUN  --    < > 23 26* 24*   < > 20 28* 29*  CREATININE  --    < > 1.20 1.14 1.14   < > 1.0 1.2 1.2  CALCIUM  --    < > 8.7* 8.6* 8.4*   < > 8.8 9.3 8.4*  MG  --   --  2.1  --   --   --   --   --   --   PHOS 3.4  --   --   --  3.5  --   --   --   --    < > = values in this interval not displayed.   Recent Labs    10/09/20 0937 10/11/20 0037 10/12/20 0146 12/26/20 0000 12/27/20 0000  AST 22 34  --  19 18  ALT 7 <5  --  9* 6*  ALKPHOS 31* 24*  --  55 46  BILITOT 1.7* 1.1  --   --   --   PROT 5.9* 4.6*  --   --   --   ALBUMIN 3.4* 2.6* 2.6* 3.7 2.9*   Recent Labs    10/10/20 0607 10/11/20 0037 10/12/20 0146 10/25/20 0000 11/19/20 0000 12/27/20 0000  WBC 13.8* 10.1 8.5 7.1 6.2 7.8  NEUTROABS  --   --  6.4 5,013.00  --  5,694.00  HGB 8.7* 7.4* 7.8* 8.9* 9.9* 10.1*  HCT 25.3* 21.8* 22.8* 27* 31* 31*  MCV 96.2 96.9 96.6  --   --   --  PLT 93* 90* 116* 293 206 283   No results found for: TSH Lab Results  Component Value Date   HGBA1C 5.9 (H) 10/08/2020   No results found for: CHOL, HDL, LDLCALC, LDLDIRECT, TRIG, CHOLHDL  Significant Diagnostic Results in last 30 days:  No results found.  Assessment/Plan: 1. Non-healing open wound of left heel, subsequent encounter - ongoing, wound culture indicated moderate MRSA -WBC 10, sed rate 55, CRP 73.7 12/25/2020 - wound remains necrotic with odor - d/c doxycycline - start Bactrim 800/160 mg po bid - bmp 09/30 in AM - xray left heel- today - f/u with Dr. Leanord Hawking 09/30  2. DVT of deep femoral vein, right (HCC) - diagnosed 09/16 - completed eliquis 10 mg po bid x 7 days - cont eliquis 5 mg po bid - re-evaluate in 3-5 months  3. Closed displaced fracture of anterior wall of right acetabulum with routine  healing, subsequent encounter - resolved - cont PT/OT due to weakness  4. Parkinson's disease (HCC) - followed by Dr. Arbutus Leas - stiffness present today - intermittent confusion past few weeks, appropriate today - cont sinemet  5. Hypertension, unspecified type - controlled - cont diuretics  6. Anemia due to blood loss - hgb 10.1 12/27/2020  7. Age-related osteoporosis without current pathological fracture - DEXA unknown - cont calcium and vit d supplement    Family/ staff Communication: plan discussed with patient, wife, and nurse  Labs/tests ordered:  xray left heel, bmp 09/30

## 2021-01-02 ENCOUNTER — Other Ambulatory Visit: Payer: Self-pay | Admitting: Orthopedic Surgery

## 2021-01-02 DIAGNOSIS — M86172 Other acute osteomyelitis, left ankle and foot: Secondary | ICD-10-CM

## 2021-01-04 ENCOUNTER — Encounter (HOSPITAL_BASED_OUTPATIENT_CLINIC_OR_DEPARTMENT_OTHER): Payer: Medicare Other | Attending: Internal Medicine | Admitting: Internal Medicine

## 2021-01-04 ENCOUNTER — Other Ambulatory Visit: Payer: Self-pay

## 2021-01-04 DIAGNOSIS — L89620 Pressure ulcer of left heel, unstageable: Secondary | ICD-10-CM | POA: Insufficient documentation

## 2021-01-04 DIAGNOSIS — B9562 Methicillin resistant Staphylococcus aureus infection as the cause of diseases classified elsewhere: Secondary | ICD-10-CM | POA: Insufficient documentation

## 2021-01-04 DIAGNOSIS — M86172 Other acute osteomyelitis, left ankle and foot: Secondary | ICD-10-CM | POA: Insufficient documentation

## 2021-01-04 DIAGNOSIS — G20 Parkinson's disease: Secondary | ICD-10-CM | POA: Diagnosis not present

## 2021-01-04 LAB — BASIC METABOLIC PANEL
BUN: 47 — AB (ref 4–21)
CO2: 30 — AB (ref 13–22)
Chloride: 97 — AB (ref 99–108)
Creatinine: 2.1 — AB (ref 0.6–1.3)
Glucose: 117
Potassium: 4.7 (ref 3.4–5.3)
Sodium: 136 — AB (ref 137–147)

## 2021-01-04 LAB — COMPREHENSIVE METABOLIC PANEL: Calcium: 9.1 (ref 8.7–10.7)

## 2021-01-04 NOTE — Progress Notes (Signed)
Greg [name] ([id number]) , Visit Report for 01/04/2021 Abuse/Suicide Risk Screen Details Patient Name: Date of Service: Greg [name] [employer] 01/04/2021 1:15 PM Medical Record Number: [id number] Patient Account Number: [id number] Date of Birth/Sex: Treating RN: 23-May-1931 (87 y.o. Greg [name] Primary Care Inell Mimbs: [name] Other Clinician: Referring Marilu Rylander: Treating Krystyn Picking/Extender: Allen Norris in Treatment: 0 Abuse/Suicide Risk Screen Items Answer ABUSE RISK SCREEN: Has anyone close to you tried to hurt or harm you recentlyo No Do you feel uncomfortable with anyone in your familyo No Has anyone forced you do things that you didnt want to doo No Electronic Signature(s) Signed: 01/04/2021 4:27:12 PM By: Zandra Abts RN, BSN Entered By: Zandra Abts on 01/04/2021 13:47:15 -------------------------------------------------------------------------------- Activities of Daily Living Details Patient Name: Date of Service: Greg [name] [facility] 01/04/2021 1:15 PM Medical Record Number: [id number] Patient Account Number: [id number] Date of Birth/Sex: Treating RN: Dec 01, 1931 (87 y.o. Greg [name] Primary Care Jaston Havens: [name] Other Clinician: Referring Adan Baehr: Treating Xachary Hambly/Extender: Allen Norris in Treatment: 0 Activities of Daily Living Items Answer Activities of Daily Living (Please select one for each item) Drive Automobile Not Able T Medications ake Need Assistance Use T elephone Need Assistance Care for Appearance Need Assistance Use T oilet Need Assistance Bath / Shower Need Assistance Dress Self Need Assistance Feed Self Completely Able Walk Need Assistance Get In / Out Bed Need Assistance Housework Need Assistance Prepare Meals Not Able Handle Money Need Assistance Shop for Self Need Assistance Electronic Signature(s) Signed: 01/04/2021 4:27:12 PM By: Zandra Abts RN, BSN Entered By: Zandra Abts on 01/04/2021 13:47:57 -------------------------------------------------------------------------------- Education Screening Details Patient Name: Date of Service: Greg [name] GER 01/04/2021 1:15 PM Medical Record Number: [id number] Patient Account Number: [id number] Date of Birth/Sex: Treating RN: 04-18-1931 (86 y.o. Greg [name] Primary Care Berlie Hatchel: [name] Other Clinician: Referring Yamil Oelke: Treating Jillianna Stanek/Extender: Allen Norris in Treatment: 0 Primary Learner Assessed: Patient Learning Preferences/Education Level/Primary Language Learning Preference: Explanation, Demonstration, Printed Material Highest Education Level: College or Above Preferred Language: English Cognitive Barrier Language Barrier: No Translator Needed: No Memory Deficit: No Emotional Barrier: No Cultural/Religious Beliefs Affecting Medical Care: No Physical Barrier Impaired Vision: No Impaired Hearing: No Decreased Hand dexterity: No Knowledge/Comprehension Knowledge Level: High Comprehension Level: High Ability to understand written instructions: High Ability to understand verbal instructions: High Motivation Anxiety Level: Calm Cooperation: Cooperative Education Importance: Acknowledges Need Interest in Health Problems: Asks Questions Perception: Coherent Willingness to Engage in Self-Management High Activities: Readiness to Engage in Self-Management High Activities: Electronic Signature(s) Signed: 01/04/2021 4:27:12 PM By: Zandra Abts RN, BSN Entered By: Zandra Abts on 01/04/2021 13:48:18 -------------------------------------------------------------------------------- Fall Risk Assessment Details Patient Name: Date of Service: Greg [name], [name] 01/04/2021 1:15 PM Medical Record Number: [id number] Patient Account Number: [id number] Date of Birth/Sex: Treating RN: Oct 03, 1931 (87 y.o. Greg [name] Primary Care Greg [name]: [name] Other Clinician: Referring Argelio Granier: Treating Sasha Rogel/Extender: Allen Norris in Treatment: 0 Fall Risk Assessment Items Have you had 2 or more falls in the last 12 monthso 0 No Have you had any fall that resulted in injury in the last 12 monthso 0 Yes FALLS RISK SCREEN History of falling - immediate or within 3 months 25 Yes Secondary diagnosis (Do you have 2 or more medical diagnoseso) 15 Yes Ambulatory aid None/bed rest/wheelchair/nurse 0 Yes Crutches/cane/walker 0 No Furniture 0 No Intravenous therapy Access/Saline/Heparin Lock 0 No Gait/Transferring Normal/ bed rest/ wheelchair 0 Yes Weak (short steps  with or without shuffle, stooped but able to lift head while walking, may seek 0 No support from furniture) Impaired (short steps with shuffle, may have difficulty arising from chair, head down, impaired 0 No balance) Mental Status Oriented to own ability 0 Yes Electronic Signature(s) Signed: 01/04/2021 4:27:12 PM By: Zandra Abts RN, BSN Entered By: Zandra Abts on 01/04/2021 13:48:37 -------------------------------------------------------------------------------- Foot Assessment Details Patient Name: Date of Service: Greg [name], [name] 01/04/2021 1:15 PM Medical Record Number: [id number] Patient Account Number: [id number] Date of Birth/Sex: Treating RN: 01-18-1932 (87 y.o. Greg [name] Primary Care Ola Fawver: [name] Other Clinician: Referring Mayline Dragon: Treating Mose Colaizzi/Extender: Allen Norris in Treatment: 0 Foot Assessment Items Site Locations + = Sensation present, - = Sensation absent, C = Callus, U = Ulcer R = Redness, W = Warmth, M = Maceration, PU = Pre-ulcerative lesion F = Fissure, S = Swelling, D = Dryness Assessment Right: Left: Other Deformity: No No Prior Foot Ulcer: No No Prior Amputation: No No Charcot Joint: No No Ambulatory Status: Non-ambulatory Assistance Device:  Wheelchair Gait: Surveyor, mining) Signed: 01/04/2021 4:27:12 PM By: Zandra Abts RN, BSN Entered By: Zandra Abts on 01/04/2021 14:00:03 -------------------------------------------------------------------------------- Nutrition Risk Screening Details Patient Name: Date of Service: Greg [name] GER 01/04/2021 1:15 PM Medical Record Number: [id number] Patient Account Number: [id number] Date of Birth/Sex: Treating RN: 09/22/1931 (87 y.o. Greg [name] Primary Care Jash Wahlen: [name] Other Clinician: Referring Ashtian Villacis: Treating Lenardo Westwood/Extender: George Hugh, Nehemiah Settle in Treatment: 0 Height (in): 73 Weight (lbs): 180 Body Mass Index (BMI): 23.7 Nutrition Risk Screening Items Score Screening NUTRITION RISK SCREEN: I have an illness or condition that made me change the kind and/or amount of food I eat 0 No I eat fewer than two meals per day 0 No I eat few fruits and vegetables, or milk products 0 No I have three or more drinks of beer, liquor or wine almost every day 0 No I have tooth or mouth problems that make it hard for me to eat 0 No I don't always have enough money to buy the food I need 0 No I eat alone most of the time 0 No I take three or more different prescribed or over-the-counter drugs a day 1 Yes Without wanting to, I have lost or gained 10 pounds in the last six months 0 No I am not always physically able to shop, cook and/or feed myself 2 Yes Nutrition Protocols Good Risk Protocol Moderate Risk Protocol 0 Provide education on nutrition High Risk Proctocol Risk Level: Moderate Risk Score: 3 Electronic Signature(s) Signed: 01/04/2021 4:27:12 PM By: Zandra Abts RN, BSN Entered By: Zandra Abts on 01/04/2021 13:52:24

## 2021-01-04 NOTE — Progress Notes (Signed)
REQUAN HARDGE ([id number]) , Visit Report for 01/04/2021 Allergy List Details Patient Name: Date of Service: Fredric Mare [facility] 01/04/2021 1:15 PM Medical Record Number: [id number] Patient Account Number: [id number] Date of Birth/Sex: Treating RN: [birth year] (87 y.o. Elizebeth Koller Primary Care Damari Hiltz: [name] Other Clinician: Referring Leighton Luster: Treating Branda Chaudhary/Extender: George Hugh, Nehemiah Settle in Treatment: 0 Allergies Active Allergies No Known Drug Allergies Allergy Notes Electronic Signature(s) Signed: 01/04/2021 4:27:12 PM By: Zandra Abts RN, BSN Entered By: Zandra Abts on 01/04/2021 13:43:30 -------------------------------------------------------------------------------- Arrival Information Details Patient Name: Date of Service: [name], [name] 01/04/2021 1:15 PM Medical Record Number: [id number] Patient Account Number: [id number] Date of Birth/Sex: Treating RN: 05/09/1931 (87 y.o. Elizebeth Koller Primary Care Berenice Oehlert: [name] Other Clinician: Referring Lawson Isabell: Treating Jospeh Mangel/Extender: Allen Norris in Treatment: 0 Visit Information Patient Arrived: Wheel Chair Arrival Time: 13:33 Accompanied By: wife Transfer Assistance: Manual Patient Identification Verified: Yes Secondary Verification Process Completed: Yes Patient Requires Transmission-Based Precautions: No Patient Has Alerts: Yes Patient Alerts: Patient on Blood Thinner L ABI non compressible Electronic Signature(s) Signed: 01/04/2021 4:27:12 PM By: Zandra Abts RN, BSN Entered By: Zandra Abts on 01/04/2021 13:55:41 -------------------------------------------------------------------------------- Clinic Level of Care Assessment Details Patient Name: Date of Service: [name] 01/04/2021 1:15 PM Medical Record Number: [id number] Patient Account Number: [id number] Date of Birth/Sex: Treating RN: 12/26/1931 (87 y.o. Elizebeth Koller Primary  Care Meridith Romick: [name] Other Clinician: Referring Terrall Bley: Treating Leeyah Heather/Extender: Allen Norris in Treatment: 0 Clinic Level of Care Assessment Items TOOL 1 Quantity Score X- 1 0 Use when EandM and Procedure is performed on INITIAL visit ASSESSMENTS - Nursing Assessment / Reassessment X- 1 20 General Physical Exam (combine w/ comprehensive assessment (listed just below) when performed on new pt. evals) X- 1 25 Comprehensive Assessment (HX, ROS, Risk Assessments, Wounds Hx, etc.) ASSESSMENTS - Wound and Skin Assessment / Reassessment []  - 0 Dermatologic / Skin Assessment (not related to wound area) ASSESSMENTS - Ostomy and/or Continence Assessment and Care []  - 0 Incontinence Assessment and Management []  - 0 Ostomy Care Assessment and Management (repouching, etc.) PROCESS - Coordination of Care X - Simple Patient / Family Education for ongoing care 1 15 []  - 0 Complex (extensive) Patient / Family Education for ongoing care X- 1 10 Staff obtains , Records, T Results / Process Orders est X- 1 10 Staff telephones HHA, Nursing Homes / Clarify orders / etc []  - 0 Routine Transfer to another Facility (non-emergent condition) []  - 0 Routine Hospital Admission (non-emergent condition) X- 1 15 New Admissions / / Ordering NPWT Apligraf, etc. , []  - 0 Emergency Hospital Admission (emergent condition) PROCESS - Special Needs []  - 0 Pediatric / Minor Patient Management []  - 0 Isolation Patient Management []  - 0 Hearing / Language / Visual special needs []  - 0 Assessment of Community assistance (transportation, D/C planning, etc.) []  - 0 Additional assistance / Altered mentation []  - 0 Support Surface(s) Assessment (bed, cushion, seat, etc.) INTERVENTIONS - Miscellaneous []  - 0 External ear exam []  - 0 Patient Transfer (multiple staff / / Similar devices) []  - 0 Simple Staple / Suture removal (25  or less) []  - 0 Complex Staple / Suture removal (26 or more) []  - 0 Hypo/Hyperglycemic Management (do not check if billed separately) X- 1 15 Ankle / Brachial Index (ABI) - do not check if billed separately Has the patient been seen at the hospital within the last  three years: Yes Total Score: 110 Level Of Care: New/Established - Level 3 Electronic Signature(s) Signed: 01/04/2021 4:27:12 PM By: Zandra Abts RN, BSN Entered By: Zandra Abts on 01/04/2021 16:05:48 -------------------------------------------------------------------------------- Encounter Discharge Information Details Patient Name: Date of Service: [name], [name] 01/04/2021 1:15 PM Medical Record Number: [id number] Patient Account Number: [id number] Date of Birth/Sex: Treating RN: [birth year] (87 y.o. Elizebeth Koller Primary Care Alira Fretwell: [name] Other Clinician: Referring Margean Korell: Treating Jannelle Notaro/Extender: Allen Norris in Treatment: 0 Encounter Discharge Information Items Post Procedure Vitals Discharge Condition: Stable Temperature (F): 98.1 Ambulatory Status: Wheelchair Pulse (bpm): 81 Discharge Destination: Home Respiratory Rate (breaths/min): 16 Transportation: Private Auto Blood Pressure (mmHg): 112/72 Accompanied By: wife Schedule Follow-up Appointment: Yes Clinical Summary of Care: Patient Declined Electronic Signature(s) Signed: 01/04/2021 4:27:12 PM By: Zandra Abts RN, BSN Entered By: Zandra Abts on 01/04/2021 16:06:48 -------------------------------------------------------------------------------- Lower Extremity Assessment Details Patient Name: Date of Service: [name] 01/04/2021 1:15 PM Medical Record Number: [id number] Patient Account Number: [id number] Date of Birth/Sex: Treating RN: [birth year] (87 y.o. Elizebeth Koller Primary Care Shaunice Levitan: [name] Other Clinician: Referring Klark Vanderhoef: Treating Gaylynn Seiple/Extender: Allen Norris in Treatment: 0 Edema Assessment Assessed: Kyra Searles: No] Franne Forts: No] E[Left: dema] [Right: :] Calf Left: Right: Point of Measurement: 36 cm From Medial Instep 39 cm Ankle Left: Right: Point of Measurement: 11 cm From Medial Instep 27 cm Knee To Floor Left: Right: From Medial Instep 51 cm Vascular Assessment Pulses: Dorsalis Pedis Palpable: [Left:Yes] Electronic Signature(s) Signed: 01/04/2021 4:27:12 PM By: Zandra Abts RN, BSN Entered By: Zandra Abts on 01/04/2021 13:53:49 -------------------------------------------------------------------------------- Multi Wound Chart Details Patient Name: Date of Service: [name], [name] 01/04/2021 1:15 PM Medical Record Number: [id number] Patient Account Number: [id number] Date of Birth/Sex: Treating RN: [birth year] (87 y.o. M) Primary Care Kasy Iannacone: [name] Other Clinician: Referring Bexley Laubach: Treating Steffan Caniglia/Extender: George Hugh, Nehemiah Settle in Treatment: 0 Vital Signs Height(in): 73 Pulse(bpm): 81 Weight(lbs): 180 Blood Pressure(mmHg): 112/72 Body Mass Index(BMI): 24 Temperature(F): 98.6 Respiratory Rate(breaths/min): 16 Photos: [N/A:N/A] Left Calcaneus N/A N/A Wound Location: Blister N/A N/A Wounding Event: Pressure Ulcer N/A N/A Primary Etiology: Hypertension, Osteoarthritis N/A N/A Comorbid History: 10/24/2020 N/A N/A Date Acquired: 0 N/A N/A Weeks of Treatment: Open N/A N/A Wound Status: 6x10x0.1 N/A N/A Measurements L x W x D (cm) 47.124 N/A N/A A (cm) : rea 4.712 N/A N/A Volume (cm) : Unstageable/Unclassified N/A N/A Classification: Medium N/A N/A Exudate A mount: Serosanguineous N/A N/A Exudate Type: red, brown N/A N/A Exudate Color: Yes N/A N/A Foul Odor A Cleansing: fter No N/A N/A Odor A nticipated Due to Product Use: Distinct, outline attached N/A N/A Wound Margin: N/A N/A N/A Necrotic A mount: Eschar, Adherent Slough N/A  N/A Necrotic Tissue: Fascia: No N/A N/A Exposed Structures: Fat Layer (Subcutaneous Tissue): No Tendon: No Muscle: No Joint: No Bone: No None N/A N/A Epithelialization: Debridement - Excisional N/A N/A Debridement: Pre-procedure Verification/Time Out 14:08 N/A N/A Taken: Necrotic/Eschar, Subcutaneous, N/A N/A Tissue Debrided: Slough Skin/Subcutaneous Tissue N/A N/A Level: 48 N/A N/A Debridement A (sq cm): rea Blade, Curette, Forceps N/A N/A Instrument: Moderate N/A N/A Bleeding: Pressure N/A N/A Hemostasis Achieved: 0 N/A N/A Procedural Pain: 0 N/A N/A Post Procedural Pain: Debridement Treatment Response: Procedure was tolerated well N/A N/A Post Debridement Measurements L x 6x10x0.1 N/A N/A W x D (cm) 4.712 N/A N/A Post Debridement Volume: (cm) Unstageable/Unclassified N/A N/A Post Debridement Stage: Debridement N/A N/A Procedures Performed: Treatment Notes Electronic Signature(s)  Signed: 01/04/2021 3:46:44 PM By: Baltazar Najjar MD Entered By: Baltazar Najjar on 01/04/2021 14:30:27 -------------------------------------------------------------------------------- Multi-Disciplinary Care Plan Details Patient Name: Date of Service: [name] 01/04/2021 1:15 PM Medical Record Number: [id number] Patient Account Number: [id number] Date of Birth/Sex: Treating RN: 08-Aug-1931 (87 y.o. Daphane Shepherd, Emeterio Reeve Primary Care Sallyann Kinnaird: [name] Other Clinician: Referring Solomon Skowronek: Treating Rainelle Sulewski/Extender: Allen Norris in Treatment: 0 Multidisciplinary Care Plan reviewed with physician Active Inactive Abuse / Safety / Falls / Self Care Management Nursing Diagnoses: History of Falls Potential for injury related to falls Goals: Patient will not experience any injury related to falls Date Initiated: 01/04/2021 Target Resolution Date: 02/01/2021 Goal Status: Active Patient/caregiver will verbalize/demonstrate measures taken to prevent  injury and/or falls Date Initiated: 01/04/2021 Target Resolution Date: 02/01/2021 Goal Status: Active Interventions: Assess Activities of Daily Living upon admission and as needed Assess fall risk on admission and as needed Assess: immobility, friction, shearing, incontinence upon admission and as needed Assess impairment of mobility on admission and as needed per policy Assess personal safety and home safety (as indicated) on admission and as needed Assess self care needs on admission and as needed Provide education on fall prevention Provide education on personal and home safety Notes: Pressure Nursing Diagnoses: Knowledge deficit related to causes and risk factors for pressure ulcer development Knowledge deficit related to management of pressures ulcers Potential for impaired tissue integrity related to pressure, friction, moisture, and shear Goals: Patient/caregiver will verbalize risk factors for pressure ulcer development Date Initiated: 01/04/2021 Target Resolution Date: 02/01/2021 Goal Status: Active Patient/caregiver will verbalize understanding of pressure ulcer management Date Initiated: 01/04/2021 Target Resolution Date: 02/01/2021 Goal Status: Active Interventions: Assess: immobility, friction, shearing, incontinence upon admission and as needed Assess offloading mechanisms upon admission and as needed Assess potential for pressure ulcer upon admission and as needed Notes: Wound/Skin Impairment Nursing Diagnoses: Impaired tissue integrity Knowledge deficit related to ulceration/compromised skin integrity Goals: Patient/caregiver will verbalize understanding of skin care regimen Date Initiated: 01/04/2021 Target Resolution Date: 02/01/2021 Goal Status: Active Interventions: Assess patient/caregiver ability to obtain necessary supplies Assess patient/caregiver ability to perform ulcer/skin care regimen upon admission and as needed Assess ulceration(s) every  visit Provide education on ulcer and skin care Notes: Electronic Signature(s) Signed: 01/04/2021 4:27:12 PM By: Zandra Abts RN, BSN Entered By: Zandra Abts on 01/04/2021 14:09:14 -------------------------------------------------------------------------------- Pain Assessment Details Patient Name: Date of Service: [name], [name] 01/04/2021 1:15 PM Medical Record Number: [id number] Patient Account Number: [id number] Date of Birth/Sex: Treating RN: Sep 22, 1931 (87 y.o. Elizebeth Koller Primary Care Kaloni Bisaillon: [name] Other Clinician: Referring Shavawn Stobaugh: Treating Elner Seifert/Extender: Allen Norris in Treatment: 0 Active Problems Location of Pain Severity and Description of Pain Patient Has Paino No Site Locations Pain Management and Medication Current Pain Management: Electronic Signature(s) Signed: 01/04/2021 4:27:12 PM By: Zandra Abts RN, BSN Entered By: Zandra Abts on 01/04/2021 13:54:19 -------------------------------------------------------------------------------- Patient/Caregiver Education Details Patient Name: Date of Service: [name], [name] 9/30/2022andnbsp1:15 PM Medical Record Number: [id number] Patient Account Number: [id number] Date of Birth/Gender: Treating RN: 24-Feb-1932 (87 y.o. Elizebeth Koller Primary Care Physician: [name] Other Clinician: Referring Physician: Treating Physician/Extender: Allen Norris in Treatment: 0 Education Assessment Education Provided To: Patient Education Topics Provided Pressure: Methods: Explain/Verbal, Printed Responses: State content correctly Wound/Skin Impairment: Methods: Explain/Verbal Responses: State content correctly Electronic Signature(s) Signed: 01/04/2021 4:27:12 PM By: Zandra Abts RN, BSN Entered By: Zandra Abts on 01/04/2021 14:10:09 -------------------------------------------------------------------------------- Wound Assessment  Details Patient Name:  Date of Service: Fredric Mare [facility] 01/04/2021 1:15 PM Medical Record Number: [id number] Patient Account Number: [id number] Date of Birth/Sex: Treating RN: [birth year] (87 y.o. Elizebeth Koller Primary Care Jibreel Fedewa: [name] Other Clinician: Referring Morrigan Wickens: Treating Tanetta Fuhriman/Extender: Allen Norris in Treatment: 0 Wound Status Wound Number: 1 Primary Etiology: Pressure Ulcer Wound Location: Left Calcaneus Wound Status: Open Wounding Event: Blister Comorbid History: Hypertension, Osteoarthritis Date Acquired: 10/24/2020 Weeks Of Treatment: 0 Clustered Wound: No Photos Wound Measurements Length: (cm) 6 Width: (cm) 10 Depth: (cm) 0.1 Area: (cm) 47.124 Volume: (cm) 4.712 % Reduction in Area: % Reduction in Volume: Epithelialization: None Tunneling: No Undermining: No Wound Description Classification: Unstageable/Unclassified Wound Margin: Distinct, outline attached Exudate Amount: Medium Exudate Type: Serosanguineous Exudate Color: red, brown Foul Odor After Cleansing: Yes Due to Product Use: No Slough/Fibrino Yes Wound Bed Necrotic Amount: Large (67-100%) Exposed Structure Necrotic Quality: Eschar, Adherent Slough Fascia Exposed: No Fat Layer (Subcutaneous Tissue) Exposed: No Tendon Exposed: No Muscle Exposed: No Joint Exposed: No Bone Exposed: No Treatment Notes Wound #1 (Calcaneus) Wound Laterality: Left Cleanser Wound Cleanser Discharge Instruction: Cleanse the wound with wound cleanser prior to applying a clean dressing using gauze sponges, not tissue or cotton balls. Peri-Wound Care Topical Primary Dressing KerraCel Ag Gelling Fiber Dressing, 4x5 in (silver alginate) Discharge Instruction: Apply silver alginate to wound bed, over Santyl Santyl Ointment Discharge Instruction: Apply nickel thick amount to wound bed, under silver alginate Secondary Dressing Woven Gauze Sponge, Non-Sterile 4x4  in Discharge Instruction: Apply over primary dressing as directed. ALLEVYN Heel 4 1/2in x 5 1/2in / 10.5cm x 13.5cm Discharge Instruction: Apply over primary dressing as directed. Secured With [employer], 4.5x3.1 (in/yd) Discharge Instruction: Secure with Kerlix as directed. Paper Tape, 2x10 (in/yd) Discharge Instruction: Secure dressing with tape as directed. Compression Wrap Compression Stockings Add-Ons Electronic Signature(s) Signed: 01/04/2021 4:27:12 PM By: Zandra Abts RN, BSN Signed: 01/04/2021 4:27:12 PM By: Zandra Abts RN, BSN Entered By: Zandra Abts on 01/04/2021 13:50:36 -------------------------------------------------------------------------------- Vitals Details Patient Name: Date of Service: [name], [name] 01/04/2021 1:15 PM Medical Record Number: [id number] Patient Account Number: [id number] Date of Birth/Sex: Treating RN: May 17, 1931 (87 y.o. Elizebeth Koller Primary Care Katya Rolston: [name] Other Clinician: Referring Nello Corro: Treating Jayvion Stefanski/Extender: Allen Norris in Treatment: 0 Vital Signs Time Taken: 13:38 Temperature (F): 98.6 Height (in): 73 Pulse (bpm): 81 Source: Stated Respiratory Rate (breaths/min): 16 Weight (lbs): 180 Blood Pressure (mmHg): 112/72 Source: Stated Reference Range: 80 - 120 mg / dl Body Mass Index (BMI): 23.7 Electronic Signature(s) Signed: 01/04/2021 4:27:12 PM By: Zandra Abts RN, BSN Entered By: Zandra Abts on 01/04/2021 13:43:09

## 2021-01-08 ENCOUNTER — Encounter: Payer: Self-pay | Admitting: Nurse Practitioner

## 2021-01-08 DIAGNOSIS — N183 Chronic kidney disease, stage 3 unspecified: Secondary | ICD-10-CM | POA: Insufficient documentation

## 2021-01-08 LAB — BASIC METABOLIC PANEL
BUN: 35 — AB (ref 4–21)
CO2: 24 — AB (ref 13–22)
Chloride: 104 (ref 99–108)
Creatinine: 1.4 — AB (ref 0.6–1.3)
Glucose: 94
Potassium: 4.8 (ref 3.4–5.3)
Sodium: 136 — AB (ref 137–147)

## 2021-01-08 LAB — COMPREHENSIVE METABOLIC PANEL: Calcium: 8.6 — AB (ref 8.7–10.7)

## 2021-01-08 NOTE — Progress Notes (Signed)
Assessment/Plan:   1.  Parkinsons Disease -Carbidopa/levodopa 25/100, 1 tab at 8 AM/11 AM/2 PM/5 PM -Continue carbidopa/levodopa 50/200 CR at bedtime (added last visit)  2.  Recent DVT  -On Eliquis.  Will need to watch gait/falls.  Discussed this with patient, wife, son.  Needs to be with someone at all times if walking.  3.  RBD  -He is using bed rails  -If something else is needed, can try melatonin at bedtime.  4.  Hallucinations  -Resolving.  Likely most secondary to recent hospitalizations, anesthesia, wound infection (currently with MRSA).  Discussed Nuplazid but we decided to hold on that for now.  Subjective:   Greg [name] was seen today in follow up for Parkinsons disease  My previous records were reviewed prior to todays visit as well as outside records available to me.  Patient's wife is in the room and son is on the phone/FaceTime.  We added bedtime levodopa last visit and slightly increased daytime levodopa, and discussed he may need more this time.  Today, reports that he did really well until he fell.  Unfortunately, the patient did fall in July.  Was apparently helping his wife (reaching for a towel) and fell backwards and hit the head (they report didn't really hit head).  He sustained a right acetabular fracture and on July 5, underwent ORIF with orthopedics.  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  He is now on Eliquis.  He is dealing with a sore on the heel.  Pt states that he walks only with PT now and has only accomplished this 3 times (the 3rd time being yesterday).  This is some because of the wound on the heel.  He had "terrible" hallucinations in the hospital and pt thought that it was associated with pain meds.  That is still there a little - "I may see a small camel."  It often happens after a nap and may happen at meal time but there is some perception that it isn't real.  There is active dreams.  These are somewhat  better.  He sleeps with bed rails.  Current prescribed movement disorder medications: Carbidopa/levodopa 25/100, 1 tab at 8 AM/11 AM/2 PM/5 PM (increased last visit) Carbidopa/levodopa 50/200 CR at bedtime (added last visit)   ALLERGIES:  No Known Allergies  CURRENT MEDICATIONS:  Outpatient Encounter Medications as of 01/10/2021  Medication Sig   acetaminophen (TYLENOL) 325 MG tablet Take 1-2 tablets (325-650 mg total) by mouth every 6 (six) hours as needed for mild pain (pain score 1-3 or temp > 100.5).   apixaban (ELIQUIS) 5 MG TABS tablet Take 1 tablet (5 mg total) by mouth 2 (two) times daily.   Calcium Carbonate-Vitamin D (CALCIUM-VITAMIN D3 PO) Take 600 mg by mouth in the morning.   carbidopa-levodopa (SINEMET CR) 50-200 MG tablet Take 1 tablet by mouth at bedtime.   carbidopa-levodopa (SINEMET IR) 25-100 MG tablet Take 1 tablet by mouth 4 (four) times daily.   polyethylene glycol (MIRALAX) 17 g packet Take 17 g by mouth daily.   potassium chloride SA (KLOR-CON) 20 MEQ tablet Take 40 mEq by mouth daily.   senna (SENOKOT) 8.6 MG TABS tablet Take 1 tablet (8.6 mg total) by mouth daily.   sulfamethoxazole-trimethoprim (BACTRIM DS) 800-160 MG tablet Take 1 tablet by mouth 2 (two) times daily.   torsemide (DEMADEX) 20 MG tablet Take 40 mg by mouth daily.   zinc oxide 20 %  ointment Apply 1 application topically as needed for irritation.   apixaban (ELIQUIS) 5 MG TABS tablet Take 2 tablets (10 mg total) by mouth 2 (two) times daily for 7 days.   [EXPIRED] doxycycline (VIBRAMYCIN) 100 MG capsule Take 100 mg by mouth 2 (two) times daily.   No facility-administered encounter medications on file as of 01/10/2021.    Objective:   PHYSICAL EXAMINATION:    VITALS:   Vitals:   01/10/21 1050  BP: (!) 119/58  Pulse: 72  SpO2: 99%  Weight: 181 lb 8 oz (82.3 kg)    GEN:  The patient appears stated age and is in NAD. HEENT:  Normocephalic, atraumatic.  The mucous membranes are moist. The  superficial temporal arteries are without ropiness or tenderness.   Neurological examination:  Orientation: The patient is alert and oriented x3.  He looks to wife and son for finer aspects of the history, but actually is able to provide most of it quite well. Cranial nerves: There is good facial symmetry with facial hypomimia. The speech is fluent and clear. Soft palate rises symmetrically and there is no tongue deviation. Hearing is intact to conversational tone. Sensation: Sensation is intact to light touch throughout Motor: Strength is at least antigravity x4.  Movement examination: Tone: There is fairly normal tone today. Abnormal movements: there is no significant tremor today. Coordination:  There is decremation with RAM's, only with hand opening and closing and toe taps.   Gait and Station: Not tested as the patient really does not walking right now due to a heel/wound infection.  I have reviewed and interpreted the following labs independently    Chemistry      Component Value Date/Time   NA 135 (A) 12/27/2020 0000   K 4.4 12/27/2020 0000   CL 97 (A) 12/27/2020 0000   CO2 28 (A) 12/27/2020 0000   BUN 29 (A) 12/27/2020 0000   CREATININE 1.2 12/27/2020 0000   CREATININE 1.14 10/12/2020 0146   GLU 92 12/27/2020 0000      Component Value Date/Time   CALCIUM 8.4 (A) 12/27/2020 0000   ALKPHOS 46 12/27/2020 0000   AST 18 12/27/2020 0000   ALT 6 (A) 12/27/2020 0000   BILITOT 1.1 10/11/2020 0037       Lab Results  Component Value Date   WBC 7.8 12/27/2020   HGB 10.1 (A) 12/27/2020   HCT 31 (A) 12/27/2020   MCV 96.6 10/12/2020   PLT 283 12/27/2020    No results found for: TSH   Total time spent on today's visit was , including both face-to-face time and nonface-to-face time.  Time included that spent on review of records (prior notes available to me/labs/imaging if pertinent), discussing treatment and goals, answering patient's questions and coordinating  care.  Cc:  [name], MD

## 2021-01-09 NOTE — Progress Notes (Signed)
Greg [name] ([id number]) , Visit Report for 01/04/2021 Chief Complaint Document Details Patient Name: Date of Service: Greg [name] [name] 01/04/2021 1:15 PM Medical Record Number: [id number] Patient Account Number: [id number] Date of Birth/Sex: Treating RN: Oct 15, 1931 (87 y.o. M) Primary Care Provider: Einar [name] Other Clinician: Referring Provider: Treating Provider/Extender: Greg [name], Greg [name] in Treatment: 0 Information Obtained from: Patient Chief Complaint 01/04/2021; patient is here for review of a unstageable pressure ulcer on the left heel Electronic Signature(s) Signed: 01/04/2021 3:46:44 PM By: Greg Najjar MD Entered By: Greg [name] on 01/04/2021 14:30:59 -------------------------------------------------------------------------------- Debridement Details Patient Name: Date of Service: Greg [name] 01/04/2021 1:15 PM Medical Record Number: [id number] Patient Account Number: [id number] Date of Birth/Sex: Treating RN: 1932/03/17 (87 y.o. M) Primary Care Provider: Einar [name] Other Clinician: Referring Provider: Treating Provider/Extender: Greg [name] in Treatment: 0 Debridement Performed for Assessment: Wound #1 Left Calcaneus Performed By: Physician Greg Caul., MD Debridement Type: Debridement Level of Consciousness (Pre-procedure): Awake and Alert Pre-procedure Verification/Time Out Yes - 14:08 Taken: Start Time: 14:08 T Area Debrided (L x W): otal 6 (cm) x 8 (cm) = 48 (cm) Tissue and other material debrided: Viable, Non-Viable, Eschar, Slough, Subcutaneous, Slough Level: Skin/Subcutaneous Tissue Debridement Description: Excisional Instrument: Blade, Curette, Forceps Bleeding: Moderate Hemostasis Achieved: Pressure End Time: 14:11 Procedural Pain: 0 Post Procedural Pain: 0 Response to Treatment: Procedure was tolerated well Level of Consciousness (Post- Awake and Alert procedure): Post Debridement  Measurements of Total Wound Length: (cm) 6 Stage: Unstageable/Unclassified Width: (cm) 10 Depth: (cm) 0.1 Volume: (cm) 4.712 Character of Wound/Ulcer Post Debridement: Requires Further Debridement Post Procedure Diagnosis Same as Pre-procedure Electronic Signature(s) Signed: 01/04/2021 3:46:44 PM By: Greg Najjar MD Entered By: Greg [name] on 01/04/2021 14:30:40 -------------------------------------------------------------------------------- HPI Details Patient Name: Date of Service: Greg [name] 01/04/2021 1:15 PM Medical Record Number: [id number] Patient Account Number: [id number] Date of Birth/Sex: Treating RN: Feb 20, 1932 (87 y.o. M) Primary Care Provider: Einar [name] Other Clinician: Referring Provider: Treating Provider/Extender: Greg [name] in Treatment: 0 History of Present Illness HPI Description: ADMISSION 01/04/2021 Greg [name] is a pleasant 56 year old woman who lives at [facility] [place] in the skilled facility. He is accompanied by his wife. I did previously been notified by Dr. Chales Abrahams primary physician about this patient. He apparently fractured his right acetabulum in July. Sometime in the recovery of this developed a pressure ulcer on the right heel. When I was first contacted by Dr. Chales Abrahams this was described as a necrotic wound and indeed this is an accurate description. Unfortunately cultures of this have shown MRSA and after 1 negative x-ray a subsequent x-ray on 01/02/2021 showed changes of osteomyelitis. The patient was started on IV vancomycin at the facility and I believe is also on oral trimethoprim/sulfamethoxazole. They are using silver alginate which was done at my suggestion. Sedimentation rate and the said facility was 55 CRP P was also elevated at 73.7 compatible with his osteomyelitis Past medical history includes a right acetabular fracture, DVT in the right femoral vein also in September on Eliquis, fairly severe  Parkinson's disease. He has had [facility] [place] in the skilled facility. ABI in our clinic was noncompressible on the left Electronic Signature(s) Signed: 01/04/2021 3:46:44 PM By: Greg Najjar MD Entered By: Greg [name] on 01/04/2021 14:35:08 -------------------------------------------------------------------------------- Physical Exam Details Patient Name: Date of Service: Greg [name] [name] 01/04/2021 1:15 PM Medical Record Number: [id number] Patient Account Number: [id number] Date of Birth/Sex: Treating RN: February 09, 1932 (  87 y.o. M) Primary Care Provider: Einar [name] Other Clinician: Referring Provider: Treating Provider/Extender: Greg [name] in Treatment: 0 Constitutional Sitting or standing Blood Pressure is within target range for patient.. Pulse regular and within target range for patient.Marland Kitchen Respirations regular, non-labored and within target range.. Temperature is normal and within the target range for the patient.Marland Kitchen Appears in no distress. Respiratory work of breathing is normal. Cardiovascular Dorsalis pedis pulse on the left is present but very faint. I could not feel the posterior tibial. Popliteal pulse is palpable.. Neurological Fairly significant Parkinson's disease. Notes Wound exam; left heel involving the tip and posterior surface. Completely necrotic surface and malodorous. I used a #15 scalpel and pickups to remove necrotic eschar and some necrotic subcutaneous tissue. I then switched to a #5 curette. The area area area was vigorously then washed with gauze and wound cleanser. This does not probe to bone but I am not sure I see a viable surface yet either. There is no surrounding erythema. The patient did not experience much pain Electronic Signature(s) Signed: 01/04/2021 3:46:44 PM By: Greg Najjar MD Entered By: Greg [name] on 01/04/2021  14:34:34 -------------------------------------------------------------------------------- Physician Orders Details Patient Name: Date of Service: Greg [name] [name] 01/04/2021 1:15 PM Medical Record Number: [id number] Patient Account Number: [id number] Date of Birth/Sex: Treating RN: 10/22/1931 (87 y.o. Elizebeth Koller Primary Care Provider: Einar [name] Other Clinician: Referring Provider: Treating Provider/Extender: Greg [name] in Treatment: 0 Verbal / Phone Orders: No Diagnosis Coding Follow-up Appointments ppointment in 2 weeks. - with Dr. Leanord Hawking Tuesday 10/11 or Wednesday 10/12 Return A Bathing/ Shower/ Hygiene May shower with protection but do not get wound dressing(s) wet. Edema Control - Lymphedema / SCD / Other Elevate legs to the level of the heart or above for 30 minutes daily and/or when sitting, a frequency of: - throughout the day Off-Loading Turn and reposition every 2 hours Other: - Float heels off of bed/chair with pillow under calves Wound Treatment Wound #1 - Calcaneus Wound Laterality: Left Cleanser: Wound Cleanser 1 x Per Day/15 Days Discharge Instructions: Cleanse the wound with wound cleanser prior to applying a clean dressing using gauze sponges, not tissue or cotton balls. Prim Dressing: KerraCel Ag Gelling Fiber Dressing, 4x5 in (silver alginate) 1 x Per Day/15 Days ary Discharge Instructions: Apply silver alginate to wound bed, over Santyl Prim Dressing: Santyl Ointment 1 x Per Day/15 Days ary Discharge Instructions: Apply nickel thick amount to wound bed, under silver alginate Secondary Dressing: Woven Gauze Sponge, Non-Sterile 4x4 in 1 x Per Day/15 Days Discharge Instructions: Apply over primary dressing as directed. Secondary Dressing: ALLEVYN Heel 4 1/2in x 5 1/2in / 10.5cm x 13.5cm 1 x Per Day/15 Days Discharge Instructions: Apply over primary dressing as directed. Secured With: [employer], 4.5x3.1 (in/yd) 1 x  Per Day/15 Days Discharge Instructions: Secure with Kerlix as directed. Secured With: Paper Tape, 2x10 (in/yd) 1 x Per Day/15 Days Discharge Instructions: Secure dressing with tape as directed. Services and Therapies rterial Studies- Bilateral w/ABIs, TBIs, and arterial dopplers - Non healing wound left heel A Electronic Signature(s) Signed: 01/04/2021 3:46:44 PM By: Greg Najjar MD Signed: 01/04/2021 4:27:12 PM By: Zandra Abts RN, BSN Entered By: Zandra Abts on 01/04/2021 14:22:55 Prescription 01/04/2021 Jackelyn Poling -------------------------------------------------------------------------------- , Greg Najjar MD Patient Name: Provider: Sep 04, 1931 [id number] Date of Birth: NPI#Judie Petit [id number] Sex: DEA #: [id number] Phone #: License #: Eligha Bridegroom [facility] Patient Address: [address] [place], [place] [address] [address] [place], [place] [address] [phone] Allergies No Known Drug Allergies Provider's Orders rterial Studies- Bilateral w/ABIs, TBIs, and arterial dopplers - Non healing wound left heel A Hand Signature: Date(s): Electronic Signature(s) Signed: 01/04/2021 3:46:44 PM By: Greg Najjar MD Signed: 01/04/2021 4:27:12 PM By: Zandra Abts RN, BSN Entered By: Zandra Abts on 01/04/2021 14:22:55 -------------------------------------------------------------------------------- Problem List Details Patient Name: Date of Service: Greg [name] [name] 01/04/2021 1:15 PM Medical Record Number: [id number] Patient Account Number: [id number] Date of Birth/Sex: Treating RN: March 07, 1932 (87 y.o. M) Primary Care Provider: Einar [name] Other Clinician: Referring Provider: Treating Provider/Extender: Greg [name], Greg [name] in Treatment: 0 Active Problems ICD-10 Encounter Code Description Active Date MDM Diagnosis L89.620 Pressure ulcer of left heel, unstageable 01/04/2021 No  Yes M86.172 Other acute osteomyelitis, left ankle and foot 01/04/2021 No Yes B95.62 Methicillin resistant Staphylococcus aureus infection as the cause of diseases 01/04/2021 No Yes classified elsewhere Inactive Problems Resolved Problems Electronic Signature(s) Signed: 01/04/2021 3:46:44 PM By: Greg Najjar MD Entered By: Greg [name] on 01/04/2021 14:26:16 -------------------------------------------------------------------------------- Progress Note Details Patient Name: Date of Service: Greg [name] 01/04/2021 1:15 PM Medical Record Number: [id number] Patient Account Number: [id number] Date of Birth/Sex: Treating RN: 1931/06/29 (87 y.o. M) Primary Care Provider: Einar [name] Other Clinician: Referring Provider: Treating Provider/Extender: Greg [name], Greg [name] in Treatment: 0 Subjective Chief Complaint Information obtained from Patient 01/04/2021; patient is here for review of a unstageable pressure ulcer on the left heel History of Present Illness (HPI) ADMISSION 01/04/2021 Greg [name] is a pleasant 4 year old woman who lives at [facility] [place] in the skilled facility. He is accompanied by his wife. I did previously been notified by Dr. Chales Abrahams primary physician about this patient. He apparently fractured his right acetabulum in July. Sometime in the recovery of this developed a pressure ulcer on the right heel. When I was first contacted by Dr. Chales Abrahams this was described as a necrotic wound and indeed this is an accurate description. Unfortunately cultures of this have shown MRSA and after 1 negative x-ray a subsequent x-ray on 01/02/2021 showed changes of osteomyelitis. The patient was started on IV vancomycin at the facility and I believe is also on oral trimethoprim/sulfamethoxazole. They are using silver alginate which was done at my suggestion. Sedimentation rate and the said facility was 55 CRP P was also elevated at 73.7 compatible with his  osteomyelitis Past medical history includes a right acetabular fracture, DVT in the right femoral vein also in September on Eliquis, fairly severe Parkinson's disease. He has had [facility] [place] in the skilled facility. ABI in our clinic was noncompressible on the left Patient History Information obtained from Patient. Allergies No Known Drug Allergies Family History Unknown History. Social History Never smoker, Marital Status - Married, Alcohol Use - Never, Drug Use - No History, Caffeine Use - Rarely. Medical History Cardiovascular Patient has history of Hypertension Musculoskeletal Patient has history of Osteoarthritis Medical A Surgical History Notes nd Cardiovascular Hyperlipdemia Review of Systems (ROS) Constitutional Symptoms (General Health) Denies complaints or symptoms of Fatigue, Fever, Chills, Marked Weight Change. Eyes Denies complaints or symptoms of Dry Eyes, Vision Changes, Glasses / Contacts. Ear/Nose/Mouth/Throat Denies complaints or symptoms of Chronic sinus problems or rhinitis. Respiratory Denies complaints or symptoms of Chronic or frequent coughs, Shortness of Breath. Gastrointestinal Denies complaints or symptoms of Frequent diarrhea, Nausea, Vomiting. Endocrine Denies complaints or symptoms of Heat/cold intolerance. Genitourinary Denies complaints or symptoms of  Frequent urination. Integumentary (Skin) Complains or has symptoms of Wounds - wound left heel. Neurologic Denies complaints or symptoms of Numbness/parasthesias. Psychiatric Denies complaints or symptoms of Claustrophobia, Suicidal. Objective Constitutional Sitting or standing Blood Pressure is within target range for patient.. Pulse regular and within target range for patient.Marland Kitchen Respirations regular, non-labored and within target range.. Temperature is normal and within the target range for the patient.Marland Kitchen Appears in no distress. Vitals Time Taken: 1:38 PM, Height: 73 in, Source: Stated,  Weight: 180 lbs, Source: Stated, BMI: 23.7, Temperature: 98.6 F, Pulse: 81 bpm, Respiratory Rate: 16 breaths/min, Blood Pressure: 112/72 mmHg. Respiratory work of breathing is normal. Cardiovascular Dorsalis pedis pulse on the left is present but very faint. I could not feel the posterior tibial. Popliteal pulse is palpable.. Neurological Fairly significant Parkinson's disease. General Notes: Wound exam; left heel involving the tip and posterior surface. Completely necrotic surface and malodorous. I used a #15 scalpel and pickups to remove necrotic eschar and some necrotic subcutaneous tissue. I then switched to a #5 curette. The area area area was vigorously then washed with gauze and wound cleanser. This does not probe to bone but I am not sure I see a viable surface yet either. There is no surrounding erythema. The patient did not experience much pain Integumentary (Hair, Skin) Wound #1 status is Open. Original cause of wound was Blister. The date acquired was: 10/24/2020. The wound is located on the Left Calcaneus. The wound measures 6cm length x 10cm width x 0.1cm depth; 47.124cm^2 area and 4.712cm^3 volume. There is no tunneling or undermining noted. There is a medium amount of serosanguineous drainage noted. Foul odor after cleansing was noted. The wound margin is distinct with the outline attached to the wound base. There is a large (67-100%) amount of necrotic tissue within the wound bed including Eschar and Adherent Slough. Assessment Active Problems ICD-10 Pressure ulcer of left heel, unstageable Other acute osteomyelitis, left ankle and foot Methicillin resistant Staphylococcus aureus infection as the cause of diseases classified elsewhere Procedures Wound #1 Pre-procedure diagnosis of Wound #1 is a Pressure Ulcer located on the Left Calcaneus . There was a Excisional Skin/Subcutaneous Tissue Debridement with a total area of 48 sq cm performed by Greg Caul., MD. With  the following instrument(s): Blade, Curette, and Forceps to remove Viable and Non-Viable tissue/material. Material removed includes Eschar, Subcutaneous Tissue, and Slough. No specimens were taken. A time out was conducted at 14:08, prior to the start of the procedure. A Moderate amount of bleeding was controlled with Pressure. The procedure was tolerated well with a pain level of 0 throughout and a pain level of 0 following the procedure. Post Debridement Measurements: 6cm length x 10cm width x 0.1cm depth; 4.712cm^3 volume. Post debridement Stage noted as Unstageable/Unclassified. Character of Wound/Ulcer Post Debridement requires further debridement. Post procedure Diagnosis Wound #1: Same as Pre-Procedure Plan Follow-up Appointments: Return Appointment in 2 weeks. - with Dr. Leanord Hawking Tuesday 10/11 or Wednesday 10/12 Bathing/ Shower/ Hygiene: May shower with protection but do not get wound dressing(s) wet. Edema Control - Lymphedema / SCD / Other: Elevate legs to the level of the heart or above for 30 minutes daily and/or when sitting, a frequency of: - throughout the day Off-Loading: Turn and reposition every 2 hours Other: - Float heels off of bed/chair with pillow under calves Services and Therapies ordered were: Arterial Studies- Bilateral w/ABIs, TBIs, and arterial dopplers - Non healing wound left heel WOUND #1: - Calcaneus Wound Laterality: Left Cleanser: Wound  Cleanser 1 x Per Day/15 Days Discharge Instructions: Cleanse the wound with wound cleanser prior to applying a clean dressing using gauze sponges, not tissue or cotton balls. Prim Dressing: KerraCel Ag Gelling Fiber Dressing, 4x5 in (silver alginate) 1 x Per Day/15 Days ary Discharge Instructions: Apply silver alginate to wound bed, over Santyl Prim Dressing: Santyl Ointment 1 x Per Day/15 Days ary Discharge Instructions: Apply nickel thick amount to wound bed, under silver alginate Secondary Dressing: Woven Gauze Sponge,  Non-Sterile 4x4 in 1 x Per Day/15 Days Discharge Instructions: Apply over primary dressing as directed. Secondary Dressing: ALLEVYN Heel 4 1/2in x 5 1/2in / 10.5cm x 13.5cm 1 x Per Day/15 Days Discharge Instructions: Apply over primary dressing as directed. Secured With: [employer], 4.5x3.1 (in/yd) 1 x Per Day/15 Days Discharge Instructions: Secure with Kerlix as directed. Secured With: Paper T ape, 2x10 (in/yd) 1 x Per Day/15 Days Discharge Instructions: Secure dressing with tape as directed. 1. Unstageable pressure ulcer right heel with complicating infection mostly related to MRSA and a recent x-ray showing osteomyelitis. I agree with the IV vancomycin. He also appears to be on oral Septra. 2. He will need formal arterial studies including ABIs TBI's and arterial Dopplers. His clinical exam suggested some degree of PAD. Concerning enough to order the full studies. This cannot be done in the facility 3. I am changing the primary dressing to Santyl covering with alginate change daily 4. I will see him again in 10 days due to competing doctors appointments including Dr. Algis Liming next Friday the week after next. 5. At that point we may look at this with [facility]. We should have Dr. Lattie Haw input by that point to 6. This is a dangerous situation. He will need meticulous offloading. There is no exposed bone and this would not be easy to obtain I spent 45 minutes in review of this patient's past medical history, discussion with primary care, face-to-face evaluation and preparation of this record Electronic Signature(s) Signed: 01/04/2021 3:46:44 PM By: Greg Najjar MD Entered By: Greg [name] on 01/04/2021 14:37:44 -------------------------------------------------------------------------------- HxROS Details Patient Name: Date of Service: Greg [name] 01/04/2021 1:15 PM Medical Record Number: [id number] Patient Account Number: [id number] Date of Birth/Sex: Treating  RN: [birth year] (87 y.o. Elizebeth Koller Primary Care Provider: Einar [name] Other Clinician: Referring Provider: Treating Provider/Extender: Greg [name] in Treatment: 0 Information Obtained From Patient Constitutional Symptoms (General Health) Complaints and Symptoms: Negative for: Fatigue; Fever; Chills; Marked Weight Change Eyes Complaints and Symptoms: Negative for: Dry Eyes; Vision Changes; Glasses / Contacts Ear/Nose/Mouth/Throat Complaints and Symptoms: Negative for: Chronic sinus problems or rhinitis Respiratory Complaints and Symptoms: Negative for: Chronic or frequent coughs; Shortness of Breath Gastrointestinal Complaints and Symptoms: Negative for: Frequent diarrhea; Nausea; Vomiting Endocrine Complaints and Symptoms: Negative for: Heat/cold intolerance Genitourinary Complaints and Symptoms: Negative for: Frequent urination Integumentary (Skin) Complaints and Symptoms: Positive for: Wounds - wound left heel Neurologic Complaints and Symptoms: Negative for: Numbness/parasthesias Psychiatric Complaints and Symptoms: Negative for: Claustrophobia; Suicidal Hematologic/Lymphatic Cardiovascular Medical History: Positive for: Hypertension Past Medical History Notes: Hyperlipdemia Immunological Musculoskeletal Medical History: Positive for: Osteoarthritis Oncologic Immunizations Pneumococcal Vaccine: Received Pneumococcal Vaccination: Yes Received Pneumococcal Vaccination On or After 60th Birthday: Yes Implantable Devices None Family and Social History Unknown History: Yes; Never smoker; Marital Status - Married; Alcohol Use: Never; Drug Use: No History; Caffeine Use: Rarely; Financial Concerns: No; Food, Clothing or Shelter Needs: No; Support System Lacking: No; Transportation Concerns: No Electronic Signature(s) Signed: 01/04/2021 3:46:44  PM By: Greg Najjar MD Signed: 01/04/2021 4:27:12 PM By: Zandra Abts RN,  BSN Entered By: Zandra Abts on 01/04/2021 13:47:09 -------------------------------------------------------------------------------- SuperBill Details Patient Name: Date of Service: Greg [name] 01/04/2021 Medical Record Number: [id number] Patient Account Number: [id number] Date of Birth/Sex: Treating RN: [birth year] (87 y.o. M) Primary Care Provider: Einar [name] Other Clinician: Referring Provider: Treating Provider/Extender: Greg [name], Greg [name] in Treatment: 0 Diagnosis Coding ICD-10 Codes Code Description [phone] Pressure ulcer of left heel, unstageable M86.172 Other acute osteomyelitis, left ankle and foot B95.62 Methicillin resistant Staphylococcus aureus infection as the cause of diseases classified elsewhere Facility Procedures CPT4 Code: 78588502 Description: 99213 - WOUND CARE VISIT-LEV 3 EST PT Modifier: 25 Quantity: 1 CPT4 Code: [id number] Description: 11042 - DEB SUBQ TISSUE 20 SQ CM/< ICD-10 Diagnosis Description L89.620 Pressure ulcer of left heel, unstageable Modifier: Quantity: 1 CPT4 Code: [id number] Description: 11045 - DEB SUBQ TISS EA ADDL 20CM ICD-10 Diagnosis Description L89.620 Pressure ulcer of left heel, unstageable Modifier: Quantity: 2 Physician Procedures : CPT4 Code Description Modifier [id number] 99204 - WC PHYS LEVEL 4 - NEW PT 25 ICD-10 Diagnosis Description L89.620 Pressure ulcer of left heel, unstageable M86.172 Other acute osteomyelitis, left ankle and foot B95.62 Methicillin resistant Staphylococcus  aureus infection as the cause of diseases classified elsewhere Quantity: 1 : [id number] 11042 - WC PHYS SUBQ TISS 20 SQ CM ICD-10 Diagnosis Description L89.620 Pressure ulcer of left heel, unstageable Quantity: 1 : [id number] 11045 - WC PHYS SUBQ TISS EA ADDL 20 CM ICD-10 Diagnosis Description L89.620 Pressure ulcer of left heel, unstageable Quantity: 2 Electronic Signature(s) Signed: 01/04/2021 4:27:12 PM By: Zandra Abts RN,  BSN Signed: 01/09/2021 10:14:24 AM By: Greg Najjar MD Previous Signature: 01/04/2021 3:46:44 PM Version By: Greg Najjar MD Entered By: Zandra Abts on 01/04/2021 16:05:57

## 2021-01-10 ENCOUNTER — Ambulatory Visit (INDEPENDENT_AMBULATORY_CARE_PROVIDER_SITE_OTHER): Payer: Medicare Other | Admitting: Neurology

## 2021-01-10 ENCOUNTER — Other Ambulatory Visit (HOSPITAL_COMMUNITY): Payer: Self-pay | Admitting: Internal Medicine

## 2021-01-10 ENCOUNTER — Encounter: Payer: Self-pay | Admitting: Neurology

## 2021-01-10 ENCOUNTER — Other Ambulatory Visit: Payer: Self-pay

## 2021-01-10 VITALS — BP 119/58 | HR 72 | Wt 181.5 lb

## 2021-01-10 DIAGNOSIS — G4752 REM sleep behavior disorder: Secondary | ICD-10-CM | POA: Diagnosis not present

## 2021-01-10 DIAGNOSIS — G20 Parkinson's disease: Secondary | ICD-10-CM

## 2021-01-10 DIAGNOSIS — Z452 Encounter for adjustment and management of vascular access device: Secondary | ICD-10-CM

## 2021-01-10 DIAGNOSIS — R441 Visual hallucinations: Secondary | ICD-10-CM

## 2021-01-10 NOTE — Patient Instructions (Addendum)
Consider melatonin, 3 mg at bed, if the dreams get bad/are active. Be very careful with the walking.  You are at fall risk and falls would be very dangerous on eliquis

## 2021-01-11 ENCOUNTER — Ambulatory Visit (HOSPITAL_COMMUNITY): Payer: Medicare Other | Admitting: Infectious Disease

## 2021-01-11 ENCOUNTER — Telehealth: Payer: Self-pay | Admitting: Orthopedic Surgery

## 2021-01-11 ENCOUNTER — Other Ambulatory Visit: Payer: Self-pay

## 2021-01-11 ENCOUNTER — Encounter: Payer: Self-pay | Admitting: Infectious Disease

## 2021-01-11 VITALS — BP 108/53 | HR 79 | Temp 98.0°F | Resp 16 | Ht 69.0 in | Wt 189.0 lb

## 2021-01-11 DIAGNOSIS — S32411D Displaced fracture of anterior wall of right acetabulum, subsequent encounter for fracture with routine healing: Secondary | ICD-10-CM | POA: Diagnosis not present

## 2021-01-11 DIAGNOSIS — L89620 Pressure ulcer of left heel, unstageable: Secondary | ICD-10-CM

## 2021-01-11 DIAGNOSIS — I10 Essential (primary) hypertension: Secondary | ICD-10-CM

## 2021-01-11 DIAGNOSIS — I82411 Acute embolism and thrombosis of right femoral vein: Secondary | ICD-10-CM

## 2021-01-11 DIAGNOSIS — M86172 Other acute osteomyelitis, left ankle and foot: Secondary | ICD-10-CM | POA: Insufficient documentation

## 2021-01-11 DIAGNOSIS — N1832 Chronic kidney disease, stage 3b: Secondary | ICD-10-CM | POA: Diagnosis not present

## 2021-01-11 DIAGNOSIS — A4902 Methicillin resistant Staphylococcus aureus infection, unspecified site: Secondary | ICD-10-CM | POA: Insufficient documentation

## 2021-01-11 DIAGNOSIS — G20 Parkinson's disease: Secondary | ICD-10-CM

## 2021-01-11 HISTORY — DX: Methicillin resistant Staphylococcus aureus infection, unspecified site: A49.02

## 2021-01-11 HISTORY — DX: Other acute osteomyelitis, left ankle and foot: M86.172

## 2021-01-11 NOTE — Addendum Note (Signed)
Addended by: Clayborne Artist A on: 01/11/2021 09:24 AM   Modules accepted: Orders

## 2021-01-11 NOTE — Telephone Encounter (Signed)
09/30 BUN/creat 47/2.12, torsemide discontinued. IV vancomycin started for MRSA infection to left heel/osteomyelitis. Orders for PICC line placed/ ID consult made. BMP/vanc trough scheduled to be drawn 01/11/2021. ID consult 01/11/2021. F/u with Dr. Leanord Hawking 01/15/2021.

## 2021-01-11 NOTE — Progress Notes (Signed)
Subjective:  Reason for infectious disease consult: Calcaneal osteomyelitis  Requesting physician Greg Najjar, MD   Patient ID: Greg [name], male    DOB: 11-02-1931, 87 y.o.   MRN: [id number]  HPI  Greg [name] is an 87 year old Caucasian man with a past medical history significant for parkinsonism, chronic kidney disease, hypertension recent fall and hip fracture on the right status post ORIF by Dr. Marcello Fennel.  He then developed a pressure ulcer apparently on his left heel that has worsened despite wound care at his skilled nursing facility, now with referral to wound care and being followed by Dr. Leanord Hawking.  Ultimately a culture was taken from the wound although I do not have access to it which grew methicillin-resistant Staph aureus.  The patient had been on doxycycline and switched to Bactrim making me presume that it was doxycycline resistant.  He has now on intravenous vancomycin.  Is been referred to our clinic for further evaluation and management.    Past Medical History:  Diagnosis Date   Acute bronchiolitis    Anemia    Bursitis of both hips    Hearing reduced    hearing aid   History of basal cell carcinoma    Hyperlipidemia    Malaise and fatigue    Osteoarthritis    hand   Osteoporosis    Palpitation    Vitamin D deficiency     Past Surgical History:  Procedure Laterality Date   CATARACT EXTRACTION  05/31/2015   Eye/right   COLONOSCOPY  02/05/2005, 03/06/2005   normal, 10 years ago   MOHS micrographic surgery     Face surgery   ORIF ACETABULAR FRACTURE Right 10/09/2020   Procedure: OPEN REDUCTION INTERNAL FIXATION (ORIF) ACETABULAR FRACTURE;  Surgeon: [name], MD;  Location: [facility] OR;  Service: Orthopedics;  Laterality: Right;    Family History  Problem Relation Age of Onset   Prostate cancer Father    Skin cancer Brother       Social History   Socioeconomic History   Marital status: Married    Spouse name: [name]   Number of children: 2    Years of education: Not on file   Highest education level: Not on file  Occupational History   Occupation: retired    Comment: art professor  Tobacco Use   Smoking status: Never   Smokeless tobacco: Never  Vaping Use   Vaping Use: Never used  Substance and Sexual Activity   Alcohol use: Never   Drug use: Never   Sexual activity: Not on file  Other Topics Concern   Not on file  Social History Narrative   Pt just move from [place] to [place]   Pt leaving with wife   Social Determinants of Health   Financial Resource Strain: Not on file  Food Insecurity: Not on file  Transportation Needs: Not on file  Physical Activity: Not on file  Stress: Not on file  Social Connections: Not on file    No Known Allergies   Current Outpatient Medications:    acetaminophen (TYLENOL) 325 MG tablet, Take 1-2 tablets (325-650 mg total) by mouth every 6 (six) hours as needed for mild pain (pain score 1-3 or temp > 100.5)., Disp: , Rfl:    apixaban (ELIQUIS) 5 MG TABS tablet, Take 2 tablets (10 mg total) by mouth 2 (two) times daily for 7 days., Disp: 28 tablet, Rfl: 0   apixaban (ELIQUIS) 5 MG TABS tablet, Take 1 tablet (5 mg total) by mouth 2 (  two) times daily., Disp: 60 tablet, Rfl: 3   Calcium Carbonate-Vitamin D (CALCIUM-VITAMIN D3 PO), Take 600 mg by mouth in the morning., Disp: , Rfl:    carbidopa-levodopa (SINEMET CR) 50-200 MG tablet, Take 1 tablet by mouth at bedtime., Disp: 90 tablet, Rfl: 1   carbidopa-levodopa (SINEMET IR) 25-100 MG tablet, Take 1 tablet by mouth 4 (four) times daily., Disp: 360 tablet, Rfl: 1   polyethylene glycol (MIRALAX) 17 g packet, Take 17 g by mouth daily., Disp: 14 each, Rfl: 0   potassium chloride SA (KLOR-CON) 20 MEQ tablet, Take 40 mEq by mouth daily., Disp: , Rfl:    senna (SENOKOT) 8.6 MG TABS tablet, Take 1 tablet (8.6 mg total) by mouth daily., Disp: 120 tablet, Rfl: 0   sulfamethoxazole-trimethoprim (BACTRIM DS) 800-160 MG tablet, Take 1 tablet by mouth 2  (two) times daily., Disp: , Rfl:    torsemide (DEMADEX) 20 MG tablet, Take 40 mg by mouth daily., Disp: , Rfl:    zinc oxide 20 % ointment, Apply 1 application topically as needed for irritation., Disp: , Rfl:    Review of Systems  Constitutional:  Negative for activity change, appetite change, chills, diaphoresis, fatigue, fever and unexpected weight change.  HENT:  Negative for congestion, rhinorrhea, sinus pressure, sneezing, sore throat and trouble swallowing.   Eyes:  Negative for photophobia and visual disturbance.  Respiratory:  Negative for cough, chest tightness, shortness of breath, wheezing and stridor.   Cardiovascular:  Negative for chest pain, palpitations and leg swelling.  Gastrointestinal:  Negative for abdominal distention, abdominal pain, anal bleeding, blood in stool, constipation, diarrhea, nausea and vomiting.  Genitourinary:  Negative for difficulty urinating, dysuria, flank pain and hematuria.  Musculoskeletal:  Negative for arthralgias, back pain, gait problem, joint swelling and myalgias.  Skin:  Positive for wound. Negative for color change, pallor and rash.  Neurological:  Negative for dizziness, tremors, weakness and light-headedness.  Hematological:  Negative for adenopathy. Does not bruise/bleed easily.  Psychiatric/Behavioral:  Negative for agitation, behavioral problems, confusion, decreased concentration, dysphoric mood and sleep disturbance.       Objective:   Physical Exam Constitutional:      Appearance: He is well-developed.  HENT:     Head: Normocephalic and atraumatic.  Eyes:     Conjunctiva/sclera: Conjunctivae normal.  Cardiovascular:     Rate and Rhythm: Normal rate and regular rhythm.  Pulmonary:     Effort: Pulmonary effort is normal. No respiratory distress.     Breath sounds: No wheezing.  Abdominal:     General: There is no distension.     Palpations: Abdomen is soft.  Musculoskeletal:        General: Normal range of motion.      Cervical back: Normal range of motion and neck supple.  Skin:    General: Skin is warm and dry.     Coloration: Skin is pale.     Findings: No erythema or rash.  Neurological:     Mental Status: He is alert and oriented to person, place, and time.  Psychiatric:        Attention and Perception: He is inattentive.        Speech: Speech is delayed.        Behavior: Behavior is cooperative.        Thought Content: Thought content normal.        Judgment: Judgment normal.    Left heel ulcer 01/11/2021:         Assessment &  Plan:   Left calcaneal osteomyelitis:  I have made it clear to the patient and his wife that the only curative option for this type of infection of bone is a below the knee amputation.  The patient however is not yet emotionally ready to undergo this type of surgery.  They would instead of her prefer the second option which I have prefer propose as a way of dealing with this deep infection, that is IV antibiotics followed by oral antibiotics.  This approach is also not without risk however given his chronic kidney disease and nephrotoxic risks of vancomycin and later Bactrim if we use that as his suppressive antibiotic.  We also do not know for sure what the culprit organism is that is causing his osteomyelitis though the pretest probability of MRSA being a player is quite high  Therefore, we will continue vancomycin dosed by pharmacy.  Please obtain twice weekly BMP with vancomycin levels and fax them to our clinic at [phone]  Please also obtain weekly sed rate CRP weekly CBC with differential and also faxed to [place].  Please fax the culture data with sensitivities of the MRSA organism that was isolated  Continue offloading wound care and follow-up with Dr. Leanord Hawking  I spent 84  minutes with the patient including than 50% of the time in face to face counseling of the patient his wife regarding the nature of deep infections and osteomyelitis and how they need  to be typically managed with both surgical and medical approach the curative surgery for this particular location, alternative approaches which include long IV antibiotics and pills indefinitely personally reviewing films available in the computer along with review of medical records in preparation for the visit and during the visit and in coordination of his care.

## 2021-01-12 LAB — BASIC METABOLIC PANEL
BUN: 29 — AB (ref 4–21)
CO2: 27 — AB (ref 13–22)
Chloride: 103 (ref 99–108)
Creatinine: 1.3 (ref 0.6–1.3)
Glucose: 106
Potassium: 5 (ref 3.4–5.3)
Sodium: 135 — AB (ref 137–147)

## 2021-01-12 LAB — COMPREHENSIVE METABOLIC PANEL: Calcium: 8.5 — AB (ref 8.7–10.7)

## 2021-01-14 ENCOUNTER — Ambulatory Visit (HOSPITAL_COMMUNITY)
Admission: RE | Admit: 2021-01-14 | Discharge: 2021-01-14 | Disposition: A | Discharge status: Home / Self Care | Payer: Medicare Other | Source: Ambulatory Visit | Attending: Internal Medicine | Admitting: Internal Medicine

## 2021-01-14 ENCOUNTER — Other Ambulatory Visit: Payer: Self-pay

## 2021-01-14 ENCOUNTER — Other Ambulatory Visit (HOSPITAL_COMMUNITY): Payer: Self-pay | Admitting: Internal Medicine

## 2021-01-14 DIAGNOSIS — Z452 Encounter for adjustment and management of vascular access device: Secondary | ICD-10-CM

## 2021-01-14 MED ORDER — LIDOCAINE HCL 1 % IJ SOLN
INTRAMUSCULAR | Status: AC
  Filled 2021-01-14: qty 20

## 2021-01-14 MED ORDER — HEPARIN SOD (PORK) LOCK FLUSH 100 UNIT/ML IV SOLN
INTRAVENOUS | Status: AC
  Filled 2021-01-14: qty 5

## 2021-01-14 MED ORDER — LIDOCAINE HCL (PF) 1 % IJ SOLN
INTRAMUSCULAR | Status: DC | PRN
  Administered 2021-01-14: 5 mL

## 2021-01-14 NOTE — Procedures (Signed)
PROCEDURE SUMMARY:  Successful placement of single lumen PICC line to right basilic vein. Length 43 cm Tip at lower SVC/RA PICC capped No complications Ready for use  EBL < 5 mL   Karyna Bessler H Dewaine Morocho PA-C 01/14/2021, 2:22 PM

## 2021-01-15 ENCOUNTER — Encounter (HOSPITAL_BASED_OUTPATIENT_CLINIC_OR_DEPARTMENT_OTHER): Payer: Medicare Other | Attending: Internal Medicine | Admitting: Internal Medicine

## 2021-01-15 DIAGNOSIS — M86172 Other acute osteomyelitis, left ankle and foot: Secondary | ICD-10-CM | POA: Insufficient documentation

## 2021-01-15 DIAGNOSIS — G20 Parkinson's disease: Secondary | ICD-10-CM | POA: Insufficient documentation

## 2021-01-15 DIAGNOSIS — L89620 Pressure ulcer of left heel, unstageable: Secondary | ICD-10-CM | POA: Diagnosis not present

## 2021-01-15 DIAGNOSIS — Z7901 Long term (current) use of anticoagulants: Secondary | ICD-10-CM | POA: Diagnosis not present

## 2021-01-15 DIAGNOSIS — L89619 Pressure ulcer of right heel, unspecified stage: Secondary | ICD-10-CM | POA: Diagnosis present

## 2021-01-15 DIAGNOSIS — B9562 Methicillin resistant Staphylococcus aureus infection as the cause of diseases classified elsewhere: Secondary | ICD-10-CM | POA: Insufficient documentation

## 2021-01-15 LAB — BASIC METABOLIC PANEL
BUN: 24 — AB (ref 4–21)
CO2: 23 — AB (ref 13–22)
Chloride: 104 (ref 99–108)
Creatinine: 1.3 (ref 0.6–1.3)
Glucose: 87
Potassium: 4.4 (ref 3.4–5.3)
Sodium: 137 (ref 137–147)

## 2021-01-15 LAB — CBC: RBC: 3.83 — AB (ref 3.87–5.11)

## 2021-01-15 LAB — CBC AND DIFFERENTIAL
HCT: 33 — AB (ref 41–53)
Hemoglobin: 10.3 — AB (ref 13.5–17.5)
Neutrophils Absolute: 4515
Platelets: 267 (ref 150–399)
WBC: 7

## 2021-01-15 LAB — COMPREHENSIVE METABOLIC PANEL: Calcium: 8.5 — AB (ref 8.7–10.7)

## 2021-01-15 LAB — POCT ERYTHROCYTE SEDIMENTATION RATE, NON-AUTOMATED: Sed Rate: 48

## 2021-01-15 NOTE — Progress Notes (Signed)
Greg [name] ([id number]) , Visit Report for 01/15/2021 Arrival Information Details Patient Name: Date of Service: Greg [name] [facility] 01/15/2021 8:45 A M Medical Record Number: [id number] Patient Account Number: [id number] Date of Birth/Sex: Treating RN: Jan 21, 1932 (87 y.o. Greg [name], Millard.Loa Primary Care Greg [name]: [name] Other Clinician: Referring Kimiyah Blick: Treating Mont Jagoda/Extender: Dyke Maes in Treatment: 1 Visit Information History Since Last Visit All ordered tests and consults were completed: Yes Patient Arrived: Wheel Chair Added or deleted any medications: Yes Arrival Time: 08:50 Any new allergies or adverse reactions: No Accompanied By: wife Had a fall or experienced change in No Transfer Assistance: Manual activities of daily living that may affect Patient Identification Verified: Yes risk of falls: Secondary Verification Process Completed: Yes Signs or symptoms of abuse/neglect since last visito No Patient Requires Transmission-Based Precautions: No Hospitalized since last visit: No Patient Has Alerts: Yes Implantable device outside of the clinic excluding No Patient Alerts: Patient on Blood Thinner cellular tissue based products placed in the center L ABI non compressible since last visit: Has Dressing in Place as Prescribed: Yes Pain Present Now: No Electronic Signature(s) Signed: 01/15/2021 5:07:29 PM By: Shawn Stall RN, BSN Entered By: Shawn Stall on 01/15/2021 08:58:26 -------------------------------------------------------------------------------- Encounter Discharge Information Details Patient Name: Date of Service: Greg [name], [name] 01/15/2021 8:45 A M Medical Record Number: [id number] Patient Account Number: [id number] Date of Birth/Sex: Treating RN: Aug 31, 1931 (87 y.o. Greg [name] Primary Care Greg [name]: [name] Other Clinician: Referring Greg [name]: Treating Greg [name]/Extender: Dyke Maes in Treatment: 1 Encounter Discharge Information Items Post Procedure Vitals Discharge Condition: Stable Temperature (F): 98.4 Ambulatory Status: Wheelchair Pulse (bpm): 84 Discharge Destination: Skilled Nursing Facility Respiratory Rate (breaths/min): 18 Telephoned: No Blood Pressure (mmHg): 121/70 Orders Sent: Yes Transportation: Private Auto Accompanied By: wife Schedule Follow-up Appointment: Yes Clinical Summary of Care: Electronic Signature(s) Signed: 01/15/2021 5:07:29 PM By: Shawn Stall RN, BSN Entered By: Shawn Stall on 01/15/2021 09:40:55 -------------------------------------------------------------------------------- Lower Extremity Assessment Details Patient Name: Date of Service: Greg [name] GER 01/15/2021 8:45 A M Medical Record Number: [id number] Patient Account Number: [id number] Date of Birth/Sex: Treating RN: 12/08/1931 (87 y.o. Greg [name] Primary Care Greg [name]: [name] Other Clinician: Referring Greg [name]: Treating Greg [name]/Extender: Jeanice Lim Weeks in Treatment: 1 Edema Assessment Assessed: Kyra Searles: Yes] Franne Forts: No] Edema: [Left: Ye] [Right: s] Calf Left: Right: Point of Measurement: 36 cm From Medial Instep 39 cm Ankle Left: Right: Point of Measurement: 11 cm From Medial Instep 28 cm Vascular Assessment Pulses: Dorsalis Pedis Palpable: [Left:Yes] Electronic Signature(s) Signed: 01/15/2021 5:07:29 PM By: Shawn Stall RN, BSN Entered By: Shawn Stall on 01/15/2021 08:59:26 -------------------------------------------------------------------------------- Multi Wound Chart Details Patient Name: Date of Service: Greg [name], [name] 01/15/2021 8:45 A M Medical Record Number: [id number] Patient Account Number: [id number] Date of Birth/Sex: Treating RN: [birth year] (87 y.o. Greg [name] Primary Care Greg [name]: [name] Other Clinician: Referring Greg [name]: Treating Chemeka Filice/Extender: Jeanice Lim Weeks in Treatment: 1 Vital Signs Height(in): 73 Pulse(bpm): 84 Weight(lbs): 180 Blood Pressure(mmHg): 121/70 Body Mass Index(BMI): 24 Temperature(F): 98.4 Respiratory Rate(breaths/min): 18 Photos: [1:Left Calcaneus] [N/A:N/A N/A] Wound Location: [1:Blister] [N/A:N/A] Wounding Event: [1:Pressure Ulcer] [N/A:N/A] Primary Etiology: [1:Hypertension, Osteoarthritis] [N/A:N/A] Comorbid History: [1:10/24/2020] [N/A:N/A] Date Acquired: [1:1] [N/A:N/A] Weeks of Treatment: [1:Open] [N/A:N/A] Wound Status: [1:5x7x0.3] [N/A:N/A] Measurements L x W x D (cm) [1:27.489] [N/A:N/A] A (cm) : rea [1:8.247] [N/A:N/A] Volume (cm) : [1:41.70%] [N/A:N/A] % Reduction in A rea: [1:-75.00%] [N/A:N/A] % Reduction in Volume: [1:Unstageable/Unclassified] [  N/A:N/A] Classification: [1:Medium] [N/A:N/A] Exudate A mount: [1:Serosanguineous] [N/A:N/A] Exudate Type: [1:red, brown] [N/A:N/A] Exudate Color: [1:Yes] [N/A:N/A] Foul Odor A Cleansing: [1:fter No] [N/A:N/A] Odor A nticipated Due to Product Use: [1:Distinct, outline attached] [N/A:N/A] Wound Margin: [1:N/A] [N/A:N/A] Necrotic A mount: [1:Eschar, Adherent Slough] [N/A:N/A] Necrotic Tissue: [1:Fascia: No] [N/A:N/A] Exposed Structures: [1:Fat Layer (Subcutaneous Tissue): No Tendon: No Muscle: No Joint: No Bone: No Small (1-33%)] [N/A:N/A] Epithelialization: [1:Chemical/Enzymatic/Mechanical] [N/A:N/A] Debridement: [1:N/A] [N/A:N/A] Instrument: [1:None] [N/A:N/A] Bleeding: [1:Pressure] [N/A:N/A] Hemostasis A chieved: Debridement Treatment Response: Procedure was tolerated well [N/A:N/A] Post Debridement Measurements L x 5x7x0.3 [N/A:N/A] W x D (cm) [1:8.247] [N/A:N/A] Post Debridement Volume: (cm) [1:Unstageable/Unclassified] [N/A:N/A] Post Debridement Stage: [1:Debridement] [N/A:N/A] Treatment Notes Wound #1 (Calcaneus) Wound Laterality: Left Cleanser Wound Cleanser Discharge Instruction: Cleanse the wound with wound  cleanser prior to applying a clean dressing using gauze sponges, not tissue or cotton balls. Peri-Wound Care Topical Primary Dressing KerraCel Ag Gelling Fiber Dressing, 4x5 in (silver alginate) Discharge Instruction: Apply silver alginate to wound bed, over Santyl Santyl Ointment Discharge Instruction: Apply nickel thick amount to wound bed, under silver alginate Secondary Dressing Woven Gauze Sponge, Non-Sterile 4x4 in Discharge Instruction: Apply over primary dressing as directed. ALLEVYN Heel 4 1/2in x 5 1/2in / 10.5cm x 13.5cm Discharge Instruction: Apply over primary dressing as directed. Secured With [employer], 4.5x3.1 (in/yd) Discharge Instruction: Secure with Kerlix as directed. Paper Tape, 2x10 (in/yd) Discharge Instruction: Secure dressing with tape as directed. Compression Wrap Compression Stockings Add-Ons Electronic Signature(s) Signed: 01/15/2021 10:02:54 AM By: Geralyn Corwin DO Signed: 01/15/2021 5:07:29 PM By: Shawn Stall RN, BSN Entered By: Geralyn Corwin on 01/15/2021 09:57:57 -------------------------------------------------------------------------------- Multi-Disciplinary Care Plan Details Patient Name: Date of Service: Greg [name], [name] 01/15/2021 8:45 A M Medical Record Number: [id number] Patient Account Number: [id number] Date of Birth/Sex: Treating RN: 1932/01/24 (87 y.o. Greg [name], Millard.Loa Primary Care Trinten Boudoin: [name] Other Clinician: Referring Rafaela Dinius: Treating Dennise Bamber/Extender: Dyke Maes in Treatment: 1 Multidisciplinary Care Plan reviewed with physician Active Inactive Abuse / Safety / Falls / Self Care Management Nursing Diagnoses: History of Falls Potential for injury related to falls Goals: Patient will not experience any injury related to falls Date Initiated: 01/04/2021 Target Resolution Date: 02/01/2021 Goal Status: Active Patient/caregiver will verbalize/demonstrate measures taken  to prevent injury and/or falls Date Initiated: 01/04/2021 Target Resolution Date: 02/01/2021 Goal Status: Active Interventions: Assess Activities of Daily Living upon admission and as needed Assess fall risk on admission and as needed Assess: immobility, friction, shearing, incontinence upon admission and as needed Assess impairment of mobility on admission and as needed per policy Assess personal safety and home safety (as indicated) on admission and as needed Assess self care needs on admission and as needed Provide education on fall prevention Provide education on personal and home safety Notes: Pressure Nursing Diagnoses: Knowledge deficit related to causes and risk factors for pressure ulcer development Knowledge deficit related to management of pressures ulcers Potential for impaired tissue integrity related to pressure, friction, moisture, and shear Goals: Patient/caregiver will verbalize risk factors for pressure ulcer development Date Initiated: 01/04/2021 Target Resolution Date: 02/01/2021 Goal Status: Active Patient/caregiver will verbalize understanding of pressure ulcer management Date Initiated: 01/04/2021 Target Resolution Date: 02/01/2021 Goal Status: Active Interventions: Assess: immobility, friction, shearing, incontinence upon admission and as needed Assess offloading mechanisms upon admission and as needed Assess potential for pressure ulcer upon admission and as needed Notes: Wound/Skin Impairment Nursing Diagnoses: Impaired tissue integrity Knowledge deficit related to ulceration/compromised skin integrity Goals: Patient/caregiver  will verbalize understanding of skin care regimen Date Initiated: 01/04/2021 Target Resolution Date: 02/01/2021 Goal Status: Active Interventions: Assess patient/caregiver ability to obtain necessary supplies Assess patient/caregiver ability to perform ulcer/skin care regimen upon admission and as needed Assess ulceration(s)  every visit Provide education on ulcer and skin care Notes: Electronic Signature(s) Signed: 01/15/2021 5:07:29 PM By: Shawn Stall RN, BSN Entered By: Shawn Stall on 01/15/2021 09:05:26 -------------------------------------------------------------------------------- Pain Assessment Details Patient Name: Date of Service: Greg [name], [name] 01/15/2021 8:45 A M Medical Record Number: [id number] Patient Account Number: [id number] Date of Birth/Sex: Treating RN: 1931-11-11 (87 y.o. Greg [name] Primary Care Kaeleigh Westendorf: [name] Other Clinician: Referring Dejon Jungman: Treating Enslie Sahota/Extender: Jeanice Lim Weeks in Treatment: 1 Active Problems Location of Pain Severity and Description of Pain Patient Has Paino No Site Locations Rate the pain. Current Pain Level: 0 Pain Management and Medication Current Pain Management: Medication: No Cold Application: No Rest: No Massage: No Activity: No T.E.N.S.: No Heat Application: No Leg drop or elevation: No Is the Current Pain Management Adequate: Adequate How does your wound impact your activities of daily livingo Sleep: No Bathing: No Appetite: No Relationship With Others: No Bladder Continence: No Emotions: No Bowel Continence: No Work: No Toileting: No Drive: No Dressing: No Hobbies: No Psychologist, prison and probation services) Signed: 01/15/2021 5:07:29 PM By: Shawn Stall RN, BSN Entered By: Shawn Stall on 01/15/2021 08:58:48 -------------------------------------------------------------------------------- Patient/Caregiver Education Details Patient Name: Date of Service: Greg [name], [name] 10/11/2022andnbsp8:45 A M Medical Record Number: [id number] Patient Account Number: [id number] Date of Birth/Gender: Treating RN: Feb 28, 1932 (87 y.o. Greg [name] Primary Care Physician: [name] Other Clinician: Referring Physician: Treating Physician/Extender: Dyke Maes in Treatment:  1 Education Assessment Education Provided To: Patient and Caregiver Education Topics Provided Wound/Skin Impairment: Handouts: Skin Care Do's and Dont's Methods: Explain/Verbal Responses: Reinforcements needed Electronic Signature(s) Signed: 01/15/2021 5:07:29 PM By: Shawn Stall RN, BSN Entered By: Shawn Stall on 01/15/2021 09:07:36 -------------------------------------------------------------------------------- Wound Assessment Details Patient Name: Date of Service: Greg [name], [name] 01/15/2021 8:45 A M Medical Record Number: [id number] Patient Account Number: [id number] Date of Birth/Sex: Treating RN: [birth year] (87 y.o. [name] Primary Care Ayse Mccartin: [name] Other Clinician: Referring Trayquan Kolakowski: Treating Jakhai Fant/Extender: Jeanice Lim Weeks in Treatment: 1 Wound Status Wound Number: 1 Primary Etiology: Pressure Ulcer Wound Location: Left Calcaneus Wound Status: Open Wounding Event: Blister Comorbid History: Hypertension, Osteoarthritis Date Acquired: 10/24/2020 Weeks Of Treatment: 1 Clustered Wound: No Photos Wound Measurements Length: (cm) 5 Width: (cm) 7 Depth: (cm) 0.3 Area: (cm) 27.489 Volume: (cm) 8.247 % Reduction in Area: 41.7% % Reduction in Volume: -75% Epithelialization: Small (1-33%) Tunneling: No Undermining: No Wound Description Classification: Unstageable/Unclassified Wound Margin: Distinct, outline attached Exudate Amount: Medium Exudate Type: Serosanguineous Exudate Color: red, brown Foul Odor After Cleansing: Yes Due to Product Use: No Slough/Fibrino Yes Wound Bed Necrotic Amount: Large (67-100%) Exposed Structure Necrotic Quality: Eschar, Adherent Slough Fascia Exposed: No Fat Layer (Subcutaneous Tissue) Exposed: No Tendon Exposed: No Muscle Exposed: No Joint Exposed: No Bone Exposed: No Treatment Notes Wound #1 (Calcaneus) Wound Laterality: Left Cleanser Wound Cleanser Discharge  Instruction: Cleanse the wound with wound cleanser prior to applying a clean dressing using gauze sponges, not tissue or cotton balls. Peri-Wound Care Topical Primary Dressing KerraCel Ag Gelling Fiber Dressing, 4x5 in (silver alginate) Discharge Instruction: Apply silver alginate to wound bed, over Santyl Santyl Ointment Discharge Instruction: Apply nickel thick amount to wound bed, under silver alginate Secondary Dressing Woven Gauze Sponge,  Non-Sterile 4x4 in Discharge Instruction: Apply over primary dressing as directed. ALLEVYN Heel 4 1/2in x 5 1/2in / 10.5cm x 13.5cm Discharge Instruction: Apply over primary dressing as directed. Secured With [employer], 4.5x3.1 (in/yd) Discharge Instruction: Secure with Kerlix as directed. Paper Tape, 2x10 (in/yd) Discharge Instruction: Secure dressing with tape as directed. Compression Wrap Compression Stockings Add-Ons Electronic Signature(s) Signed: 01/15/2021 5:07:29 PM By: Shawn Stall RN, BSN Signed: 01/15/2021 5:14:50 PM By: Antonieta Iba Entered By: Shawn Stall on 01/15/2021 08:59:58 -------------------------------------------------------------------------------- Vitals Details Patient Name: Date of Service: Greg [name], [name] 01/15/2021 8:45 A M Medical Record Number: [id number] Patient Account Number: [id number] Date of Birth/Sex: Treating RN: [birth year] (87 y.o. Greg [name], Yvonne Kendall Primary Care Dalicia Kisner: [name] Other Clinician: Referring Salisha Bardsley: Treating Yatziry Deakins/Extender: Dyke Maes in Treatment: 1 Vital Signs Time Taken: 08:48 Temperature (F): 98.4 Height (in): 73 Pulse (bpm): 84 Weight (lbs): 180 Respiratory Rate (breaths/min): 18 Body Mass Index (BMI): 23.7 Blood Pressure (mmHg): 121/70 Reference Range: 80 - 120 mg / dl Electronic Signature(s) Signed: 01/15/2021 5:07:29 PM By: Shawn Stall RN, BSN Entered By: Shawn Stall on 01/15/2021 08:58:41

## 2021-01-15 NOTE — Progress Notes (Signed)
Greg [name] ([id number]) , Visit Report for 01/15/2021 Chief Complaint Document Details Patient Name: Date of Service: [name] 01/15/2021 8:45 A M Medical Record Number: [id number] Patient Account Number: [id number] Date of Birth/Sex: Treating RN: Jan 21, 1932 (87 y.o. [name] Primary Care Provider: [name] Other Clinician: Referring Provider: Treating Provider/Extender: Dyke Maes in Treatment: 1 Information Obtained from: Patient Chief Complaint 01/04/2021; patient is here for review of a unstageable pressure ulcer on the left heel Electronic Signature(s) Signed: 01/15/2021 10:02:54 AM By: Geralyn Corwin DO Entered By: Geralyn Corwin on 01/15/2021 09:58:03 -------------------------------------------------------------------------------- Debridement Details Patient Name: Date of Service: Greg [name], [name] 01/15/2021 8:45 A M Medical Record Number: [id number] Patient Account Number: [id number] Date of Birth/Sex: Treating RN: 19-Jan-1932 (87 y.o. [name] Primary Care Provider: [name] Other Clinician: Referring Provider: Treating Provider/Extender: Jeanice Lim Weeks in Treatment: 1 Debridement Performed for Assessment: Wound #1 Left Calcaneus Performed By: Clinician Shawn Stall, RN Debridement Type: Chemical/Enzymatic/Mechanical Agent Used: Santyl Level of Consciousness (Pre-procedure): Awake and Alert Pre-procedure Verification/Time Out No Taken: Bleeding: None Hemostasis Achieved: Pressure Response to Treatment: Procedure was tolerated well Level of Consciousness (Post- Awake and Alert procedure): Post Debridement Measurements of Total Wound Length: (cm) 5 Stage: Unstageable/Unclassified Width: (cm) 7 Depth: (cm) 0.3 Volume: (cm) 8.247 Character of Wound/Ulcer Post Debridement: Requires Further Debridement Post Procedure Diagnosis Same as Pre-procedure Electronic  Signature(s) Signed: 01/15/2021 10:02:54 AM By: Geralyn Corwin DO Signed: 01/15/2021 5:07:29 PM By: Shawn Stall RN, BSN Entered By: Shawn Stall on 01/15/2021 09:38:24 -------------------------------------------------------------------------------- HPI Details Patient Name: Date of Service: Greg [name], [name] 01/15/2021 8:45 A M Medical Record Number: [id number] Patient Account Number: [id number] Date of Birth/Sex: Treating RN: 11-28-1931 (87 y.o. [name] Primary Care Provider: [name] Other Clinician: Referring Provider: Treating Provider/Extender: Dyke Maes in Treatment: 1 History of Present Illness HPI Description: ADMISSION 01/04/2021 [name] is a pleasant 81 year old woman who lives at [facility] [place] in the skilled facility. He is accompanied by his wife. I did previously been notified by Dr. Chales Abrahams primary physician about this patient. He apparently fractured his right acetabulum in July. Sometime in the recovery of this developed a pressure ulcer on the right heel. When I was first contacted by Dr. Chales Abrahams this was described as a necrotic wound and indeed this is an accurate description. Unfortunately cultures of this have shown MRSA and after 1 negative x-ray a subsequent x-ray on 01/02/2021 showed changes of osteomyelitis. The patient was started on IV vancomycin at the facility and I believe is also on oral trimethoprim/sulfamethoxazole. They are using silver alginate which was done at my suggestion. Sedimentation rate and the said facility was 55 CRP P was also elevated at 73.7 compatible with his osteomyelitis Past medical history includes a right acetabular fracture, DVT in the right femoral vein also in September on Eliquis, fairly severe Parkinson's disease. He has had [facility] in the skilled facility. ABI in our clinic was noncompressible on the left 10/11; patient presents for follow-up. He has no issues or complaints  today. He saw Dr. Daiva Eves on 10/7. He is currently on IV vancomycin. He currently denies systemic signs of infection. Electronic Signature(s) Signed: 01/15/2021 10:02:54 AM By: Geralyn Corwin DO Entered By: Geralyn Corwin on 01/15/2021 09:59:21 -------------------------------------------------------------------------------- Physical Exam Details Patient Name: Date of Service: Greg [name], [name] 01/15/2021 8:45 A M Medical Record Number: [id number] Patient Account Number: [id number] Date of Birth/Sex: Treating  RN: April 04, 1932 (87 y.o. [name] Primary Care Provider: [name] Other Clinician: Referring Provider: Treating Provider/Extender: Jeanice Lim Weeks in Treatment: 1 Constitutional respirations regular, non-labored and within target range for patient.. Cardiovascular 2+ dorsalis pedis/posterior tibialis pulses. Psychiatric pleasant and cooperative. Notes Left heel with Nonviable surface. This overlies bone. No surrounding erythema, increased warmth or purulent drainage Electronic Signature(s) Signed: 01/15/2021 10:02:54 AM By: Geralyn Corwin DO Entered By: Geralyn Corwin on 01/15/2021 10:00:34 -------------------------------------------------------------------------------- Physician Orders Details Patient Name: Date of Service: Greg [name], [name] 01/15/2021 8:45 A M Medical Record Number: [id number] Patient Account Number: [id number] Date of Birth/Sex: Treating RN: Nov 07, 1931 (87 y.o. [name] Primary Care Provider: [name] Other Clinician: Referring Provider: Treating Provider/Extender: Dyke Maes in Treatment: 1 Verbal / Phone Orders: No Diagnosis Coding ICD-10 Coding Code Description L89.620 Pressure ulcer of left heel, unstageable M86.172 Other acute osteomyelitis, left ankle and foot B95.62 Methicillin resistant Staphylococcus aureus infection as the cause of diseases classified  elsewhere Follow-up Appointments ppointment in 1 week. - Dr. Leanord Hawking Tuesday Return A ***Call [facility] to schedule arterial study test*** Bathing/ Shower/ Hygiene May shower with protection but do not get wound dressing(s) wet. Edema Control - Lymphedema / SCD / Other Elevate legs to the level of the heart or above for 30 minutes daily and/or when sitting, a frequency of: - throughout the day Off-Loading Turn and reposition every 2 hours Other: - Float heels off of bed/chair with pillow under calves Wound Treatment Wound #1 - Calcaneus Wound Laterality: Left Cleanser: Wound Cleanser 1 x Per Day/15 Days Discharge Instructions: Cleanse the wound with wound cleanser prior to applying a clean dressing using gauze sponges, not tissue or cotton balls. Prim Dressing: KerraCel Ag Gelling Fiber Dressing, 4x5 in (silver alginate) 1 x Per Day/15 Days ary Discharge Instructions: Apply silver alginate to wound bed, over Santyl Prim Dressing: Santyl Ointment 1 x Per Day/15 Days ary Discharge Instructions: Apply nickel thick amount to wound bed, under silver alginate Secondary Dressing: Woven Gauze Sponge, Non-Sterile 4x4 in 1 x Per Day/15 Days Discharge Instructions: Apply over primary dressing as directed. Secondary Dressing: ALLEVYN Heel 4 1/2in x 5 1/2in / 10.5cm x 13.5cm 1 x Per Day/15 Days Discharge Instructions: Apply over primary dressing as directed. Secured With: [employer], 4.5x3.1 (in/yd) 1 x Per Day/15 Days Discharge Instructions: Secure with Kerlix as directed. Secured With: Paper Tape, 2x10 (in/yd) 1 x Per Day/15 Days Discharge Instructions: Secure dressing with tape as directed. Electronic Signature(s) Signed: 01/15/2021 10:02:54 AM By: Geralyn Corwin DO Entered By: Geralyn Corwin on 01/15/2021 10:00:48 -------------------------------------------------------------------------------- Problem List Details Patient Name: Date of Service: Greg [name], [name]  01/15/2021 8:45 A M Medical Record Number: [id number] Patient Account Number: [id number] Date of Birth/Sex: Treating RN: May 31, 1931 (87 y.o. [name] Primary Care Provider: [name] Other Clinician: Referring Provider: Treating Provider/Extender: Jeanice Lim Weeks in Treatment: 1 Active Problems ICD-10 Encounter Code Description Active Date MDM Diagnosis L89.620 Pressure ulcer of left heel, unstageable 01/04/2021 No Yes M86.172 Other acute osteomyelitis, left ankle and foot 01/04/2021 No Yes B95.62 Methicillin resistant Staphylococcus aureus infection as the cause of diseases 01/04/2021 No Yes classified elsewhere Inactive Problems Resolved Problems Electronic Signature(s) Signed: 01/15/2021 10:02:54 AM By: Geralyn Corwin DO Entered By: Geralyn Corwin on 01/15/2021 09:57:53 -------------------------------------------------------------------------------- Progress Note Details Patient Name: Date of Service: Greg [name], [name] 01/15/2021 8:45 A M Medical Record Number: [id number] Patient Account Number: [id number] Date  of Birth/Sex: Treating RN: 1932/03/13 (87 y.o. [name] Primary Care Provider: [name] Other Clinician: Referring Provider: Treating Provider/Extender: Dyke Maes in Treatment: 1 Subjective Chief Complaint Information obtained from Patient 01/04/2021; patient is here for review of a unstageable pressure ulcer on the left heel History of Present Illness (HPI) ADMISSION 01/04/2021 Mr. Argabright is a pleasant 55 year old woman who lives at [facility] [place] in the skilled facility. He is accompanied by his wife. I did previously been notified by Dr. Chales Abrahams primary physician about this patient. He apparently fractured his right acetabulum in July. Sometime in the recovery of this developed a pressure ulcer on the right heel. When I was first contacted by Dr. Chales Abrahams this was described as a necrotic wound  and indeed this is an accurate description. Unfortunately cultures of this have shown MRSA and after 1 negative x-ray a subsequent x-ray on 01/02/2021 showed changes of osteomyelitis. The patient was started on IV vancomycin at the facility and I believe is also on oral trimethoprim/sulfamethoxazole. They are using silver alginate which was done at my suggestion. Sedimentation rate and the said facility was 55 CRP P was also elevated at 73.7 compatible with his osteomyelitis Past medical history includes a right acetabular fracture, DVT in the right femoral vein also in September on Eliquis, fairly severe Parkinson's disease. He has had [facility] [place] in the skilled facility. ABI in our clinic was noncompressible on the left 10/11; patient presents for follow-up. He has no issues or complaints today. He saw Dr. Daiva Eves on 10/7. He is currently on IV vancomycin. He currently denies systemic signs of infection. Patient History Information obtained from Patient. Family History Unknown History. Social History Never smoker, Marital Status - Married, Alcohol Use - Never, Drug Use - No History, Caffeine Use - Rarely. Medical History Cardiovascular Patient has history of Hypertension Musculoskeletal Patient has history of Osteoarthritis Medical A Surgical History Notes nd Cardiovascular Hyperlipdemia Objective Constitutional respirations regular, non-labored and within target range for patient.. Vitals Time Taken: 8:48 AM, Height: 73 in, Weight: 180 lbs, BMI: 23.7, Temperature: 98.4 F, Pulse: 84 bpm, Respiratory Rate: 18 breaths/min, Blood Pressure: 121/70 mmHg. Cardiovascular 2+ dorsalis pedis/posterior tibialis pulses. Psychiatric pleasant and cooperative. General Notes: Left heel with Nonviable surface. This overlies bone. No surrounding erythema, increased warmth or purulent drainage Integumentary (Hair, Skin) Wound #1 status is Open. Original cause of wound was Blister. The date  acquired was: 10/24/2020. The wound has been in treatment 1 weeks. The wound is located on the Left Calcaneus. The wound measures 5cm length x 7cm width x 0.3cm depth; 27.489cm^2 area and 8.247cm^3 volume. There is no tunneling or undermining noted. There is a medium amount of serosanguineous drainage noted. Foul odor after cleansing was noted. The wound margin is distinct with the outline attached to the wound base. There is a large (67-100%) amount of necrotic tissue within the wound bed including Eschar and Adherent Slough. Assessment Active Problems ICD-10 Pressure ulcer of left heel, unstageable Other acute osteomyelitis, left ankle and foot Methicillin resistant Staphylococcus aureus infection as the cause of diseases classified elsewhere Patient's wound looks slightly cleaner from last clinic visit. There is nonviable tissue throughout. I recommended continuing Santyl with silver alginate. Also recommended aggressive offloading to the heel. Patient saw Dr. Daiva Eves on 10/7 and he recommended amputation. Since patient is not ready for this he is continued on IV vancomycin. He has not obtain his ABIs with TBI's. I recommended he follow-up and  obtain these. They have already called him to schedule these. Procedures Wound #1 Pre-procedure diagnosis of Wound #1 is a Pressure Ulcer located on the Left Calcaneus . There was a Chemical/Enzymatic/Mechanical debridement performed by Shawn Stall, RN.Marland Kitchen Agent used was [employer]. There was no bleeding. The procedure was tolerated well. Post Debridement Measurements: 5cm length x 7cm width x 0.3cm depth; 8.247cm^3 volume. Post debridement Stage noted as Unstageable/Unclassified. Character of Wound/Ulcer Post Debridement requires further debridement. Post procedure Diagnosis Wound #1: Same as Pre-Procedure Plan Follow-up Appointments: Return Appointment in 1 week. - Dr. Leanord Hawking Tuesday ***Call [facility] to schedule arterial study test*** Bathing/  Shower/ Hygiene: May shower with protection but do not get wound dressing(s) wet. Edema Control - Lymphedema / SCD / Other: Elevate legs to the level of the heart or above for 30 minutes daily and/or when sitting, a frequency of: - throughout the day Off-Loading: Turn and reposition every 2 hours Other: - Float heels off of bed/chair with pillow under calves WOUND #1: - Calcaneus Wound Laterality: Left Cleanser: Wound Cleanser 1 x Per Day/15 Days Discharge Instructions: Cleanse the wound with wound cleanser prior to applying a clean dressing using gauze sponges, not tissue or cotton balls. Prim Dressing: KerraCel Ag Gelling Fiber Dressing, 4x5 in (silver alginate) 1 x Per Day/15 Days ary Discharge Instructions: Apply silver alginate to wound bed, over Santyl Prim Dressing: Santyl Ointment 1 x Per Day/15 Days ary Discharge Instructions: Apply nickel thick amount to wound bed, under silver alginate Secondary Dressing: Woven Gauze Sponge, Non-Sterile 4x4 in 1 x Per Day/15 Days Discharge Instructions: Apply over primary dressing as directed. Secondary Dressing: ALLEVYN Heel 4 1/2in x 5 1/2in / 10.5cm x 13.5cm 1 x Per Day/15 Days Discharge Instructions: Apply over primary dressing as directed. Secured With: [employer], 4.5x3.1 (in/yd) 1 x Per Day/15 Days Discharge Instructions: Secure with Kerlix as directed. Secured With: Paper T ape, 2x10 (in/yd) 1 x Per Day/15 Days Discharge Instructions: Secure dressing with tape as directed. 1. Santyl with silver alginate 2. Aggressive offloading 3. Obtain ABIs with TBI's 4. Follow-up in 1 week Electronic Signature(s) Signed: 01/15/2021 10:02:54 AM By: Geralyn Corwin DO Entered By: Geralyn Corwin on 01/15/2021 10:02:19 -------------------------------------------------------------------------------- HxROS Details Patient Name: Date of Service: Greg [name], [name] 01/15/2021 8:45 A M Medical Record Number: [id number] Patient Account Number:  [id number] Date of Birth/Sex: Treating RN: 1931-12-16 (87 y.o. [name] Primary Care Provider: [name] Other Clinician: Referring Provider: Treating Provider/Extender: Dyke Maes in Treatment: 1 Information Obtained From Patient Cardiovascular Medical History: Positive for: Hypertension Past Medical History Notes: Hyperlipdemia Musculoskeletal Medical History: Positive for: Osteoarthritis Immunizations Pneumococcal Vaccine: Received Pneumococcal Vaccination: Yes Received Pneumococcal Vaccination On or After 60th Birthday: Yes Implantable Devices None Family and Social History Unknown History: Yes; Never smoker; Marital Status - Married; Alcohol Use: Never; Drug Use: No History; Caffeine Use: Rarely; Financial Concerns: No; Food, Clothing or Shelter Needs: No; Support System Lacking: No; Transportation Concerns: No Electronic Signature(s) Signed: 01/15/2021 10:02:54 AM By: Geralyn Corwin DO Signed: 01/15/2021 5:07:29 PM By: Shawn Stall RN, BSN Entered By: Geralyn Corwin on 01/15/2021 09:59:27 -------------------------------------------------------------------------------- SuperBill Details Patient Name: Date of Service: Greg [name], [name] 01/15/2021 Medical Record Number: [id number] Patient Account Number: [id number] Date of Birth/Sex: Treating RN: 12-31-1931 (87 y.o. [name] Primary Care Provider: [name] Other Clinician: Referring Provider: Treating Provider/Extender: Jeanice Lim Weeks in Treatment: 1 Diagnosis Coding ICD-10 Codes Code Description [phone] Pressure ulcer of  left heel, unstageable M86.172 Other acute osteomyelitis, left ankle and foot B95.62 Methicillin resistant Staphylococcus aureus infection as the cause of diseases classified elsewhere Facility Procedures CPT4 Code: [id number] Description: [phone] - DEBRIDE W/O ANES NON SELECT Modifier: Quantity: 1 Physician Procedures :  CPT4 Code Description Modifier [id number] - WC PHYS LEVEL 3 - EST PT ICD-10 Diagnosis Description L89.620 Pressure ulcer of left heel, unstageable M86.172 Other acute osteomyelitis, left ankle and foot B95.62 Methicillin resistant Staphylococcus  aureus infection as the cause of diseases classified elsewhere Quantity: 1 Electronic Signature(s) Signed: 01/15/2021 10:02:54 AM By: Geralyn Corwin DO Entered By: Geralyn Corwin on 01/15/2021 10:02:31

## 2021-01-17 ENCOUNTER — Non-Acute Institutional Stay (SKILLED_NURSING_FACILITY): Payer: Medicare Other | Admitting: Internal Medicine

## 2021-01-17 ENCOUNTER — Encounter (HOSPITAL_COMMUNITY): Payer: Medicare Other

## 2021-01-17 ENCOUNTER — Other Ambulatory Visit (HOSPITAL_COMMUNITY): Payer: Self-pay | Admitting: Internal Medicine

## 2021-01-17 ENCOUNTER — Encounter: Payer: Self-pay | Admitting: Internal Medicine

## 2021-01-17 DIAGNOSIS — M86272 Subacute osteomyelitis, left ankle and foot: Secondary | ICD-10-CM

## 2021-01-17 DIAGNOSIS — R609 Edema, unspecified: Secondary | ICD-10-CM

## 2021-01-17 DIAGNOSIS — G20 Parkinson's disease: Secondary | ICD-10-CM

## 2021-01-17 DIAGNOSIS — S91302A Unspecified open wound, left foot, initial encounter: Secondary | ICD-10-CM

## 2021-01-17 DIAGNOSIS — I82411 Acute embolism and thrombosis of right femoral vein: Secondary | ICD-10-CM

## 2021-01-17 DIAGNOSIS — D638 Anemia in other chronic diseases classified elsewhere: Secondary | ICD-10-CM

## 2021-01-17 DIAGNOSIS — B999 Unspecified infectious disease: Secondary | ICD-10-CM

## 2021-01-17 LAB — C-REACTIVE PROTEIN: CRP: 16.8

## 2021-01-17 NOTE — Progress Notes (Signed)
Location:   [facility] Room Number: 27 Place of Service:  SNF [phone]) Provider:  Veleta Miners MD  Virgie Dad, MD  Patient Care Team: Virgie Dad, MD as PCP - General (Internal Medicine) [name], Eustace Quail, DO as Consulting Physician (Neurology)  Extended Emergency Contact Information Primary Emergency Contact: Vergia Alcon Address: [place].          apt [phone] Johnnette Litter of [place] Phone: [phone] Mobile Phone: [phone] Relation: Spouse Secondary Emergency Contact: norton, bivins Mobile Phone: [phone] Relation: Son  Code Status:  Full Code Goals of care: Advanced Directive information Advanced Directives 01/17/2021  Does Patient Have a Medical Advance Directive? Yes  Type of Advance Directive Living will  Does patient want to make changes to medical advance directive? No - Patient declined  Would patient like information on creating a medical advance directive? -     Chief Complaint  Patient presents with   Medical Management of Chronic Issues   Quality Metric Gaps    TDAP, Shingrix, Covid and flu shot     HPI:  Pt is a 87 y.o. male seen today for medical management of chronic diseases.    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  MRI of the cervical spine that same date demonstrating multilevel degenerative changes, most pronounced at C3-C4.   Also has h/o Osteoporosis , Vit D def Has been on Fosamax before, BPH, Arthritis,    Admitted in Hospital from 7/4-7/8 for Commuted Right Acetabulum Fracture ORIF on 7/06   Followed by Acute DVT of Right Leg now on Eliquis  Patient had DTI in his Left Heel which Opened up and he  developed discharge and Odor from the wound Was diagnosed with osteomyelitis Positive culture for MRSA Now on IV Vanco per ID since 9/28 Also Seeing Wound Clinic  Wound slightly better. With no Odor Less Discharge Continues to do well and able to walk now with therapy and his walker. No Pain Only c/o Swelling in that Leg Left more then right . No Cough or SOB Appetite is good. Weight is up by 6-7 lbs  Past Medical History:  Diagnosis Date   Acute bronchiolitis    Acute osteomyelitis of calcaneum, left ([place]) 01/11/2021   Anemia    Bursitis of both hips    Hearing reduced    hearing aid   History of basal cell carcinoma    Hyperlipidemia    Malaise and fatigue    MRSA infection 01/11/2021   Osteoarthritis    hand   Osteoporosis    Palpitation    Vitamin D deficiency    Past Surgical History:  Procedure Laterality Date   CATARACT EXTRACTION  05/31/2015   Eye/right   COLONOSCOPY  02/05/2005, 03/06/2005   normal, 10 years ago   MOHS micrographic surgery     Face surgery   ORIF ACETABULAR FRACTURE Right 10/09/2020   Procedure: OPEN REDUCTION INTERNAL FIXATION (ORIF) ACETABULAR FRACTURE;  Surgeon: Altamese Mount Carmel, MD;  Location: [facility];  Service: Orthopedics;  Laterality: Right;    No Known Allergies  Allergies as of 01/17/2021   No Known Allergies      Medication List  Accurate as of January 17, 2021 11:59 PM. If you have any questions, ask your nurse or doctor.          STOP taking these medications    potassium chloride SA 20 MEQ tablet Commonly known as: KLOR-CON Stopped by: Virgie Dad, MD   sulfamethoxazole-trimethoprim 800-160 MG tablet Commonly known as: BACTRIM DS Stopped by: Virgie Dad, MD   torsemide 20 MG tablet Commonly known as: [place] by: Virgie Dad, MD       TAKE these medications    acetaminophen 325 MG tablet Commonly known as: TYLENOL Take 1-2 tablets (325-650 mg total) by mouth every 6 (six) hours as needed  for mild pain (pain score 1-3 or temp > 100.5).   apixaban 5 MG Tabs tablet Commonly known as: ELIQUIS Take 1 tablet (5 mg total) by mouth 2 (two) times daily.   CALCIUM-VITAMIN D3 PO Take 600 mg by mouth in the morning.   carbidopa-levodopa 25-100 MG tablet Commonly known as: SINEMET IR Take 1 tablet by mouth 4 (four) times daily.   carbidopa-levodopa 50-200 MG tablet Commonly known as: SINEMET CR Take 1 tablet by mouth at bedtime.   feeding supplement (PRO-STAT SUGAR FREE 64) Liqd Take 30 mLs by mouth in the morning and at bedtime.   heparin flush 10 UNIT/ML Soln injection Inject 10 Units into the vein 2 (two) times daily. Flush with 5 mL of Heparin 10u/mL after Antibiotic administration and saline flush of 10 mL.   polyethylene glycol 17 g packet Commonly known as: MiraLax Take 17 g by mouth daily.   senna 8.6 MG Tabs tablet Commonly known as: SENOKOT Take 1 tablet (8.6 mg total) by mouth daily.   sodium chloride irrigation 0.9 % irrigation Irrigate with as directed 2 (two) times daily. Flush before Antibiotic PICC Administration with 10cc of NS   vancomycin 750 mg in sodium chloride 0.9 % 250 mL Inject 750 mg into the vein every 12 (twelve) hours.   zinc oxide 20 % ointment Apply 1 application topically as needed for irritation.        Review of Systems  Constitutional:  Positive for unexpected weight change.  HENT: Negative.    Respiratory: Negative.    Cardiovascular:  Positive for leg swelling.  Gastrointestinal: Negative.   Genitourinary: Negative.   Musculoskeletal:  Positive for gait problem.  Skin:  Positive for wound.  Neurological:  Negative for dizziness.  Psychiatric/Behavioral: Negative.     Immunization History  Administered Date(s) Administered   H1N1 06/13/2008   Influenza Split 03/05/2006, 02/26/2007, 01/26/2008, 01/18/2009, 12/12/2009, 01/16/2011, 01/29/2012, 02/01/2013, 02/08/2014, 02/13/2015, 01/10/2016, 02/23/2017, 02/24/2018,  01/24/2019, 12/22/2019   PFIZER(Purple Top)SARS-COV-2 Vaccination 05/04/2019, 05/25/2019, 09/01/2020   Pfizer Covid-19 Vaccine Bivalent Booster 43yr & up 12/27/2019, 12/26/2020   Pneumococcal Conjugate-13 02/10/2014   Pneumococcal Polysaccharide-23 06/10/2011   Zoster, Live 07/04/2009   Pertinent  Health Maintenance Due  Topic Date Due   INFLUENZA VACCINE  11/05/2020   Fall Risk  01/10/2021 09/26/2020  Falls in the past year? 1 0  Number falls in past yr: 0 0  Injury with Fall? 1 0   Functional Status Survey:    Vitals:   01/17/21 1042  BP: 138/71  Pulse: 75  Resp: 20  Temp: (!) 97.3 F (36.3 C)  SpO2: 94%  Weight: 185 lb 9.6 oz (84.2 kg)  Height: '5\' 9"'  (1.753 m)   Body mass index is 27.41 kg/m. Physical Exam Constitutional: Oriented to person, place, and time. Well-developed and  well-nourished.  HENT:  Head: Normocephalic.  Mouth/Throat: Oropharynx is clear and moist.  Eyes: Pupils are equal, round, and reactive to light.  Neck: Neck supple.  Cardiovascular: Normal rate and normal heart sounds.  No murmur heard. Pulmonary/Chest: Effort normal and breath sounds normal. No respiratory distress. No wheezes. She has no rales.  Abdominal: Soft. Bowel sounds are normal. No distension. There is no tenderness. There is no rebound.  Musculoskeletal: Swelling Left more then right has minimal Wound in left Heel 4-5 cm  Continues to have Necrosis and Slough But no Odor or Discharge. Not Tender. Lymphadenopathy: none Neurological: Alert and oriented to person, place, and time.  Now walking with this walker and therapy Skin: Skin is warm and dry.  Psychiatric: Normal mood and affect. Behavior is normal. Thought content normal.   Labs reviewed: Recent Labs    10/09/20 0546 10/09/20 0937 10/10/20 0607 10/11/20 0037 10/12/20 0146 10/25/20 0000 01/08/21 0000 01/12/21 0000 01/15/21 0000  NA  --    < > 131* 129* 132*   < > 136* 135* 137  K  --    < > 4.2 4.5 4.1   < > 4.8  5.0 4.4  CL  --    < > 97* 96* 97*   < > 104 103 104  CO2  --    < > '28 29 28   ' < > 24* 27* 23*  GLUCOSE  --    < > 134* 114* 122*  --   --   --   --   BUN  --    < > 23 26* 24*   < > 35* 29* 24*  CREATININE  --    < > 1.20 1.14 1.14   < > 1.4* 1.3 1.3  CALCIUM  --    < > 8.7* 8.6* 8.4*   < > 8.6* 8.5* 8.5*  MG  --   --  2.1  --   --   --   --   --   --   PHOS 3.4  --   --   --  3.5  --   --   --   --    < > = values in this interval not displayed.   Recent Labs    10/09/20 0937 10/11/20 0037 10/12/20 0146 12/26/20 0000 12/27/20 0000  AST 22 34  --  19 18  ALT 7 <5  --  9* 6*  ALKPHOS 31* 24*  --  55 46  BILITOT 1.7* 1.1  --   --   --   PROT 5.9* 4.6*  --   --   --   ALBUMIN 3.4* 2.6* 2.6* 3.7 2.9*   Recent Labs    10/10/20 0607 10/11/20 0037 10/12/20 0146 10/12/20 0146 10/25/20 0000 11/19/20 0000 12/27/20 0000 01/15/21 0000  WBC 13.8* 10.1 8.5   < > 7.1 6.2 7.8 7.0  NEUTROABS  --   --  6.4  --  5,013.00  --  5,694.00 4,515.00  HGB 8.7* 7.4* 7.8*  --  8.9* 9.9* 10.1* 10.3*  HCT 25.3* 21.8* 22.8*  --  27* 31* 31* 33*  MCV 96.2 96.9 96.6  --   --   --   --   --   PLT 93* 90* 116*  --  293 206 283 267   < > = values in this interval not displayed.   No results found for: TSH Lab Results  Component Value Date   HGBA1C  5.9 (H) 10/08/2020   No results found for: CHOL, HDL, LDLCALC, LDLDIRECT, TRIG, CHOLHDL  Significant Diagnostic Results in last 30 days:  IR PICC PLACEMENT RIGHT >5 YRS INC IMG GUIDE  Result Date: 01/14/2021 INDICATION: Calcaneal osteomyelitis, in need of long-term IV antibiotic treatment. Request for PICC line placement. EXAM: ULTRASOUND AND FLUOROSCOPIC GUIDED PICC LINE INSERTION MEDICATIONS: 1% lidocaine CONTRAST:  None FLUOROSCOPY TIME:  24 seconds (1 mGy) COMPLICATIONS: None immediate. TECHNIQUE: The procedure, risks, benefits, and alternatives were explained to the patient and informed written consent was obtained. The right upper extremity was  prepped with chlorhexidine in a sterile fashion, and a sterile drape was applied covering the operative field. Maximum barrier sterile technique with sterile gowns and gloves were used for the procedure. A timeout was performed prior to the initiation of the procedure. Local anesthesia was provided with 1% lidocaine. After the overlying soft tissues were anesthetized with 1% lidocaine, a micropuncture kit was utilized to access the right basilic vein. Real-time ultrasound guidance was utilized for vascular access including the acquisition of a permanent ultrasound image documenting patency of the accessed vessel. A guidewire was advanced to the level of the superior caval-atrial junction for measurement purposes and the PICC line was cut to length. A peel-away sheath was placed and a 43 cm, 5 [place], single lumen was inserted to level of the superior caval-atrial junction. A post procedure spot fluoroscopic was obtained. The catheter easily aspirated and flushed and was secured in place. A dressing was placed. The patient tolerated the procedure well without immediate post procedural complication. FINDINGS: After catheter placement, the tip lies within the superior cavoatrial junction. The catheter aspirates and flushes normally and is ready for immediate use. IMPRESSION: Successful ultrasound and fluoroscopic guided placement of a right basilic vein approach, 43 cm, 5 French, single lumen PICC with tip at the superior caval-atrial junction. The PICC line is ready for immediate use. Read by: Durenda Guthrie, PA-C Electronically Signed   By: Sandi Mariscal M.D.   On: 01/14/2021 14:57    Assessment/Plan Subacute osteomyelitis of left Calcaneous [facility]) Now has PICC Line with IV Vanco Following With ID and Wound Clinic Also Per Dr Dellia Nims Plan for Vascular studies to evaluate for PAD next week. Again discussed with wife and son that As suggested per ID BKA would be the Curative option.  Other option is to continue IV Vanco  for now followed by PO Bactrim Continue debridement by Wound care Wife and son want to continue this for now.  Doing Biweekly follow with BMP and CRP  DVT of deep femoral vein, right ([place]) Diagnosed on 9/16. 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

## 2021-01-21 ENCOUNTER — Ambulatory Visit (HOSPITAL_COMMUNITY)
Admission: RE | Admit: 2021-01-21 | Discharge: 2021-01-21 | Disposition: A | Discharge status: Home / Self Care | Payer: Medicare Other | Source: Ambulatory Visit | Attending: Internal Medicine | Admitting: Internal Medicine

## 2021-01-21 ENCOUNTER — Other Ambulatory Visit: Payer: Self-pay

## 2021-01-21 ENCOUNTER — Ambulatory Visit (INDEPENDENT_AMBULATORY_CARE_PROVIDER_SITE_OTHER)
Admission: RE | Admit: 2021-01-21 | Discharge: 2021-01-21 | Disposition: A | Discharge status: Home / Self Care | Payer: Medicare Other | Source: Ambulatory Visit | Attending: Internal Medicine | Admitting: Internal Medicine

## 2021-01-21 DIAGNOSIS — S91302A Unspecified open wound, left foot, initial encounter: Secondary | ICD-10-CM

## 2021-01-22 ENCOUNTER — Encounter (HOSPITAL_BASED_OUTPATIENT_CLINIC_OR_DEPARTMENT_OTHER): Payer: Medicare Other | Admitting: Internal Medicine

## 2021-01-22 DIAGNOSIS — L89620 Pressure ulcer of left heel, unstageable: Secondary | ICD-10-CM | POA: Diagnosis not present

## 2021-01-22 NOTE — Progress Notes (Signed)
JAMAI DOLCE ([id number]) , Visit Report for 01/22/2021 Debridement Details Patient Name: Date of Service: Fredric Mare [facility] 01/22/2021 10:30 A M Medical Record Number: [id number] Patient Account Number: [id number] Date of Birth/Sex: Treating RN: Mar 06, 1932 (87 y.o. [name], [name] Primary Care Provider: [name] Other Clinician: Referring Provider: Treating Provider/Extender: Allen Norris in Treatment: 2 Debridement Performed for Assessment: Wound #1 Left Calcaneus Performed By: Physician Maxwell Caul., MD Debridement Type: Debridement Level of Consciousness (Pre-procedure): Awake and Alert Pre-procedure Verification/Time Out Yes - 11:50 Taken: Start Time: 11:51 Pain Control: Other : benzocaine 20% T Area Debrided (L x W): otal 5 (cm) x 5 (cm) = 25 (cm) Tissue and other material debrided: Non-Viable, Eschar, Subcutaneous Level: Skin/Subcutaneous Tissue Debridement Description: Excisional Instrument: Blade, Forceps Bleeding: Minimum Hemostasis Achieved: Pressure End Time: 11:56 Procedural Pain: 0 Post Procedural Pain: 0 Response to Treatment: Procedure was tolerated well Level of Consciousness (Post- Awake and Alert procedure): Post Debridement Measurements of Total Wound Length: (cm) 5.5 Stage: Unstageable/Unclassified Width: (cm) 6.1 Depth: (cm) 0.3 Volume: (cm) 7.905 Character of Wound/Ulcer Post Debridement: Requires Further Debridement Post Procedure Diagnosis Same as Pre-procedure Electronic Signature(s) Signed: 01/22/2021 4:50:06 PM By: Fonnie Mu RN Signed: 01/22/2021 4:56:56 PM By: Baltazar Najjar MD Entered By: Baltazar Najjar on 01/22/2021 12:19:30 -------------------------------------------------------------------------------- HPI Details Patient Name: Date of Service: [name], [name] 01/22/2021 10:30 A M Medical Record Number: [id number] Patient Account Number: [id number] Date of Birth/Sex: Treating  RN: 06-28-1931 (87 y.o. [name], [name] Primary Care Provider: [name] Other Clinician: Referring Provider: Treating Provider/Extender: Allen Norris in Treatment: 2 History of Present Illness HPI Description: ADMISSION 01/04/2021 Mr. Ardoin is a pleasant 87 year old woman who lives at [facility] [place] in the skilled facility. He is accompanied by his wife. I did previously been notified by Dr. Chales Abrahams primary physician about this patient. He apparently fractured his right acetabulum in July. Sometime in the recovery of this developed a pressure ulcer on the right heel. When I was first contacted by Dr. Chales Abrahams this was described as a necrotic wound and indeed this is an accurate description. Unfortunately cultures of this have shown MRSA and after 1 negative x-ray a subsequent x-ray on 01/02/2021 showed changes of osteomyelitis. The patient was started on IV vancomycin at the facility and I believe is also on oral trimethoprim/sulfamethoxazole. They are using silver alginate which was done at my suggestion. Sedimentation rate and the said facility was 55 CRP P was also elevated at 73.7 compatible with his osteomyelitis Past medical history includes a right acetabular fracture, DVT in the right femoral vein also in September on Eliquis, fairly severe Parkinson's disease. [facility] has had [facility] in the skilled facility. ABI in our clinic was noncompressible on the left 10/11; patient presents for follow-up. He has no issues or complaints today. He saw Dr. Daiva Eves on 10/7. He is currently on IV vancomycin. He currently denies systemic signs of infection. 10/18 remains on IV vancomycin. Santyl and silver alginate to the wound bed. He is at the [facility] home section [facility] [place]. His wound has made some minor progress there is not as much necrotic tissue and some viable tissue on the surface however there is still an odor. The patient had noninvasive  arterial tests done at my suggestion. On the left his ABI was noncompressible his TBI was 0.37 with monophasic and biphasic waveforms. Arterial Doppler showed monophasic waveforms at the ATA and PTA. However there was  no focally hemodynamic significant stenosis identified. 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 [name], [name] Primary Care Provider: [name] Other Clinician: Referring Provider: Treating Provider/Extender: Allen Norris in Treatment: 2 Constitutional Sitting or standing Blood Pressure is within target range for patient.. Pulse regular and within target range for patient.Marland Kitchen Respirations regular, non-labored and within target range.. Temperature is normal and within the target range for the patient.Marland Kitchen Appears in no distress. Notes Wound exam; left heel most of this had nonviable tissue. I used a #15 scalpel and pickups to remove some of this. He still does not have much exposed bone but nor is there much viable tissue on the surface of this. There is still a notable odor but all in all the surface of the wound looks some better than when he came in here. At that time the surface was 100% necrotic, mushy Electronic Signature(s) Signed: 01/22/2021 4:56:56 PM By: Baltazar Najjar MD Entered By: Baltazar Najjar on 01/22/2021 12:17:25 -------------------------------------------------------------------------------- Physician Orders Details Patient Name: Date of Service: [name], [name] 01/22/2021 10:30 A M Medical Record  Number: [id number] Patient Account Number: [id number] Date of Birth/Sex: Treating RN: 1931-06-23 (87 y.o. [name] Primary Care Provider: [name] Other Clinician: Referring Provider: Treating Provider/Extender: Allen Norris in Treatment: 2 Verbal / Phone Orders: No Diagnosis Coding Follow-up Appointments ppointment in 2 weeks. - Dr. Leanord Hawking Tuesday Return A Dr. Leanord Hawking will speak with Dr. Chales Abrahams concerning vascular consultation. Bathing/ Shower/ Hygiene May shower with protection but do not get wound dressing(s) wet. Edema Control - Lymphedema / SCD / Other Elevate legs to the level of the heart or above for 30 minutes daily and/or when sitting, a frequency of: - throughout the day Off-Loading Turn and reposition every 2 hours Other: - Float heels off of bed/chair with pillow under calves Wound Treatment Wound #1 - Calcaneus Wound Laterality: Left Cleanser: Wound Cleanser 1 x Per Day/15 Days Discharge Instructions: Cleanse the wound with wound cleanser prior to applying a clean dressing using gauze sponges, not tissue or cotton balls. Prim Dressing: KerraCel Ag Gelling Fiber Dressing, 4x5 in (silver alginate) 1 x Per Day/15 Days ary Discharge Instructions: Apply silver alginate to wound bed, over Santyl Prim Dressing: Santyl Ointment 1 x Per Day/15 Days ary Discharge Instructions: Apply nickel thick amount to wound bed, under silver alginate Secondary Dressing: Woven Gauze Sponge, Non-Sterile 4x4 in 1 x Per Day/15 Days Discharge Instructions: Apply over primary dressing as directed. Secondary Dressing: ALLEVYN Heel 4 1/2in x 5 1/2in / 10.5cm x 13.5cm 1 x Per Day/15 Days Discharge Instructions: Apply over primary dressing as directed. Secured With: [employer], 4.5x3.1 (in/yd) 1 x Per Day/15 Days Discharge Instructions: Secure with Kerlix as directed. Secured With: Paper Tape, 2x10 (in/yd) 1 x Per Day/15 Days Discharge Instructions: Secure  dressing with tape as directed. Electronic Signature(s) Signed: 01/22/2021 4:56:56 PM By: Baltazar Najjar MD Signed: 01/22/2021 5:13:39 PM By: Shawn Stall RN, BSN Entered By: Shawn Stall on 01/22/2021 12:14:25 -------------------------------------------------------------------------------- Problem List Details Patient Name: Date of Service: [name], [name] 01/22/2021 10:30 A M Medical Record Number: [id number] Patient Account Number: [id number] Date of Birth/Sex: Treating RN: 16-Jan-1932 (  87 y.o. [name], [name] Primary Care Provider: [name] Other Clinician: Referring Provider: Treating Provider/Extender: Allen Norris in Treatment: 2 Active Problems ICD-10 Encounter Code Description Active Date MDM Diagnosis L89.620 Pressure ulcer of left heel, unstageable 01/04/2021 No Yes M86.172 Other acute osteomyelitis, left ankle and foot 01/04/2021 No Yes B95.62 Methicillin resistant Staphylococcus aureus infection as the cause of diseases 01/04/2021 No Yes classified elsewhere Inactive Problems Resolved Problems Electronic Signature(s) Signed: 01/22/2021 4:56:56 PM By: Baltazar Najjar MD Entered By: Baltazar Najjar on 01/22/2021 12:11:10 -------------------------------------------------------------------------------- Progress Note Details Patient Name: Date of Service: [name], [name] 01/22/2021 10:30 A M Medical Record Number: [id number] Patient Account Number: [id number] Date of Birth/Sex: Treating RN: 1932-02-22 (87 y.o. [name], [name] Primary Care Provider: [name] Other Clinician: Referring Provider: Treating Provider/Extender: Allen Norris in Treatment: 2 Subjective History of Present Illness (HPI) ADMISSION 01/04/2021 Mr. Mcadory is a pleasant 52 year old woman who lives at [facility] [place] in the skilled facility. He is accompanied by his wife. I did previously been notified by Dr. Chales Abrahams primary  physician about this patient. He apparently fractured his right acetabulum in July. Sometime in the recovery of this developed a pressure ulcer on the right heel. When I was first contacted by Dr. Chales Abrahams this was described as a necrotic wound and indeed this is an accurate description. Unfortunately cultures of this have shown MRSA and after 1 negative x-ray a subsequent x-ray on 01/02/2021 showed changes of osteomyelitis. The patient was started on IV vancomycin at the facility and I believe is also on oral trimethoprim/sulfamethoxazole. They are using silver alginate which was done at my suggestion. Sedimentation rate and the said facility was 55 CRP P was also elevated at 73.7 compatible with his osteomyelitis Past medical history includes a right acetabular fracture, DVT in the right femoral vein also in September on Eliquis, fairly severe Parkinson's disease. [facility] has had [facility] in the skilled facility. ABI in our clinic was noncompressible on the left 10/11; patient presents for follow-up. He has no issues or complaints today. He saw Dr. Daiva Eves on 10/7. He is currently on IV vancomycin. He currently denies systemic signs of infection. 10/18 remains on IV vancomycin. Santyl and silver alginate to the wound bed. He is at the [facility] home section [facility] [place]. His wound has made some minor progress there is not as much necrotic tissue and some viable tissue on the surface however there is still an odor. The patient had noninvasive arterial tests done at my suggestion. On the left his ABI was noncompressible his TBI was 0.37 with monophasic and biphasic waveforms. Arterial Doppler showed monophasic waveforms at the ATA and PTA. However there was no focally hemodynamic significant stenosis identified. 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 Pulse regular and within target  range for patient.Marland Kitchen Respirations regular, non-labored and within target range.. Temperature is normal and within the target range for the patient.Marland Kitchen Appears in no distress. Vitals Time Taken: 11:30 AM, Height: 73 in, Weight: 180 lbs, BMI: 23.7, Temperature: 98.5 F, Pulse: 85 bpm, Respiratory Rate: 16 breaths/min, Blood Pressure: 133/84 mmHg. General Notes: Wound exam; left heel most of this had nonviable tissue. I used a #15 scalpel and pickups to remove some of this. He still does not have much exposed bone but nor is there much viable tissue on the surface of this. There is still  a notable odor but all in all the surface of the wound looks some better than when he came in here. At that time the surface was 100% necrotic, mushy Integumentary (Hair, Skin) Wound #1 status is Open. Original cause of wound was Blister. The date acquired was: 10/24/2020. The wound has been in treatment 2 weeks. The wound is located on the Left Calcaneus. The wound measures 5.5cm length x 6.1cm width x 0.3cm depth; 26.35cm^2 area and 7.905cm^3 volume. There is no tunneling or undermining noted. There is a large amount of serosanguineous drainage noted. Foul odor after cleansing was noted. The wound margin is distinct with the outline attached to the wound base. There is no granulation within the wound bed. There is a large (67-100%) amount of necrotic tissue within the wound bed including Eschar and Adherent Slough. Assessment Active Problems ICD-10 Pressure ulcer of left heel, unstageable Other acute osteomyelitis, left ankle and foot Methicillin resistant Staphylococcus aureus infection as the cause of diseases classified elsewhere Procedures Wound #1 Pre-procedure diagnosis of Wound #1 is a Pressure Ulcer located on the Left Calcaneus . There was a Selective/Open Wound Non-Viable Tissue Debridement with a total area of 25 sq cm performed by Maxwell Caul., MD. With the following instrument(s): Blade, and  Forceps to remove Non-Viable tissue/material. Material removed includes Eschar after achieving pain control using Other (benzocaine 20%). A time out was conducted at 11:50, prior to the start of the procedure. A Minimum amount of bleeding was controlled with Pressure. The procedure was tolerated well with a pain level of 0 throughout and a pain level of 0 following the procedure. Post Debridement Measurements: 5.5cm length x 6.1cm width x 0.3cm depth; 7.905cm^3 volume. Post debridement Stage noted as Unstageable/Unclassified. Character of Wound/Ulcer Post Debridement requires further debridement. Post procedure Diagnosis Wound #1: Same as Pre-Procedure Plan Follow-up Appointments: Return Appointment in 2 weeks. - Dr. Leanord Hawking Tuesday Dr. Leanord Hawking will speak with Dr. Chales Abrahams concerning vascular consultation. Bathing/ Shower/ Hygiene: May shower with protection but do not get wound dressing(s) wet. Edema Control - Lymphedema / SCD / Other: Elevate legs to the level of the heart or above for 30 minutes daily and/or when sitting, a frequency of: - throughout the day Off-Loading: Turn and reposition every 2 hours Other: - Float heels off of bed/chair with pillow under calves WOUND #1: - Calcaneus Wound Laterality: Left Cleanser: Wound Cleanser 1 x Per Day/15 Days Discharge Instructions: Cleanse the wound with wound cleanser prior to applying a clean dressing using gauze sponges, not tissue or cotton balls. Prim Dressing: KerraCel Ag Gelling Fiber Dressing, 4x5 in (silver alginate) 1 x Per Day/15 Days ary Discharge Instructions: Apply silver alginate to wound bed, over Santyl Prim Dressing: Santyl Ointment 1 x Per Day/15 Days ary Discharge Instructions: Apply nickel thick amount to wound bed, under silver alginate Secondary Dressing: Woven Gauze Sponge, Non-Sterile 4x4 in 1 x Per Day/15 Days Discharge Instructions: Apply over primary dressing as directed. Secondary Dressing: ALLEVYN Heel 4 1/2in x 5  1/2in / 10.5cm x 13.5cm 1 x Per Day/15 Days Discharge Instructions: Apply over primary dressing as directed. Secured With: [employer], 4.5x3.1 (in/yd) 1 x Per Day/15 Days Discharge Instructions: Secure with Kerlix as directed. Secured With: Paper T ape, 2x10 (in/yd) 1 x Per Day/15 Days Discharge Instructions: Secure dressing with tape as directed. 1. Again an extensive debridement today. 2. I see no reason to change the primary dressing which is Santyl covered by silver alginate 3. IV vancomycin suggested  by Dr. Algis Liming for the MRSA that was originally cultured and the plain x-rays that showed osteomyelitis 4. Dr. Algis Liming also made the patient aware about the possibility of a below-knee amputation and I agree with this however for the moment he is not in any pain and does not have spreading infection 5. I will speak with Dr. Chales Abrahams about how she feels about a referral to vascular surgeryo Possible angiogram Electronic Signature(s) Signed: 01/22/2021 4:56:56 PM By: Baltazar Najjar MD Entered By: Baltazar Najjar on 01/22/2021 12:19:00 -------------------------------------------------------------------------------- SuperBill Details Patient Name: Date of Service: [name], [name] 01/22/2021 Medical Record Number: [id number] Patient Account Number: [id number] Date of Birth/Sex: Treating RN: 1931-06-17 (87 y.o. [name] Primary Care Provider: [name] Other Clinician: Referring Provider: Treating Provider/Extender: Allen Norris in Treatment: 2 Diagnosis Coding ICD-10 Codes Code Description [phone] Pressure ulcer of left heel, unstageable M86.172 Other acute osteomyelitis, left ankle and foot B95.62 Methicillin resistant Staphylococcus aureus infection as the cause of diseases classified elsewhere Facility Procedures CPT4 Code: [id number] Description: 11042 - DEB SUBQ TISSUE 20 SQ CM/< ICD-10 Diagnosis Description L89.620 Pressure ulcer of left  heel, unstageable Modifier: Quantity: 1 CPT4 Code: [id number] Description: 11045 - DEB SUBQ TISS EA ADDL 20CM ICD-10 Diagnosis Description L89.620 Pressure ulcer of left heel, unstageable Modifier: Quantity: 1 Physician Procedures : CPT4 Code Description Modifier [id number] 11042 - WC PHYS SUBQ TISS 20 SQ CM ICD-10 Diagnosis Description L89.620 Pressure ulcer of left heel, unstageable Quantity: 1 : [id number] 11045 - WC PHYS SUBQ TISS EA ADDL 20 CM ICD-10 Diagnosis Description L89.620 Pressure ulcer of left heel, unstageable Quantity: 1 Electronic Signature(s) Signed: 01/22/2021 4:56:56 PM By: Baltazar Najjar MD Entered By: Baltazar Najjar on 01/22/2021 12:20:06

## 2021-01-22 NOTE — Progress Notes (Signed)
Greg [name] ([id number]) , Visit Report for 01/22/2021 Arrival Information Details Patient Name: Date of Service: Greg [name] [facility] [place] 01/22/2021 10:30 A M Medical Record Number: [id number] Patient Account Number: [id number] Date of Birth/Sex: Treating RN: 11/05/1931 (87 y.o. Greg [name], Greg [name] Primary Care Greg [name]: Greg [name] Other Clinician: Referring Greg [name]: Treating Greg [name]/Extender: Greg [name] in Treatment: 2 Visit Information History Since Last Visit Added or deleted any medications: No Patient Arrived: Wheel Chair Any new allergies or adverse reactions: No Arrival Time: 11:28 Had a fall or experienced change in No Accompanied By: wife activities of daily living that may affect Transfer Assistance: None risk of falls: Patient Identification Verified: Yes Signs or symptoms of abuse/neglect since last visito No Secondary Verification Process Completed: Yes Hospitalized since last visit: No Patient Requires Transmission-Based Precautions: No Implantable device outside of the clinic excluding No Patient Has Alerts: Yes cellular tissue based products placed in the center Patient Alerts: Patient on Blood Thinner since last visit: L ABI non compressible Has Dressing in Place as Prescribed: Yes Has Footwear/Offloading in Place as Prescribed: Yes Left: Wedge Shoe Pain Present Now: No Electronic Signature(s) Signed: 01/22/2021 5:13:39 PM By: Greg Stall RN, BSN Entered By: Greg [name] on 01/22/2021 11:31:35 -------------------------------------------------------------------------------- Encounter Discharge Information Details Patient Name: Date of Service: Greg [name], Greg [name] 01/22/2021 10:30 A M Medical Record Number: [id number] Patient Account Number: [id number] Date of Birth/Sex: Treating RN: 04/28/1931 (87 y.o. Greg [name] Primary Care Shanetra Blumenstock: Greg [name] Other Clinician: Referring Shanterria Franta: Treating Author Hatlestad/Extender: Greg [name] in Treatment: 2 Encounter Discharge Information Items Post Procedure Vitals Discharge Condition: Stable Temperature (F): 98.5 Ambulatory Status: Wheelchair Pulse (bpm): 85 Discharge Destination: Home Respiratory Rate (breaths/min): 16 Transportation: Private Auto Blood Pressure (mmHg): 133/84 Accompanied By: wife Schedule Follow-up Appointment: Yes Clinical Summary of Care: Electronic Signature(s) Signed: 01/22/2021 5:13:39 PM By: Greg Stall RN, BSN Entered By: Greg [name] on 01/22/2021 12:03:01 -------------------------------------------------------------------------------- Lower Extremity Assessment Details Patient Name: Date of Service: Greg [name] [name] 01/22/2021 10:30 A M Medical Record Number: [id number] Patient Account Number: [id number] Date of Birth/Sex: Treating RN: 22-May-1931 (87 y.o. Greg [name] Primary Care Greg [name]: Greg [name] Other Clinician: Referring Greg [name]: Treating Greg [name]/Extender: Greg [name] in Treatment: 2 Edema Assessment Assessed: Greg [name]: Yes] Greg [name]: No] Edema: [Left: Ye] [Right: s] Calf Left: Right: Point of Measurement: 36 cm From Medial Instep 43 cm Ankle Left: Right: Point of Measurement: 11 cm From Medial Instep 28.5 cm Vascular Assessment Pulses: Dorsalis Pedis Palpable: [Left:Yes] Electronic Signature(s) Signed: 01/22/2021 5:13:39 PM By: Greg Stall RN, BSN Entered By: Greg [name] on 01/22/2021 11:35:59 -------------------------------------------------------------------------------- Multi Wound Chart Details Patient Name: Date of Service: Greg [name], Greg [name] 01/22/2021 10:30 A M Medical Record Number: [id number] Patient Account Number: [id number] Date of Birth/Sex: Treating RN: 1931/05/08 (87 y.o. [name], [name] Primary Care Greg [name]: Greg [name] Other Clinician: Referring Greg [name]: Treating Greg [name]/Extender: Greg [name] in  Treatment: 2 Vital Signs Height(in): 73 Pulse(bpm): 85 Weight(lbs): 180 Blood Pressure(mmHg): 133/84 Body Mass Index(BMI): 24 Temperature(F): 98.5 Respiratory Rate(breaths/min): 16 Photos: [1:Left Calcaneus] [N/A:N/A N/A] Wound Location: [1:Blister] [N/A:N/A] Wounding Event: [1:Pressure Ulcer] [N/A:N/A] Primary Etiology: [1:Hypertension, Osteoarthritis] [N/A:N/A] Comorbid History: [1:10/24/2020] [N/A:N/A] Date Acquired: [1:2] [N/A:N/A] Weeks of Treatment: [1:Open] [N/A:N/A] Wound Status: [1:5.5x6.1x0.3] [N/A:N/A] Measurements L x W x D (cm) [1:26.35] [N/A:N/A] A (cm) : rea [1:7.905] [N/A:N/A] Volume (cm) : [1:44.10%] [N/A:N/A] % Reduction in A rea: [1:-67.80%] [N/A:N/A] % Reduction in Volume: [1:Unstageable/Unclassified] [N/A:N/A] Classification: [1:Large] [N/A:N/A] Exudate  A mount: [1:Serosanguineous] [N/A:N/A] Exudate Type: [1:red, brown] [N/A:N/A] Exudate Color: [1:Yes] [N/A:N/A] Foul Odor A Cleansing: [1:fter No] [N/A:N/A] Odor A nticipated Due to Product Use: [1:Distinct, outline attached] [N/A:N/A] Wound Margin: [1:None Present (0%)] [N/A:N/A] Granulation A mount: [1:Large (67-100%)] [N/A:N/A] Necrotic A mount: [1:Eschar, Adherent Slough] [N/A:N/A] Necrotic Tissue: [1:Fascia: No] [N/A:N/A] Exposed Structures: [1:Fat Layer (Subcutaneous Tissue): No Tendon: No Muscle: No Joint: No Bone: No Small (1-33%)] [N/A:N/A] Epithelialization: [1:Debridement - Selective/Open Wound] [N/A:N/A] Debridement: Pre-procedure Verification/Time Out 11:50 [N/A:N/A] Taken: [1:Other] [N/A:N/A] Pain Control: [1:Necrotic/Eschar] [N/A:N/A] Tissue Debrided: [1:Non-Viable Tissue] [N/A:N/A] Level: [1:25] [N/A:N/A] Debridement A (sq cm): [1:rea Blade, Forceps] [N/A:N/A] Instrument: [1:Minimum] [N/A:N/A] Bleeding: [1:Pressure] [N/A:N/A] Hemostasis A chieved: [1:0] [N/A:N/A] Procedural Pain: [1:0] [N/A:N/A] Post Procedural Pain: [1:Procedure was tolerated well] [N/A:N/A] Debridement  Treatment Response: [1:5.5x6.1x0.3] [N/A:N/A] Post Debridement Measurements L x W x D (cm) [1:7.905] [N/A:N/A] Post Debridement Volume: (cm) [1:Unstageable/Unclassified] [N/A:N/A] Post Debridement Stage: [1:Debridement] [N/A:N/A] Treatment Notes Wound #1 (Calcaneus) Wound Laterality: Left Cleanser Wound Cleanser Discharge Instruction: Cleanse the wound with wound cleanser prior to applying a clean dressing using gauze sponges, not tissue or cotton balls. Peri-Wound Care Topical Primary Dressing KerraCel Ag Gelling Fiber Dressing, 4x5 in (silver alginate) Discharge Instruction: Apply silver alginate to wound bed, over Santyl Santyl Ointment Discharge Instruction: Apply nickel thick amount to wound bed, under silver alginate Secondary Dressing Woven Gauze Sponge, Non-Sterile 4x4 in Discharge Instruction: Apply over primary dressing as directed. ALLEVYN Heel 4 1/2in x 5 1/2in / 10.5cm x 13.5cm Discharge Instruction: Apply over primary dressing as directed. Secured With [employer], 4.5x3.1 (in/yd) Discharge Instruction: Secure with Kerlix as directed. Paper Tape, 2x10 (in/yd) Discharge Instruction: Secure dressing with tape as directed. Compression Wrap Compression Stockings Add-Ons Electronic Signature(s) Signed: 01/22/2021 4:50:06 PM By: Fonnie Mu RN Signed: 01/22/2021 4:56:56 PM By: Baltazar Najjar MD Entered By: Baltazar Najjar on 01/22/2021 12:11:19 -------------------------------------------------------------------------------- Multi-Disciplinary Care Plan Details Patient Name: Date of Service: Greg [name], [place] [name] 01/22/2021 10:30 A M Medical Record Number: [id number] Patient Account Number: [id number] Date of Birth/Sex: Treating RN: Oct 17, 1931 (87 y.o. Greg [name], Millard.Loa Primary Care Carlitos Bottino: Greg [name] Other Clinician: Referring Osten Janek: Treating Adriene Padula/Extender: Greg [name] in Treatment: 2 Multidisciplinary Care Plan  reviewed with physician Active Inactive Abuse / Safety / Falls / Self Care Management Nursing Diagnoses: History of Falls Potential for injury related to falls Goals: Patient will not experience any injury related to falls Date Initiated: 01/04/2021 Target Resolution Date: 02/01/2021 Goal Status: Active Patient/caregiver will verbalize/demonstrate measures taken to prevent injury and/or falls Date Initiated: 01/04/2021 Target Resolution Date: 02/01/2021 Goal Status: Active Interventions: Assess Activities of Daily Living upon admission and as needed Assess fall risk on admission and as needed Assess: immobility, friction, shearing, incontinence upon admission and as needed Assess impairment of mobility on admission and as needed per policy Assess personal safety and home safety (as indicated) on admission and as needed Assess self care needs on admission and as needed Provide education on fall prevention Provide education on personal and home safety Notes: Pressure Nursing Diagnoses: Knowledge deficit related to causes and risk factors for pressure ulcer development Knowledge deficit related to management of pressures ulcers Potential for impaired tissue integrity related to pressure, friction, moisture, and shear Goals: Patient/caregiver will verbalize risk factors for pressure ulcer development Date Initiated: 01/04/2021 Target Resolution Date: 02/01/2021 Goal Status: Active Patient/caregiver will verbalize understanding of pressure ulcer management Date Initiated: 01/04/2021 Target Resolution Date: 02/01/2021 Goal Status: Active Interventions: Assess: immobility, friction, shearing, incontinence  upon admission and as needed Assess offloading mechanisms upon admission and as needed Assess potential for pressure ulcer upon admission and as needed Notes: Tissue Oxygenation Nursing Diagnoses: Actual ineffective tissue perfusion; peripheral (select once diagnosis is  confirmed) Goals: Non-invasive arterial studies are completed as ordered Date Initiated: 01/22/2021 Target Resolution Date: 01/22/2021 Goal Status: Active Interventions: Assess patient understanding of disease process and management upon diagnosis and as needed Assess peripheral arterial status upon admission and as needed Provide education on tissue oxygenation and ischemia Treatment Activities: Ankle Brachial Index (ABI) : 01/22/2021 Non-invasive vascular studies : 01/22/2021 Notes: Wound/Skin Impairment Nursing Diagnoses: Impaired tissue integrity Knowledge deficit related to ulceration/compromised skin integrity Goals: Patient/caregiver will verbalize understanding of skin care regimen Date Initiated: 01/04/2021 Target Resolution Date: 02/01/2021 Goal Status: Active Interventions: Assess patient/caregiver ability to obtain necessary supplies Assess patient/caregiver ability to perform ulcer/skin care regimen upon admission and as needed Assess ulceration(s) every visit Provide education on ulcer and skin care Notes: Electronic Signature(s) Signed: 01/22/2021 5:13:39 PM By: Greg Stall RN, BSN Entered By: Greg [name] on 01/22/2021 12:01:00 -------------------------------------------------------------------------------- Pain Assessment Details Patient Name: Date of Service: Greg [name], Greg [name] 01/22/2021 10:30 A M Medical Record Number: [id number] Patient Account Number: [id number] Date of Birth/Sex: Treating RN: 02/16/1932 (87 y.o. Greg [name] Primary Care Amayrany Cafaro: Greg [name] Other Clinician: Referring Quest Tavenner: Treating Kyleigha Markert/Extender: Greg [name] in Treatment: 2 Active Problems Location of Pain Severity and Description of Pain Patient Has Paino No Site Locations Rate the pain. Rate the pain. Current Pain Level: 0 Pain Management and Medication Current Pain Management: Medication: No Cold Application: No Rest:  No Massage: No Activity: No T.E.N.S.: No Heat Application: No Leg drop or elevation: No Is the Current Pain Management Adequate: Adequate How does your wound impact your activities of daily livingo Sleep: No Bathing: No Appetite: No Relationship With Others: No Bladder Continence: No Emotions: No Bowel Continence: No Work: No Toileting: No Drive: No Dressing: No Hobbies: No Psychologist, prison and probation services) Signed: 01/22/2021 5:13:39 PM By: Greg Stall RN, BSN Entered By: Greg [name] on 01/22/2021 11:32:30 -------------------------------------------------------------------------------- Patient/Caregiver Education Details Patient Name: Date of Service: Greg [name], Greg [name] 10/18/2022andnbsp10:30 A M Medical Record Number: [id number] Patient Account Number: [id number] Date of Birth/Gender: Treating RN: 08-08-1931 (87 y.o. Greg [name] Primary Care Physician: Greg [name] Other Clinician: Referring Physician: Treating Physician/Extender: Greg [name] in Treatment: 2 Education Assessment Education Provided To: Patient Education Topics Provided Wound/Skin Impairment: Handouts: Skin Care Do's and Dont's Methods: Explain/Verbal Responses: Reinforcements needed Electronic Signature(s) Signed: 01/22/2021 5:13:39 PM By: Greg Stall RN, BSN Entered By: Greg [name] on 01/22/2021 12:01:20 -------------------------------------------------------------------------------- Wound Assessment Details Patient Name: Date of Service: Greg [name], Greg [name] 01/22/2021 10:30 A M Medical Record Number: [id number] Patient Account Number: [id number] Date of Birth/Sex: Treating RN: 10-30-1931 (87 y.o. Greg [name], Greg [name] Primary Care Erum Cercone: Greg [name] Other Clinician: Referring Edgel Degnan: Treating Rechelle Niebla/Extender: Greg [name] in Treatment: 2 Wound Status Wound Number: 1 Primary Etiology: Pressure Ulcer Wound Location: Left Calcaneus Wound  Status: Open Wounding Event: Blister Comorbid History: Hypertension, Osteoarthritis Date Acquired: 10/24/2020 Weeks Of Treatment: 2 Clustered Wound: No Photos Wound Measurements Length: (cm) 5.5 Width: (cm) 6.1 Depth: (cm) 0.3 Area: (cm) 26.35 Volume: (cm) 7.905 % Reduction in Area: 44.1% % Reduction in Volume: -67.8% Epithelialization: Small (1-33%) Tunneling: No Undermining: No Wound Description Classification: Unstageable/Unclassified Wound Margin: Distinct, outline attached Exudate Amount: Large Exudate Type: Serosanguineous Exudate Color: red, brown Foul Odor After Cleansing: Yes  Due to Product Use: No Slough/Fibrino Yes Wound Bed Granulation Amount: None Present (0%) Exposed Structure Necrotic Amount: Large (67-100%) Fascia Exposed: No Necrotic Quality: Eschar, Adherent Slough Fat Layer (Subcutaneous Tissue) Exposed: No Tendon Exposed: No Muscle Exposed: No Joint Exposed: No Bone Exposed: No Treatment Notes Wound #1 (Calcaneus) Wound Laterality: Left Cleanser Wound Cleanser Discharge Instruction: Cleanse the wound with wound cleanser prior to applying a clean dressing using gauze sponges, not tissue or cotton balls. Peri-Wound Care Topical Primary Dressing KerraCel Ag Gelling Fiber Dressing, 4x5 in (silver alginate) Discharge Instruction: Apply silver alginate to wound bed, over Santyl Santyl Ointment Discharge Instruction: Apply nickel thick amount to wound bed, under silver alginate Secondary Dressing Woven Gauze Sponge, Non-Sterile 4x4 in Discharge Instruction: Apply over primary dressing as directed. ALLEVYN Heel 4 1/2in x 5 1/2in / 10.5cm x 13.5cm Discharge Instruction: Apply over primary dressing as directed. Secured With [employer], 4.5x3.1 (in/yd) Discharge Instruction: Secure with Kerlix as directed. Paper Tape, 2x10 (in/yd) Discharge Instruction: Secure dressing with tape as directed. Compression Wrap Compression  Stockings Add-Ons Electronic Signature(s) Signed: 01/22/2021 5:13:39 PM By: Greg Stall RN, BSN Entered By: Greg [name] on 01/22/2021 11:39:18 -------------------------------------------------------------------------------- Vitals Details Patient Name: Date of Service: Greg [name], Greg [name] 01/22/2021 10:30 A M Medical Record Number: [id number] Patient Account Number: [id number] Date of Birth/Sex: Treating RN: 03/02/1932 (87 y.o. Greg [name], Greg [name] Primary Care Chrislynn Mosely: Greg [name] Other Clinician: Referring Ermal Brzozowski: Treating Chenel Wernli/Extender: Greg [name] in Treatment: 2 Vital Signs Time Taken: 11:30 Temperature (F): 98.5 Height (in): 73 Pulse (bpm): 85 Weight (lbs): 180 Respiratory Rate (breaths/min): 16 Body Mass Index (BMI): 23.7 Blood Pressure (mmHg): 133/84 Reference Range: 80 - 120 mg / dl Electronic Signature(s) Signed: 01/22/2021 5:13:39 PM By: Greg Stall RN, BSN Entered By: Greg [name] on 01/22/2021 11:32:10

## 2021-01-29 ENCOUNTER — Non-Acute Institutional Stay (SKILLED_NURSING_FACILITY): Payer: Medicare Other | Admitting: Nurse Practitioner

## 2021-01-29 ENCOUNTER — Encounter: Payer: Self-pay | Admitting: Nurse Practitioner

## 2021-01-29 DIAGNOSIS — R7303 Prediabetes: Secondary | ICD-10-CM

## 2021-01-29 DIAGNOSIS — D500 Iron deficiency anemia secondary to blood loss (chronic): Secondary | ICD-10-CM | POA: Diagnosis not present

## 2021-01-29 DIAGNOSIS — E871 Hypo-osmolality and hyponatremia: Secondary | ICD-10-CM

## 2021-01-29 DIAGNOSIS — I82411 Acute embolism and thrombosis of right femoral vein: Secondary | ICD-10-CM

## 2021-01-29 DIAGNOSIS — M159 Polyosteoarthritis, unspecified: Secondary | ICD-10-CM

## 2021-01-29 DIAGNOSIS — N401 Enlarged prostate with lower urinary tract symptoms: Secondary | ICD-10-CM

## 2021-01-29 DIAGNOSIS — G20 Parkinson's disease: Secondary | ICD-10-CM

## 2021-01-29 DIAGNOSIS — M86172 Other acute osteomyelitis, left ankle and foot: Secondary | ICD-10-CM | POA: Diagnosis not present

## 2021-01-29 DIAGNOSIS — M810 Age-related osteoporosis without current pathological fracture: Secondary | ICD-10-CM

## 2021-01-29 DIAGNOSIS — R609 Edema, unspecified: Secondary | ICD-10-CM | POA: Diagnosis not present

## 2021-01-29 DIAGNOSIS — I10 Essential (primary) hypertension: Secondary | ICD-10-CM

## 2021-01-29 DIAGNOSIS — N1832 Chronic kidney disease, stage 3b: Secondary | ICD-10-CM

## 2021-01-29 DIAGNOSIS — R350 Frequency of micturition: Secondary | ICD-10-CM

## 2021-01-29 DIAGNOSIS — I509 Heart failure, unspecified: Secondary | ICD-10-CM

## 2021-01-29 DIAGNOSIS — E785 Hyperlipidemia, unspecified: Secondary | ICD-10-CM

## 2021-01-29 DIAGNOSIS — K5901 Slow transit constipation: Secondary | ICD-10-CM

## 2021-01-29 NOTE — Progress Notes (Addendum)
Location:   SNF [place] Room Number: 63 Place of Service:  SNF (31) Provider: [facility] Micheale Schlack NP  Virgie Dad, MD  Patient Care Team: Virgie Dad, MD as PCP - General (Internal Medicine) [name], Eustace Quail, DO as Consulting Physician (Neurology)  Extended Emergency Contact Information Primary Emergency Contact: Vergia Alcon Address: [address].          apt [phone] Johnnette Litter of [place] Phone: [phone] Mobile Phone: [phone] Relation: Spouse Secondary Emergency Contact: teofil, maniaci Mobile Phone: [phone] Relation: Son  Code Status:  DNR Goals of care: Advanced Directive information Advanced Directives 01/17/2021  Does Patient Have a Medical Advance Directive? Yes  Type of Advance Directive Living will  Does patient want to make changes to medical advance directive? No - Patient declined  Would patient like information on creating a medical advance directive? -     Chief Complaint  Patient presents with  . Acute Visit    Decreased hemoglobin level.      HPI:  Pt is a 87 y.o. male seen today for an acute visit for decreased Hgb 9.2 01/28/21, no apparent s/s of bleeding, denied abd pain, nausea, vomiting, or indigestion.   Left calcaneous osteomyelitis, PICC IV Vanco, f/u ID, wound care center for debride,  delaying BKA decision. Weekly BMP, CRP, ESR  DVT 12/21/20 R femoral vein, on Eliquis.  Edema BLE, L>R, on Furosemide.               Hyponatremia, Na 138 01/28/21             Prediabetes, diet control, Hgb a1c 5.9             Parkinson's disease, diagnosed 2019, f/u Neurology, taking Sinemet.             Urinary frequency, chronic, BPH             OA multiple sites, ambulates with walker prior to the right hip fx.10/10/20 s/p R ORIF right hip             OP, took Fosamax x 6 years, stopped 2018, on Ca, Vit D             Hyperlipidemia, not taking statin             HTN, off Carvedilol             Constipation, takes  MiraLax, Senna.               Past Medical History:  Diagnosis Date  . Acute bronchiolitis   . Acute osteomyelitis of calcaneum, left ([place]) 01/11/2021  . Anemia   . Bursitis of both hips   . Hearing reduced    hearing aid  . History of basal cell carcinoma   . Hyperlipidemia   . Malaise and fatigue   . MRSA infection 01/11/2021  . Osteoarthritis    hand  . Osteoporosis   . Palpitation   . Vitamin D deficiency    Past Surgical History:  Procedure Laterality Date  . CATARACT EXTRACTION  05/31/2015   Eye/right  . COLONOSCOPY  02/05/2005, 03/06/2005   normal, 10 years ago  . MOHS micrographic surgery     Face surgery  . ORIF ACETABULAR FRACTURE Right 10/09/2020   Procedure: OPEN REDUCTION INTERNAL FIXATION (ORIF) ACETABULAR FRACTURE;  Surgeon: Altamese Alamillo, MD;  Location: [place];  Service: Orthopedics;  Laterality: Right;    No Known Allergies  Allergies as  of 01/29/2021   No Known Allergies      Medication List        Accurate as of January 29, 2021 11:59 PM. If you have any questions, ask your nurse or doctor.          acetaminophen 325 MG tablet Commonly known as: TYLENOL Take 1-2 tablets (325-650 mg total) by mouth every 6 (six) hours as needed for mild pain (pain score 1-3 or temp > 100.5).   apixaban 5 MG Tabs tablet Commonly known as: ELIQUIS Take 1 tablet (5 mg total) by mouth 2 (two) times daily.   CALCIUM-VITAMIN D3 PO Take 600 mg by mouth in the morning.   carbidopa-levodopa 25-100 MG tablet Commonly known as: SINEMET IR Take 1 tablet by mouth 4 (four) times daily.   carbidopa-levodopa 50-200 MG tablet Commonly known as: SINEMET CR Take 1 tablet by mouth at bedtime.   feeding supplement (PRO-STAT SUGAR FREE 64) Liqd Take 30 mLs by mouth in the morning and at bedtime.   heparin flush 10 UNIT/ML Soln injection Inject 10 Units into the vein 2 (two) times daily. Flush with 5 mL of Heparin 10u/mL after Antibiotic administration and saline flush  of 10 mL.   polyethylene glycol 17 g packet Commonly known as: MiraLax Take 17 g by mouth daily.   senna 8.6 MG Tabs tablet Commonly known as: SENOKOT Take 1 tablet (8.6 mg total) by mouth daily.   sodium chloride irrigation 0.9 % irrigation Irrigate with as directed 2 (two) times daily. Flush before Antibiotic PICC Administration with 10cc of NS   vancomycin 750 mg in sodium chloride 0.9 % 250 mL Inject 750 mg into the vein every 12 (twelve) hours.   zinc oxide 20 % ointment Apply 1 application topically as needed for irritation.        Review of Systems  Constitutional:  Negative for activity change, appetite change and fever.  HENT:  Negative for congestion and trouble swallowing.   Eyes:  Negative for visual disturbance.  Respiratory:  Negative for shortness of breath.   Cardiovascular:  Positive for leg swelling. Negative for chest pain.  Gastrointestinal:  Negative for abdominal pain and constipation.  Genitourinary:  Positive for frequency. Negative for difficulty urinating and urgency.       1-2x/night nocturnal urination.   Musculoskeletal:  Positive for arthralgias and gait problem.  Skin:  Positive for wound.       Left heel.   Neurological:  Positive for tremors. Negative for speech difficulty, weakness and light-headedness.  Psychiatric/Behavioral:  Positive for sleep disturbance. Negative for confusion. The patient is not nervous/anxious.        Not sleeping well, but declined sleeping aid.    Immunization History  Administered Date(s) Administered  . H1N1 06/13/2008  . Influenza Split 03/05/2006, 02/26/2007, 01/26/2008, 01/18/2009, 12/12/2009, 01/16/2011, 01/29/2012, 02/01/2013, 02/08/2014, 02/13/2015, 01/10/2016, 02/23/2017, 02/24/2018, 01/24/2019, 12/22/2019  . PFIZER(Purple Top)SARS-COV-2 Vaccination 05/04/2019, 05/25/2019, 09/01/2020  . Pension scheme manager 72yr & up 12/27/2019, 12/26/2020  . Pneumococcal Conjugate-13 02/10/2014  .  Pneumococcal Polysaccharide-23 06/10/2011  . Zoster, Live 07/04/2009   Pertinent  Health Maintenance Due  Topic Date Due  . INFLUENZA VACCINE  11/05/2020   Fall Risk 10/11/2020 10/11/2020 10/11/2020 10/12/2020 01/10/2021  Falls in the past year? - - - - 1  Was there an injury with Fall? - - - - 1  Fall Risk Category Calculator - - - - 2  Fall Risk Category - - - - Moderate  Patient Fall Risk Level _0    Functional Status Survey:    Vitals:   01/29/21 1247  BP: 137/88  Pulse: 83  Resp: 18  Temp: 98.8 F (37.1 C)  SpO2: 95%   There is no height or weight on file to calculate BMI. Physical Exam Vitals reviewed.  Constitutional:      Appearance: Normal appearance.  HENT:     Head: Normocephalic and atraumatic.     Nose: Nose normal.     Mouth/Throat:     Mouth: Mucous membranes are moist.  Eyes:     Extraocular Movements: Extraocular movements intact.     Conjunctiva/sclera: Conjunctivae normal.     Pupils: Pupils are equal, round, and reactive to light.  Cardiovascular:     Rate and Rhythm: Normal rate and regular rhythm.     Heart sounds: Murmur heard.  Pulmonary:     Effort: Pulmonary effort is normal.     Breath sounds: No rales.  Abdominal:     General: Bowel sounds are normal.     Palpations: Abdomen is soft.     Tenderness: There is no abdominal tenderness.  Musculoskeletal:     Cervical back: Normal range of motion and neck supple.     Right lower leg: Edema present.     Left lower leg: Edema present.     Comments: edema BLE L>R 2-3+. Edematous penis noted too  Skin:    General: Skin is warm and dry.     Comments: Left heel DTI, covered in dressing during today's examination.   Neurological:     General: No focal deficit present.     Mental Status: He is alert and oriented to person, place, and time. Mental status is at baseline.     Motor: No weakness.     Gait: Gait abnormal.      Comments: Mild overall stiffness   Psychiatric:        Mood and Affect: Mood normal.        Behavior: Behavior normal.        Thought Content: Thought content normal.        Judgment: Judgment normal.    Labs reviewed: Recent Labs    10/09/20 0546 10/09/20 0937 10/10/20 0607 10/11/20 0037 10/12/20 0146 10/25/20 0000 01/08/21 0000 01/12/21 0000 01/15/21 0000  NA  --    < > 131* 129* 132*   < > 136* 135* 137  K  --    < > 4.2 4.5 4.1   < > 4.8 5.0 4.4  CL  --    < > 97* 96* 97*   < > 104 103 104  CO2  --    < > _1 < > 24* 27* 23*  GLUCOSE  --    < > 134* 114* 122*  --   --   --   --   BUN  --    < > 23 26* 24*   < > 35* 29* 24*  CREATININE  --    < > 1.20 1.14 1.14   < > 1.4* 1.3 1.3  CALCIUM  --    < > 8.7* 8.6* 8.4*   < > 8.6* 8.5* 8.5*  MG  --   --  2.1  --   --   --   --   --   --   PHOS 3.4  --   --   --  3.5  --   --   --   --    < > = values in this interval not displayed.   Recent Labs    10/09/20 0937 10/11/20 0037 10/12/20 0146 12/26/20 0000 12/27/20 0000  AST 22 34  --  19 18  ALT 7 <5  --  9* 6*  ALKPHOS 31* 24*  --  55 46  BILITOT 1.7* 1.1  --   --   --   PROT 5.9* 4.6*  --   --   --   ALBUMIN 3.4* 2.6* 2.6* 3.7 2.9*   Recent Labs    10/10/20 0607 10/11/20 0037 10/12/20 0146 10/12/20 0146 10/25/20 0000 11/19/20 0000 12/27/20 0000 01/15/21 0000  WBC 13.8* 10.1 8.5   < > 7.1 6.2 7.8 7.0  NEUTROABS  --   --  6.4  --  5,013.00  --  5,694.00 4,515.00  HGB 8.7* 7.4* 7.8*  --  8.9* 9.9* 10.1* 10.3*  HCT 25.3* 21.8* 22.8*  --  27* 31* 31* 33*  MCV 96.2 96.9 96.6  --   --   --   --   --   PLT 93* 90* 116*  --  293 206 283 267   < > = values in this interval not displayed.   No results found for: TSH Lab Results  Component Value Date   HGBA1C 5.9 (H) 10/08/2020   No results found for: CHOL, HDL, LDLCALC, LDLDIRECT, TRIG, CHOLHDL  Significant Diagnostic Results in last 30 days:  VAS [place] ABI WITH/WO TBI  Result Date: 01/21/2021  LOWER  EXTREMITY DOPPLER STUDY Patient Name:  RYMAN RATHGEBER  Date of Exam:   01/21/2021 Medical Rec #: [id number]      Accession #:    [id number] Date of Birth: 29-Nov-1931      Patient Gender: M Patient Age:   38 years Exam Location:  [facility] Procedure:      VAS [place] ABI WITH/WO TBI Referring Phys: MICHAEL ROBSON --------------------------------------------------------------------------------  High Risk Factors: Hypertension, hyperlipidemia. Other Factors: Non healing left heel wound.  Performing Technologist: Delorise Shiner RVT  Examination Guidelines: A complete evaluation includes at minimum, Doppler waveform signals and systolic blood pressure reading at the level of bilateral brachial, anterior tibial, and posterior tibial arteries, when vessel segments are accessible. Bilateral testing is considered an integral part of a complete examination. Photoelectric Plethysmograph (PPG) waveforms and toe systolic pressure readings are included as required and additional duplex testing as needed. Limited examinations for reoccurring indications may be performed as noted.  ABI Findings: +---------+------------------+-----+---------+--------------------+ Right    Rt Pressure (mmHg)IndexWaveform Comment              +---------+------------------+-----+---------+--------------------+ Brachial                                 Indwelling PICC line +---------+------------------+-----+---------+--------------------+ PTA      151               1.08 biphasic                      +---------+------------------+-----+---------+--------------------+ DP       158               1.13 triphasic                     +---------+------------------+-----+---------+--------------------+ Quillian Quince  0.54                               +---------+------------------+-----+---------+--------------------+ +---------+------------------+-----+----------+-------+ Left     Lt Pressure  (mmHg)IndexWaveform  Comment +---------+------------------+-----+----------+-------+ Brachial 140                                      +---------+------------------+-----+----------+-------+ ATA      255                    monophasic        +---------+------------------+-----+----------+-------+ PTA      255               1.82 biphasic          +---------+------------------+-----+----------+-------+ Great Toe52                0.37                   +---------+------------------+-----+----------+-------+ +-------+----------------+-----------+------------+------------+ ABI/TBIToday's ABI     Today's TBIPrevious ABIPrevious TBI +-------+----------------+-----------+------------+------------+ Right  1.13            0.54                                +-------+----------------+-----------+------------+------------+ Left   Non compressible0.37                                +-------+----------------+-----------+------------+------------+  Summary: Right: Resting right ankle-brachial index is within normal range. No evidence of significant right lower extremity arterial disease. The right toe-brachial index is abnormal. Left: Resting left ankle-brachial index indicates noncompressible left lower extremity arteries. The left toe-brachial index is abnormal.  *See table(s) above for measurements and observations.  Electronically signed by Orlie Pollen on 01/21/2021 at 7:06:40 PM.    Final    VAS [place] LOWER EXTREMITY ARTERIAL DUPLEX  Result Date: 01/21/2021 LOWER EXTREMITY ARTERIAL DUPLEX STUDY Patient Name:  AYRON FILLINGER  Date of Exam:   01/21/2021 Medical Rec #: [id number]      Accession #:    [id number] Date of Birth: [birth year]      Patient Gender: M Patient Age:   24 years Exam Location:  [facility] Procedure:      VAS [place] LOWER EXTREMITY ARTERIAL DUPLEX Referring Phys: Legrand Como ROBSON  --------------------------------------------------------------------------------  Indications: Ulceration, and peripheral artery disease. High Risk Factors: Hypertension. Other Factors: Non healing wound left heel. MRSA.  Current ABI: Right: 1.13, Left: non compressible Performing Technologist: Delorise Shiner RVT  Examination Guidelines: A complete evaluation includes B-mode imaging, spectral Doppler, color Doppler, and power Doppler as needed of all accessible portions of each vessel. Bilateral testing is considered an integral part of a complete examination. Limited examinations for reoccurring indications may be performed as noted.   +----------+--------+-----+--------+----------+--------+ LEFT      PSV cm/sRatioStenosisWaveform  Comments +----------+--------+-----+--------+----------+--------+ CFA Distal143                  triphasic          +----------+--------+-----+--------+----------+--------+ DFA       76                   triphasic          +----------+--------+-----+--------+----------+--------+ SFA  Prox  88                   triphasic          +----------+--------+-----+--------+----------+--------+ SFA Mid   90                   monophasic         +----------+--------+-----+--------+----------+--------+ SFA Distal119                  triphasic          +----------+--------+-----+--------+----------+--------+ POP Prox  53                   triphasic          +----------+--------+-----+--------+----------+--------+ POP Distal90                   triphasic          +----------+--------+-----+--------+----------+--------+ ATA Distal61                   monophasic         +----------+--------+-----+--------+----------+--------+ PTA Distal92                   monophasic         +----------+--------+-----+--------+----------+--------+ No focal hemodynamically significant stenosis identified. Waveform analysis is consistent with occlusive  disease in the proximal calf tibial outflow arteries.  Summary: Left: Findings are consistent with tibial outflow disease. No other hemodynamically significant stenosis is identified.  See table(s) above for measurements and observations. Electronically signed by Orlie Pollen on 01/21/2021 at 7:06:29 PM.    Final    IR PICC PLACEMENT RIGHT >5 YRS INC IMG GUIDE  Result Date: 01/14/2021 INDICATION: Calcaneal osteomyelitis, in need of long-term IV antibiotic treatment. Request for PICC line placement. EXAM: ULTRASOUND AND FLUOROSCOPIC GUIDED PICC LINE INSERTION MEDICATIONS: 1% lidocaine CONTRAST:  None FLUOROSCOPY TIME:  24 seconds (1 mGy) COMPLICATIONS: None immediate. TECHNIQUE: The procedure, risks, benefits, and alternatives were explained to the patient and informed written consent was obtained. The right upper extremity was prepped with chlorhexidine in a sterile fashion, and a sterile drape was applied covering the operative field. Maximum barrier sterile technique with sterile gowns and gloves were used for the procedure. A timeout was performed prior to the initiation of the procedure. Local anesthesia was provided with 1% lidocaine. After the overlying soft tissues were anesthetized with 1% lidocaine, a micropuncture kit was utilized to access the right basilic vein. Real-time ultrasound guidance was utilized for vascular access including the acquisition of a permanent ultrasound image documenting patency of the accessed vessel. A guidewire was advanced to the level of the superior caval-atrial junction for measurement purposes and the PICC line was cut to length. A peel-away sheath was placed and a 43 cm, 5 [place], single lumen was inserted to level of the superior caval-atrial junction. A post procedure spot fluoroscopic was obtained. The catheter easily aspirated and flushed and was secured in place. A dressing was placed. The patient tolerated the procedure well without immediate post procedural  complication. FINDINGS: After catheter placement, the tip lies within the superior cavoatrial junction. The catheter aspirates and flushes normally and is ready for immediate use. IMPRESSION: Successful ultrasound and fluoroscopic guided placement of a right basilic vein approach, 43 cm, 5 [place], single lumen PICC with tip at the superior caval-atrial junction. The PICC line is ready for immediate use. Read by: Durenda Guthrie, PA-C Electronically Signed   By: Sandi Mariscal  M.D.   On: 01/14/2021 14:57    Assessment/Plan Blood loss anemia Hgb dropped to 9.2 01/28/21, no apparent bleeding, taking Eliquis. Will obtain CBC/diff, Iron, FeSat, TIBC, Ferritin, Vit B12, Folate, FOBT x3. Observe.    Acute osteomyelitis of left calcaneus (HCC) Left calcaneous osteomyelitis, PICC IV Vanco, f/u ID, wound care center for debride,  delaying BKA decision. Weekly BMP, CRP, ESR  DVT of deep femoral vein, right (HCC) DVT 12/21/20 R femoral vein, on Eliquis.   Edema Edema BLE, L>R, dependent edema, penis is edematous, continue Furosemide.   Hyponatremia Na 138 01/28/21  Prediabetes diet control, Hgb a1c 5.9  Parkinson's disease ([place])  diagnosed 2019, f/u Neurology, taking Sinemet.  BPH (benign prostatic hyperplasia) Urinary frequency, chronic, BPH  Osteoarthritis, multiple sites OA multiple sites, ambulates with walker prior to the right hip fx.10/10/20 s/p R ORIF right hip  Osteoporosis took Fosamax x 6 years, stopped 2018, on Ca, Vit D  Hyperlipidemia , not taking statin  Slow transit constipation takes MiraLax, Senna.  HTN (hypertension) Blood pressure is controlled, off meds.   CKD (chronic kidney disease) stage 3, GFR 30-59 ml/min (HCC) 01/31/21 Na 134, K 4.2, Bun 27, creat 1.07, eGFR 67  CHF (congestive heart failure) ([place]) 01/31/21 CXR diffused bilateral interstitial infiltrate, small bilateral effusions L>R, CHF vs PNA. BNP 282    Family/ staff Communication: plan of care reviewed with  the patient and charge nurse.   Labs/tests ordered: Iron, FeSat, TIBC, Ferritin, Vit B12, Folate, CBC/diff  Times spend 40 minutes.

## 2021-01-29 NOTE — Assessment & Plan Note (Signed)
Left calcaneous osteomyelitis, PICC IV Vanco, f/u ID, wound care center for debride,  delaying BKA decision. Weekly BMP, CRP, ESR

## 2021-01-29 NOTE — Assessment & Plan Note (Addendum)
Edema BLE, L>R, dependent edema, penis is edematous, continue Furosemide.

## 2021-01-29 NOTE — Assessment & Plan Note (Signed)
Urinary frequency, chronic, BPH

## 2021-01-29 NOTE — Assessment & Plan Note (Signed)
DVT 12/21/20 R femoral vein, on Eliquis.

## 2021-01-29 NOTE — Assessment & Plan Note (Addendum)
Hgb dropped to 9.2 01/28/21, no apparent bleeding, taking Eliquis. Will obtain CBC/diff, Iron, FeSat, TIBC, Ferritin, Vit B12, Folate, FOBT x3. Observe.

## 2021-01-29 NOTE — Assessment & Plan Note (Signed)
takes MiraLax, Senna. 

## 2021-01-29 NOTE — Assessment & Plan Note (Signed)
Blood pressure is controlled, off meds.  

## 2021-01-29 NOTE — Assessment & Plan Note (Signed)
diagnosed 2019, f/u Neurology, taking Sinemet.

## 2021-01-29 NOTE — Assessment & Plan Note (Signed)
took Fosamax x 6 years, stopped 2018, on Ca, Vit D

## 2021-01-29 NOTE — Assessment & Plan Note (Signed)
diet control, Hgb a1c 5.9

## 2021-01-29 NOTE — Assessment & Plan Note (Signed)
Na 138 01/28/21

## 2021-01-29 NOTE — Assessment & Plan Note (Signed)
,   not taking statin.  

## 2021-01-29 NOTE — Assessment & Plan Note (Signed)
OA multiple sites, ambulates with walker prior to the right hip fx.10/10/20 s/p R ORIF right hip

## 2021-01-30 ENCOUNTER — Encounter: Payer: Self-pay | Admitting: Orthopedic Surgery

## 2021-01-30 ENCOUNTER — Non-Acute Institutional Stay (SKILLED_NURSING_FACILITY): Payer: Medicare Other | Admitting: Orthopedic Surgery

## 2021-01-30 DIAGNOSIS — M86172 Other acute osteomyelitis, left ankle and foot: Secondary | ICD-10-CM

## 2021-01-30 DIAGNOSIS — D500 Iron deficiency anemia secondary to blood loss (chronic): Secondary | ICD-10-CM

## 2021-01-30 DIAGNOSIS — R062 Wheezing: Secondary | ICD-10-CM

## 2021-01-30 DIAGNOSIS — K5901 Slow transit constipation: Secondary | ICD-10-CM

## 2021-01-30 DIAGNOSIS — I10 Essential (primary) hypertension: Secondary | ICD-10-CM

## 2021-01-30 DIAGNOSIS — R609 Edema, unspecified: Secondary | ICD-10-CM

## 2021-01-30 DIAGNOSIS — R051 Acute cough: Secondary | ICD-10-CM | POA: Diagnosis not present

## 2021-01-30 DIAGNOSIS — M810 Age-related osteoporosis without current pathological fracture: Secondary | ICD-10-CM

## 2021-01-30 DIAGNOSIS — M159 Polyosteoarthritis, unspecified: Secondary | ICD-10-CM

## 2021-01-30 DIAGNOSIS — G20 Parkinson's disease: Secondary | ICD-10-CM

## 2021-01-30 DIAGNOSIS — I82411 Acute embolism and thrombosis of right femoral vein: Secondary | ICD-10-CM | POA: Diagnosis not present

## 2021-01-30 NOTE — Progress Notes (Signed)
Location:   [facility] Room Number: 27 Place of Service:  SNF [phone]) Provider:  Windell Moulding, NP  Virgie Dad, MD  Patient Care Team: Virgie Dad, MD as PCP - General (Internal Medicine) [name], Eustace Quail, DO as Consulting Physician (Neurology)  Extended Emergency Contact Information Primary Emergency Contact: Vergia Alcon Address: [place].          apt [phone] Johnnette Litter of [place] Phone: [phone] Mobile Phone: [phone] Relation: Spouse Secondary Emergency Contact: Greg [name], Greg [name] Mobile Phone: [phone] Relation: Son  Code Status:  FULL CODE Goals of care: Advanced Directive information Advanced Directives 01/17/2021  Does Patient Have a Medical Advance Directive? Yes  Type of Advance Directive Living will  Does patient want to make changes to medical advance directive? No - Patient declined  Would patient like information on creating a medical advance directive? -     Chief Complaint  Patient presents with   Acute Visit    Cough    HPI:  Pt is a 87 y.o. male seen today for an acute visit for dry cough.   In the past 2 days he has developed a dry cough. Cough stimulated with talking and laughing. He denies chest pain, sob or sputum production. Remains on room air, afebrile, vitals stable. He is up to date with covid boosters and flu vaccine.   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 Peripheral edema- LLE > RLE, penis edematous as well, remains on lasix 20 mg daily Parkinson's- followed by neurology, remains on sinemet HTN- BUN/creat 2/1.13 01/21/2021, off carvedilol at this time Anemia- hgb 9.2 01/28/2021, no apparent blood loss at  this time, anemia panel ordered- pending  Osteoporosis- ORIF right hip 10/10/2020, ambulating with walker, stopped fosamax 2018 (6 years use), remains on calcium and vitamin D supplements Constipation- LBM 10/26, remains on miralax and senna  Past Medical History:  Diagnosis Date   Acute bronchiolitis    Acute osteomyelitis of calcaneum, left ([place]) 01/11/2021   Anemia    Bursitis of both hips    Hearing reduced    hearing aid   History of basal cell carcinoma    Hyperlipidemia    Malaise and fatigue    MRSA infection 01/11/2021   Osteoarthritis    hand   Osteoporosis    Palpitation    Vitamin D deficiency    Past Surgical History:  Procedure Laterality Date   CATARACT EXTRACTION  05/31/2015   Eye/right   COLONOSCOPY  02/05/2005, 03/06/2005   normal, 10 years ago   MOHS micrographic surgery     Face surgery   ORIF ACETABULAR FRACTURE Right 10/09/2020   Procedure: OPEN REDUCTION INTERNAL FIXATION (ORIF) ACETABULAR FRACTURE;  Surgeon: Altamese Catawba, MD;  Location: [facility];  Service: Orthopedics;  Laterality: Right;    No Known Allergies  Allergies as of 01/30/2021   No Known Allergies      Medication List        Accurate as of January 30, 2021 11:20 AM. If you have any questions, ask your nurse or doctor.          acetaminophen 325 MG tablet Commonly known as: TYLENOL Take 1-2 tablets (325-650 mg total) by mouth every 6 (  six) hours as needed for mild pain (pain score 1-3 or temp > 100.5).   apixaban 5 MG Tabs tablet Commonly known as: ELIQUIS Take 1 tablet (5 mg total) by mouth 2 (two) times daily.   CALCIUM-VITAMIN D3 PO Take 600 mg by mouth in the morning.   carbidopa-levodopa 25-100 MG tablet Commonly known as: SINEMET IR Take 1 tablet by mouth 4 (four) times daily.   carbidopa-levodopa 50-200 MG tablet Commonly known as: SINEMET CR Take 1 tablet by mouth at bedtime.   feeding supplement (PRO-STAT SUGAR FREE 64) Liqd Take 30 mLs by mouth in the morning  and at bedtime.   furosemide 20 MG tablet Commonly known as: LASIX Take 20 mg by mouth.   heparin flush 10 UNIT/ML Soln injection Inject 10 Units into the vein 2 (two) times daily. Flush with 5 mL of Heparin 10u/mL after Antibiotic administration and saline flush of 10 mL.   polyethylene glycol 17 g packet Commonly known as: MiraLax Take 17 g by mouth daily.   senna 8.6 MG Tabs tablet Commonly known as: SENOKOT Take 1 tablet (8.6 mg total) by mouth daily.   sodium chloride irrigation 0.9 % irrigation Irrigate with as directed 2 (two) times daily. Flush before Antibiotic PICC Administration with 10cc of NS   vancomycin 750 mg in sodium chloride 0.9 % 250 mL Inject 750 mg into the vein every 12 (twelve) hours.   Vitamin D (Cholecalciferol) 25 MCG (1000 UT) Tabs Take 25 mcg by mouth daily.   zinc oxide 20 % ointment Apply 1 application topically as needed for irritation.        Review of Systems  Constitutional:  Negative for activity change, appetite change, chills, fatigue and fever.  HENT:  Negative for congestion, hearing loss and trouble swallowing.   Eyes:  Negative for visual disturbance.  Respiratory:  Positive for cough and wheezing. Negative for shortness of breath.   Cardiovascular:  Positive for leg swelling. Negative for chest pain.  Gastrointestinal:  Negative for abdominal distention, abdominal pain, blood in stool, constipation, diarrhea, nausea and vomiting.  Genitourinary:  Positive for frequency. Negative for dysuria and hematuria.  Musculoskeletal:  Positive for arthralgias, gait problem and myalgias.  Skin:  Positive for wound.  Neurological:  Positive for tremors and weakness. Negative for dizziness and headaches.  Psychiatric/Behavioral:  Negative for confusion, dysphoric mood and sleep disturbance. The patient is not nervous/anxious.    Immunization History  Administered Date(s) Administered   H1N1 06/13/2008   Influenza Split 03/05/2006,  02/26/2007, 01/26/2008, 01/18/2009, 12/12/2009, 01/16/2011, 01/29/2012, 02/01/2013, 02/08/2014, 02/13/2015, 01/10/2016, 02/23/2017, 02/24/2018, 01/24/2019, 12/22/2019   PFIZER(Purple Top)SARS-COV-2 Vaccination 05/04/2019, 05/25/2019, 09/01/2020   Pfizer Covid-19 Vaccine Bivalent Booster 16yr & up 12/27/2019, 12/26/2020   Pneumococcal Conjugate-13 02/10/2014   Pneumococcal Polysaccharide-23 06/10/2011   Zoster, Live 07/04/2009   Pertinent  Health Maintenance Due  Topic Date Due   INFLUENZA VACCINE  11/05/2020   Fall Risk 10/11/2020 10/11/2020 10/11/2020 10/12/2020 01/10/2021  Falls in the past year? - - - - 1  Was there an injury with Fall? - - - - 1  Fall Risk Category Calculator - - - - 2  Fall Risk Category - - - - Moderate  Patient Fall Risk Level High fall risk High fall risk High fall risk High fall risk High fall risk   Functional Status Survey:    Vitals:   01/30/21 1102  BP: 130/65  Pulse: 88  Resp: 20  Temp: 97.9 F (36.6 C)  SpO2: 91%  Weight: 193 lb 12.8 oz (87.9 kg)  Height: '5\' 9"'  (1.753 m)   Body mass index is 28.62 kg/m. Physical Exam Vitals reviewed.  Constitutional:      General: He is not in acute distress. HENT:     Head: Normocephalic.     Right Ear: There is no impacted cerumen.     Left Ear: There is no impacted cerumen.     Nose: Nose normal.     Mouth/Throat:     Mouth: Mucous membranes are moist.  Eyes:     General:        Right eye: No discharge.        Left eye: No discharge.  Neck:     Vascular: No carotid bruit.  Cardiovascular:     Rate and Rhythm: Normal rate and regular rhythm.     Pulses: Normal pulses.     Heart sounds: Normal heart sounds. No murmur heard. Pulmonary:     Effort: Pulmonary effort is normal. No respiratory distress.     Breath sounds: Examination of the right-upper field reveals wheezing. Examination of the left-upper field reveals wheezing. Examination of the right-middle field reveals rhonchi. Examination of the  left-middle field reveals rhonchi. Wheezing, rhonchi and rales present.  Abdominal:     General: Bowel sounds are normal. There is no distension.     Palpations: Abdomen is soft.     Tenderness: There is no abdominal tenderness.  Genitourinary:    Penis: Swelling present.   Musculoskeletal:     Cervical back: Normal range of motion.     Right lower leg: Edema present.     Left lower leg: Edema present.     Comments: LLE and RLE 2+ pitting edema  Lymphadenopathy:     Cervical: No cervical adenopathy.  Skin:    General: Skin is warm and dry.     Capillary Refill: Capillary refill takes less than 2 seconds.     Comments: UTA left heel due to dressing  Neurological:     General: No focal deficit present.     Mental Status: He is alert and oriented to person, place, and time.     Motor: Weakness present.     Gait: Gait abnormal.     Comments: Walker  Psychiatric:        Mood and Affect: Mood normal.        Behavior: Behavior normal.    Labs reviewed: Recent Labs    10/09/20 0546 10/09/20 0937 10/10/20 0607 10/11/20 0037 10/12/20 0146 10/25/20 0000 01/08/21 0000 01/12/21 0000 01/15/21 0000  NA  --    < > 131* 129* 132*   < > 136* 135* 137  K  --    < > 4.2 4.5 4.1   < > 4.8 5.0 4.4  CL  --    < > 97* 96* 97*   < > 104 103 104  CO2  --    < > '28 29 28   ' < > 24* 27* 23*  GLUCOSE  --    < > 134* 114* 122*  --   --   --   --   BUN  --    < > 23 26* 24*   < > 35* 29* 24*  CREATININE  --    < > 1.20 1.14 1.14   < > 1.4* 1.3 1.3  CALCIUM  --    < > 8.7* 8.6* 8.4*   < > 8.6* 8.5* 8.5*  MG  --   --  2.1  --   --   --   --   --   --   PHOS 3.4  --   --   --  3.5  --   --   --   --    < > = values in this interval not displayed.   Recent Labs    10/09/20 0937 10/11/20 0037 10/12/20 0146 12/26/20 0000 12/27/20 0000  AST 22 34  --  19 18  ALT 7 <5  --  9* 6*  ALKPHOS 31* 24*  --  55 46  BILITOT 1.7* 1.1  --   --   --   PROT 5.9* 4.6*  --   --   --   ALBUMIN 3.4* 2.6*  2.6* 3.7 2.9*   Recent Labs    10/10/20 0607 10/11/20 0037 10/12/20 0146 10/12/20 0146 10/25/20 0000 11/19/20 0000 12/27/20 0000 01/15/21 0000  WBC 13.8* 10.1 8.5   < > 7.1 6.2 7.8 7.0  NEUTROABS  --   --  6.4  --  5,013.00  --  5,694.00 4,515.00  HGB 8.7* 7.4* 7.8*  --  8.9* 9.9* 10.1* 10.3*  HCT 25.3* 21.8* 22.8*  --  27* 31* 31* 33*  MCV 96.2 96.9 96.6  --   --   --   --   --   PLT 93* 90* 116*  --  293 206 283 267   < > = values in this interval not displayed.   No results found for: TSH Lab Results  Component Value Date   HGBA1C 5.9 (H) 10/08/2020   No results found for: CHOL, HDL, LDLCALC, LDLDIRECT, TRIG, CHOLHDL  Significant Diagnostic Results in last 30 days:  VAS [place] ABI WITH/WO TBI  Result Date: 01/21/2021  LOWER EXTREMITY DOPPLER STUDY Patient Name:  Greg [name]  Date of Exam:   01/21/2021 Medical Rec #: [id number]      Accession #:    [id number] Date of Birth: [birth year]      Patient Gender: M Patient Age:   38 years Exam Location:  [facility] Procedure:      VAS [place] ABI WITH/WO TBI Referring Phys: MICHAEL ROBSON --------------------------------------------------------------------------------  High Risk Factors: Hypertension, hyperlipidemia. Other Factors: Non healing left heel wound.  Performing Technologist: Delorise Shiner RVT  Examination Guidelines: A complete evaluation includes at minimum, Doppler waveform signals and systolic blood pressure reading at the level of bilateral brachial, anterior tibial, and posterior tibial arteries, when vessel segments are accessible. Bilateral testing is considered an integral part of a complete examination. Photoelectric Plethysmograph (PPG) waveforms and toe systolic pressure readings are included as required and additional duplex testing as needed. Limited examinations for reoccurring indications may be performed as noted.  ABI Findings: +---------+------------------+-----+---------+--------------------+ Right     Rt Pressure (mmHg)IndexWaveform Comment              +---------+------------------+-----+---------+--------------------+ Brachial                                 Indwelling PICC line +---------+------------------+-----+---------+--------------------+ PTA      151               1.08 biphasic                      +---------+------------------+-----+---------+--------------------+ DP       158  1.13 triphasic                     +---------+------------------+-----+---------+--------------------+ Great Toe75                0.54                               +---------+------------------+-----+---------+--------------------+ +---------+------------------+-----+----------+-------+ Left     Lt Pressure (mmHg)IndexWaveform  Comment +---------+------------------+-----+----------+-------+ Brachial 140                                      +---------+------------------+-----+----------+-------+ ATA      255                    monophasic        +---------+------------------+-----+----------+-------+ PTA      255               1.82 biphasic          +---------+------------------+-----+----------+-------+ Great Toe52                0.37                   +---------+------------------+-----+----------+-------+ +-------+----------------+-----------+------------+------------+ ABI/TBIToday's ABI     Today's TBIPrevious ABIPrevious TBI +-------+----------------+-----------+------------+------------+ Right  1.13            0.54                                +-------+----------------+-----------+------------+------------+ Left   Non compressible0.37                                +-------+----------------+-----------+------------+------------+  Summary: Right: Resting right ankle-brachial index is within normal range. No evidence of significant right lower extremity arterial disease. The right toe-brachial index is abnormal. Left: Resting left  ankle-brachial index indicates noncompressible left lower extremity arteries. The left toe-brachial index is abnormal.  *See table(s) above for measurements and observations.  Electronically signed by Orlie Pollen on 01/21/2021 at 7:06:40 PM.    Final    VAS [place] LOWER EXTREMITY ARTERIAL DUPLEX  Result Date: 01/21/2021 LOWER EXTREMITY ARTERIAL DUPLEX STUDY Patient Name:  Greg [name]  Date of Exam:   01/21/2021 Medical Rec #: [id number]      Accession #:    [id number] Date of Birth: 06/28/1931      Patient Gender: M Patient Age:   87 years Exam Location:  [facility] Procedure:      VAS [place] LOWER EXTREMITY ARTERIAL DUPLEX Referring Phys: Legrand Como ROBSON --------------------------------------------------------------------------------  Indications: Ulceration, and peripheral artery disease. High Risk Factors: Hypertension. Other Factors: Non healing wound left heel. MRSA.  Current ABI: Right: 1.13, Left: non compressible Performing Technologist: Delorise Shiner RVT  Examination Guidelines: A complete evaluation includes B-mode imaging, spectral Doppler, color Doppler, and power Doppler as needed of all accessible portions of each vessel. Bilateral testing is considered an integral part of a complete examination. Limited examinations for reoccurring indications may be performed as noted.   +----------+--------+-----+--------+----------+--------+ LEFT      PSV cm/sRatioStenosisWaveform  Comments +----------+--------+-----+--------+----------+--------+ CFA Distal143                  triphasic          +----------+--------+-----+--------+----------+--------+  DFA       76                   triphasic          +----------+--------+-----+--------+----------+--------+ SFA Prox  88                   triphasic          +----------+--------+-----+--------+----------+--------+ SFA Mid   90                   monophasic          +----------+--------+-----+--------+----------+--------+ SFA Distal119                  triphasic          +----------+--------+-----+--------+----------+--------+ POP Prox  53                   triphasic          +----------+--------+-----+--------+----------+--------+ POP Distal90                   triphasic          +----------+--------+-----+--------+----------+--------+ ATA Distal61                   monophasic         +----------+--------+-----+--------+----------+--------+ PTA Distal92                   monophasic         +----------+--------+-----+--------+----------+--------+ No focal hemodynamically significant stenosis identified. Waveform analysis is consistent with occlusive disease in the proximal calf tibial outflow arteries.  Summary: Left: Findings are consistent with tibial outflow disease. No other hemodynamically significant stenosis is identified.  See table(s) above for measurements and observations. Electronically signed by Orlie Pollen on 01/21/2021 at 7:06:29 PM.    Final    IR PICC PLACEMENT RIGHT >5 YRS INC IMG GUIDE  Result Date: 01/14/2021 INDICATION: Calcaneal osteomyelitis, in need of long-term IV antibiotic treatment. Request for PICC line placement. EXAM: ULTRASOUND AND FLUOROSCOPIC GUIDED PICC LINE INSERTION MEDICATIONS: 1% lidocaine CONTRAST:  None FLUOROSCOPY TIME:  24 seconds (1 mGy) COMPLICATIONS: None immediate. TECHNIQUE: The procedure, risks, benefits, and alternatives were explained to the patient and informed written consent was obtained. The right upper extremity was prepped with chlorhexidine in a sterile fashion, and a sterile drape was applied covering the operative field. Maximum barrier sterile technique with sterile gowns and gloves were used for the procedure. A timeout was performed prior to the initiation of the procedure. Local anesthesia was provided with 1% lidocaine. After the overlying soft tissues were anesthetized  with 1% lidocaine, a micropuncture kit was utilized to access the right basilic vein. Real-time ultrasound guidance was utilized for vascular access including the acquisition of a permanent ultrasound image documenting patency of the accessed vessel. A guidewire was advanced to the level of the superior caval-atrial junction for measurement purposes and the PICC line was cut to length. A peel-away sheath was placed and a 43 cm, 5 [place], single lumen was inserted to level of the superior caval-atrial junction. A post procedure spot fluoroscopic was obtained. The catheter easily aspirated and flushed and was secured in place. A dressing was placed. The patient tolerated the procedure well without immediate post procedural complication. FINDINGS: After catheter placement, the tip lies within the superior cavoatrial junction. The catheter aspirates and flushes normally and is ready for immediate use. IMPRESSION: Successful ultrasound and fluoroscopic guided placement of a right  basilic vein approach, 43 cm, 5 [place], single lumen PICC with tip at the superior caval-atrial junction. The PICC line is ready for immediate use. Read by: Durenda Guthrie, PA-C Electronically Signed   By: Sandi Mariscal M.D.   On: 01/14/2021 14:57    Assessment/Plan 1. Acute cough - expiratory wheezing and rales along upper and middles lobes - increased edema to BLE and penis - suspect fluid overload - Increase Lasix 40 mg po daily x 3 days - CXR - BNP  2. Expiratory wheezing - noted in upper lung fields - start albuterol sulfate 90 mcg- 2 puffs Q4hrs prn  3.  Acute osteomyelitis of left calcaneus (HCC) - followed by wound care center and ID - recent ABI indicated tibial outflow disease - patient still deciding on L BKA - cont daily dresing changes with Santyl and silver alginate - cont long term antibiotics via PICC line/ IV vanc  4. DVT of deep femoral vein, right (HCC) - cont Eliquis- started 12/21/2020  5. Peripheral  edema - LLE > RLE - penis edematous - see above  6. Parkinson's disease ([place]) - followed by neurology - cont Sinemet - cont skilled nursing care  7. Primary hypertension - controlled without medication  8. Blood loss anemia - hgb 9.2, no sign of blood loss - anemia panel- pending - cont FOBT x 3  9. Primary osteoarthritis involving multiple joints - ORIF right hip 10/2020 - cont tylenol for pain  10. Age-related osteoporosis without current pathological fracture - fosamax stopped in 2018 - cont calcium and vitamin D supplement  11. Slow transit constipation - LBM 10/26 - cont miralax and senna    Family/ staff Communication: plan discussed with patient and nurse  Labs/tests ordered:  CXR, BNP

## 2021-02-01 ENCOUNTER — Non-Acute Institutional Stay (SKILLED_NURSING_FACILITY): Payer: Medicare Other | Admitting: Orthopedic Surgery

## 2021-02-01 ENCOUNTER — Encounter: Payer: Self-pay | Admitting: Orthopedic Surgery

## 2021-02-01 DIAGNOSIS — I82411 Acute embolism and thrombosis of right femoral vein: Secondary | ICD-10-CM

## 2021-02-01 DIAGNOSIS — M86172 Other acute osteomyelitis, left ankle and foot: Secondary | ICD-10-CM | POA: Diagnosis not present

## 2021-02-01 DIAGNOSIS — I509 Heart failure, unspecified: Secondary | ICD-10-CM

## 2021-02-01 DIAGNOSIS — G20A1 Parkinson's disease without dyskinesia, without mention of fluctuations: Secondary | ICD-10-CM

## 2021-02-01 DIAGNOSIS — K5901 Slow transit constipation: Secondary | ICD-10-CM

## 2021-02-01 DIAGNOSIS — M810 Age-related osteoporosis without current pathological fracture: Secondary | ICD-10-CM

## 2021-02-01 DIAGNOSIS — M150 Primary generalized (osteo)arthritis: Secondary | ICD-10-CM

## 2021-02-01 DIAGNOSIS — M159 Polyosteoarthritis, unspecified: Secondary | ICD-10-CM

## 2021-02-01 DIAGNOSIS — R609 Edema, unspecified: Secondary | ICD-10-CM | POA: Diagnosis not present

## 2021-02-01 DIAGNOSIS — G20 Parkinson's disease: Secondary | ICD-10-CM

## 2021-02-01 DIAGNOSIS — I10 Essential (primary) hypertension: Secondary | ICD-10-CM

## 2021-02-01 DIAGNOSIS — R600 Localized edema: Secondary | ICD-10-CM

## 2021-02-01 DIAGNOSIS — D500 Iron deficiency anemia secondary to blood loss (chronic): Secondary | ICD-10-CM

## 2021-02-01 NOTE — Progress Notes (Signed)
Location:  [facility] Room Number: 27 Place of Service:  SNF [phone]) Provider:  Windell Moulding, AGNP-C  Virgie Dad, MD  Patient Care Team: Virgie Dad, MD as PCP - General (Internal Medicine) [name], Eustace Quail, DO as Consulting Physician (Neurology)  Extended Emergency Contact Information Primary Emergency Contact: Vergia Alcon Address: [address].          apt [phone] Johnnette Litter of [place] Phone: [phone] Mobile Phone: [phone] Relation: Spouse Secondary Emergency Contact: jasyah, theurer Mobile Phone: [phone] Relation: Son  Code Status:  DNR Goals of care: Advanced Directive information Advanced Directives 01/17/2021  Does Patient Have a Medical Advance Directive? Yes  Type of Advance Directive Living will  Does patient want to make changes to medical advance directive? No - Patient declined  Would patient like information on creating a medical advance directive? -     Chief Complaint  Patient presents with   Acute Visit    Shortness of breath     HPI:  Pt is a 87 y.o. male seen today for acute visit due to shortness of breath.   Wife present during encounter.   Seen 10/26 for acute cough. CXR revealed bilateral interstitial pulmonary infiltrate and small bilateral pulmonary effusions, left greater than right, suggestive of pneumonia versus CHF. BNP 282 01/31/2021. He was given furosemide 80 mg 01/31/2021 due to 2+ pitting edema, penile edema, shortness of breath and weight gain of 20 lbs. Today, he reports improved breathing, sats > 90% on room air. Remains on NAS diet.   Left heel wound- diagnosed with left calcaneous osteomyelitis, wound culture indicated MRSA, followed by wound care center and ID, PICC line with Vanc, dressing changes Santyl and silver alginate daily, ABI completed no stenosis- consistent with tibial outflow disease, denies left foot pain today DVT of deep femoral vein- diagnosed 12/2020,  remains on Eliquis Peripheral edema- LLE > RLE, penis edematous as well , 2+ pitting edema today Parkinson's- followed by neurology, remains on sinemet HTN- BUN/creat 27/1.07 01/31/2021, off carvedilol at this time Anemia- hgb 9.2 01/28/2021, no apparent blood loss at this time,  Osteoporosis- ORIF right hip 10/10/2020, ambulating with walker, stopped fosamax 2018 (6 years use), remains on calcium and vitamin D supplements Constipation- LBM 10/28, remains on miralax and senna     Past Medical History:  Diagnosis Date   Acute bronchiolitis    Acute osteomyelitis of calcaneum, left ([place]) 01/11/2021   Anemia    Bursitis of both hips    Hearing reduced    hearing aid   History of basal cell carcinoma    Hyperlipidemia    Malaise and fatigue    MRSA infection 01/11/2021   Osteoarthritis    hand   Osteoporosis    Palpitation    Vitamin D deficiency    Past Surgical History:  Procedure Laterality Date   CATARACT EXTRACTION  05/31/2015   Eye/right   COLONOSCOPY  02/05/2005, 03/06/2005   normal, 10 years ago   MOHS micrographic surgery     Face surgery   ORIF ACETABULAR FRACTURE Right 10/09/2020   Procedure: OPEN REDUCTION INTERNAL FIXATION (ORIF) ACETABULAR FRACTURE;  Surgeon: Altamese La Veta, MD;  Location: [facility];  Service: Orthopedics;  Laterality: Right;    No Known Allergies  Outpatient Encounter Medications as of 02/01/2021  Medication Sig   acetaminophen (TYLENOL) 325 MG tablet Take 1-2 tablets (325-650 mg total) by mouth every 6 (six) hours as needed for mild pain (  pain score 1-3 or temp > 100.5).   Amino Acids-Protein Hydrolys (FEEDING SUPPLEMENT, PRO-STAT SUGAR FREE 64,) LIQD Take 30 mLs by mouth in the morning and at bedtime.   apixaban (ELIQUIS) 5 MG TABS tablet Take 1 tablet (5 mg total) by mouth 2 (two) times daily.   Calcium Carbonate-Vitamin D (CALCIUM-VITAMIN D3 PO) Take 600 mg by mouth in the morning.   carbidopa-levodopa (SINEMET CR) 50-200 MG tablet Take 1 tablet  by mouth at bedtime.   carbidopa-levodopa (SINEMET IR) 25-100 MG tablet Take 1 tablet by mouth 4 (four) times daily.   furosemide (LASIX) 20 MG tablet Take 20 mg by mouth.   heparin flush 10 UNIT/ML SOLN injection Inject 10 Units into the vein 2 (two) times daily. Flush with 5 mL of Heparin 10u/mL after Antibiotic administration and saline flush of 10 mL.   polyethylene glycol (MIRALAX) 17 g packet Take 17 g by mouth daily.   senna (SENOKOT) 8.6 MG TABS tablet Take 1 tablet (8.6 mg total) by mouth daily.   sodium chloride irrigation 0.9 % irrigation Irrigate with as directed 2 (two) times daily. Flush before Antibiotic PICC Administration with 10cc of NS   vancomycin 750 mg in sodium chloride 0.9 % 250 mL Inject 750 mg into the vein every 12 (twelve) hours.   Vitamin D, Cholecalciferol, 25 MCG (1000 UT) TABS Take 25 mcg by mouth daily.   zinc oxide 20 % ointment Apply 1 application topically as needed for irritation.   No facility-administered encounter medications on file as of 02/01/2021.    Review of Systems  Constitutional:  Negative for activity change, appetite change, chills, fatigue and fever.  HENT:  Negative for congestion and trouble swallowing.   Eyes:  Negative for visual disturbance.  Respiratory:  Positive for cough and shortness of breath. Negative for wheezing.   Cardiovascular:  Positive for leg swelling. Negative for chest pain.  Gastrointestinal:  Positive for constipation. Negative for abdominal distention, abdominal pain, blood in stool, diarrhea, nausea and vomiting.  Genitourinary:  Negative for dysuria, frequency and hematuria.  Musculoskeletal:  Positive for arthralgias and gait problem.  Skin:        Left heel osteomyelitis  Neurological:  Positive for weakness. Negative for dizziness and headaches.  Psychiatric/Behavioral:  Negative for dysphoric mood and sleep disturbance. The patient is not nervous/anxious.    Immunization History  Administered Date(s)  Administered   H1N1 06/13/2008   Influenza Split 03/05/2006, 02/26/2007, 01/26/2008, 01/18/2009, 12/12/2009, 01/16/2011, 01/29/2012, 02/01/2013, 02/08/2014, 02/13/2015, 01/10/2016, 02/23/2017, 02/24/2018, 01/24/2019, 12/22/2019   PFIZER(Purple Top)SARS-COV-2 Vaccination 05/04/2019, 05/25/2019, 09/01/2020   Pfizer Covid-19 Vaccine Bivalent Booster 59yr & up 12/27/2019, 12/26/2020   Pneumococcal Conjugate-13 02/10/2014   Pneumococcal Polysaccharide-23 06/10/2011   Zoster, Live 07/04/2009   Pertinent  Health Maintenance Due  Topic Date Due   INFLUENZA VACCINE  11/05/2020   Fall Risk 10/11/2020 10/11/2020 10/11/2020 10/12/2020 01/10/2021  Falls in the past year? - - - - 1  Was there an injury with Fall? - - - - 1  Fall Risk Category Calculator - - - - 2  Fall Risk Category - - - - Moderate  Patient Fall Risk Level High fall risk High fall risk High fall risk High fall risk High fall risk   Functional Status Survey:    Vitals:   02/01/21 1445  BP: 133/72  Pulse: 90  Resp: (!) 22  Temp: (!) 97.3 F (36.3 C)  SpO2: 94%  Weight: 201 lb 12.8 oz (91.5 kg)  Height:  '5\' 9"'  (1.753 m)   Body mass index is 29.8 kg/m. Physical Exam Vitals reviewed.  Constitutional:      General: He is not in acute distress. HENT:     Head: Normocephalic.  Eyes:     General:        Right eye: No discharge.        Left eye: No discharge.  Neck:     Vascular: No carotid bruit.  Cardiovascular:     Rate and Rhythm: Normal rate and regular rhythm.     Pulses: Normal pulses.     Heart sounds: Normal heart sounds. No murmur heard. Pulmonary:     Effort: Pulmonary effort is normal. No respiratory distress.     Breath sounds: Wheezing and rales present.  Abdominal:     General: Bowel sounds are normal. There is no distension.     Palpations: Abdomen is soft.     Tenderness: There is no abdominal tenderness.  Musculoskeletal:     Cervical back: Normal range of motion.     Right lower leg: Edema present.      Left lower leg: Edema present.     Comments: 2+ pitting edema  Lymphadenopathy:     Cervical: No cervical adenopathy.  Skin:    General: Skin is warm and dry.     Capillary Refill: Capillary refill takes less than 2 seconds.     Comments: Left heel wound UTA due to dressing, no odor noted.   Neurological:     General: No focal deficit present.     Mental Status: He is alert and oriented to person, place, and time.     Motor: Weakness present.     Gait: Gait abnormal.  Psychiatric:        Mood and Affect: Mood normal.        Behavior: Behavior normal.    Labs reviewed: Recent Labs    10/09/20 0546 10/09/20 0937 10/10/20 0607 10/11/20 0037 10/12/20 0146 10/25/20 0000 01/08/21 0000 01/12/21 0000 01/15/21 0000  NA  --    < > 131* 129* 132*   < > 136* 135* 137  K  --    < > 4.2 4.5 4.1   < > 4.8 5.0 4.4  CL  --    < > 97* 96* 97*   < > 104 103 104  CO2  --    < > '28 29 28   ' < > 24* 27* 23*  GLUCOSE  --    < > 134* 114* 122*  --   --   --   --   BUN  --    < > 23 26* 24*   < > 35* 29* 24*  CREATININE  --    < > 1.20 1.14 1.14   < > 1.4* 1.3 1.3  CALCIUM  --    < > 8.7* 8.6* 8.4*   < > 8.6* 8.5* 8.5*  MG  --   --  2.1  --   --   --   --   --   --   PHOS 3.4  --   --   --  3.5  --   --   --   --    < > = values in this interval not displayed.   Recent Labs    10/09/20 0937 10/11/20 0037 10/12/20 0146 12/26/20 0000 12/27/20 0000  AST 22 34  --  19 18  ALT 7 <5  --  9* 6*  ALKPHOS 31* 24*  --  55 46  BILITOT 1.7* 1.1  --   --   --   PROT 5.9* 4.6*  --   --   --   ALBUMIN 3.4* 2.6* 2.6* 3.7 2.9*   Recent Labs    10/10/20 0607 10/11/20 0037 10/12/20 0146 10/12/20 0146 10/25/20 0000 11/19/20 0000 12/27/20 0000 01/15/21 0000  WBC 13.8* 10.1 8.5   < > 7.1 6.2 7.8 7.0  NEUTROABS  --   --  6.4  --  5,013.00  --  5,694.00 4,515.00  HGB 8.7* 7.4* 7.8*  --  8.9* 9.9* 10.1* 10.3*  HCT 25.3* 21.8* 22.8*  --  27* 31* 31* 33*  MCV 96.2 96.9 96.6  --   --   --   --   --    PLT 93* 90* 116*  --  293 206 283 267   < > = values in this interval not displayed.   No results found for: TSH Lab Results  Component Value Date   HGBA1C 5.9 (H) 10/08/2020   No results found for: CHOL, HDL, LDLCALC, LDLDIRECT, TRIG, CHOLHDL  Significant Diagnostic Results in last 30 days:  VAS [place] ABI WITH/WO TBI  Result Date: 01/21/2021  LOWER EXTREMITY DOPPLER STUDY Patient Name:  XAYVIER VALLEZ  Date of Exam:   01/21/2021 Medical Rec #: [id number]      Accession #:    [id number] Date of Birth: [birth year]      Patient Gender: M Patient Age:   12 years Exam Location:  [facility] Procedure:      VAS [place] ABI WITH/WO TBI Referring Phys: MICHAEL ROBSON --------------------------------------------------------------------------------  High Risk Factors: Hypertension, hyperlipidemia. Other Factors: Non healing left heel wound.  Performing Technologist: Delorise Shiner RVT  Examination Guidelines: A complete evaluation includes at minimum, Doppler waveform signals and systolic blood pressure reading at the level of bilateral brachial, anterior tibial, and posterior tibial arteries, when vessel segments are accessible. Bilateral testing is considered an integral part of a complete examination. Photoelectric Plethysmograph (PPG) waveforms and toe systolic pressure readings are included as required and additional duplex testing as needed. Limited examinations for reoccurring indications may be performed as noted.  ABI Findings: +---------+------------------+-----+---------+--------------------+ Right    Rt Pressure (mmHg)IndexWaveform Comment              +---------+------------------+-----+---------+--------------------+ Brachial                                 Indwelling PICC line +---------+------------------+-----+---------+--------------------+ PTA      151               1.08 biphasic                      +---------+------------------+-----+---------+--------------------+  DP       158               1.13 triphasic                     +---------+------------------+-----+---------+--------------------+ Great Toe75                0.54                               +---------+------------------+-----+---------+--------------------+ +---------+------------------+-----+----------+-------+ Left     Lt Pressure (mmHg)IndexWaveform  Comment +---------+------------------+-----+----------+-------+ Brachial 140                                      +---------+------------------+-----+----------+-------+  ATA      255                    monophasic        +---------+------------------+-----+----------+-------+ PTA      255               1.82 biphasic          +---------+------------------+-----+----------+-------+ Great Toe52                0.37                   +---------+------------------+-----+----------+-------+ +-------+----------------+-----------+------------+------------+ ABI/TBIToday's ABI     Today's TBIPrevious ABIPrevious TBI +-------+----------------+-----------+------------+------------+ Right  1.13            0.54                                +-------+----------------+-----------+------------+------------+ Left   Non compressible0.37                                +-------+----------------+-----------+------------+------------+  Summary: Right: Resting right ankle-brachial index is within normal range. No evidence of significant right lower extremity arterial disease. The right toe-brachial index is abnormal. Left: Resting left ankle-brachial index indicates noncompressible left lower extremity arteries. The left toe-brachial index is abnormal.  *See table(s) above for measurements and observations.  Electronically signed by Orlie Pollen on 01/21/2021 at 7:06:40 PM.    Final    VAS [place] LOWER EXTREMITY ARTERIAL DUPLEX  Result Date: 01/21/2021 LOWER EXTREMITY ARTERIAL DUPLEX STUDY Patient Name:  DAUNDRE BIEL  Date of  Exam:   01/21/2021 Medical Rec #: [id number]      Accession #:    [id number] Date of Birth: [birth year]      Patient Gender: M Patient Age:   51 years Exam Location:  [facility] Procedure:      VAS [place] LOWER EXTREMITY ARTERIAL DUPLEX Referring Phys: Legrand Como ROBSON --------------------------------------------------------------------------------  Indications: Ulceration, and peripheral artery disease. High Risk Factors: Hypertension. Other Factors: Non healing wound left heel. MRSA.  Current ABI: Right: 1.13, Left: non compressible Performing Technologist: Delorise Shiner RVT  Examination Guidelines: A complete evaluation includes B-mode imaging, spectral Doppler, color Doppler, and power Doppler as needed of all accessible portions of each vessel. Bilateral testing is considered an integral part of a complete examination. Limited examinations for reoccurring indications may be performed as noted.   +----------+--------+-----+--------+----------+--------+ LEFT      PSV cm/sRatioStenosisWaveform  Comments +----------+--------+-----+--------+----------+--------+ CFA Distal143                  triphasic          +----------+--------+-----+--------+----------+--------+ DFA       76                   triphasic          +----------+--------+-----+--------+----------+--------+ SFA Prox  88                   triphasic          +----------+--------+-----+--------+----------+--------+ SFA Mid   90                   monophasic         +----------+--------+-----+--------+----------+--------+ SFA Distal119                  triphasic          +----------+--------+-----+--------+----------+--------+  POP Prox  53                   triphasic          +----------+--------+-----+--------+----------+--------+ POP Distal90                   triphasic          +----------+--------+-----+--------+----------+--------+ ATA Distal61                   monophasic          +----------+--------+-----+--------+----------+--------+ PTA Distal92                   monophasic         +----------+--------+-----+--------+----------+--------+ No focal hemodynamically significant stenosis identified. Waveform analysis is consistent with occlusive disease in the proximal calf tibial outflow arteries.  Summary: Left: Findings are consistent with tibial outflow disease. No other hemodynamically significant stenosis is identified.  See table(s) above for measurements and observations. Electronically signed by Orlie Pollen on 01/21/2021 at 7:06:29 PM.    Final    IR PICC PLACEMENT RIGHT >5 YRS INC IMG GUIDE  Result Date: 01/14/2021 INDICATION: Calcaneal osteomyelitis, in need of long-term IV antibiotic treatment. Request for PICC line placement. EXAM: ULTRASOUND AND FLUOROSCOPIC GUIDED PICC LINE INSERTION MEDICATIONS: 1% lidocaine CONTRAST:  None FLUOROSCOPY TIME:  24 seconds (1 mGy) COMPLICATIONS: None immediate. TECHNIQUE: The procedure, risks, benefits, and alternatives were explained to the patient and informed written consent was obtained. The right upper extremity was prepped with chlorhexidine in a sterile fashion, and a sterile drape was applied covering the operative field. Maximum barrier sterile technique with sterile gowns and gloves were used for the procedure. A timeout was performed prior to the initiation of the procedure. Local anesthesia was provided with 1% lidocaine. After the overlying soft tissues were anesthetized with 1% lidocaine, a micropuncture kit was utilized to access the right basilic vein. Real-time ultrasound guidance was utilized for vascular access including the acquisition of a permanent ultrasound image documenting patency of the accessed vessel. A guidewire was advanced to the level of the superior caval-atrial junction for measurement purposes and the PICC line was cut to length. A peel-away sheath was placed and a 43 cm, 5 [place], single lumen  was inserted to level of the superior caval-atrial junction. A post procedure spot fluoroscopic was obtained. The catheter easily aspirated and flushed and was secured in place. A dressing was placed. The patient tolerated the procedure well without immediate post procedural complication. FINDINGS: After catheter placement, the tip lies within the superior cavoatrial junction. The catheter aspirates and flushes normally and is ready for immediate use. IMPRESSION: Successful ultrasound and fluoroscopic guided placement of a right basilic vein approach, 43 cm, 5 [place], single lumen PICC with tip at the superior caval-atrial junction. The PICC line is ready for immediate use. Read by: Durenda Guthrie, PA-C Electronically Signed   By: Sandi Mariscal M.D.   On: 01/14/2021 14:57    Assessment/Plan 1. Acute on chronic congestive heart failure, unspecified heart failure type ([place]) - reports improved sob - exp wheezing/rales noted on exam - weight gain 20 lbs in past month - 2+ ankle edema and penile edema noted - CXR 01/31/2021 revealed bilateral interstitial pulmonary infiltrate and small bilateral pulmonary effusions L>R - BNP 282 01/31/2021 - start lasix 40 mg po bid x 3 days - recheck bmp 02/04/2021 - daily weights x 2 weeks, then twice weekly - cont NAS diet  2.  Acute osteomyelitis of left calcaneus ([facility]) - followed by wound care center and ID - cont daily dresing changes with Santyl and silver alginate - cont long term antibiotics via PICC line/ IV vanc  3. DVT of deep femoral vein, right ([place]) - diagnosed 12/2020 - cont Eliquis  4. Peripheral edema - LLE >RLE - see above  5. Parkinson's disease ([place])  - cont Sinemet - cont skilled nursing care  6. Primary hypertension - controlled without medication  7. Blood loss anemia - hgb 9.2  - no signs of blood loss - FOBT x 3- pending  8. Primary osteoarthritis involving multiple joints - cont tylenol for pain  9. Age-related osteoporosis  without current pathological fracture - cont calcium and vitamin d supplement  10. Slow transit constipation - cont miralax and senna    Family/ staff Communication: plan discussed with patient and nurse  Labs/tests ordered:  bmp 02/04/2021

## 2021-02-04 DIAGNOSIS — I509 Heart failure, unspecified: Secondary | ICD-10-CM | POA: Insufficient documentation

## 2021-02-04 DIAGNOSIS — I5030 Unspecified diastolic (congestive) heart failure: Secondary | ICD-10-CM | POA: Insufficient documentation

## 2021-02-04 NOTE — Assessment & Plan Note (Signed)
01/31/21 Na 134, K 4.2, Bun 27, creat 1.07, eGFR 67

## 2021-02-04 NOTE — Assessment & Plan Note (Signed)
01/31/21 CXR diffused bilateral interstitial infiltrate, small bilateral effusions L>R, CHF vs PNA. BNP 282

## 2021-02-05 ENCOUNTER — Encounter (HOSPITAL_BASED_OUTPATIENT_CLINIC_OR_DEPARTMENT_OTHER): Payer: Medicare Other | Attending: Internal Medicine | Admitting: Internal Medicine

## 2021-02-05 ENCOUNTER — Other Ambulatory Visit: Payer: Self-pay

## 2021-02-05 ENCOUNTER — Encounter (HOSPITAL_BASED_OUTPATIENT_CLINIC_OR_DEPARTMENT_OTHER): Payer: Medicare Other | Admitting: Internal Medicine

## 2021-02-05 DIAGNOSIS — Z86718 Personal history of other venous thrombosis and embolism: Secondary | ICD-10-CM | POA: Diagnosis not present

## 2021-02-05 DIAGNOSIS — L89620 Pressure ulcer of left heel, unstageable: Secondary | ICD-10-CM | POA: Diagnosis present

## 2021-02-05 DIAGNOSIS — M86172 Other acute osteomyelitis, left ankle and foot: Secondary | ICD-10-CM | POA: Insufficient documentation

## 2021-02-05 DIAGNOSIS — B9562 Methicillin resistant Staphylococcus aureus infection as the cause of diseases classified elsewhere: Secondary | ICD-10-CM | POA: Diagnosis not present

## 2021-02-05 DIAGNOSIS — G20 Parkinson's disease: Secondary | ICD-10-CM | POA: Insufficient documentation

## 2021-02-05 NOTE — Progress Notes (Signed)
ARPAN ARNWINE ([id number]) , Visit Report for 02/05/2021 HPI Details Patient Name: Date of Service: Greg [name] Greg [name] 02/05/2021 12:30 PM Medical Record Number: [id number] Patient Account Number: [id number] Date of Birth/Sex: Treating RN: 02-24-1932 (87 y.o. Greg [name], Greg [name] Primary Care Provider: Veleta [name] Other Clinician: Referring Provider: Treating Provider/Extender: Greg [name] in Treatment: 4 History of Present Illness HPI Description: ADMISSION 01/04/2021 Greg [name] is a pleasant 87 year old woman who lives at [facility] in the skilled facility. He is accompanied by his wife. I did previously been notified by Dr. Lyndel [name] primary physician about this patient. He apparently fractured his right acetabulum in July. Sometime in the recovery of this developed a pressure ulcer on the right heel. When I was first contacted by Dr. Lyndel [name] this was described as a necrotic wound and indeed this is an accurate description. Unfortunately cultures of this have shown MRSA and after 1 negative x-ray a subsequent x-ray on 01/02/2021 showed changes of osteomyelitis. The patient was started on IV vancomycin at the facility and I believe is also on oral trimethoprim/sulfamethoxazole. They are using silver alginate which was done at my suggestion. Sedimentation rate and the said facility was 55 CRP P was also elevated at 73.7 compatible with his osteomyelitis Past medical history includes a right acetabular fracture, DVT in the right femoral vein also in September on Eliquis, fairly severe Parkinson's disease. He has had [facility] in the skilled facility. ABI in our clinic was noncompressible on the left 10/11; patient presents for follow-up. He has no issues or complaints today. He saw Dr. Tommy [name] on 10/7. He is currently on IV vancomycin. He currently denies systemic signs of infection. 10/18 remains on IV vancomycin. Santyl and silver alginate to the wound bed.  [facility] [place]. His wound has made some minor progress there is not as much necrotic tissue and some viable tissue on the surface however there is still an odor. The patient had noninvasive arterial tests done at my suggestion. On the left his ABI was noncompressible his TBI was 0.37 with monophasic and biphasic waveforms. Arterial Doppler showed monophasic waveforms at the ATA and PTA. However there was no focally hemodynamic significant stenosis identified. 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 Remains on IV vancomycin difficult to get consistent answers but it seems like they were not interested in the vascular surgery consult at this time. The wound itself somewhat improved in terms of the amount of necrotic material but still a very difficult necrotic wound. There is exposed bone. We are using Santyl and silver alginate Electronic Signature(s) Signed: 02/05/2021 4:29:54 PM By: Linton Ham MD Entered By: Linton Ham on 02/05/2021 13:39:14 -------------------------------------------------------------------------------- Physical Exam Details Patient Name: Date of Service: Greg [name], [name] 02/05/2021 12:30 PM Medical Record Number: [id number] Patient Account Number: [id number] Date of Birth/Sex: Treating RN: [birth year] (87 y.o. Greg [name], Greg [name] Primary Care Provider: Veleta [name] Other Clinician: Referring Provider: Treating Provider/Extender: Greg [name] in Treatment: 4 Constitutional Sitting or standing Blood Pressure is within target range for patient.. Pulse regular and within target range for patient.Marland Kitchen Respirations regular, non-labored and within target range.. Temperature is normal and within the target range for the patient.Marland Kitchen Appears in no distress. Respiratory Above normal respiratory effort noted. Respiratory rate elveaated. Wheezing. Air entry fairly clear. Cardiovascular Slightly  tachycardic jugular venous pressure up. Edema present in both extremities. Extending well up into the thighs bilaterally. Notes  Wound exam; left heel still most of this has a nonviable surface. There is exposed bone. Not much in the way of viable tissue. Electronic Signature(s) Signed: 02/05/2021 4:29:54 PM By: Baltazar Najjar MD Entered By: Baltazar Najjar on 02/05/2021 13:40:26 -------------------------------------------------------------------------------- Physician Orders Details Patient Name: Date of Service: Greg [name], [name] 02/05/2021 12:30 PM Medical Record Number: [id number] Patient Account Number: [id number] Date of Birth/Sex: Treating RN: [birth year] (87 y.o. Greg [name] Primary Care Provider: Einar [name] Other Clinician: Referring Provider: Treating Provider/Extender: Greg [name] in Treatment: 4 Verbal / Phone Orders: No Diagnosis Coding ICD-10 Coding Code Description [phone] Pressure ulcer of left heel, unstageable M86.172 Other acute osteomyelitis, left ankle and foot B95.62 Methicillin resistant Staphylococcus aureus infection as the cause of diseases classified elsewhere Follow-up Appointments ppointment in 2 weeks. - Dr. Leanord [name] Return A Bathing/ Shower/ Hygiene May shower with protection but do not get wound dressing(s) wet. Edema Control - Lymphedema / SCD / Other Elevate legs to the level of the heart or above for 30 minutes daily and/or when sitting, a frequency of: - throughout the day Off-Loading Turn and reposition every 2 hours Other: - Float heels off of bed/chair with pillow under calves Wound Treatment Wound #1 - Calcaneus Wound Laterality: Left Cleanser: Wound Cleanser 1 x Per Day/15 Days Discharge Instructions: Cleanse the wound with wound cleanser prior to applying a clean dressing using gauze sponges, not tissue or cotton balls. Prim Dressing: KerraCel Ag Gelling Fiber Dressing, 4x5 in (silver alginate) 1 x Per Day/15  Days ary Discharge Instructions: Apply silver alginate to wound bed, over Santyl Prim Dressing: Santyl Ointment 1 x Per Day/15 Days ary Discharge Instructions: Apply nickel thick amount to wound bed, under silver alginate Secondary Dressing: Woven Gauze Sponge, Non-Sterile 4x4 in 1 x Per Day/15 Days Discharge Instructions: Apply over primary dressing as directed. Secondary Dressing: ALLEVYN Heel 4 1/2in x 5 1/2in / 10.5cm x 13.5cm 1 x Per Day/15 Days Discharge Instructions: Apply over primary dressing as directed. Secured With: [employer], 4.5x3.1 (in/yd) 1 x Per Day/15 Days Discharge Instructions: Secure with Kerlix as directed. Secured With: Paper Tape, 2x10 (in/yd) 1 x Per Day/15 Days Discharge Instructions: Secure dressing with tape as directed. Electronic Signature(s) Signed: 02/05/2021 4:29:54 PM By: Baltazar Najjar MD Signed: 02/05/2021 5:02:43 PM By: Antonieta Iba Entered By: Antonieta Iba on 02/05/2021 13:17:35 -------------------------------------------------------------------------------- Problem List Details Patient Name: Date of Service: Greg [name], [name] 02/05/2021 12:30 PM Medical Record Number: [id number] Patient Account Number: [id number] Date of Birth/Sex: Treating RN: 04-03-1932 (87 y.o. Greg [name] Primary Care Provider: Einar [name] Other Clinician: Referring Provider: Treating Provider/Extender: Greg [name] in Treatment: 4 Active Problems ICD-10 Encounter Code Description Active Date MDM Diagnosis L89.620 Pressure ulcer of left heel, unstageable 01/04/2021 No Yes M86.172 Other acute osteomyelitis, left ankle and foot 01/04/2021 No Yes B95.62 Methicillin resistant Staphylococcus aureus infection as the cause of diseases 01/04/2021 No Yes classified elsewhere Inactive Problems Resolved Problems Electronic Signature(s) Signed: 02/05/2021 4:29:54 PM By: Baltazar Najjar MD Entered By: Baltazar Najjar on 02/05/2021  13:38:07 -------------------------------------------------------------------------------- Progress Note Details Patient Name: Date of Service: Greg [name], [name] 02/05/2021 12:30 PM Medical Record Number: [id number] Patient Account Number: [id number] Date of Birth/Sex: Treating RN: July 09, 1931 (87 y.o. Greg [name], Greg [name] Primary Care Provider: Einar [name] Other Clinician: Referring Provider: Treating Provider/Extender: Greg [name] in Treatment: 4 Subjective History of Present Illness (HPI) ADMISSION 01/04/2021 Greg [name] is a pleasant 87 year old woman who  lives at [facility] in the skilled facility. He is accompanied by his wife. I did previously been notified by Dr. Lyndel [name] primary physician about this patient. He apparently fractured his right acetabulum in July. Sometime in the recovery of this developed a pressure ulcer on the right heel. When I was first contacted by Dr. Lyndel [name] this was described as a necrotic wound and indeed this is an accurate description. Unfortunately cultures of this have shown MRSA and after 1 negative x-ray a subsequent x-ray on 01/02/2021 showed changes of osteomyelitis. The patient was started on IV vancomycin at the facility and I believe is also on oral trimethoprim/sulfamethoxazole. They are using silver alginate which was done at my suggestion. Sedimentation rate and the said facility was 55 CRP P was also elevated at 73.7 compatible with his osteomyelitis Past medical history includes a right acetabular fracture, DVT in the right femoral vein also in September on Eliquis, fairly severe Parkinson's disease. He has had [facility] [place] in the skilled facility. ABI in our clinic was noncompressible on the left 10/11; patient presents for follow-up. He has no issues or complaints today. He saw Dr. Tommy [name] on 10/7. He is currently on IV vancomycin. He currently denies systemic signs of infection. 10/18 remains on IV vancomycin.  Santyl and silver alginate to the wound bed. [facility] is at the [facility] [place]. His wound has made some minor progress there is not as much necrotic tissue and some viable tissue on the surface however there is still an odor. The patient had noninvasive arterial tests done at my suggestion. On the left his ABI was noncompressible his TBI was 0.37 with monophasic and biphasic waveforms. Arterial Doppler showed monophasic waveforms at the ATA and PTA. However there was no focally hemodynamic significant stenosis identified. 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 Remains on IV vancomycin difficult to get consistent answers but it seems like they were not interested in the vascular surgery consult at this time. The wound itself somewhat improved in terms of the amount of necrotic material but still a very difficult necrotic wound. There is exposed bone. We are using Santyl and silver alginate Objective Constitutional Sitting or standing Blood Pressure is within target range for patient.. Pulse regular and within target range for patient.Marland Kitchen Respirations regular, non-labored and within target range.. Temperature is normal and within the target range for the patient.Marland Kitchen Appears in no distress. Vitals Time Taken: 12:59 PM, Height: 73 in, Weight: 180 lbs, BMI: 23.7, Temperature: 97.9 F, Pulse: 82 bpm, Respiratory Rate: 16 breaths/min, Blood Pressure: 135/73 mmHg. Respiratory Above normal respiratory effort noted. Respiratory rate elveaated. Wheezing. Air entry fairly clear. Cardiovascular Slightly tachycardic jugular venous pressure up. Edema present in both extremities. Extending well up into the thighs bilaterally. General Notes: Wound exam; left heel still most of this has a nonviable surface. There is exposed bone. Not much in the way of viable tissue. Integumentary (Hair, Skin) Wound #1 status is Open. Original cause of wound was Blister. The date acquired was:  10/24/2020. The wound has been in treatment 4 weeks. The wound is located on the Left Calcaneus. The wound measures 5cm length x 5.6cm width x 0.8cm depth; 21.991cm^2 area and 17.593cm^3 volume. There is Fat Layer (Subcutaneous Tissue) exposed. There is no tunneling or undermining noted. There is a large amount of purulent drainage noted. Foul odor after cleansing was noted. The wound margin is distinct with the outline attached to the wound base. There  is small (1-33%) granulation within the wound bed. There is a large (67- 100%) amount of necrotic tissue within the wound bed including Eschar and Adherent Slough. Assessment Active Problems ICD-10 Pressure ulcer of left heel, unstageable Other acute osteomyelitis, left ankle and foot Methicillin resistant Staphylococcus aureus infection as the cause of diseases classified elsewhere Plan Follow-up Appointments: Return Appointment in 2 weeks. - Dr. Dellia Nims Bathing/ Shower/ Hygiene: May shower with protection but do not get wound dressing(s) wet. Edema Control - Lymphedema / SCD / Other: Elevate legs to the level of the heart or above for 30 minutes daily and/or when sitting, a frequency of: - throughout the day Off-Loading: Turn and reposition every 2 hours Other: - Float heels off of bed/chair with pillow under calves WOUND #1: - Calcaneus Wound Laterality: Left Cleanser: Wound Cleanser 1 x Per Day/15 Days Discharge Instructions: Cleanse the wound with wound cleanser prior to applying a clean dressing using gauze sponges, not tissue or cotton balls. Prim Dressing: KerraCel Ag Gelling Fiber Dressing, 4x5 in (silver alginate) 1 x Per Day/15 Days ary Discharge Instructions: Apply silver alginate to wound bed, over Santyl Prim Dressing: Santyl Ointment 1 x Per Day/15 Days ary Discharge Instructions: Apply nickel thick amount to wound bed, under silver alginate Secondary Dressing: Woven Gauze Sponge, Non-Sterile 4x4 in 1 x Per Day/15  Days Discharge Instructions: Apply over primary dressing as directed. Secondary Dressing: ALLEVYN Heel 4 1/2in x 5 1/2in / 10.5cm x 13.5cm 1 x Per Day/15 Days Discharge Instructions: Apply over primary dressing as directed. Secured With: [employer], 4.5x3.1 (in/yd) 1 x Per Day/15 Days Discharge Instructions: Secure with Kerlix as directed. Secured With: Paper T ape, 2x10 (in/yd) 1 x Per Day/15 Days Discharge Instructions: Secure dressing with tape as directed. #1 I have continued the same dressing 2. The patient seems quite clear about not wanting to proceed with invasive vascular work-up 3. At least for now he does not have pain or extending infection. There does not seem to be any need for consideration of amputation at the moment but I told the patient and his wife that this could change quickly 4. With regards to the tachypnea, wheezing. I think he is probably fluid overloaded. He is on Lasix 20 mg I have put in a text to his primary doctor Dr. Lyndel [name] Electronic Signature(s) Signed: 02/05/2021 4:29:54 PM By: Linton Ham MD Entered By: Linton Ham on 02/05/2021 13:41:42 -------------------------------------------------------------------------------- SuperBill Details Patient Name: Date of Service: Greg [name], [name] 02/05/2021 Medical Record Number: [id number] Patient Account Number: [id number] Date of Birth/Sex: Treating RN: Sep 04, 1931 (87 y.o. Marcheta Grammes Primary Care Provider: Veleta [name] Other Clinician: Referring Provider: Treating Provider/Extender: Greg [name] in Treatment: 4 Diagnosis Coding ICD-10 Codes Code Description [phone] Pressure ulcer of left heel, unstageable M86.172 Other acute osteomyelitis, left ankle and foot B95.62 Methicillin resistant Staphylococcus aureus infection as the cause of diseases classified elsewhere Facility Procedures CPT4 Code: [id number] Description: 99213 - WOUND CARE VISIT-LEV 3 EST  PT Modifier: Quantity: 1 Physician Procedures : CPT4 Code Description Modifier [id number] - WC PHYS LEVEL 3 - EST PT ICD-10 Diagnosis Description L89.620 Pressure ulcer of left heel, unstageable M86.172 Other acute osteomyelitis, left ankle and foot Quantity: 1 Electronic Signature(s) Signed: 02/05/2021 4:29:54 PM By: Linton Ham MD Entered By: Linton Ham on 02/05/2021 13:42:01

## 2021-02-05 NOTE — Progress Notes (Signed)
Greg [name] ([id number]) , Visit Report for 02/05/2021 Arrival Information Details Patient Name: Date of Service: Greg [name] [facility] 02/05/2021 12:30 PM Medical Record Number: [id number] Patient Account Number: [id number] Date of Birth/Sex: Treating RN: Jun 09, 1931 (87 y.o. Greg [name] Primary Care Shalona Harbour: Veleta Miners Other Clinician: Referring Lurline Caver: Treating Reneka Nebergall/Extender: Carley Hammed in Treatment: 4 Visit Information History Since Last Visit Added or deleted any medications: No Patient Arrived: Wheel Chair Any new allergies or adverse reactions: No Arrival Time: 12:56 Had a fall or experienced change in No Accompanied By: wife activities of daily living that may affect Transfer Assistance: Manual risk of falls: Patient Identification Verified: Yes Signs or symptoms of abuse/neglect since last visito No Secondary Verification Process Completed: Yes Hospitalized since last visit: No Patient Requires Transmission-Based Precautions: No Implantable device outside of the clinic excluding No Patient Has Alerts: Yes cellular tissue based products placed in the center Patient Alerts: Patient on Blood Thinner since last visit: L ABI non compressible Has Dressing in Place as Prescribed: Yes Pain Present Now: No Electronic Signature(s) Signed: 02/05/2021 5:02:43 PM By: Lorrin Jackson Entered By: Lorrin Jackson on 02/05/2021 12:57:32 -------------------------------------------------------------------------------- Clinic Level of Care Assessment Details Patient Name: Date of Service: Greg [name] [facility] 02/05/2021 12:30 PM Medical Record Number: [id number] Patient Account Number: [id number] Date of Birth/Sex: Treating RN: 1931-04-15 (87 y.o. Greg [name] Primary Care Harmoney Sienkiewicz: Veleta Miners Other Clinician: Referring Andrius Andrepont: Treating Anna-Marie Coller/Extender: Carley Hammed in Treatment: 4 Clinic Level of Care Assessment  Items TOOL 4 Quantity Score X- 1 0 Use when only an EandM is performed on FOLLOW-UP visit ASSESSMENTS - Nursing Assessment / Reassessment X- 1 10 Reassessment of Co-morbidities (includes updates in patient status) X- 1 5 Reassessment of Adherence to Treatment Plan ASSESSMENTS - Wound and Skin A ssessment / Reassessment X - Simple Wound Assessment / Reassessment - one wound 1 5 '[]'  - 0 Complex Wound Assessment / Reassessment - multiple wounds '[]'  - 0 Dermatologic / Skin Assessment (not related to wound area) ASSESSMENTS - Focused Assessment '[]'  - 0 Circumferential Edema Measurements - multi extremities '[]'  - 0 Nutritional Assessment / Counseling / Intervention '[]'  - 0 Lower Extremity Assessment (monofilament, tuning fork, pulses) '[]'  - 0 Peripheral Arterial Disease Assessment (using hand held doppler) ASSESSMENTS - Ostomy and/or Continence Assessment and Care '[]'  - 0 Incontinence Assessment and Management '[]'  - 0 Ostomy Care Assessment and Management (repouching, etc.) PROCESS - Coordination of Care '[]'  - 0 Simple Patient / Family Education for ongoing care X- 1 20 Complex (extensive) Patient / Family Education for ongoing care '[]'  - 0 Staff obtains Programmer, systems, Records, T Results / Process Orders est X- 1 10 Staff telephones HHA, Nursing Homes / Clarify orders / etc '[]'  - 0 Routine Transfer to another Facility (non-emergent condition) '[]'  - 0 Routine Hospital Admission (non-emergent condition) '[]'  - 0 New Admissions / Biomedical engineer / Ordering NPWT Apligraf, etc. , '[]'  - 0 Emergency Hospital Admission (emergent condition) '[]'  - 0 Simple Discharge Coordination '[]'  - 0 Complex (extensive) Discharge Coordination PROCESS - Special Needs '[]'  - 0 Pediatric / Minor Patient Management '[]'  - 0 Isolation Patient Management '[]'  - 0 Hearing / Language / Visual special needs '[]'  - 0 Assessment of Community assistance (transportation, D/C planning, etc.) '[]'  - 0 Additional  assistance / Altered mentation '[]'  - 0 Support Surface(s) Assessment (bed, cushion, seat, etc.) INTERVENTIONS - Wound Cleansing / Measurement X - Simple Wound Cleansing - one wound 1 5 '[]'  -  0 Complex Wound Cleansing - multiple wounds X- 1 5 Wound Imaging (photographs - any number of wounds) '[]'  - 0 Wound Tracing (instead of photographs) X- 1 5 Simple Wound Measurement - one wound '[]'  - 0 Complex Wound Measurement - multiple wounds INTERVENTIONS - Wound Dressings '[]'  - 0 Small Wound Dressing one or multiple wounds X- 1 15 Medium Wound Dressing one or multiple wounds '[]'  - 0 Large Wound Dressing one or multiple wounds '[]'  - 0 Application of Medications - topical '[]'  - 0 Application of Medications - injection INTERVENTIONS - Miscellaneous '[]'  - 0 External ear exam '[]'  - 0 Specimen Collection (cultures, biopsies, blood, body fluids, etc.) '[]'  - 0 Specimen(s) / Culture(s) sent or taken to Lab for analysis '[]'  - 0 Patient Transfer (multiple staff / Civil Service fast streamer / Similar devices) '[]'  - 0 Simple Staple / Suture removal (25 or less) '[]'  - 0 Complex Staple / Suture removal (26 or more) '[]'  - 0 Hypo / Hyperglycemic Management (close monitor of Blood Glucose) '[]'  - 0 Ankle / Brachial Index (ABI) - do not check if billed separately X- 1 5 Vital Signs Has the patient been seen at the hospital within the last three years: Yes Total Score: 85 Level Of Care: New/Established - Level 3 Electronic Signature(s) Signed: 02/05/2021 5:02:43 PM By: Lorrin Jackson Entered By: Lorrin Jackson on 02/05/2021 13:20:50 -------------------------------------------------------------------------------- Encounter Discharge Information Details Patient Name: Date of Service: Greg [name], [name] 02/05/2021 12:30 PM Medical Record Number: [id number] Patient Account Number: [id number] Date of Birth/Sex: Treating RN: 12-Jul-1931 (87 y.o. Greg [name] Primary Care Lennon Boutwell: Veleta Miners Other Clinician: Referring  Mitchell Epling: Treating Weston Kallman/Extender: Carley Hammed in Treatment: 4 Encounter Discharge Information Items Discharge Condition: Stable Ambulatory Status: Wheelchair Discharge Destination: Skilled Nursing Facility Orders Sent: Yes Transportation: Other Accompanied By: wife Schedule Follow-up Appointment: Yes Clinical Summary of Care: Provided on 02/05/2021 Form Type Recipient Paper Patient Patient Electronic Signature(s) Signed: 02/05/2021 2:06:01 PM By: Lorrin Jackson Entered By: Lorrin Jackson on 02/05/2021 14:06:01 -------------------------------------------------------------------------------- Lower Extremity Assessment Details Patient Name: Date of Service: Greg [name] GER 02/05/2021 12:30 PM Medical Record Number: [id number] Patient Account Number: [id number] Date of Birth/Sex: Treating RN: 1931-10-22 (87 y.o. Greg [name] Primary Care Maricia Scotti: Veleta Miners Other Clinician: Referring Bernece Gall: Treating Elliot Simoneaux/Extender: Carley Hammed in Treatment: 4 Edema Assessment Assessed: [name]: Yes] [name]: No] Edema: [Left: Ye] [Right: s] Calf Left: Right: Point of Measurement: 36 cm From Medial Instep 44 cm Ankle Left: Right: Point of Measurement: 11 cm From Medial Instep 29 cm Vascular Assessment Pulses: Dorsalis Pedis Palpable: [Left:Yes] Electronic Signature(s) Signed: 02/05/2021 5:02:43 PM By: Lorrin Jackson Entered By: Lorrin Jackson on 02/05/2021 13:06:53 -------------------------------------------------------------------------------- Multi Wound Chart Details Patient Name: Date of Service: Greg [name], [name] 02/05/2021 12:30 PM Medical Record Number: [id number] Patient Account Number: [id number] Date of Birth/Sex: Treating RN: 16-Aug-1931 (87 y.o. Burnadette Pop, [name] Primary Care Jaidynn Balster: Veleta Miners Other Clinician: Referring Celestial Barnfield: Treating Tawn Fitzner/Extender: Carley Hammed in  Treatment: 4 Vital Signs Height(in): 73 Pulse(bpm): 40 Weight(lbs): 180 Blood Pressure(mmHg): 135/73 Body Mass Index(BMI): 24 Temperature(F): 97.9 Respiratory Rate(breaths/min): 16 Photos: [N/A:N/A] Left Calcaneus N/A N/A Wound Location: Blister N/A N/A Wounding Event: Pressure Ulcer N/A N/A Primary Etiology: Hypertension, Osteoarthritis N/A N/A Comorbid History: 10/24/2020 N/A N/A Date Acquired: 4 N/A N/A Weeks of Treatment: Open N/A N/A Wound Status: 5x5.6x0.8 N/A N/A Measurements L x W x D (cm) 21.991 N/A N/A A (cm) : rea 17.593 N/A N/A Volume (  cm) : 53.30% N/A N/A % Reduction in A rea: -273.40% N/A N/A % Reduction in Volume: Unstageable/Unclassified N/A N/A Classification: Large N/A N/A Exudate A mount: Purulent N/A N/A Exudate Type: yellow, brown, green N/A N/A Exudate Color: Yes N/A N/A Foul Odor A Cleansing: fter No N/A N/A Odor A nticipated Due to Product Use: Distinct, outline attached N/A N/A Wound Margin: Small (1-33%) N/A N/A Granulation A mount: Large (67-100%) N/A N/A Necrotic A mount: Eschar, Adherent Slough N/A N/A Necrotic Tissue: Fat Layer (Subcutaneous Tissue): Yes N/A N/A Exposed Structures: Fascia: No Tendon: No Muscle: No Joint: No Bone: No Small (1-33%) N/A N/A Epithelialization: Treatment Notes Electronic Signature(s) Signed: 02/05/2021 4:29:54 PM By: Linton Ham MD Signed: 02/05/2021 4:48:23 PM By: Rhae Hammock RN Entered By: Linton Ham on 02/05/2021 13:38:20 -------------------------------------------------------------------------------- Multi-Disciplinary Care Plan Details Patient Name: Date of Service: Greg [name], [place] GER 02/05/2021 12:30 PM Medical Record Number: [id number] Patient Account Number: [id number] Date of Birth/Sex: Treating RN: July 11, 1931 (87 y.o. Greg [name] Primary Care Kellyjo Edgren: Veleta Miners Other Clinician: Referring Ira Dougher: Treating Audria Takeshita/Extender: Carley Hammed in Treatment: 4 Multidisciplinary Care Plan reviewed with physician Active Inactive Tissue Oxygenation Nursing Diagnoses: Actual ineffective tissue perfusion; peripheral (select once diagnosis is confirmed) Goals: Non-invasive arterial studies are completed as ordered Date Initiated: 01/22/2021 Target Resolution Date: 01/22/2021 Goal Status: Active Interventions: Assess patient understanding of disease process and management upon diagnosis and as needed Assess peripheral arterial status upon admission and as needed Provide education on tissue oxygenation and ischemia Treatment Activities: Ankle Brachial Index (ABI) : 01/22/2021 Non-invasive vascular studies : 01/22/2021 Notes: Wound/Skin Impairment Nursing Diagnoses: Impaired tissue integrity Knowledge deficit related to ulceration/compromised skin integrity Goals: Patient/caregiver will verbalize understanding of skin care regimen Date Initiated: 01/04/2021 Date Inactivated: 02/05/2021 Target Resolution Date: 02/01/2021 Goal Status: Met Ulcer/skin breakdown will have a volume reduction of 30% by week 4 Date Initiated: 02/05/2021 Target Resolution Date: 02/19/2021 Goal Status: Active Interventions: Assess patient/caregiver ability to obtain necessary supplies Assess patient/caregiver ability to perform ulcer/skin care regimen upon admission and as needed Assess ulceration(s) every visit Provide education on ulcer and skin care Notes: 02/05/21: Wound not yet at 30% volume reduction, patient has infection and receiving IV antibiotics. Electronic Signature(s) Signed: 02/05/2021 5:02:43 PM By: Lorrin Jackson Entered By: Lorrin Jackson on 02/05/2021 13:04:36 -------------------------------------------------------------------------------- Pain Assessment Details Patient Name: Date of Service: Greg [name] GER 02/05/2021 12:30 PM Medical Record Number: [id number] Patient Account Number: [id number] Date  of Birth/Sex: Treating RN: September 11, 1931 (87 y.o. Greg [name] Primary Care Nedda Gains: Veleta Miners Other Clinician: Referring Carols Clemence: Treating Tonyetta Berko/Extender: Carley Hammed in Treatment: 4 Active Problems Location of Pain Severity and Description of Pain Patient Has Paino Yes Site Locations Pain Location: Pain in Ulcers With Dressing Change: Yes Duration of the Pain. Constant / Intermittento Intermittent Rate the pain. Current Pain Level: 4 Character of Pain Describe the Pain: Tender, Throbbing Pain Management and Medication Current Pain Management: Medication: Yes Cold Application: No Rest: Yes Massage: No Activity: No T.E.N.S.: No Heat Application: No Leg drop or elevation: No Is the Current Pain Management Adequate: Adequate How does your wound impact your activities of daily livingo Sleep: No Bathing: No Appetite: No Relationship With Others: No Bladder Continence: No Emotions: No Bowel Continence: No Work: No Toileting: No Drive: No Dressing: No Hobbies: No Electronic Signature(s) Signed: 02/05/2021 5:02:43 PM By: Lorrin Jackson Entered By: Lorrin Jackson on 02/05/2021 13:00:24 -------------------------------------------------------------------------------- Patient/Caregiver Education Details Patient Name: Date of Service: Jeanmarie Hubert  TT, [name] 11/1/2022andnbsp12:30 PM Medical Record Number: [id number] Patient Account Number: [id number] Date of Birth/Gender: Treating RN: [birth year] (87 y.o. Greg [name] Primary Care Physician: Veleta Miners Other Clinician: Referring Physician: Treating Physician/Extender: Carley Hammed in Treatment: 4 Education Assessment Education Provided To: Patient and Caregiver Education Topics Provided Offloading: Methods: Explain/Verbal, Printed Responses: State content correctly Wound/Skin Impairment: Methods: Explain/Verbal, Printed Responses: State content  correctly Electronic Signature(s) Signed: 02/05/2021 5:02:43 PM By: Lorrin Jackson Entered By: Lorrin Jackson on 02/05/2021 13:04:59 -------------------------------------------------------------------------------- Wound Assessment Details Patient Name: Date of Service: Greg [name] GER 02/05/2021 12:30 PM Medical Record Number: [id number] Patient Account Number: [id number] Date of Birth/Sex: Treating RN: Jan 20, 1932 (87 y.o. Greg [name] Primary Care Pascuala Klutts: Veleta Miners Other Clinician: Referring Nusaybah Ivie: Treating Kenitha Glendinning/Extender: Carley Hammed in Treatment: 4 Wound Status Wound Number: 1 Primary Etiology: Pressure Ulcer Wound Location: Left Calcaneus Wound Status: Open Wounding Event: Blister Comorbid History: Hypertension, Osteoarthritis Date Acquired: 10/24/2020 Weeks Of Treatment: 4 Clustered Wound: No Photos Wound Measurements Length: (cm) 5 Width: (cm) 5.6 Depth: (cm) 0.8 Area: (cm) 21.991 Volume: (cm) 17.593 % Reduction in Area: 53.3% % Reduction in Volume: -273.4% Epithelialization: Small (1-33%) Tunneling: No Undermining: No Wound Description Classification: Unstageable/Unclassified Wound Margin: Distinct, outline attached Exudate Amount: Large Exudate Type: Purulent Exudate Color: yellow, brown, green Foul Odor After Cleansing: Yes Due to Product Use: No Slough/Fibrino Yes Wound Bed Granulation Amount: Small (1-33%) Exposed Structure Necrotic Amount: Large (67-100%) Fascia Exposed: No Necrotic Quality: Eschar, Adherent Slough Fat Layer (Subcutaneous Tissue) Exposed: Yes Tendon Exposed: No Muscle Exposed: No Joint Exposed: No Bone Exposed: No Treatment Notes Wound #1 (Calcaneus) Wound Laterality: Left Cleanser Wound Cleanser Discharge Instruction: Cleanse the wound with wound cleanser prior to applying a clean dressing using gauze sponges, not tissue or cotton balls. Peri-Wound Care Topical Primary  Dressing KerraCel Ag Gelling Fiber Dressing, 4x5 in (silver alginate) Discharge Instruction: Apply silver alginate to wound bed, over Santyl Santyl Ointment Discharge Instruction: Apply nickel thick amount to wound bed, under silver alginate Secondary Dressing Woven Gauze Sponge, Non-Sterile 4x4 in Discharge Instruction: Apply over primary dressing as directed. ALLEVYN Heel 4 1/2in x 5 1/2in / 10.5cm x 13.5cm Discharge Instruction: Apply over primary dressing as directed. Secured With [employer], 4.5x3.1 (in/yd) Discharge Instruction: Secure with Kerlix as directed. Paper Tape, 2x10 (in/yd) Discharge Instruction: Secure dressing with tape as directed. Compression Wrap Compression Stockings Add-Ons Electronic Signature(s) Signed: 02/05/2021 4:48:23 PM By: Rhae Hammock RN Signed: 02/05/2021 5:02:43 PM By: Lorrin Jackson Entered By: Rhae Hammock on 02/05/2021 13:02:59 -------------------------------------------------------------------------------- Vitals Details Patient Name: Date of Service: Greg [name], [name] 02/05/2021 12:30 PM Medical Record Number: [id number] Patient Account Number: [id number] Date of Birth/Sex: Treating RN: 05-25-1931 (87 y.o. Greg [name] Primary Care Estellar Cadena: Veleta Miners Other Clinician: Referring Fidela Cieslak: Treating Ife Vitelli/Extender: Carley Hammed in Treatment: 4 Vital Signs Time Taken: 12:59 Temperature (F): 97.9 Height (in): 73 Pulse (bpm): 82 Weight (lbs): 180 Respiratory Rate (breaths/min): 16 Body Mass Index (BMI): 23.7 Blood Pressure (mmHg): 135/73 Reference Range: 80 - 120 mg / dl Electronic Signature(s) Signed: 02/05/2021 5:02:43 PM By: Lorrin Jackson Entered By: Lorrin Jackson on 02/05/2021 12:59:48

## 2021-02-07 LAB — BASIC METABOLIC PANEL
BUN: 26 — AB (ref 4–21)
CO2: 27 — AB (ref 13–22)
Chloride: 100 (ref 99–108)
Creatinine: 1 (ref 0.6–1.3)
Glucose: 101
Potassium: 4.5 (ref 3.4–5.3)
Sodium: 134 — AB (ref 137–147)

## 2021-02-07 LAB — COMPREHENSIVE METABOLIC PANEL: Calcium: 8.3 — AB (ref 8.7–10.7)

## 2021-02-08 ENCOUNTER — Other Ambulatory Visit (HOSPITAL_COMMUNITY): Payer: Self-pay | Admitting: Internal Medicine

## 2021-02-08 ENCOUNTER — Non-Acute Institutional Stay (SKILLED_NURSING_FACILITY): Payer: Medicare Other | Admitting: Orthopedic Surgery

## 2021-02-08 ENCOUNTER — Encounter: Payer: Self-pay | Admitting: Orthopedic Surgery

## 2021-02-08 DIAGNOSIS — I10 Essential (primary) hypertension: Secondary | ICD-10-CM

## 2021-02-08 DIAGNOSIS — I509 Heart failure, unspecified: Secondary | ICD-10-CM

## 2021-02-08 DIAGNOSIS — M86172 Other acute osteomyelitis, left ankle and foot: Secondary | ICD-10-CM

## 2021-02-08 DIAGNOSIS — I82411 Acute embolism and thrombosis of right femoral vein: Secondary | ICD-10-CM

## 2021-02-08 DIAGNOSIS — M810 Age-related osteoporosis without current pathological fracture: Secondary | ICD-10-CM

## 2021-02-08 DIAGNOSIS — M159 Polyosteoarthritis, unspecified: Secondary | ICD-10-CM

## 2021-02-08 DIAGNOSIS — J189 Pneumonia, unspecified organism: Secondary | ICD-10-CM

## 2021-02-08 DIAGNOSIS — K5901 Slow transit constipation: Secondary | ICD-10-CM

## 2021-02-08 DIAGNOSIS — G20 Parkinson's disease: Secondary | ICD-10-CM

## 2021-02-08 DIAGNOSIS — R609 Edema, unspecified: Secondary | ICD-10-CM

## 2021-02-08 DIAGNOSIS — D500 Iron deficiency anemia secondary to blood loss (chronic): Secondary | ICD-10-CM

## 2021-02-08 NOTE — Progress Notes (Signed)
Location:   [facility] Room Number: 27 Place of Service:  SNF [id number]) Provider:  Windell Moulding, NP  Virgie Dad, MD  Patient Care Team: Virgie Dad, MD as PCP - General (Internal Medicine) [name], Eustace Quail, DO as Consulting Physician (Neurology)  Extended Emergency Contact Information Primary Emergency Contact: Vergia Alcon Address: [place].          apt [phone] Johnnette Litter of [place] Phone: [phone] Mobile Phone: [phone] Relation: Spouse Secondary Emergency Contact: [place], [place] Mobile Phone: [phone] Relation: Son  Code Status:  FULL CODE Goals of care: Advanced Directive information Advanced Directives 02/08/2021  Does Patient Have a Medical Advance Directive? Yes  Type of Advance Directive Living will  Does patient want to make changes to medical advance directive? No - Patient declined  Would patient like information on creating a medical advance directive? -     Chief Complaint  Patient presents with   Acute Visit    Bilateral lower extremity swelling    HPI:  Pt is a 87 y.o. male seen today for an acute visit for bilateral leg swelling and cough. Last week, CXR revealed bilateral interstitial pulmonary infiltrate and small bilateral pulmonary effusions, left greater than right, suggestive of pneumonia versus CHF. BNP 282 01/31/2021. Lasix increased to 40 mg bid, daily weights started and labs ordered. His weight continues to increase. O2 sats> 90%, remains on room air. He now reports his cough is productive, sputum brown and thick. He continues to be short of breath, worse with exertion. 2-3+ pitting edema to BLE. Penile edema unchanged from last week. Remains on NAS diet.   Left heel wound- diagnosed with left calcaneous osteomyelitis, wound culture indicated MRSA, followed by wound care center and ID, PICC line with Vanc, dressing changes Santyl and silver alginate daily, ABI completed no stenosis-  consistent with tibial outflow disease, 11/01 seen by Dr. Dellia Nims reports less necrotic tissue/ bone is exposed, remains extensive necrotic wound, patient refused vascular surgery consult DVT of deep femoral vein- diagnosed 12/2020, remains on Eliquis Parkinson's- followed by neurology, remains on sinemet HTN- BUN/creat 26/1.02 02/07/2021, off carvedilol at this time Anemia- hgb 9.3 02/04/2021, no apparent blood loss at this time,  Osteoporosis- ORIF right hip 10/10/2020, ambulating with walker, stopped fosamax 2018 (6 years use), remains on calcium and vitamin D supplements Constipation- LBM 11/03, remains on miralax and senna  Recent weights:  11/04- 207.5 lbs  11/03- 204.1 lbs  10/31- 204.6 lbs  10/28- 203.4 lbs  10/21- 193.8 lbs      Past Medical History:  Diagnosis Date   Acute bronchiolitis    Acute osteomyelitis of calcaneum, left ([place]) 01/11/2021   Anemia    Bursitis of both hips    Hearing reduced    hearing aid   History of basal cell carcinoma    Hyperlipidemia    Malaise and fatigue    MRSA infection 01/11/2021   Osteoarthritis    hand   Osteoporosis    Palpitation    Vitamin D deficiency    Past Surgical History:  Procedure Laterality Date   CATARACT EXTRACTION  05/31/2015   Eye/right   COLONOSCOPY  02/05/2005, 03/06/2005   normal, 10 years ago   MOHS micrographic surgery     Face surgery   ORIF ACETABULAR FRACTURE Right 10/09/2020   Procedure: OPEN REDUCTION INTERNAL FIXATION (ORIF) ACETABULAR FRACTURE;  Surgeon: Altamese Vina, MD;  Location: [facility];  Service: Orthopedics;  Laterality:  Right;    No Known Allergies  Allergies as of 02/08/2021   No Known Allergies      Medication List        Accurate as of February 08, 2021  9:06 AM. If you have any questions, ask your nurse or doctor.          STOP taking these medications    furosemide 20 MG tablet Commonly known as: LASIX Stopped by: Yvonna Alanis, NP       TAKE these medications     acetaminophen 325 MG tablet Commonly known as: TYLENOL Take 1-2 tablets (325-650 mg total) by mouth every 6 (six) hours as needed for mild pain (pain score 1-3 or temp > 100.5).   albuterol 108 (90 Base) MCG/ACT inhaler Commonly known as: VENTOLIN HFA Inhale 2 puffs into the lungs every 4 (four) hours as needed for wheezing or shortness of breath.   apixaban 5 MG Tabs tablet Commonly known as: ELIQUIS Take 1 tablet (5 mg total) by mouth 2 (two) times daily.   CALCIUM-VITAMIN D3 PO Take 600 mg by mouth in the morning.   carbidopa-levodopa 25-100 MG tablet Commonly known as: SINEMET IR Take 1 tablet by mouth 4 (four) times daily.   carbidopa-levodopa 50-200 MG tablet Commonly known as: SINEMET CR Take 1 tablet by mouth at bedtime.   feeding supplement (PRO-STAT SUGAR FREE 64) Liqd Take 30 mLs by mouth in the morning and at bedtime.   heparin flush 10 UNIT/ML Soln injection Inject 10 Units into the vein 2 (two) times daily. Flush with 5 mL of Heparin 10u/mL after Antibiotic administration and saline flush of 10 mL.   polyethylene glycol 17 g packet Commonly known as: MiraLax Take 17 g by mouth daily.   potassium chloride SA 20 MEQ tablet Commonly known as: KLOR-CON Take 20 mEq by mouth 2 (two) times daily.   senna 8.6 MG Tabs tablet Commonly known as: SENOKOT Take 1 tablet (8.6 mg total) by mouth daily.   sodium chloride irrigation 0.9 % irrigation Irrigate with as directed 2 (two) times daily. Flush before Antibiotic PICC Administration with 10cc of NS   torsemide 20 MG tablet Commonly known as: DEMADEX Take 20 mg by mouth daily. Give demadex 2 tablets (27m) PO daily   vancomycin 750 mg in sodium chloride 0.9 % 250 mL Inject 750 mg into the vein every 12 (twelve) hours.   Vitamin D (Cholecalciferol) 25 MCG (1000 UT) Tabs Take 25 mcg by mouth daily.   zinc oxide 20 % ointment Apply 1 application topically as needed for irritation.        Review of  Systems  Constitutional:  Negative for activity change, appetite change, chills, fatigue and fever.  HENT:  Negative for congestion and trouble swallowing.   Eyes:  Negative for visual disturbance.  Respiratory:  Positive for cough, shortness of breath and wheezing.   Cardiovascular:  Positive for leg swelling. Negative for chest pain.  Gastrointestinal:  Negative for abdominal distention, abdominal pain, blood in stool, constipation, diarrhea, nausea and vomiting.  Genitourinary:  Positive for penile swelling. Negative for dysuria, frequency and hematuria.  Musculoskeletal:  Positive for arthralgias and gait problem.  Skin:  Positive for wound.  Neurological:  Positive for tremors and weakness. Negative for dizziness and headaches.  Psychiatric/Behavioral:  Negative for dysphoric mood and sleep disturbance. The patient is not nervous/anxious.    Immunization History  Administered Date(s) Administered   H1N1 06/13/2008   Influenza Split 03/05/2006, 02/26/2007, 01/26/2008,  01/18/2009, 12/12/2009, 01/16/2011, 01/29/2012, 02/01/2013, 02/08/2014, 02/13/2015, 01/10/2016, 02/23/2017, 02/24/2018, 01/24/2019, 12/22/2019   PFIZER(Purple Top)SARS-COV-2 Vaccination 05/04/2019, 05/25/2019, 09/01/2020   Pfizer Covid-19 Vaccine Bivalent Booster 55yr & up 12/27/2019, 12/26/2020   Pneumococcal Conjugate-13 02/10/2014   Pneumococcal Polysaccharide-23 06/10/2011   Zoster, Live 07/04/2009   Pertinent  Health Maintenance Due  Topic Date Due   INFLUENZA VACCINE  11/05/2020   Fall Risk 10/11/2020 10/11/2020 10/11/2020 10/12/2020 01/10/2021  Falls in the past year? - - - - 1  Was there an injury with Fall? - - - - 1  Fall Risk Category Calculator - - - - 2  Fall Risk Category - - - - Moderate  Patient Fall Risk Level High fall risk High fall risk High fall risk High fall risk High fall risk   Functional Status Survey:    Vitals:   02/08/21 0830  BP: 123/69  Pulse: 84  Resp: 19  Temp: 97.9 F (36.6 C)   SpO2: 90%  Weight: 204 lb 1.6 oz (92.6 kg)  Height: '5\' 9"'  (1.753 m)   Body mass index is 30.14 kg/m. Physical Exam Vitals reviewed.  Constitutional:      General: He is not in acute distress. HENT:     Head: Normocephalic.  Eyes:     General:        Right eye: No discharge.        Left eye: No discharge.  Neck:     Vascular: No carotid bruit.  Cardiovascular:     Rate and Rhythm: Normal rate and regular rhythm.     Pulses: Normal pulses.     Heart sounds: Murmur heard.  Pulmonary:     Effort: Pulmonary effort is normal.     Breath sounds: Examination of the right-middle field reveals rales. Examination of the right-lower field reveals decreased breath sounds. Examination of the left-lower field reveals decreased breath sounds. Decreased breath sounds and rales present. No wheezing.  Abdominal:     General: Bowel sounds are normal. There is no distension.     Palpations: Abdomen is soft.     Tenderness: There is no abdominal tenderness.  Genitourinary:    Penis: Swelling present.   Musculoskeletal:     Cervical back: Normal range of motion.     Right lower leg: Edema present.     Left lower leg: Edema present.     Comments: L>R 2-3+ pitting edema  Lymphadenopathy:     Cervical: No cervical adenopathy.  Skin:    General: Skin is warm and dry.     Capillary Refill: Capillary refill takes less than 2 seconds.     Comments: UTA left heel due to dressing, no odor present  Neurological:     General: No focal deficit present.     Mental Status: He is alert and oriented to person, place, and time.     Motor: Weakness present.     Gait: Gait abnormal.     Comments: wheelchair  Psychiatric:        Mood and Affect: Mood normal.        Behavior: Behavior normal.    Labs reviewed: Recent Labs    10/09/20 0546 10/09/20 0937 10/10/20 0607 10/11/20 0037 10/12/20 0146 10/25/20 0000 01/08/21 0000 01/12/21 0000 01/15/21 0000  NA  --    < > 131* 129* 132*   < > 136* 135*  137  K  --    < > 4.2 4.5 4.1   < > 4.8 5.0 4.4  CL  --    < > 97* 96* 97*   < > 104 103 104  CO2  --    < > '28 29 28   ' < > 24* 27* 23*  GLUCOSE  --    < > 134* 114* 122*  --   --   --   --   BUN  --    < > 23 26* 24*   < > 35* 29* 24*  CREATININE  --    < > 1.20 1.14 1.14   < > 1.4* 1.3 1.3  CALCIUM  --    < > 8.7* 8.6* 8.4*   < > 8.6* 8.5* 8.5*  MG  --   --  2.1  --   --   --   --   --   --   PHOS 3.4  --   --   --  3.5  --   --   --   --    < > = values in this interval not displayed.   Recent Labs    10/09/20 0937 10/11/20 0037 10/12/20 0146 12/26/20 0000 12/27/20 0000  AST 22 34  --  19 18  ALT 7 <5  --  9* 6*  ALKPHOS 31* 24*  --  55 46  BILITOT 1.7* 1.1  --   --   --   PROT 5.9* 4.6*  --   --   --   ALBUMIN 3.4* 2.6* 2.6* 3.7 2.9*   Recent Labs    10/10/20 0607 10/11/20 0037 10/12/20 0146 10/12/20 0146 10/25/20 0000 11/19/20 0000 12/27/20 0000 01/15/21 0000  WBC 13.8* 10.1 8.5   < > 7.1 6.2 7.8 7.0  NEUTROABS  --   --  6.4  --  5,013.00  --  5,694.00 4,515.00  HGB 8.7* 7.4* 7.8*  --  8.9* 9.9* 10.1* 10.3*  HCT 25.3* 21.8* 22.8*  --  27* 31* 31* 33*  MCV 96.2 96.9 96.6  --   --   --   --   --   PLT 93* 90* 116*  --  293 206 283 267   < > = values in this interval not displayed.   No results found for: TSH Lab Results  Component Value Date   HGBA1C 5.9 (H) 10/08/2020   No results found for: CHOL, HDL, LDLCALC, LDLDIRECT, TRIG, CHOLHDL  Significant Diagnostic Results in last 30 days:  VAS [place] ABI WITH/WO TBI  Result Date: 01/21/2021  LOWER EXTREMITY DOPPLER STUDY Patient Name:  AASIM RESTIVO  Date of Exam:   01/21/2021 Medical Rec #: [id number]      Accession #:    [id number] Date of Birth: 13-Apr-1931      Patient Gender: M Patient Age:   67 years Exam Location:  [facility] Procedure:      VAS [place] ABI WITH/WO TBI Referring Phys: MICHAEL ROBSON --------------------------------------------------------------------------------  High Risk Factors:  Hypertension, hyperlipidemia. Other Factors: Non healing left heel wound.  Performing Technologist: Delorise Shiner RVT  Examination Guidelines: A complete evaluation includes at minimum, Doppler waveform signals and systolic blood pressure reading at the level of bilateral brachial, anterior tibial, and posterior tibial arteries, when vessel segments are accessible. Bilateral testing is considered an integral part of a complete examination. Photoelectric Plethysmograph (PPG) waveforms and toe systolic pressure readings are included as required and additional duplex testing as needed. Limited examinations for reoccurring indications may be performed as noted.  ABI Findings: +---------+------------------+-----+---------+--------------------+ Right  Rt Pressure (mmHg)IndexWaveform Comment              +---------+------------------+-----+---------+--------------------+ Brachial                                 Indwelling PICC line +---------+------------------+-----+---------+--------------------+ PTA      151               1.08 biphasic                      +---------+------------------+-----+---------+--------------------+ DP       158               1.13 triphasic                     +---------+------------------+-----+---------+--------------------+ Great Toe75                0.54                               +---------+------------------+-----+---------+--------------------+ +---------+------------------+-----+----------+-------+ Left     Lt Pressure (mmHg)IndexWaveform  Comment +---------+------------------+-----+----------+-------+ Brachial 140                                      +---------+------------------+-----+----------+-------+ ATA      255                    monophasic        +---------+------------------+-----+----------+-------+ PTA      255               1.82 biphasic          +---------+------------------+-----+----------+-------+ Great Toe52                 0.37                   +---------+------------------+-----+----------+-------+ +-------+----------------+-----------+------------+------------+ ABI/TBIToday's ABI     Today's TBIPrevious ABIPrevious TBI +-------+----------------+-----------+------------+------------+ Right  1.13            0.54                                +-------+----------------+-----------+------------+------------+ Left   Non compressible0.37                                +-------+----------------+-----------+------------+------------+  Summary: Right: Resting right ankle-brachial index is within normal range. No evidence of significant right lower extremity arterial disease. The right toe-brachial index is abnormal. Left: Resting left ankle-brachial index indicates noncompressible left lower extremity arteries. The left toe-brachial index is abnormal.  *See table(s) above for measurements and observations.  Electronically signed by Orlie Pollen on 01/21/2021 at 7:06:40 PM.    Final    VAS [place] LOWER EXTREMITY ARTERIAL DUPLEX  Result Date: 01/21/2021 LOWER EXTREMITY ARTERIAL DUPLEX STUDY Patient Name:  DEANDREW HOECKER  Date of Exam:   01/21/2021 Medical Rec #: [id number]      Accession #:    [id number] Date of Birth: 26-Mar-1932      Patient Gender: M Patient Age:   73 years Exam Location:  [facility] Procedure:      VAS [place] LOWER EXTREMITY ARTERIAL DUPLEX Referring Phys: Legrand Como ROBSON --------------------------------------------------------------------------------  Indications: Ulceration, and peripheral artery disease. High Risk Factors: Hypertension. Other Factors: Non healing wound left heel. MRSA.  Current ABI: Right: 1.13, Left: non compressible Performing Technologist: Delorise Shiner RVT  Examination Guidelines: A complete evaluation includes B-mode imaging, spectral Doppler, color Doppler, and power Doppler as needed of all accessible portions of each vessel. Bilateral testing is  considered an integral part of a complete examination. Limited examinations for reoccurring indications may be performed as noted.   +----------+--------+-----+--------+----------+--------+ LEFT      PSV cm/sRatioStenosisWaveform  Comments +----------+--------+-----+--------+----------+--------+ CFA Distal143                  triphasic          +----------+--------+-----+--------+----------+--------+ DFA       76                   triphasic          +----------+--------+-----+--------+----------+--------+ SFA Prox  88                   triphasic          +----------+--------+-----+--------+----------+--------+ SFA Mid   90                   monophasic         +----------+--------+-----+--------+----------+--------+ SFA Distal119                  triphasic          +----------+--------+-----+--------+----------+--------+ POP Prox  53                   triphasic          +----------+--------+-----+--------+----------+--------+ POP Distal90                   triphasic          +----------+--------+-----+--------+----------+--------+ ATA Distal61                   monophasic         +----------+--------+-----+--------+----------+--------+ PTA Distal92                   monophasic         +----------+--------+-----+--------+----------+--------+ No focal hemodynamically significant stenosis identified. Waveform analysis is consistent with occlusive disease in the proximal calf tibial outflow arteries.  Summary: Left: Findings are consistent with tibial outflow disease. No other hemodynamically significant stenosis is identified.  See table(s) above for measurements and observations. Electronically signed by Orlie Pollen on 01/21/2021 at 7:06:29 PM.    Final    IR PICC PLACEMENT RIGHT >5 YRS INC IMG GUIDE  Result Date: 01/14/2021 INDICATION: Calcaneal osteomyelitis, in need of long-term IV antibiotic treatment. Request for PICC line placement.  EXAM: ULTRASOUND AND FLUOROSCOPIC GUIDED PICC LINE INSERTION MEDICATIONS: 1% lidocaine CONTRAST:  None FLUOROSCOPY TIME:  24 seconds (1 mGy) COMPLICATIONS: None immediate. TECHNIQUE: The procedure, risks, benefits, and alternatives were explained to the patient and informed written consent was obtained. The right upper extremity was prepped with chlorhexidine in a sterile fashion, and a sterile drape was applied covering the operative field. Maximum barrier sterile technique with sterile gowns and gloves were used for the procedure. A timeout was performed prior to the initiation of the procedure. Local anesthesia was provided with 1% lidocaine. After the overlying soft tissues were anesthetized with 1% lidocaine, a micropuncture kit was utilized to access the right basilic vein. Real-time ultrasound guidance was utilized for vascular access including the acquisition of a  permanent ultrasound image documenting patency of the accessed vessel. A guidewire was advanced to the level of the superior caval-atrial junction for measurement purposes and the PICC line was cut to length. A peel-away sheath was placed and a 43 cm, 5 [place], single lumen was inserted to level of the superior caval-atrial junction. A post procedure spot fluoroscopic was obtained. The catheter easily aspirated and flushed and was secured in place. A dressing was placed. The patient tolerated the procedure well without immediate post procedural complication. FINDINGS: After catheter placement, the tip lies within the superior cavoatrial junction. The catheter aspirates and flushes normally and is ready for immediate use. IMPRESSION: Successful ultrasound and fluoroscopic guided placement of a right basilic vein approach, 43 cm, 5 [place], single lumen PICC with tip at the superior caval-atrial junction. The PICC line is ready for immediate use. Read by: Durenda Guthrie, PA-C Electronically Signed   By: Sandi Mariscal M.D.   On: 01/14/2021 14:57     Assessment/Plan 1. Acute on chronic congestive heart failure, unspecified heart failure type ([place]) - weight gain of 14 lbs since 01/25/2021 - lungs diminished, rales mid right lobe, 2-3+ edema, penile edema - unsuccessful trial of increased lasix - will start torsemide 40 mg po daily - cont potassium 20 meq po bid - schedule Echo- scheduled 02/20/2021 at [place] - extend daily weights for 2 more weeks - report weight gain >3lbs/day to provider  2. Community acquired pneumonia of left lower lobe of lung - CXR 01/31/2021- PNA versus CHF? - lung bases diminished today - cough productive- brown thick sputum - start doxycycline 100 mg po bid x 7 days  3. Acute osteomyelitis of left calcaneus ([facility]) - followed by Dr. Dellia Nims and ID - not interested in vascular surgery consult - cont PICC line care - cont IV vanc  4. DVT of deep femoral vein, right ([place]) - diagnosed 12/2020 - cont Eliquis  5. Peripheral edema - see above  6. Parkinson's disease ([place]) - cont sinemet  7. Primary hypertension - controlled without meds  8. Blood loss anemia - hgb 9.3 - FOBT x 3 completed- 1 test positive  9. Primary osteoarthritis involving multiple joints - cont tylenol for pain  10. Age-related osteoporosis without current pathological fracture - cont calcium and vitamin d supplement  11. Slow transit constipation - LBM 11/03 - abdomen soft -cont miralax and senna    Family/ staff Communication: plan discussed with patient and nurse  Labs/tests ordered:  cont routine labs per ID

## 2021-02-11 ENCOUNTER — Encounter: Payer: Self-pay | Admitting: Orthopedic Surgery

## 2021-02-11 ENCOUNTER — Non-Acute Institutional Stay (SKILLED_NURSING_FACILITY): Payer: Medicare Other | Admitting: Orthopedic Surgery

## 2021-02-11 DIAGNOSIS — K5901 Slow transit constipation: Secondary | ICD-10-CM

## 2021-02-11 DIAGNOSIS — G20 Parkinson's disease: Secondary | ICD-10-CM | POA: Diagnosis not present

## 2021-02-11 DIAGNOSIS — I509 Heart failure, unspecified: Secondary | ICD-10-CM | POA: Diagnosis not present

## 2021-02-11 DIAGNOSIS — M86172 Other acute osteomyelitis, left ankle and foot: Secondary | ICD-10-CM

## 2021-02-11 DIAGNOSIS — I82411 Acute embolism and thrombosis of right femoral vein: Secondary | ICD-10-CM | POA: Diagnosis not present

## 2021-02-11 DIAGNOSIS — J189 Pneumonia, unspecified organism: Secondary | ICD-10-CM

## 2021-02-11 DIAGNOSIS — M810 Age-related osteoporosis without current pathological fracture: Secondary | ICD-10-CM

## 2021-02-11 DIAGNOSIS — B999 Unspecified infectious disease: Secondary | ICD-10-CM

## 2021-02-11 DIAGNOSIS — I10 Essential (primary) hypertension: Secondary | ICD-10-CM

## 2021-02-11 DIAGNOSIS — D638 Anemia in other chronic diseases classified elsewhere: Secondary | ICD-10-CM

## 2021-02-11 MED ORDER — TORSEMIDE 20 MG PO TABS
20.0000 mg | ORAL_TABLET | Freq: Every day | ORAL | 0 refills | Status: DC
Start: 2021-02-11 — End: 2021-04-05

## 2021-02-11 NOTE — Progress Notes (Signed)
Location:   [facility] Room Number: 27-A Place of Service:  SNF [phone]) Provider:  Windell Moulding, NP    Patient Care Team: Virgie Dad, MD as PCP - General (Internal Medicine) [name], Eustace Quail, DO as Consulting Physician (Neurology)  Extended Emergency Contact Information Primary Emergency Contact: Vergia Alcon Address: [place].          apt [phone] Johnnette Litter of [place] Phone: [phone] Mobile Phone: [phone] Relation: Spouse Secondary Emergency Contact: kelby, adell Mobile Phone: [phone] Relation: Son  Code Status:  FULL CODE Goals of care: Advanced Directive information Advanced Directives 02/11/2021  Does Patient Have a Medical Advance Directive? Yes  Type of Advance Directive Living will  Does patient want to make changes to medical advance directive? No - Patient declined  Would patient like information on creating a medical advance directive? -     Chief Complaint  Patient presents with   Acute Visit    Lower Leg Edema    HPI:  Pt is a 87 y.o. male seen today for an acute visit for unresolved lower leg edema.   Wife present during encounter.   Pt is a 87 y.o. male seen today for an acute visit for unresolved bilateral leg swelling and cough. Last week, CXR revealed bilateral interstitial pulmonary infiltrate and small bilateral pulmonary effusions, left greater than right, suggestive of pneumonia versus CHF. BNP 282 01/31/2021. 11/04 he was switched to torsemide 40 mg daily. Torsemide started on 11/05. Penile edema unchanged. Reports cough has reduced, continues to spit up tan sputum. Reports feeling less short of breath. Also having some wheezing, resolved with Albuterol prn. Ankles continue to be swollen. Echocardiogram scheduled for 02/20/2021.   Left heel wound- diagnosed with left calcaneous osteomyelitis, wound culture indicated MRSA, followed by wound care center and ID, PICC line with Vanc,  dressing changes Santyl and silver alginate daily, ABI completed no stenosis- consistent with tibial outflow disease, 11/01 seen by Dr. Dellia Nims reports less necrotic tissue/ bone is exposed, remains extensive necrotic wound, patient refused vascular surgery consult DVT of deep femoral vein- diagnosed 12/2020, remains on Eliquis Parkinson's- followed by neurology, remains on sinemet HTN- BUN/creat 32/1.18 02/11/2021, off carvedilol at this time Anemia- hgb 9.5 02/11/2021, no apparent blood loss at this time, FOBT x3- one positive result  Osteoporosis- ORIF right hip 10/10/2020, ambulating with walker, stopped fosamax 2018 (6 years use), remains on calcium and vitamin D supplements Constipation- LBM 11/03, remains on miralax and senna  Recent weights:  11/07- 203.1 lbs  11/06- 205.4 lbs  11/05- 206 lbs  11/04- 207.5 lbs  10/21- 193.8 lbs  10/14- 188 lbs    Past Medical History:  Diagnosis Date   Acute bronchiolitis    Acute osteomyelitis of calcaneum, left ([place]) 01/11/2021   Anemia    Bursitis of both hips    Hearing reduced    hearing aid   History of basal cell carcinoma    Hyperlipidemia    Malaise and fatigue    MRSA infection 01/11/2021   Osteoarthritis    hand   Osteoporosis    Palpitation    Vitamin D deficiency    Past Surgical History:  Procedure Laterality Date   CATARACT EXTRACTION  05/31/2015   Eye/right   COLONOSCOPY  02/05/2005, 03/06/2005   normal, 10 years ago   MOHS micrographic surgery     Face surgery   ORIF ACETABULAR FRACTURE Right 10/09/2020   Procedure: OPEN REDUCTION  INTERNAL FIXATION (ORIF) ACETABULAR FRACTURE;  Surgeon: Altamese , MD;  Location: [facility];  Service: Orthopedics;  Laterality: Right;    No Known Allergies  Allergies as of 02/11/2021   No Known Allergies      Medication List        Accurate as of February 11, 2021  2:58 PM. If you have any questions, ask your nurse or doctor.          acetaminophen 325 MG tablet Commonly  known as: TYLENOL Take 1-2 tablets (325-650 mg total) by mouth every 6 (six) hours as needed for mild pain (pain score 1-3 or temp > 100.5).   albuterol 108 (90 Base) MCG/ACT inhaler Commonly known as: VENTOLIN HFA Inhale 2 puffs into the lungs every 4 (four) hours as needed for wheezing or shortness of breath.   apixaban 5 MG Tabs tablet Commonly known as: ELIQUIS Take 1 tablet (5 mg total) by mouth 2 (two) times daily.   CALCIUM-VITAMIN D3 PO Take 600 mg by mouth in the morning.   carbidopa-levodopa 25-100 MG tablet Commonly known as: SINEMET IR Take 1 tablet by mouth 4 (four) times daily.   carbidopa-levodopa 50-200 MG tablet Commonly known as: SINEMET CR Take 1 tablet by mouth at bedtime.   doxycycline 100 MG capsule Commonly known as: VIBRAMYCIN Take 100 mg by mouth 2 (two) times daily.   feeding supplement (PRO-STAT SUGAR FREE 64) Liqd Take 30 mLs by mouth in the morning and at bedtime.   heparin flush 10 UNIT/ML Soln injection Inject 10 Units into the vein 2 (two) times daily. Flush with 5 mL of Heparin 10u/mL after Antibiotic administration and saline flush of 10 mL.   polyethylene glycol 17 g packet Commonly known as: MiraLax Take 17 g by mouth daily.   potassium chloride SA 20 MEQ tablet Commonly known as: KLOR-CON Take 20 mEq by mouth 2 (two) times daily.   senna 8.6 MG Tabs tablet Commonly known as: SENOKOT Take 1 tablet (8.6 mg total) by mouth daily.   sodium chloride irrigation 0.9 % irrigation Irrigate with as directed 2 (two) times daily. Flush before Antibiotic PICC Administration with 10cc of NS   torsemide 20 MG tablet Commonly known as: DEMADEX Take 20 mg by mouth daily. Give demadex 2 tablets (69m) PO daily   vancomycin 750 mg in sodium chloride 0.9 % 250 mL Inject 750 mg into the vein every 12 (twelve) hours.   Vitamin D (Cholecalciferol) 25 MCG (1000 UT) Tabs Take 25 mcg by mouth daily.   zinc oxide 20 % ointment Apply 1 application  topically as needed for irritation.        Review of Systems  Constitutional:  Negative for activity change, appetite change, chills, fatigue and fever.  HENT:  Negative for congestion and trouble swallowing.   Eyes:  Negative for visual disturbance.  Respiratory:  Positive for cough and wheezing. Negative for shortness of breath.   Cardiovascular:  Positive for leg swelling. Negative for chest pain.  Gastrointestinal:  Negative for abdominal distention, abdominal pain, blood in stool, constipation, diarrhea, nausea and vomiting.  Genitourinary:  Positive for penile swelling. Negative for dysuria, frequency and hematuria.  Musculoskeletal:  Positive for arthralgias and gait problem.  Skin:        Left heel osteomyelitis  Neurological:  Positive for weakness. Negative for dizziness and headaches.  Psychiatric/Behavioral:  Negative for confusion and dysphoric mood. The patient is not nervous/anxious.    Immunization History  Administered Date(s) Administered  H1N1 06/13/2008   Influenza Split 03/05/2006, 02/26/2007, 01/26/2008, 01/18/2009, 12/12/2009, 01/16/2011, 01/29/2012, 02/01/2013, 02/08/2014, 02/13/2015, 01/10/2016, 02/23/2017, 02/24/2018, 01/24/2019, 12/22/2019   PFIZER(Purple Top)SARS-COV-2 Vaccination 05/04/2019, 05/25/2019, 09/01/2020   Pfizer Covid-19 Vaccine Bivalent Booster 75yr & up 12/27/2019, 12/26/2020   Pneumococcal Conjugate-13 02/10/2014   Pneumococcal Polysaccharide-23 06/10/2011   Zoster, Live 07/04/2009   Pertinent  Health Maintenance Due  Topic Date Due   INFLUENZA VACCINE  11/05/2020   Fall Risk 10/11/2020 10/11/2020 10/11/2020 10/12/2020 01/10/2021  Falls in the past year? - - - - 1  Was there an injury with Fall? - - - - 1  Fall Risk Category Calculator - - - - 2  Fall Risk Category - - - - Moderate  Patient Fall Risk Level High fall risk High fall risk High fall risk High fall risk High fall risk   Functional Status Survey:    Vitals:   02/11/21 1452   BP: (!) 115/55  Pulse: 86  Resp: 17  Temp: 97.9 F (36.6 C)  SpO2: 93%  Weight: 205 lb 6.4 oz (93.2 kg)  Height: '5\' 9"'  (1.753 m)   Body mass index is 30.33 kg/m. Physical Exam Vitals reviewed.  Constitutional:      General: He is not in acute distress. HENT:     Head: Normocephalic.  Eyes:     General:        Right eye: No discharge.        Left eye: No discharge.  Neck:     Vascular: No carotid bruit.  Cardiovascular:     Rate and Rhythm: Normal rate and regular rhythm.     Pulses: Normal pulses.     Heart sounds: Normal heart sounds. No murmur heard. Pulmonary:     Effort: Pulmonary effort is normal. No respiratory distress.     Breath sounds: No stridor. Rales present. No wheezing or rhonchi.     Comments: RLL rales present Musculoskeletal:     Cervical back: Normal range of motion.     Right lower leg: Edema present.     Left lower leg: Edema present.     Comments: 2-3+ pitting edema  Lymphadenopathy:     Cervical: No cervical adenopathy.  Skin:    General: Skin is warm and dry.     Comments: Left heel UTA due to dressing, no odor present.   Neurological:     General: No focal deficit present.     Mental Status: He is alert. Mental status is at baseline.     Motor: Weakness present.     Gait: Gait abnormal.     Comments: wheelchair  Psychiatric:        Mood and Affect: Mood normal.        Behavior: Behavior normal.    Labs reviewed: Recent Labs    10/09/20 0546 10/09/20 0937 10/10/20 0607 10/11/20 0037 10/12/20 0146 10/25/20 0000 01/12/21 0000 01/15/21 0000 02/07/21 0000  NA  --    < > 131* 129* 132*   < > 135* 137 134*  K  --    < > 4.2 4.5 4.1   < > 5.0 4.4 4.5  CL  --    < > 97* 96* 97*   < > 103 104 100  CO2  --    < > '28 29 28   ' < > 27* 23* 27*  GLUCOSE  --    < > 134* 114* 122*  --   --   --   --  BUN  --    < > 23 26* 24*   < > 29* 24* 26*  CREATININE  --    < > 1.20 1.14 1.14   < > 1.3 1.3 1.0  CALCIUM  --    < > 8.7* 8.6* 8.4*   <  > 8.5* 8.5* 8.3*  MG  --   --  2.1  --   --   --   --   --   --   PHOS 3.4  --   --   --  3.5  --   --   --   --    < > = values in this interval not displayed.   Recent Labs    10/09/20 0937 10/11/20 0037 10/12/20 0146 12/26/20 0000 12/27/20 0000  AST 22 34  --  19 18  ALT 7 <5  --  9* 6*  ALKPHOS 31* 24*  --  55 46  BILITOT 1.7* 1.1  --   --   --   PROT 5.9* 4.6*  --   --   --   ALBUMIN 3.4* 2.6* 2.6* 3.7 2.9*   Recent Labs    10/10/20 0607 10/11/20 0037 10/12/20 0146 10/12/20 0146 10/25/20 0000 11/19/20 0000 12/27/20 0000 01/15/21 0000  WBC 13.8* 10.1 8.5   < > 7.1 6.2 7.8 7.0  NEUTROABS  --   --  6.4  --  5,013.00  --  5,694.00 4,515.00  HGB 8.7* 7.4* 7.8*  --  8.9* 9.9* 10.1* 10.3*  HCT 25.3* 21.8* 22.8*  --  27* 31* 31* 33*  MCV 96.2 96.9 96.6  --   --   --   --   --   PLT 93* 90* 116*  --  293 206 283 267   < > = values in this interval not displayed.   No results found for: TSH Lab Results  Component Value Date   HGBA1C 5.9 (H) 10/08/2020   No results found for: CHOL, HDL, LDLCALC, LDLDIRECT, TRIG, CHOLHDL  Significant Diagnostic Results in last 30 days:  VAS [place] ABI WITH/WO TBI  Result Date: 01/21/2021  LOWER EXTREMITY DOPPLER STUDY Patient Name:  LORD LANCOUR  Date of Exam:   01/21/2021 Medical Rec #: [id number]      Accession #:    [id number] Date of Birth: [birth year]      Patient Gender: M Patient Age:   59 years Exam Location:  [facility] Procedure:      VAS [place] ABI WITH/WO TBI Referring Phys: MICHAEL ROBSON --------------------------------------------------------------------------------  High Risk Factors: Hypertension, hyperlipidemia. Other Factors: Non healing left heel wound.  Performing Technologist: Delorise Shiner RVT  Examination Guidelines: A complete evaluation includes at minimum, Doppler waveform signals and systolic blood pressure reading at the level of bilateral brachial, anterior tibial, and posterior tibial arteries, when  vessel segments are accessible. Bilateral testing is considered an integral part of a complete examination. Photoelectric Plethysmograph (PPG) waveforms and toe systolic pressure readings are included as required and additional duplex testing as needed. Limited examinations for reoccurring indications may be performed as noted.  ABI Findings: +---------+------------------+-----+---------+--------------------+ Right    Rt Pressure (mmHg)IndexWaveform Comment              +---------+------------------+-----+---------+--------------------+ Brachial                                 Indwelling PICC line +---------+------------------+-----+---------+--------------------+ PTA  151               1.08 biphasic                      +---------+------------------+-----+---------+--------------------+ DP       158               1.13 triphasic                     +---------+------------------+-----+---------+--------------------+ Great Toe75                0.54                               +---------+------------------+-----+---------+--------------------+ +---------+------------------+-----+----------+-------+ Left     Lt Pressure (mmHg)IndexWaveform  Comment +---------+------------------+-----+----------+-------+ Brachial 140                                      +---------+------------------+-----+----------+-------+ ATA      255                    monophasic        +---------+------------------+-----+----------+-------+ PTA      255               1.82 biphasic          +---------+------------------+-----+----------+-------+ Great Toe52                0.37                   +---------+------------------+-----+----------+-------+ +-------+----------------+-----------+------------+------------+ ABI/TBIToday's ABI     Today's TBIPrevious ABIPrevious TBI +-------+----------------+-----------+------------+------------+ Right  1.13            0.54                                 +-------+----------------+-----------+------------+------------+ Left   Non compressible0.37                                +-------+----------------+-----------+------------+------------+  Summary: Right: Resting right ankle-brachial index is within normal range. No evidence of significant right lower extremity arterial disease. The right toe-brachial index is abnormal. Left: Resting left ankle-brachial index indicates noncompressible left lower extremity arteries. The left toe-brachial index is abnormal.  *See table(s) above for measurements and observations.  Electronically signed by Orlie Pollen on 01/21/2021 at 7:06:40 PM.    Final    VAS [place] LOWER EXTREMITY ARTERIAL DUPLEX  Result Date: 01/21/2021 LOWER EXTREMITY ARTERIAL DUPLEX STUDY Patient Name:  RAJOHN HENERY  Date of Exam:   01/21/2021 Medical Rec #: [id number]      Accession #:    [id number] Date of Birth: May 23, 1931      Patient Gender: M Patient Age:   20 years Exam Location:  [facility] Procedure:      VAS [place] LOWER EXTREMITY ARTERIAL DUPLEX Referring Phys: Legrand Como ROBSON --------------------------------------------------------------------------------  Indications: Ulceration, and peripheral artery disease. High Risk Factors: Hypertension. Other Factors: Non healing wound left heel. MRSA.  Current ABI: Right: 1.13, Left: non compressible Performing Technologist: Delorise Shiner RVT  Examination Guidelines: A complete evaluation includes B-mode imaging, spectral Doppler, color Doppler, and power Doppler as needed of all accessible portions of each vessel. Bilateral testing is  considered an integral part of a complete examination. Limited examinations for reoccurring indications may be performed as noted.   +----------+--------+-----+--------+----------+--------+ LEFT      PSV cm/sRatioStenosisWaveform  Comments +----------+--------+-----+--------+----------+--------+ CFA Distal143                   triphasic          +----------+--------+-----+--------+----------+--------+ DFA       76                   triphasic          +----------+--------+-----+--------+----------+--------+ SFA Prox  88                   triphasic          +----------+--------+-----+--------+----------+--------+ SFA Mid   90                   monophasic         +----------+--------+-----+--------+----------+--------+ SFA Distal119                  triphasic          +----------+--------+-----+--------+----------+--------+ POP Prox  53                   triphasic          +----------+--------+-----+--------+----------+--------+ POP Distal90                   triphasic          +----------+--------+-----+--------+----------+--------+ ATA Distal61                   monophasic         +----------+--------+-----+--------+----------+--------+ PTA Distal92                   monophasic         +----------+--------+-----+--------+----------+--------+ No focal hemodynamically significant stenosis identified. Waveform analysis is consistent with occlusive disease in the proximal calf tibial outflow arteries.  Summary: Left: Findings are consistent with tibial outflow disease. No other hemodynamically significant stenosis is identified.  See table(s) above for measurements and observations. Electronically signed by Orlie Pollen on 01/21/2021 at 7:06:29 PM.    Final    IR PICC PLACEMENT RIGHT >5 YRS INC IMG GUIDE  Result Date: 01/14/2021 INDICATION: Calcaneal osteomyelitis, in need of long-term IV antibiotic treatment. Request for PICC line placement. EXAM: ULTRASOUND AND FLUOROSCOPIC GUIDED PICC LINE INSERTION MEDICATIONS: 1% lidocaine CONTRAST:  None FLUOROSCOPY TIME:  24 seconds (1 mGy) COMPLICATIONS: None immediate. TECHNIQUE: The procedure, risks, benefits, and alternatives were explained to the patient and informed written consent was obtained. The right upper extremity was prepped  with chlorhexidine in a sterile fashion, and a sterile drape was applied covering the operative field. Maximum barrier sterile technique with sterile gowns and gloves were used for the procedure. A timeout was performed prior to the initiation of the procedure. Local anesthesia was provided with 1% lidocaine. After the overlying soft tissues were anesthetized with 1% lidocaine, a micropuncture kit was utilized to access the right basilic vein. Real-time ultrasound guidance was utilized for vascular access including the acquisition of a permanent ultrasound image documenting patency of the accessed vessel. A guidewire was advanced to the level of the superior caval-atrial junction for measurement purposes and the PICC line was cut to length. A peel-away sheath was placed and a 43 cm, 5 [place], single lumen was inserted to level of the superior caval-atrial junction. A post procedure spot fluoroscopic  was obtained. The catheter easily aspirated and flushed and was secured in place. A dressing was placed. The patient tolerated the procedure well without immediate post procedural complication. FINDINGS: After catheter placement, the tip lies within the superior cavoatrial junction. The catheter aspirates and flushes normally and is ready for immediate use. IMPRESSION: Successful ultrasound and fluoroscopic guided placement of a right basilic vein approach, 43 cm, 5 [place], single lumen PICC with tip at the superior caval-atrial junction. The PICC line is ready for immediate use. Read by: Durenda Guthrie, PA-C Electronically Signed   By: Sandi Mariscal M.D.   On: 01/14/2021 14:57    Assessment/Plan 1. Acute congestive heart failure, unspecified heart failure type ([place]) - weight gain 14 lbs from 10/21- 11/05 - lung sounds improved- rales to right lower lobe, 2-3+ pitting ankle edema, coughing less, penile edema unchanged - cont torsemide 40 mg daily - add torsemide 20 mg po daily x 2 days- give at 1600 - echo scheduled  02/20/2021  2. Community acquired pneumonia of left lower lobe - started on Doxycycline 11/05 x 7 days - reports improved breathing - continues to cough up tan sputum  2. Acute osteomyelitis of left calcaneus ([place]) - followed by Dr. Dellia Nims and ID - vancomycin trough 20.7 02/11/2021 - Sed rate 53 02/11/2021 - not interested in vascular surgery consult - cont PICC line care - cont IV vanc  3. DVT of deep femoral vein, right ([place]) - diagnosed 12/2020 - cont Eliquis  4. Parkinson's disease ([place]) - cont sinemet - cont skilled nursing care  5. Primary hypertension - controlled without meds  6. 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 Age-related osteoporosis without current pathological fracture - cont calcium and vitamin D  8. Slow transit constipation - LBM 11/07 - cont miralax and senna    Family/ staff Communication: plan discussed with patient and nurse  Labs/tests ordered:  bmp/sed rate/vanc trough scheduled per ID

## 2021-02-12 ENCOUNTER — Ambulatory Visit: Payer: Medicare Other | Admitting: Infectious Disease

## 2021-02-12 LAB — CBC AND DIFFERENTIAL
HCT: 31 — AB (ref 41–53)
Hemoglobin: 9.5 — AB (ref 13.5–17.5)
Platelets: 351 (ref 150–399)
WBC: 7.1

## 2021-02-12 LAB — BASIC METABOLIC PANEL
BUN: 32 — AB (ref 4–21)
CO2: 27 — AB (ref 13–22)
Chloride: 100 (ref 99–108)
Creatinine: 1.2 (ref 0.6–1.3)
Glucose: 94
Potassium: 4.4 (ref 3.4–5.3)
Sodium: 136 — AB (ref 137–147)

## 2021-02-12 LAB — CBC: RBC: 3.59 — AB (ref 3.87–5.11)

## 2021-02-13 ENCOUNTER — Ambulatory Visit: Payer: Medicare Other | Admitting: Infectious Disease

## 2021-02-14 ENCOUNTER — Encounter: Payer: Self-pay | Admitting: Infectious Disease

## 2021-02-14 ENCOUNTER — Other Ambulatory Visit: Payer: Self-pay

## 2021-02-14 ENCOUNTER — Ambulatory Visit (INDEPENDENT_AMBULATORY_CARE_PROVIDER_SITE_OTHER): Payer: Medicare Other | Admitting: Infectious Disease

## 2021-02-14 VITALS — BP 142/85 | HR 85

## 2021-02-14 DIAGNOSIS — S72001K Fracture of unspecified part of neck of right femur, subsequent encounter for closed fracture with nonunion: Secondary | ICD-10-CM

## 2021-02-14 DIAGNOSIS — N1832 Chronic kidney disease, stage 3b: Secondary | ICD-10-CM

## 2021-02-14 DIAGNOSIS — M86172 Other acute osteomyelitis, left ankle and foot: Secondary | ICD-10-CM

## 2021-02-14 DIAGNOSIS — G20 Parkinson's disease: Secondary | ICD-10-CM | POA: Diagnosis not present

## 2021-02-14 DIAGNOSIS — G20A1 Parkinson's disease without dyskinesia, without mention of fluctuations: Secondary | ICD-10-CM

## 2021-02-14 DIAGNOSIS — A4902 Methicillin resistant Staphylococcus aureus infection, unspecified site: Secondary | ICD-10-CM

## 2021-02-14 DIAGNOSIS — S32411D Displaced fracture of anterior wall of right acetabulum, subsequent encounter for fracture with routine healing: Secondary | ICD-10-CM | POA: Diagnosis not present

## 2021-02-14 DIAGNOSIS — I82411 Acute embolism and thrombosis of right femoral vein: Secondary | ICD-10-CM | POA: Diagnosis not present

## 2021-02-14 LAB — BASIC METABOLIC PANEL
BUN: 37 — AB (ref 4–21)
CO2: 31 — AB (ref 13–22)
Chloride: 102 (ref 99–108)
Creatinine: 1.1 (ref 0.6–1.3)
Glucose: 93
Potassium: 4.3 (ref 3.4–5.3)
Sodium: 139 (ref 137–147)

## 2021-02-14 LAB — COMPREHENSIVE METABOLIC PANEL: Calcium: 8.2 — AB (ref 8.7–10.7)

## 2021-02-14 LAB — CBC AND DIFFERENTIAL
HCT: 31 — AB (ref 41–53)
Hemoglobin: 9.6 — AB (ref 13.5–17.5)
Neutrophils Absolute: 5123
Platelets: 337 (ref 150–399)
WBC: 7.5

## 2021-02-14 LAB — POCT ERYTHROCYTE SEDIMENTATION RATE, NON-AUTOMATED: Sed Rate: 48

## 2021-02-14 LAB — CBC: RBC: 3.63 — AB (ref 3.87–5.11)

## 2021-02-14 MED ORDER — SULFAMETHOXAZOLE-TRIMETHOPRIM 800-160 MG PO TABS: 1.0000 | ORAL_TABLET | Freq: Two times a day (BID) | ORAL | 11 refills | Status: DC

## 2021-02-14 NOTE — Progress Notes (Signed)
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   MRN: [id number]  HPI  Greg [name] is an 87 year old Caucasian man with a past medical history significant for parkinsonism, chronic kidney disease, hypertension recent fall and hip fracture on the right status post ORIF by Dr. Marcelino Scot.  He then developed a pressure ulcer apparently on his left heel that has worsened despite wound care at his skilled nursing facility, now with referral to wound care and being followed by Dr. Dellia Nims.  Ultimately a culture was taken from the wound although I do not have access to it which grew methicillin-resistant Staph aureus.  The patient had been on doxycycline and switched to Bactrim making me presume that it was doxycycline resistant.  He has now on intravenous vancomycin.  He was  referred to our clinic for further evaluation and management.  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  He was not psychologically ready for that at that time and still remains not ready for below the knee amputation.  He and his wife do understand that there is a risk of this infection spreading from his calcaneus into the bloodstream and causing sepsis which could hospitalize him could end his life, could lead to spread of infection to other organs.  I was able to get the antimicrobial sensitivity data faxed to [place] and is listed below:       Past Medical History:  Diagnosis Date   Acute bronchiolitis    Acute osteomyelitis of calcaneum, left ([place]) 01/11/2021   Anemia    Bursitis of both hips    Hearing reduced    hearing aid   History of basal cell carcinoma    Hyperlipidemia    Malaise and fatigue    MRSA infection 01/11/2021   Osteoarthritis    hand    Osteoporosis    Palpitation    Vitamin D deficiency     Past Surgical History:  Procedure Laterality Date   CATARACT EXTRACTION  05/31/2015   Eye/right   COLONOSCOPY  02/05/2005, 03/06/2005   normal, 10 years ago   MOHS micrographic surgery     Face surgery   ORIF ACETABULAR FRACTURE Right 10/09/2020   Procedure: OPEN REDUCTION INTERNAL FIXATION (ORIF) ACETABULAR FRACTURE;  Surgeon: Altamese Havana, MD;  Location: [facility];  Service: Orthopedics;  Laterality: Right;    Family History  Problem Relation Age of Onset   Prostate cancer Father    Skin cancer Brother       Social History   Socioeconomic History   Marital status: Married    Spouse name: Greg [name]   Number of children: 2   Years of education: Not on file   Highest education level: Not on file  Occupational History   Occupation: retired    Comment: art professor  Tobacco Use   Smoking status: Never   Smokeless tobacco: Never  Vaping Use   Vaping Use: Never used  Substance and Sexual Activity   Alcohol use: Never   Drug use: Never   Sexual activity: Not on file  Other Topics Concern   Not on file  Social History Narrative   Pt just move from [place] to [place] leaving with wife  Social Determinants of Health   Financial Resource Strain: Not on file  Food Insecurity: Not on file  Transportation Needs: Not on file  Physical Activity: Not on file  Stress: Not on file  Social Connections: Not on file    No Known Allergies   Current Outpatient Medications:    acetaminophen (TYLENOL) 325 MG tablet, Take 1-2 tablets (325-650 mg total) by mouth every 6 (six) hours as needed for mild pain (pain score 1-3 or temp > 100.5)., Disp: , Rfl:    albuterol (VENTOLIN HFA) 108 (90 Base) MCG/ACT inhaler, Inhale 2 puffs into the lungs every 4 (four) hours as needed for wheezing or shortness of breath., Disp: , Rfl:    Amino Acids-Protein Hydrolys (FEEDING SUPPLEMENT, PRO-STAT SUGAR FREE 64,) LIQD, Take 30 mLs by  mouth in the morning and at bedtime., Disp: , Rfl:    apixaban (ELIQUIS) 5 MG TABS tablet, Take 1 tablet (5 mg total) by mouth 2 (two) times daily., Disp: 60 tablet, Rfl: 3   Calcium Carbonate-Vitamin D (CALCIUM-VITAMIN D3 PO), Take 600 mg by mouth in the morning., Disp: , Rfl:    carbidopa-levodopa (SINEMET CR) 50-200 MG tablet, Take 1 tablet by mouth at bedtime., Disp: 90 tablet, Rfl: 1   carbidopa-levodopa (SINEMET IR) 25-100 MG tablet, Take 1 tablet by mouth 4 (four) times daily., Disp: 360 tablet, Rfl: 1   doxycycline (VIBRAMYCIN) 100 MG capsule, Take 100 mg by mouth 2 (two) times daily., Disp: , Rfl:    heparin flush 10 UNIT/ML SOLN injection, Inject 10 Units into the vein 2 (two) times daily. Flush with 5 mL of Heparin 10u/mL after Antibiotic administration and saline flush of 10 mL., Disp: , Rfl:    polyethylene glycol (MIRALAX) 17 g packet, Take 17 g by mouth daily., Disp: 14 each, Rfl: 0   potassium chloride SA (KLOR-CON) 20 MEQ tablet, Take 20 mEq by mouth 2 (two) times daily., Disp: , Rfl:    senna (SENOKOT) 8.6 MG TABS tablet, Take 1 tablet (8.6 mg total) by mouth daily., Disp: 120 tablet, Rfl: 0   sodium chloride irrigation 0.9 % irrigation, Irrigate with as directed 2 (two) times daily. Flush before Antibiotic PICC Administration with 10cc of NS, Disp: , Rfl:    torsemide (DEMADEX) 20 MG tablet, Take 20 mg by mouth daily. Give demadex 2 tablets (40mg ) PO daily, Disp: , Rfl:    torsemide (DEMADEX) 20 MG tablet, Take 1 tablet (20 mg total) by mouth daily for 2 days., Disp: 2 tablet, Rfl: 0   vancomycin 750 mg in sodium chloride 0.9 % 250 mL, Inject 750 mg into the vein every 12 (twelve) hours., Disp: , Rfl:    Vitamin D, Cholecalciferol, 25 MCG (1000 UT) TABS, Take 25 mcg by mouth daily., Disp: , Rfl:    zinc oxide 20 % ointment, Apply 1 application topically as needed for irritation., Disp: , Rfl:    Review of Systems  Unable to perform ROS: Dementia      Objective:   Physical  Exam Constitutional:      Appearance: He is well-developed.  HENT:     Head: Normocephalic and atraumatic.  Eyes:     Conjunctiva/sclera: Conjunctivae normal.  Cardiovascular:     Rate and Rhythm: Normal rate and regular rhythm.  Pulmonary:     Effort: Pulmonary effort is normal. No respiratory distress.     Breath sounds: No wheezing.  Abdominal:     General: There is no distension.  Palpations: Abdomen is soft.  Musculoskeletal:     Cervical back: Normal range of motion and neck supple.  Skin:    General: Skin is warm and dry.     Coloration: Skin is not pale.     Findings: No erythema or rash.  Neurological:     Mental Status: He is alert and oriented to person, place, and time.  Psychiatric:        Mood and Affect: Mood normal.        Behavior: Behavior normal.        Thought Content: Thought content normal.        Cognition and Memory: Memory is impaired. He exhibits impaired recent memory.        Judgment: Judgment normal.    Left heel ulcer 01/11/2021:    Left heel ulcer February 14, 2021:    PICC line:       Assessment & Plan:   Left calcaneal osteomyelitis:  As mentioned previously this is not curable with antibiotics.  He is following with Dr. Leanord Hawking who continues to observe exposed bone and necrotic material as documented in his note and is visible in the picture I took today.  He has had greater than 6 weeks of IV vancomycin at this point in time.  This time to change him to oral antibiotics and given the tetracycline resistance of his MRSA isolate we will place him on 1 double strength Bactrim tablet twice daily.  I would like him to have a CBC with differential and BMP with GFR 1 week after changing over to Bactrim twice daily.  I will plan on seeing him back in 1 month's time.  Parkinsonism: This complicates many things including his thoughts regarding whether or not a prosthesis would be something he could work with if he had a below the  knee amputation.  Hip fracture: Is healing DVT on anticoagulation.  I spent 41 minutes with the patient including face to face counseling of the patient and his wife regarding the nature of calcaneal osteomyelitis reviewing culture data from skilled nursing facility BMP CBC with differential sed rate CRP along with review of medical records before and during the visit and in coordination of his care.

## 2021-02-18 ENCOUNTER — Non-Acute Institutional Stay (SKILLED_NURSING_FACILITY): Payer: Medicare Other | Admitting: Orthopedic Surgery

## 2021-02-18 ENCOUNTER — Encounter: Payer: Self-pay | Admitting: Orthopedic Surgery

## 2021-02-18 DIAGNOSIS — M810 Age-related osteoporosis without current pathological fracture: Secondary | ICD-10-CM

## 2021-02-18 DIAGNOSIS — I82411 Acute embolism and thrombosis of right femoral vein: Secondary | ICD-10-CM | POA: Diagnosis not present

## 2021-02-18 DIAGNOSIS — D638 Anemia in other chronic diseases classified elsewhere: Secondary | ICD-10-CM

## 2021-02-18 DIAGNOSIS — I509 Heart failure, unspecified: Secondary | ICD-10-CM

## 2021-02-18 DIAGNOSIS — B999 Unspecified infectious disease: Secondary | ICD-10-CM

## 2021-02-18 DIAGNOSIS — G20 Parkinson's disease: Secondary | ICD-10-CM

## 2021-02-18 DIAGNOSIS — I10 Essential (primary) hypertension: Secondary | ICD-10-CM | POA: Diagnosis not present

## 2021-02-18 DIAGNOSIS — M86172 Other acute osteomyelitis, left ankle and foot: Secondary | ICD-10-CM

## 2021-02-18 DIAGNOSIS — K5901 Slow transit constipation: Secondary | ICD-10-CM

## 2021-02-18 NOTE — Progress Notes (Signed)
Location:   [place] Room Number: 27 Place of Service:  SNF [phone]) Provider:  Windell Moulding, NP    Patient Care Team: Virgie Dad, MD as PCP - General (Internal Medicine) [name], Eustace Quail, DO as Consulting Physician (Neurology)  Extended Emergency Contact Information Primary Emergency Contact: Vergia Alcon Address: [place].          apt [phone] Johnnette Litter of [place] Phone: [phone] Mobile Phone: [phone] Relation: Spouse Secondary Emergency Contact: rourke, kase Mobile Phone: [phone] Relation: Son  Code Status:  FULL CODE Goals of care: Advanced Directive information Advanced Directives 02/18/2021  Does Patient Have a Medical Advance Directive? Yes  Type of Advance Directive Living will  Does patient want to make changes to medical advance directive? No - Patient declined  Would patient like information on creating a medical advance directive? -     Chief Complaint  Patient presents with   Medical Management of Chronic Issues    Routine Visit.     HPI:  Pt is a 87 y.o. male seen today for medical management of chronic diseases.    He currently resides on the skilled nursing unit at [facility]. [place] includes: CHF, DVT, HTN, constipation, parkinson's, left heel osteomyelitis,  osteoporosis, BPH, CKD stage 3, and prediabetes.   CHF/lower leg edema: 11/04 he was switched to torsemide due to recent weight gain/ankle edema/cough, reports cough has subsided and breathing/ankle edema has improved, remains on torsemide 40 mg daily, Echo scheduled 02/19/2021 Left heel wound- diagnosed with left calcaneous osteomyelitis, wound culture indicated MRSA, followed by wound care center and ID, PICC line and IV vanc discontinued, remains on Bactrim DS, dressing changes Santyl and silver alginate daily, ABI completed no stenosis- consistent with tibial outflow disease, 11/01 seen by Dr. Dellia Nims reports less necrotic  tissue/ bone is exposed, remains extensive necrotic wound, patient refused vascular surgery consult DVT of deep femoral vein- diagnosed 12/2020, remains on Eliquis Parkinson's- followed by neurology, remains on sinemet HTN- BUN/creat 32/1.18 02/11/2021, off carvedilol at this time Anemia- hgb 9.5 02/11/2021, no apparent blood loss at this time, FOBT x3- one positive result  Osteoporosis- ORIF right hip 10/10/2020, ambulating with walker, stopped fosamax 2018 (6 years use), remains on calcium and vitamin D supplements Constipation- LBM 11/13, remains on miralax and senna  No recent falls, injuries or behavioral outbursts.   Recent blood pressures:  11/12- 105/56  11/11- 133/66  11/10- 133/72  Recent weights:  11/13- 186.6 lbs  11/09- 191.7 lbs  11/04- 207.5 lbs  10/30- 206.3 lbs  10/14- 188 lbs   Past Medical History:  Diagnosis Date   Acute bronchiolitis    Acute osteomyelitis of calcaneum, left ([place]) 01/11/2021   Anemia    Bursitis of both hips    Hearing reduced    hearing aid   History of basal cell carcinoma    Hyperlipidemia    Malaise and fatigue    MRSA infection 01/11/2021   Osteoarthritis    hand   Osteoporosis    Palpitation    Vitamin D deficiency    Past Surgical History:  Procedure Laterality Date   CATARACT EXTRACTION  05/31/2015   Eye/right   COLONOSCOPY  02/05/2005, 03/06/2005   normal, 10 years ago   MOHS micrographic surgery     Face surgery   ORIF ACETABULAR FRACTURE Right 10/09/2020   Procedure: OPEN REDUCTION INTERNAL FIXATION (ORIF) ACETABULAR FRACTURE;  Surgeon: Altamese Borden, MD;  Location: [facility]  OR;  Service: Orthopedics;  Laterality: Right;    No Known Allergies  Allergies as of 02/18/2021   No Known Allergies      Medication List        Accurate as of February 18, 2021  9:55 AM. If you have any questions, ask your nurse or doctor.          STOP taking these medications    acetaminophen 325 MG tablet Commonly known as:  TYLENOL Stopped by: Yvonna Alanis, NP   heparin flush 10 UNIT/ML Soln injection Stopped by: Yvonna Alanis, NP   sodium chloride irrigation 0.9 % irrigation Stopped by: Yvonna Alanis, NP       TAKE these medications    albuterol 108 (90 Base) MCG/ACT inhaler Commonly known as: VENTOLIN HFA Inhale 2 puffs into the lungs every 4 (four) hours as needed for wheezing or shortness of breath.   apixaban 5 MG Tabs tablet Commonly known as: ELIQUIS Take 1 tablet (5 mg total) by mouth 2 (two) times daily.   CALCIUM-VITAMIN D3 PO Take 600 mg by mouth in the morning.   carbidopa-levodopa 25-100 MG tablet Commonly known as: SINEMET IR Take 1 tablet by mouth 4 (four) times daily.   carbidopa-levodopa 50-200 MG tablet Commonly known as: SINEMET CR Take 1 tablet by mouth at bedtime.   collagenase ointment Commonly known as: SANTYL Apply 1 application topically daily.   feeding supplement (PRO-STAT SUGAR FREE 64) Liqd Take 30 mLs by mouth in the morning and at bedtime.   polyethylene glycol 17 g packet Commonly known as: MiraLax Take 17 g by mouth daily.   potassium chloride SA 20 MEQ tablet Commonly known as: KLOR-CON Take 20 mEq by mouth 2 (two) times daily.   senna 8.6 MG Tabs tablet Commonly known as: SENOKOT Take 1 tablet (8.6 mg total) by mouth daily.   sulfamethoxazole-trimethoprim 800-160 MG tablet Commonly known as: BACTRIM DS Take 1 tablet by mouth 2 (two) times daily.   torsemide 20 MG tablet Commonly known as: DEMADEX Take 20 mg by mouth daily. Give demadex 2 tablets (40mg ) PO daily   torsemide 20 MG tablet Commonly known as: DEMADEX Take 1 tablet (20 mg total) by mouth daily for 2 days.   Vitamin D (Cholecalciferol) 25 MCG (1000 UT) Tabs Take 25 mcg by mouth daily.   zinc oxide 20 % ointment Apply 1 application topically as needed for irritation.        Review of Systems  Constitutional:  Negative for activity change, appetite change, chills, fatigue  and fever.  HENT:  Negative for dental problem and trouble swallowing.   Eyes:  Negative for visual disturbance.       Glasses  Respiratory:  Negative for cough, shortness of breath and wheezing.   Cardiovascular:  Positive for leg swelling. Negative for chest pain.  Gastrointestinal:  Positive for constipation. Negative for abdominal distention, abdominal pain, blood in stool, diarrhea, nausea, rectal pain and vomiting.  Genitourinary:  Negative for dysuria, frequency and hematuria.  Musculoskeletal:  Positive for arthralgias, back pain and gait problem.  Skin:        left heel osteomyelitis  Neurological:  Positive for tremors and weakness. Negative for dizziness and headaches.  Psychiatric/Behavioral:  Negative for confusion, dysphoric mood and sleep disturbance. The patient is not nervous/anxious.    Immunization History  Administered Date(s) Administered   H1N1 06/13/2008   Influenza Split 03/05/2006, 02/26/2007, 01/26/2008, 01/18/2009, 12/12/2009, 01/16/2011, 01/29/2012, 02/01/2013, 02/08/2014, 02/13/2015, 01/10/2016, 02/23/2017,  02/24/2018, 01/24/2019, 12/22/2019   Influenza, High Dose Seasonal PF 01/30/2021   PFIZER(Purple Top)SARS-COV-2 Vaccination 05/04/2019, 05/25/2019, 09/01/2020   Pfizer Covid-19 Vaccine Bivalent Booster 65yrs & up 12/27/2019, 12/26/2020   Pneumococcal Conjugate-13 02/10/2014   Pneumococcal Polysaccharide-23 06/10/2011   Zoster, Live 07/04/2009   Pertinent  Health Maintenance Due  Topic Date Due   INFLUENZA VACCINE  Completed   Fall Risk 10/11/2020 10/11/2020 10/11/2020 10/12/2020 01/10/2021  Falls in the past year? - - - - 1  Was there an injury with Fall? - - - - 1  Fall Risk Category Calculator - - - - 2  Fall Risk Category - - - - Moderate  Patient Fall Risk Level High fall risk High fall risk High fall risk High fall risk High fall risk   Functional Status Survey:    Vitals:   02/18/21 0938  BP: (!) 105/56  Pulse: 75  Resp: 20  Temp: 97.7 F (36.5  C)  SpO2: 95%  Weight: 186 lb 9.6 oz (84.6 kg)  Height: 5\' 9"  (1.753 m)   Body mass index is 27.56 kg/m. Physical Exam Vitals reviewed.  Constitutional:      General: He is not in acute distress. HENT:     Head: Normocephalic.     Right Ear: There is no impacted cerumen.     Left Ear: There is no impacted cerumen.     Nose: Nose normal.     Mouth/Throat:     Mouth: Mucous membranes are moist.  Eyes:     General:        Right eye: No discharge.        Left eye: No discharge.  Neck:     Vascular: No carotid bruit.  Cardiovascular:     Rate and Rhythm: Normal rate and regular rhythm.     Pulses: Normal pulses.     Heart sounds: Murmur heard.  Pulmonary:     Effort: Pulmonary effort is normal. No respiratory distress.     Breath sounds: Normal breath sounds. No wheezing.  Abdominal:     General: Bowel sounds are normal. There is no distension.     Palpations: Abdomen is soft.     Tenderness: There is no abdominal tenderness.  Musculoskeletal:     Cervical back: Normal range of motion.     Right lower leg: Edema present.     Left lower leg: Edema present.     Comments: L>R +1 pitting   Lymphadenopathy:     Cervical: No cervical adenopathy.  Skin:    General: Skin is warm and dry.     Capillary Refill: Capillary refill takes less than 2 seconds.     Comments: UTA left heel due to dressing, no odor noted, surrounding skin intact  Neurological:     General: No focal deficit present.     Mental Status: He is alert and oriented to person, place, and time.     Motor: Weakness present.     Gait: Gait abnormal.     Comments: Resting hand tremor/ WC  Psychiatric:        Mood and Affect: Mood normal.        Behavior: Behavior normal.    Labs reviewed: Recent Labs    10/09/20 0546 10/09/20 0937 10/10/20 0607 10/11/20 0037 10/12/20 0146 10/25/20 0000 01/12/21 0000 01/15/21 0000 02/07/21 0000 02/12/21 0000  NA  --    < > 131* 129* 132*   < > 135* 137 134* 136*  K   --    < >  4.2 4.5 4.1   < > 5.0 4.4 4.5 4.4  CL  --    < > 97* 96* 97*   < > 103 104 100 100  CO2  --    < > 28 29 28    < > 27* 23* 27* 27*  GLUCOSE  --    < > 134* 114* 122*  --   --   --   --   --   BUN  --    < > 23 26* 24*   < > 29* 24* 26* 32*  CREATININE  --    < > 1.20 1.14 1.14   < > 1.3 1.3 1.0 1.2  CALCIUM  --    < > 8.7* 8.6* 8.4*   < > 8.5* 8.5* 8.3*  --   MG  --   --  2.1  --   --   --   --   --   --   --   PHOS 3.4  --   --   --  3.5  --   --   --   --   --    < > = values in this interval not displayed.   Recent Labs    10/09/20 0937 10/11/20 0037 10/12/20 0146 12/26/20 0000 12/27/20 0000  AST 22 34  --  19 18  ALT 7 <5  --  9* 6*  ALKPHOS 31* 24*  --  55 46  BILITOT 1.7* 1.1  --   --   --   PROT 5.9* 4.6*  --   --   --   ALBUMIN 3.4* 2.6* 2.6* 3.7 2.9*   Recent Labs    10/10/20 0607 10/11/20 0037 10/12/20 0146 10/25/20 0000 11/19/20 0000 12/27/20 0000 01/15/21 0000 02/12/21 0000  WBC 13.8* 10.1 8.5 7.1   < > 7.8 7.0 7.1  NEUTROABS  --   --  6.4 5,013.00  --  5,694.00 4,515.00  --   HGB 8.7* 7.4* 7.8* 8.9*   < > 10.1* 10.3* 9.5*  HCT 25.3* 21.8* 22.8* 27*   < > 31* 33* 31*  MCV 96.2 96.9 96.6  --   --   --   --   --   PLT 93* 90* 116* 293   < > 283 267 351   < > = values in this interval not displayed.   No results found for: TSH Lab Results  Component Value Date   HGBA1C 5.9 (H) 10/08/2020   No results found for: CHOL, HDL, LDLCALC, LDLDIRECT, TRIG, CHOLHDL  Significant Diagnostic Results in last 30 days:  VAS 12/09/2020 ABI WITH/WO TBI  Result Date: 01/21/2021  LOWER EXTREMITY DOPPLER STUDY Patient Name:  HAIDAN NHAN  Date of Exam:   01/21/2021 Medical Rec #: 01/23/2021      Accession #:    [id number] Date of Birth: [birth year]      Patient Gender: M Patient Age:   87 years Exam Location:  [facility] Procedure:      VAS Rudene Anda ABI WITH/WO TBI Referring Phys: MICHAEL ROBSON  --------------------------------------------------------------------------------  High Risk Factors: Hypertension, hyperlipidemia. Other Factors: Non healing left heel wound.  Performing Technologist: [place] RVT  Examination Guidelines: A complete evaluation includes at minimum, Doppler waveform signals and systolic blood pressure reading at the level of bilateral brachial, anterior tibial, and posterior tibial arteries, when vessel segments are accessible. Bilateral testing is considered an integral part of a complete examination. Photoelectric Plethysmograph (PPG)  waveforms and toe systolic pressure readings are included as required and additional duplex testing as needed. Limited examinations for reoccurring indications may be performed as noted.  ABI Findings: +---------+------------------+-----+---------+--------------------+ Right    Rt Pressure (mmHg)IndexWaveform Comment              +---------+------------------+-----+---------+--------------------+ Brachial                                 Indwelling PICC line +---------+------------------+-----+---------+--------------------+ PTA      151               1.08 biphasic                      +---------+------------------+-----+---------+--------------------+ DP       158               1.13 triphasic                     +---------+------------------+-----+---------+--------------------+ Great Toe75                0.54                               +---------+------------------+-----+---------+--------------------+ +---------+------------------+-----+----------+-------+ Left     Lt Pressure (mmHg)IndexWaveform  Comment +---------+------------------+-----+----------+-------+ Brachial 140                                      +---------+------------------+-----+----------+-------+ ATA      255                    monophasic        +---------+------------------+-----+----------+-------+ PTA      255                1.82 biphasic          +---------+------------------+-----+----------+-------+ Great Toe52                0.37                   +---------+------------------+-----+----------+-------+ +-------+----------------+-----------+------------+------------+ ABI/TBIToday's ABI     Today's TBIPrevious ABIPrevious TBI +-------+----------------+-----------+------------+------------+ Right  1.13            0.54                                +-------+----------------+-----------+------------+------------+ Left   Non compressible0.37                                +-------+----------------+-----------+------------+------------+  Summary: Right: Resting right ankle-brachial index is within normal range. No evidence of significant right lower extremity arterial disease. The right toe-brachial index is abnormal. Left: Resting left ankle-brachial index indicates noncompressible left lower extremity arteries. The left toe-brachial index is abnormal.  *See table(s) above for measurements and observations.  Electronically signed by Orlie Pollen on 01/21/2021 at 7:06:40 PM.    Final    VAS [place] LOWER EXTREMITY ARTERIAL DUPLEX  Result Date: 01/21/2021 LOWER EXTREMITY ARTERIAL DUPLEX STUDY Patient Name:  FIDEL TOTA  Date of Exam:   01/21/2021 Medical Rec #: [id number]      Accession #:    [id number] Date of Birth: 01/03/1932  Patient Gender: M Patient Age:   30 years Exam Location:  [facility] Procedure:      VAS [place] LOWER EXTREMITY ARTERIAL DUPLEX Referring Phys: MICHAEL ROBSON --------------------------------------------------------------------------------  Indications: Ulceration, and peripheral artery disease. High Risk Factors: Hypertension. Other Factors: Non healing wound left heel. MRSA.  Current ABI: Right: 1.13, Left: non compressible Performing Technologist: Delorise Shiner RVT  Examination Guidelines: A complete evaluation includes B-mode imaging, spectral Doppler, color  Doppler, and power Doppler as needed of all accessible portions of each vessel. Bilateral testing is considered an integral part of a complete examination. Limited examinations for reoccurring indications may be performed as noted.   +----------+--------+-----+--------+----------+--------+ LEFT      PSV cm/sRatioStenosisWaveform  Comments +----------+--------+-----+--------+----------+--------+ CFA Distal143                  triphasic          +----------+--------+-----+--------+----------+--------+ DFA       76                   triphasic          +----------+--------+-----+--------+----------+--------+ SFA Prox  88                   triphasic          +----------+--------+-----+--------+----------+--------+ SFA Mid   90                   monophasic         +----------+--------+-----+--------+----------+--------+ SFA Distal119                  triphasic          +----------+--------+-----+--------+----------+--------+ POP Prox  53                   triphasic          +----------+--------+-----+--------+----------+--------+ POP Distal90                   triphasic          +----------+--------+-----+--------+----------+--------+ ATA Distal61                   monophasic         +----------+--------+-----+--------+----------+--------+ PTA Distal92                   monophasic         +----------+--------+-----+--------+----------+--------+ No focal hemodynamically significant stenosis identified. Waveform analysis is consistent with occlusive disease in the proximal calf tibial outflow arteries.  Summary: Left: Findings are consistent with tibial outflow disease. No other hemodynamically significant stenosis is identified.  See table(s) above for measurements and observations. Electronically signed by Orlie Pollen on 01/21/2021 at 7:06:29 PM.    Final     Assessment/Plan 1. Congestive heart failure, unspecified HF chronicity, unspecified heart  failure type ([place]) - weight trending down since switching to torsemide - lungs clear, reduced ankle edema L>R 1+ pitting  - will decrease torsemide to 20 mg daily  - cont daily weights - echo scheduled 02/19/2021  2. Acute osteomyelitis of left calcaneus ([facility]) - followed by Dr. Dellia Nims and ID - PICC/ IV vanc recently discontinued - cont Bactrim - cont daily dressing changes with Santyl and silver alginate - discussed benefits of having surgical consult to understand options  3. DVT of deep femoral vein, right ([place]) - diagnosed 12/2020 - cont Eliquis  4. Parkinson's disease ([place]) - followed by Dr. Carles Collet - cont sinemet - cont skilled  nursing care  5. Primary hypertension - controlled without meds  6. 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 Age-related osteoporosis without current pathological fracture - cont calcium and vitamin D  8. Slow transit constipation - LBM 11/13 - cont senna and miralax    Family/ staff Communication: plan discussed with patient, wife and nurse  Labs/tests ordered: none

## 2021-02-19 ENCOUNTER — Other Ambulatory Visit: Payer: Self-pay

## 2021-02-19 ENCOUNTER — Encounter (HOSPITAL_BASED_OUTPATIENT_CLINIC_OR_DEPARTMENT_OTHER): Payer: Medicare Other | Admitting: Internal Medicine

## 2021-02-19 DIAGNOSIS — L89620 Pressure ulcer of left heel, unstageable: Secondary | ICD-10-CM | POA: Diagnosis not present

## 2021-02-19 NOTE — Progress Notes (Signed)
[name] ([id number]) , Visit Report for 02/19/2021 Debridement Details Patient Name: Date of Service: Greg [name] [facility] 02/19/2021 10:30 A M Medical Record Number: [id number] Patient Account Number: [id number] Date of Birth/Sex: Treating RN: [birth year] (87 y.o. M) Primary Care Provider: Veleta Miners Other Clinician: Referring Provider: Treating Provider/Extender: Carley Hammed in Treatment: 6 Debridement Performed for Assessment: Wound #1 Left Calcaneus Performed By: Physician Ricard Dillon., MD Debridement Type: Debridement Level of Consciousness (Pre-procedure): Awake and Alert Pre-procedure Verification/Time Out Yes - 11:34 Taken: Start Time: 11:35 T Area Debrided (L x W): otal 5 (cm) x 6 (cm) = 30 (cm) Tissue and other material debrided: Non-Viable, Eschar, Slough, Subcutaneous, Slough Level: Skin/Subcutaneous Tissue Debridement Description: Excisional Instrument: Forceps, Scissors Bleeding: Minimum Hemostasis Achieved: Pressure End Time: 11:41 Response to Treatment: Procedure was tolerated well Level of Consciousness (Post- Awake and Alert procedure): Post Debridement Measurements of Total Wound Length: (cm) 5 Stage: Unstageable/Unclassified Width: (cm) 6 Depth: (cm) 0.8 Volume: (cm) 18.85 Character of Wound/Ulcer Post Debridement: Stable Post Procedure Diagnosis Same as Pre-procedure Electronic Signature(s) Signed: 02/19/2021 4:37:23 PM By: Linton Ham MD Entered By: Linton Ham on 02/19/2021 12:37:38 -------------------------------------------------------------------------------- HPI Details Patient Name: Date of Service: Greg [name], [name] 02/19/2021 10:30 A M Medical Record Number: [id number] Patient Account Number: [id number] Date of Birth/Sex: Treating RN: 02-04-1932 (87 y.o. M) Primary Care Provider: Veleta Miners Other Clinician: Referring Provider: Treating Provider/Extender: Carley Hammed  in Treatment: 6 History of Present Illness HPI Description: ADMISSION 01/04/2021 [name] is a pleasant 87 year old woman who lives at [place] in the skilled facility. He is accompanied by his wife. I did previously been notified by Dr. Lyndel Safe primary physician about this patient. He apparently fractured his right acetabulum in July. Sometime in the recovery of this developed a pressure ulcer on the right heel. When I was first contacted by Dr. Lyndel Safe this was described as a necrotic wound and indeed this is an accurate description. Unfortunately cultures of this have shown MRSA and after 1 negative x-ray a subsequent x-ray on 01/02/2021 showed changes of osteomyelitis. The patient was started on IV vancomycin at the facility and I believe is also on oral trimethoprim/sulfamethoxazole. They are using silver alginate which was done at my suggestion. Sedimentation rate and the said facility was 55 CRP P was also elevated at 73.7 compatible with his osteomyelitis Past medical history includes a right acetabular fracture, DVT in the right femoral vein also in September on Eliquis, fairly severe Parkinson's disease. He has had [facility] [place] in the skilled facility. ABI in our clinic was noncompressible on the left 10/11; patient presents for follow-up. He has no issues or complaints today. He saw Dr. Tommy Medal on 10/7. He is currently on IV vancomycin. He currently denies systemic signs of infection. 10/18 remains on IV vancomycin. Santyl and silver alginate to the wound bed. [facility] [place]. His wound has made some minor progress there is not as much necrotic tissue and some viable tissue on the surface however there is still an odor. The patient had noninvasive arterial tests done at my suggestion. On the left his ABI was noncompressible his TBI was 0.37 with monophasic and biphasic waveforms. Arterial Doppler showed monophasic waveforms at the ATA  and PTA. However there was no focally hemodynamic significant stenosis identified. 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 Remains on IV vancomycin difficult to get consistent answers but it  seems like they were not interested in the vascular surgery consult at this time. The wound itself somewhat improved in terms of the amount of necrotic material but still a very difficult necrotic wound. There is exposed bone. We are using Santyl and silver alginate 11/15; he has completed her IV antibiotics and now is on trimethoprim sulfamethoxazole although I did not look over infectious disease notes. The wound is not really improved in size although the condition of the wound surface looks somewhat better there is still exposed bone. We have been using Santyl and silver alginate [facility] at [facility] I think it is quite possible the patient has significant PAD however the patient and his wife were not interested in consideration of a vascular referral therefore I have not made this Electronic Signature(s) Signed: 02/19/2021 4:37:23 PM By: Linton Ham MD Entered By: Linton Ham on 02/19/2021 12:39:20 -------------------------------------------------------------------------------- Physical Exam Details Patient Name: Date of Service: Greg [name], [name] 02/19/2021 10:30 A M Medical Record Number: [id number] Patient Account Number: [id number] Date of Birth/Sex: Treating RN: 02-Dec-1931 (87 y.o. M) Primary Care Provider: Veleta Miners Other Clinician: Referring Provider: Treating Provider/Extender: Carley Hammed in Treatment: 6 Constitutional Sitting or standing Blood Pressure is within target range for patient.. Pulse regular and within target range for patient.Marland Kitchen Respirations regular, non-labored and within target range.. Temperature is normal and within the target range for the patient.Marland Kitchen Appears in no distress. Notes Wound exam; left heel. Still  large areas of necrotic material which I removed with pickups and scissors. Minimal bleeding. There is still exposed bone. No overt infection around the soft tissue of the wound. Electronic Signature(s) Signed: 02/19/2021 4:37:23 PM By: Linton Ham MD Entered By: Linton Ham on 02/19/2021 12:40:35 -------------------------------------------------------------------------------- Physician Orders Details Patient Name: Date of Service: Greg [name], [name] 02/19/2021 10:30 A M Medical Record Number: [id number] Patient Account Number: [id number] Date of Birth/Sex: Treating RN: Nov 26, 1931 (87 y.o. Marcheta Grammes Primary Care Provider: Veleta Miners Other Clinician: Referring Provider: Treating Provider/Extender: Carley Hammed in Treatment: 6 Verbal / Phone Orders: No Diagnosis Coding Follow-up Appointments Return appointment in 3 weeks. - Dr. Dellia Nims Bathing/ Shower/ Hygiene May shower with protection but do not get wound dressing(s) wet. Edema Control - Lymphedema / SCD / Other Elevate legs to the level of the heart or above for 30 minutes daily and/or when sitting, a frequency of: - throughout the day Off-Loading Turn and reposition every 2 hours Other: - Float heels off of bed/chair with pillow under calves Wound Treatment Wound #1 - Calcaneus Wound Laterality: Left Cleanser: Wound Cleanser Every Other Day/15 Days Discharge Instructions: Cleanse the wound with wound cleanser prior to applying a clean dressing using gauze sponges, not tissue or cotton balls. Prim Dressing: Hydrofera Blue Ready Foam, 2.5 x2.5 in Every Other Day/15 Days ary Discharge Instructions: Apply to wound bed as instructed Prim Dressing: Santyl Ointment Every Other Day/15 Days ary Discharge Instructions: Apply nickel thick amount to wound bed, under silver alginate Secondary Dressing: Woven Gauze Sponge, Non-Sterile 4x4 in Every Other Day/15 Days Discharge Instructions: Apply over  primary dressing as directed. Secondary Dressing: ALLEVYN Heel 4 1/2in x 5 1/2in / 10.5cm x 13.5cm Every Other Day/15 Days Discharge Instructions: Apply over primary dressing as directed. Secured With: [employer], 4.5x3.1 (in/yd) Every Other Day/15 Days Discharge Instructions: Secure with Kerlix as directed. Secured With: Paper Tape, 2x10 (in/yd) Every Other Day/15 Days Discharge Instructions: Secure dressing with tape as directed. Electronic  Signature(s) Signed: 02/19/2021 4:37:23 PM By: Linton Ham MD Signed: 02/19/2021 4:38:08 PM By: Lorrin Jackson Entered By: Lorrin Jackson on 02/19/2021 11:52:01 -------------------------------------------------------------------------------- Problem List Details Patient Name: Date of Service: Greg [name], [name] 02/19/2021 10:30 A M Medical Record Number: [id number] Patient Account Number: [id number] Date of Birth/Sex: Treating RN: 02/09/1932 (87 y.o. M) Primary Care Provider: Veleta Miners Other Clinician: Referring Provider: Treating Provider/Extender: Carley Hammed in Treatment: 6 Active Problems ICD-10 Encounter Code Description Active Date MDM Diagnosis L89.620 Pressure ulcer of left heel, unstageable 01/04/2021 No Yes M86.172 Other acute osteomyelitis, left ankle and foot 01/04/2021 No Yes B95.62 Methicillin resistant Staphylococcus aureus infection as the cause of diseases 01/04/2021 No Yes classified elsewhere Inactive Problems Resolved Problems Electronic Signature(s) Signed: 02/19/2021 4:37:23 PM By: Linton Ham MD Entered By: Linton Ham on 02/19/2021 12:32:12 -------------------------------------------------------------------------------- Progress Note Details Patient Name: Date of Service: Greg [name], [name] 02/19/2021 10:30 A M Medical Record Number: [id number] Patient Account Number: [id number] Date of Birth/Sex: Treating RN: 1931-10-30 (87 y.o. M) Primary Care Provider: Veleta Miners  Other Clinician: Referring Provider: Treating Provider/Extender: Carrie Mew, Ihor Gully in Treatment: 6 Subjective History of Present Illness (HPI) ADMISSION 01/04/2021 Mr. Spomer is a pleasant 87 year old woman who lives at [place] in the skilled facility. He is accompanied by his wife. I did previously been notified by Dr. Lyndel Safe primary physician about this patient. He apparently fractured his right acetabulum in July. Sometime in the recovery of this developed a pressure ulcer on the right heel. When I was first contacted by Dr. Lyndel Safe this was described as a necrotic wound and indeed this is an accurate description. Unfortunately cultures of this have shown MRSA and after 1 negative x-ray a subsequent x-ray on 01/02/2021 showed changes of osteomyelitis. The patient was started on IV vancomycin at the facility and I believe is also on oral trimethoprim/sulfamethoxazole. They are using silver alginate which was done at my suggestion. Sedimentation rate and the said facility was 55 CRP P was also elevated at 73.7 compatible with his osteomyelitis Past medical history includes a right acetabular fracture, DVT in the right femoral vein also in September on Eliquis, fairly severe Parkinson's disease. He has had [facility] [place] in the skilled facility. ABI in our clinic was noncompressible on the left 10/11; patient presents for follow-up. He has no issues or complaints today. He saw Dr. Tommy Medal on 10/7. He is currently on IV vancomycin. He currently denies systemic signs of infection. 10/18 remains on IV vancomycin. Santyl and silver alginate to the wound bed. [facility] is at [facility] [place]. His wound has made some minor progress there is not as much necrotic tissue and some viable tissue on the surface however there is still an odor. The patient had noninvasive arterial tests done at my suggestion. On the left his ABI was noncompressible his TBI  was 0.37 with monophasic and biphasic waveforms. Arterial Doppler showed monophasic waveforms at the ATA and PTA. However there was no focally hemodynamic significant stenosis identified. 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 Remains on IV vancomycin difficult to get consistent answers but it seems like they were not interested in the vascular surgery consult at this time. The wound itself somewhat improved in terms of the amount of necrotic material but still a very difficult necrotic wound. There is exposed bone. We are using Santyl and silver alginate 11/15; he has completed her IV antibiotics and now is  on trimethoprim sulfamethoxazole although I did not look over infectious disease notes. The wound is not really improved in size although the condition of the wound surface looks somewhat better there is still exposed bone. We have been using Santyl and silver alginate [facility] at [facility] I think it is quite possible the patient has significant PAD however the patient and his wife were not interested in consideration of a vascular referral therefore I have not made this Objective Constitutional Sitting or standing Blood Pressure is within target range for patient.. Pulse regular and within target range for patient.Marland Kitchen Respirations regular, non-labored and within target range.. Temperature is normal and within the target range for the patient.Marland Kitchen Appears in no distress. Vitals Time Taken: 11:03 AM, Height: 73 in, Weight: 180 lbs, BMI: 23.7, Temperature: 98.0 F, Pulse: 84 bpm, Respiratory Rate: 16 breaths/min, Blood Pressure: 129/74 mmHg. General Notes: Wound exam; left heel. Still large areas of necrotic material which I removed with pickups and scissors. Minimal bleeding. There is still exposed bone. No overt infection around the soft tissue of the wound. Integumentary (Hair, Skin) Wound #1 status is Open. Original cause of wound was Blister. The date acquired  was: 10/24/2020. The wound has been in treatment 6 weeks. The wound is located on the Left Calcaneus. The wound measures 5cm length x 6cm width x 0.8cm depth; 23.562cm^2 area and 18.85cm^3 volume. There is Fat Layer (Subcutaneous Tissue) exposed. There is no tunneling or undermining noted. There is a large amount of purulent drainage noted. The wound margin is distinct with the outline attached to the wound base. There is small (1-33%) pink, pale granulation within the wound bed. There is a large (67-100%) amount of necrotic tissue within the wound bed including Eschar and Adherent Slough. Assessment Active Problems ICD-10 Pressure ulcer of left heel, unstageable Other acute osteomyelitis, left ankle and foot Methicillin resistant Staphylococcus aureus infection as the cause of diseases classified elsewhere Procedures Wound #1 Pre-procedure diagnosis of Wound #1 is a Pressure Ulcer located on the Left Calcaneus . There was a Excisional Skin/Subcutaneous Tissue Debridement with a total area of 30 sq cm performed by Ricard Dillon., MD. With the following instrument(s): Forceps, and Scissors to remove Non-Viable tissue/material. Material removed includes Eschar, Subcutaneous Tissue, and Slough. No specimens were taken. A time out was conducted at 11:34, prior to the start of the procedure. A Minimum amount of bleeding was controlled with Pressure. The procedure was tolerated well. Post Debridement Measurements: 5cm length x 6cm width x 0.8cm depth; 18.85cm^3 volume. Post debridement Stage noted as Unstageable/Unclassified. Character of Wound/Ulcer Post Debridement is stable. Post procedure Diagnosis Wound #1: Same as Pre-Procedure Plan Follow-up Appointments: Return appointment in 3 weeks. - Dr. Dellia Nims Bathing/ Shower/ Hygiene: May shower with protection but do not get wound dressing(s) wet. Edema Control - Lymphedema / SCD / Other: Elevate legs to the level of the heart or above for 30  minutes daily and/or when sitting, a frequency of: - throughout the day Off-Loading: Turn and reposition every 2 hours Other: - Float heels off of bed/chair with pillow under calves WOUND #1: - Calcaneus Wound Laterality: Left Cleanser: Wound Cleanser Every Other Day/15 Days Discharge Instructions: Cleanse the wound with wound cleanser prior to applying a clean dressing using gauze sponges, not tissue or cotton balls. Prim Dressing: Hydrofera Blue Ready Foam, 2.5 x2.5 in Every Other Day/15 Days ary Discharge Instructions: Apply to wound bed as instructed Prim Dressing: Santyl Ointment Every Other Day/15 Days ary Discharge  Instructions: Apply nickel thick amount to wound bed, under silver alginate Secondary Dressing: Woven Gauze Sponge, Non-Sterile 4x4 in Every Other Day/15 Days Discharge Instructions: Apply over primary dressing as directed. Secondary Dressing: ALLEVYN Heel 4 1/2in x 5 1/2in / 10.5cm x 13.5cm Every Other Day/15 Days Discharge Instructions: Apply over primary dressing as directed. Secured With: [employer], 4.5x3.1 (in/yd) Every Other Day/15 Days Discharge Instructions: Secure with Kerlix as directed. Secured With: Paper T ape, 2x10 (in/yd) Every Other Day/15 Days Discharge Instructions: Secure dressing with tape as directed. #1 I continue with the Santyl but change the covering dressing to [facility] Blue 2. He has completed his IV antibiotics and apparently is on trimethoprim sulfamethoxazole now. I will try to check infectious disease notes 3. The patient did not want a vascular consult I did not reapproach this today. This may be the additional factor that is keeping any form of healing from happening here 4. Follow-up in 2 weeks Electronic Signature(s) Signed: 02/19/2021 4:37:23 PM By: Baltazar Najjar MD Entered By: Baltazar Najjar on 02/19/2021 12:41:57 -------------------------------------------------------------------------------- SuperBill  Details Patient Name: Date of Service: [name], [name] 02/19/2021 Medical Record Number: [id number] Patient Account Number: [id number] Date of Birth/Sex: Treating RN: 07/22/1931 (87 y.o. [name] Primary Care Provider: [name] Other Clinician: Referring Provider: Treating Provider/Extender: Allen Norris in Treatment: 6 Diagnosis Coding ICD-10 Codes Code Description [id number] Pressure ulcer of left heel, unstageable M86.172 Other acute osteomyelitis, left ankle and foot B95.62 Methicillin resistant Staphylococcus aureus infection as the cause of diseases classified elsewhere Facility Procedures CPT4 Code: [id number] Description: 11042 - DEB SUBQ TISSUE 20 SQ CM/< ICD-10 Diagnosis Description L89.620 Pressure ulcer of left heel, unstageable Modifier: Quantity: 1 CPT4 Code: [id number] Description: 11045 - DEB SUBQ TISS EA ADDL 20CM ICD-10 Diagnosis Description L89.620 Pressure ulcer of left heel, unstageable Modifier: Quantity: 1 Physician Procedures : CPT4 Code Description Modifier [id number] 11042 - WC PHYS SUBQ TISS 20 SQ CM ICD-10 Diagnosis Description L89.620 Pressure ulcer of left heel, unstageable Quantity: 1 : [id number] 11045 - WC PHYS SUBQ TISS EA ADDL 20 CM ICD-10 Diagnosis Description L89.620 Pressure ulcer of left heel, unstageable Quantity: 1 Electronic Signature(s) Signed: 02/19/2021 4:37:23 PM By: Baltazar Najjar MD Entered By: Baltazar Najjar on 02/19/2021 12:42:23

## 2021-02-20 ENCOUNTER — Ambulatory Visit (HOSPITAL_COMMUNITY)
Admission: RE | Admit: 2021-02-20 | Discharge: 2021-02-20 | Disposition: A | Discharge status: Home / Self Care | Payer: Medicare Other | Source: Ambulatory Visit | Attending: Internal Medicine | Admitting: Internal Medicine

## 2021-02-20 DIAGNOSIS — I358 Other nonrheumatic aortic valve disorders: Secondary | ICD-10-CM | POA: Insufficient documentation

## 2021-02-20 DIAGNOSIS — I509 Heart failure, unspecified: Secondary | ICD-10-CM | POA: Diagnosis present

## 2021-02-20 DIAGNOSIS — Z8249 Family history of ischemic heart disease and other diseases of the circulatory system: Secondary | ICD-10-CM | POA: Diagnosis not present

## 2021-02-20 DIAGNOSIS — I5031 Acute diastolic (congestive) heart failure: Secondary | ICD-10-CM | POA: Diagnosis present

## 2021-02-20 DIAGNOSIS — I3481 Nonrheumatic mitral (valve) annulus calcification: Secondary | ICD-10-CM | POA: Insufficient documentation

## 2021-02-21 LAB — CBC AND DIFFERENTIAL
HCT: 33 — AB (ref 41–53)
Hemoglobin: 10.5 — AB (ref 13.5–17.5)
Neutrophils Absolute: 4310
Platelets: 307 (ref 150–399)
WBC: 6.5

## 2021-02-21 LAB — COMPREHENSIVE METABOLIC PANEL: Calcium: 8.8 (ref 8.7–10.7)

## 2021-02-21 LAB — BASIC METABOLIC PANEL
BUN: 47 — AB (ref 4–21)
CO2: 28 — AB (ref 13–22)
Chloride: 102 (ref 99–108)
Creatinine: 2.1 — AB (ref 0.6–1.3)
Glucose: 85
Potassium: 5.2 (ref 3.4–5.3)
Sodium: 139 (ref 137–147)

## 2021-02-21 LAB — CBC: RBC: 3.89 (ref 3.87–5.11)

## 2021-02-21 NOTE — Progress Notes (Signed)
Greg [name] ([id number]) , Visit Report for 02/19/2021 Arrival Information Details Patient Name: Date of Service: Greg [name] [facility] 02/19/2021 10:30 A M Medical Record Number: [id number] Patient Account Number: [id number] Date of Birth/Sex: Treating RN: 05-29-1931 (87 y.o. M) Primary Care Greg [name]: Greg [name] Other Clinician: Referring Greg [name]: Treating Greg [name]/Extender: Greg [name] in Treatment: 6 Visit Information History Since Last Visit Added or deleted any medications: No Patient Arrived: Wheel Chair Any new allergies or adverse reactions: No Arrival Time: 11:02 Had a fall or experienced change in Yes Accompanied By: wife activities of daily living that may affect Transfer Assistance: EasyPivot Patient Lift risk of falls: Patient Identification Verified: Yes Signs or symptoms of abuse/neglect since last visito No Secondary Verification Process Completed: Yes Hospitalized since last visit: No Patient Requires Transmission-Based Precautions: No Implantable device outside of the clinic excluding No Patient Has Alerts: Yes cellular tissue based products placed in the center Patient Alerts: Patient on Blood Thinner since last visit: L ABI non compressible Has Dressing in Place as Prescribed: Yes Pain Present Now: No Notes Patient states he tried to get up in the dark and sit in his chair but he missed the chair. Patient states he did not get hurt and he was able to get up on his own. Electronic Signature(s) Signed: 02/19/2021 1:54:26 PM By: Greg [name] Entered By: Greg [name] on 02/19/2021 11:18:31 -------------------------------------------------------------------------------- Encounter Discharge Information Details Patient Name: Date of Service: Greg [name], Greg [name] 02/19/2021 10:30 A M Medical Record Number: [id number] Patient Account Number: [id number] Date of Birth/Sex: Treating RN: Feb 18, 1932 (87 y.o. Marcheta Grammes Primary Care  Greg [name]: Greg [name] Other Clinician: Referring Greg [name]: Treating Greg [name]/Extender: Greg [name] in Treatment: 6 Encounter Discharge Information Items Post Procedure Vitals Discharge Condition: Stable Temperature (F): 98.0 Ambulatory Status: Wheelchair Pulse (bpm): 84 Discharge Destination: [place] Respiratory Rate (breaths/min): 16 Orders Sent: Yes Blood Pressure (mmHg): 129/74 Transportation: Other Accompanied By: wife Schedule Follow-up Appointment: Yes Clinical Summary of Care: Provided on 02/19/2021 Form Type Recipient Paper Patient Patient Electronic Signature(s) Signed: 02/19/2021 12:50:31 PM By: Greg [name] Entered By: Greg [name] on 02/19/2021 12:50:31 -------------------------------------------------------------------------------- Lower Extremity Assessment Details Patient Name: Date of Service: Greg [name] [name] 02/19/2021 10:30 A M Medical Record Number: [id number] Patient Account Number: [id number] Date of Birth/Sex: Treating RN: [birth year] (87 y.o. [name] Primary Care Greg [name]: Greg [name] Other Clinician: Referring Pearlean Sabina: Treating Brandice Busser/Extender: Greg [name] in Treatment: 6 Edema Assessment Assessed: [name]: No] [name]: No] Edema: [Left: Ye] [Right: s] Calf Left: Right: Point of Measurement: 36 cm From Medial Instep 44 cm Ankle Left: Right: Point of Measurement: 11 cm From Medial Instep 29 cm Vascular Assessment Pulses: Dorsalis Pedis Palpable: [Left:Yes] Electronic Signature(s) Signed: 02/21/2021 5:23:58 PM By: Greg [name] Entered By: Greg [name] on 02/19/2021 11:19:05 -------------------------------------------------------------------------------- Multi Wound Chart Details Patient Name: Date of Service: Greg [name], Greg [name] 02/19/2021 10:30 A M Medical Record Number: [id number] Patient Account Number: [id number] Date of Birth/Sex: Treating  RN: 10-01-1931 (87 y.o. M) Primary Care Greg [name]: Greg [name] Other Clinician: Referring Silus Lanzo: Treating Aryaa Bunting/Extender: Greg [name] in Treatment: 6 Vital Signs Height(in): 73 Pulse(bpm): 55 Weight(lbs): 180 Blood Pressure(mmHg): 129/74 Body Mass Index(BMI): 24 Temperature(F): 98.0 Respiratory Rate(breaths/min): 16 Photos: [1:Left Calcaneus] [N/A:N/A N/A] Wound Location: [1:Blister] [N/A:N/A] Wounding Event: [1:Pressure Ulcer] [N/A:N/A] Primary Etiology: [1:Hypertension, Osteoarthritis] [N/A:N/A] Comorbid History: [1:10/24/2020] [N/A:N/A] Date Acquired: [1:6] [N/A:N/A] Weeks of Treatment: [1:Open] [N/A:N/A] Wound Status: [1:5x6x0.8] [N/A:N/A] Measurements L  x W x D (cm) [1:23.562] [N/A:N/A] A (cm) : rea [1:18.85] [N/A:N/A] Volume (cm) : [1:50.00%] [N/A:N/A] % Reduction in A rea: [1:-300.00%] [N/A:N/A] % Reduction in Volume: [1:Unstageable/Unclassified] [N/A:N/A] Classification: [1:Large] [N/A:N/A] Exudate A mount: [1:Purulent] [N/A:N/A] Exudate Type: [1:yellow, brown, green] [N/A:N/A] Exudate Color: [1:Distinct, outline attached] [N/A:N/A] Wound Margin: [1:Small (1-33%)] [N/A:N/A] Granulation A mount: [1:Pink, Pale] [N/A:N/A] Granulation Quality: [1:Large (67-100%)] [N/A:N/A] Necrotic A mount: [1:Eschar, Adherent Slough] [N/A:N/A] Necrotic Tissue: [1:Fat Layer (Subcutaneous Tissue): Yes N/A] Exposed Structures: [1:Fascia: No Tendon: No Muscle: No Joint: No Bone: No Small (1-33%)] [N/A:N/A] Epithelialization: [1:Debridement - Excisional] [N/A:N/A] Debridement: Pre-procedure Verification/Time Out 11:34 [N/A:N/A] Taken: [1:Necrotic/Eschar, Subcutaneous,] [N/A:N/A] Tissue Debrided: [1:Slough Skin/Subcutaneous Tissue] [N/A:N/A] Level: [1:30] [N/A:N/A] Debridement A (sq cm): [1:rea Forceps, Scissors] [N/A:N/A] Instrument: [1:Minimum] [N/A:N/A] Bleeding: [1:Pressure] [N/A:N/A] Hemostasis Achieved: Debridement Treatment Response: Procedure  was tolerated well [N/A:N/A] Post Debridement Measurements L x 5x6x0.8 [N/A:N/A] W x D (cm) [1:18.85] [N/A:N/A] Post Debridement Volume: (cm) [1:Unstageable/Unclassified] [N/A:N/A] Post Debridement Stage: [1:Debridement] [N/A:N/A] Treatment Notes Electronic Signature(s) Signed: 02/19/2021 4:37:23 PM By: Linton Ham MD Entered By: Linton Ham on 02/19/2021 12:37:16 -------------------------------------------------------------------------------- Multi-Disciplinary Care Plan Details Patient Name: Date of Service: Greg [name], Greg [name] 02/19/2021 10:30 A M Medical Record Number: [id number] Patient Account Number: [id number] Date of Birth/Sex: Treating RN: January 13, 1932 (87 y.o. Marcheta Grammes Primary Care Deatra Mcmahen: Greg [name] Other Clinician: Referring Paxon Propes: Treating Nicloe Frontera/Extender: Greg [name] in Treatment: 6 Multidisciplinary Care Plan reviewed with physician Active Inactive Tissue Oxygenation Nursing Diagnoses: Actual ineffective tissue perfusion; peripheral (select once diagnosis is confirmed) Goals: Non-invasive arterial studies are completed as ordered Date Initiated: 01/22/2021 Target Resolution Date: 03/12/2021 Goal Status: Active Interventions: Assess patient understanding of disease process and management upon diagnosis and as needed Assess peripheral arterial status upon admission and as needed Provide education on tissue oxygenation and ischemia Treatment Activities: Ankle Brachial Index (ABI) : 01/22/2021 Non-invasive vascular studies : 01/22/2021 Notes: Wound/Skin Impairment Nursing Diagnoses: Impaired tissue integrity Knowledge deficit related to ulceration/compromised skin integrity Goals: Patient/caregiver will verbalize understanding of skin care regimen Date Initiated: 01/04/2021 Date Inactivated: 02/05/2021 Target Resolution Date: 02/01/2021 Goal Status: Met Ulcer/skin breakdown will have a volume reduction of 30% by  week 4 Date Initiated: 02/05/2021 Target Resolution Date: 03/12/2021 Goal Status: Active Interventions: Assess patient/caregiver ability to obtain necessary supplies Assess patient/caregiver ability to perform ulcer/skin care regimen upon admission and as needed Assess ulceration(s) every visit Provide education on ulcer and skin care Notes: 02/05/21: Wound not yet at 30% volume reduction, patient has infection and receiving IV antibiotics. Electronic Signature(s) Signed: 02/19/2021 2:27:53 PM By: Greg [name] Entered By: Greg [name] on 02/19/2021 14:27:52 -------------------------------------------------------------------------------- Pain Assessment Details Patient Name: Date of Service: Greg [name] [name] 02/19/2021 10:30 A M Medical Record Number: [id number] Patient Account Number: [id number] Date of Birth/Sex: Treating RN: [birth year] (87 y.o. M) Primary Care Charlestine Rookstool: Greg [name] Other Clinician: Referring Naftula Donahue: Treating Danaysia Rader/Extender: Greg [name] in Treatment: 6 Active Problems Location of Pain Severity and Description of Pain Patient Has Paino No Site Locations Pain Management and Medication Current Pain Management: Electronic Signature(s) Signed: 02/19/2021 1:54:26 PM By: Greg [name] Entered By: Greg [name] on 02/19/2021 11:04:03 -------------------------------------------------------------------------------- Patient/Caregiver Education Details Patient Name: Date of Service: Greg [name], Greg [name] 11/15/2022andnbsp10:30 A M Medical Record Number: [id number] Patient Account Number: [id number] Date of Birth/Gender: Treating RN: 1932/01/19 (87 y.o. Marcheta Grammes Primary Care Physician: Greg [name] Other Clinician: Referring Physician: Treating Physician/Extender: Greg [name] in Treatment: 6 Education Assessment  Education Provided To: Patient and Caregiver Education Topics  Provided Offloading: Methods: Explain/Verbal, Printed Responses: State content correctly Wound/Skin Impairment: Methods: Explain/Verbal, Printed Responses: State content correctly Electronic Signature(s) Signed: 02/19/2021 4:38:08 PM By: Greg [name] Entered By: Greg [name] on 02/19/2021 11:45:42 -------------------------------------------------------------------------------- Wound Assessment Details Patient Name: Date of Service: Greg [name], Greg [name] 02/19/2021 10:30 A M Medical Record Number: [id number] Patient Account Number: [id number] Date of Birth/Sex: Treating RN: Jun 20, 1931 (87 y.o. M) Primary Care Haely Leyland: Greg [name] Other Clinician: Referring Adyn Serna: Treating Patrik Turnbaugh/Extender: Carrie Mew, Ihor Gully in Treatment: 6 Wound Status Wound Number: 1 Primary Etiology: Pressure Ulcer Wound Location: Left Calcaneus Wound Status: Open Wounding Event: Blister Comorbid History: Hypertension, Osteoarthritis Date Acquired: 10/24/2020 Weeks Of Treatment: 6 Clustered Wound: No Photos Wound Measurements Length: (cm) 5 Width: (cm) 6 Depth: (cm) 0.8 Area: (cm) 23.562 Volume: (cm) 18.85 % Reduction in Area: 50% % Reduction in Volume: -300% Epithelialization: Small (1-33%) Tunneling: No Undermining: No Wound Description Classification: Unstageable/Unclassified Wound Margin: Distinct, outline attached Exudate Amount: Large Exudate Type: Purulent Exudate Color: yellow, brown, green Foul Odor After Cleansing: No Slough/Fibrino Yes Wound Bed Granulation Amount: Small (1-33%) Exposed Structure Granulation Quality: Pink, Pale Fascia Exposed: No Necrotic Amount: Large (67-100%) Fat Layer (Subcutaneous Tissue) Exposed: Yes Necrotic Quality: Eschar, Adherent Slough Tendon Exposed: No Muscle Exposed: No Joint Exposed: No Bone Exposed: No Treatment Notes Wound #1 (Calcaneus) Wound Laterality: Left Cleanser Wound Cleanser Discharge Instruction:  Cleanse the wound with wound cleanser prior to applying a clean dressing using gauze sponges, not tissue or cotton balls. Peri-Wound Care Topical Primary Dressing Hydrofera Blue Ready Foam, 2.5 x2.5 in Discharge Instruction: Apply to wound bed as instructed Santyl Ointment Discharge Instruction: Apply nickel thick amount to wound bed, under silver alginate Secondary Dressing Woven Gauze Sponge, Non-Sterile 4x4 in Discharge Instruction: Apply over primary dressing as directed. ALLEVYN Heel 4 1/2in x 5 1/2in / 10.5cm x 13.5cm Discharge Instruction: Apply over primary dressing as directed. Secured With [employer], 4.5x3.1 (in/yd) Discharge Instruction: Secure with Kerlix as directed. Paper Tape, 2x10 (in/yd) Discharge Instruction: Secure dressing with tape as directed. Compression Wrap Compression Stockings Add-Ons Electronic Signature(s) Signed: 02/21/2021 5:23:58 PM By: Greg [name] Entered By: Greg [name] on 02/19/2021 11:22:57 -------------------------------------------------------------------------------- Vitals Details Patient Name: Date of Service: Greg [name], Greg [name] 02/19/2021 10:30 A M Medical Record Number: [id number] Patient Account Number: [id number] Date of Birth/Sex: Treating RN: 1931-07-17 (87 y.o. M) Primary Care Anner Baity: Greg [name] Other Clinician: Referring Raynaldo Falco: Treating Mataeo Ingwersen/Extender: Greg [name] in Treatment: 6 Vital Signs Time Taken: 11:03 Temperature (F): 98.0 Height (in): 73 Pulse (bpm): 84 Weight (lbs): 180 Respiratory Rate (breaths/min): 16 Body Mass Index (BMI): 23.7 Blood Pressure (mmHg): 129/74 Reference Range: 80 - 120 mg / dl Electronic Signature(s) Signed: 02/19/2021 1:54:26 PM By: Greg [name] Entered By: Greg [name] on 02/19/2021 11:03:55

## 2021-02-22 ENCOUNTER — Encounter: Payer: Self-pay | Admitting: Nurse Practitioner

## 2021-02-22 ENCOUNTER — Non-Acute Institutional Stay (SKILLED_NURSING_FACILITY): Payer: Medicare Other | Admitting: Nurse Practitioner

## 2021-02-22 DIAGNOSIS — K5901 Slow transit constipation: Secondary | ICD-10-CM

## 2021-02-22 DIAGNOSIS — M86172 Other acute osteomyelitis, left ankle and foot: Secondary | ICD-10-CM

## 2021-02-22 DIAGNOSIS — I5031 Acute diastolic (congestive) heart failure: Secondary | ICD-10-CM

## 2021-02-22 DIAGNOSIS — N1832 Chronic kidney disease, stage 3b: Secondary | ICD-10-CM | POA: Diagnosis not present

## 2021-02-22 DIAGNOSIS — E785 Hyperlipidemia, unspecified: Secondary | ICD-10-CM

## 2021-02-22 DIAGNOSIS — N401 Enlarged prostate with lower urinary tract symptoms: Secondary | ICD-10-CM

## 2021-02-22 DIAGNOSIS — R350 Frequency of micturition: Secondary | ICD-10-CM

## 2021-02-22 DIAGNOSIS — R7303 Prediabetes: Secondary | ICD-10-CM

## 2021-02-22 DIAGNOSIS — D500 Iron deficiency anemia secondary to blood loss (chronic): Secondary | ICD-10-CM

## 2021-02-22 DIAGNOSIS — I509 Heart failure, unspecified: Secondary | ICD-10-CM

## 2021-02-22 DIAGNOSIS — I82411 Acute embolism and thrombosis of right femoral vein: Secondary | ICD-10-CM | POA: Diagnosis not present

## 2021-02-22 DIAGNOSIS — G20 Parkinson's disease: Secondary | ICD-10-CM

## 2021-02-22 DIAGNOSIS — I10 Essential (primary) hypertension: Secondary | ICD-10-CM

## 2021-02-22 DIAGNOSIS — M810 Age-related osteoporosis without current pathological fracture: Secondary | ICD-10-CM

## 2021-02-22 DIAGNOSIS — M159 Polyosteoarthritis, unspecified: Secondary | ICD-10-CM

## 2021-02-22 NOTE — Assessment & Plan Note (Signed)
ambulates with walker prior to the right hip fx.10/10/20 s/p R ORIF right hip

## 2021-02-22 NOTE — Progress Notes (Addendum)
Location:   SNF Corn Room Number: 27A Place of Service:  SNF (31) Provider: [facility] Lylee Corrow NP  Virgie Dad, MD  Patient Care Team: Virgie Dad, MD as PCP - General (Internal Medicine) [name], Eustace Quail, DO as Consulting Physician (Neurology)  Extended Emergency Contact Information Primary Emergency Contact: Vergia Alcon Address: [place].          apt [phone] Johnnette Litter of [place] Phone: [phone] Mobile Phone: [phone] Relation: Spouse Secondary Emergency Contact: ociel, retherford Mobile Phone: [phone] Relation: Son  Code Status:  DNR Goals of care: Advanced Directive information Advanced Directives 02/22/2021  Does Patient Have a Medical Advance Directive? Yes  Type of Advance Directive Living will  Does patient want to make changes to medical advance directive? No - Patient declined  Would patient like information on creating a medical advance directive? -     Chief Complaint  Patient presents with   Acute Visit    Acute visit for Chronic Kidney Disease     HPI:  Pt is a 87 y.o. male seen today for an acute visit for elevated Bun/creat 47/2.08 eGFR 02/21/21  Anemia, Hgb 10.5 02/21/21,  no apparent s/s of bleeding             Left calcaneous osteomyelitis, wound care center for debride,  delaying BKA decision. On Septra DS, Santyl and Silver Alginate for wound.              DVT 12/21/20 R femoral vein, on Eliquis.  CHF/Edema BLE, L 1-2+ >R trace to 1+, decreased Torsemide to 11m qd since 02/18/21. Pending echocardiogram result.  CKD, elevated Bun/creat 47/2.08 eGFR 30 02/21/22             Prediabetes, diet control, Hgb a1c 5.9             Parkinson's disease, diagnosed 2019, f/u Neurology, taking Sinemet.             Urinary frequency, chronic, BPH             OA multiple sites, ambulates with walker prior to the right hip fx.10/10/20 s/p R ORIF right hip             OP, took Fosamax x 6 years, stopped 2018, on  Ca, Vit D             Hyperlipidemia, not taking statin             HTN, off Carvedilol             Constipation, takes MiraLax, Senna.               Past Medical History:  Diagnosis Date   Acute bronchiolitis    Acute osteomyelitis of calcaneum, left ([facility] 01/11/2021   Anemia    Bursitis of both hips    Hearing reduced    hearing aid   History of basal cell carcinoma    Hyperlipidemia    Malaise and fatigue    MRSA infection 01/11/2021   Osteoarthritis    hand   Osteoporosis    Palpitation    Vitamin D deficiency    Past Surgical History:  Procedure Laterality Date   CATARACT EXTRACTION  05/31/2015   Eye/right   COLONOSCOPY  02/05/2005, 03/06/2005   normal, 10 years ago   MOHS micrographic surgery     Face surgery   ORIF ACETABULAR FRACTURE Right 10/09/2020   Procedure: OPEN REDUCTION INTERNAL FIXATION (  ORIF) ACETABULAR FRACTURE;  Surgeon: Altamese Olivet, MD;  Location: [facility];  Service: Orthopedics;  Laterality: Right;    No Known Allergies  Allergies as of 02/22/2021   No Known Allergies      Medication List        Accurate as of February 22, 2021 11:59 PM. If you have any questions, ask your nurse or doctor.          Acetaminophen 325 MG Caps Take 2 capsules by mouth every 6 (six) hours as needed.   albuterol 108 (90 Base) MCG/ACT inhaler Commonly known as: VENTOLIN HFA Inhale 2 puffs into the lungs every 4 (four) hours as needed for wheezing or shortness of breath.   apixaban 5 MG Tabs tablet Commonly known as: ELIQUIS Take 1 tablet (5 mg total) by mouth 2 (two) times daily.   CALCIUM-VITAMIN D3 PO Take 600 mg by mouth in the morning.   carbidopa-levodopa 25-100 MG tablet Commonly known as: SINEMET IR Take 1 tablet by mouth 4 (four) times daily.   carbidopa-levodopa 50-200 MG tablet Commonly known as: SINEMET CR Take 1 tablet by mouth at bedtime.   collagenase ointment Commonly known as: SANTYL Apply 1 application topically daily.    feeding supplement (PRO-STAT SUGAR FREE 64) Liqd Take 30 mLs by mouth in the morning and at bedtime.   polyethylene glycol 17 g packet Commonly known as: MiraLax Take 17 g by mouth daily.   potassium chloride SA 20 MEQ tablet Commonly known as: KLOR-CON M Take 20 mEq by mouth 2 (two) times daily.   senna 8.6 MG Tabs tablet Commonly known as: SENOKOT Take 1 tablet (8.6 mg total) by mouth daily.   sulfamethoxazole-trimethoprim 800-160 MG tablet Commonly known as: BACTRIM DS Take 1 tablet by mouth 2 (two) times daily.   torsemide 20 MG tablet Commonly known as: DEMADEX Take 20 mg by mouth daily. Give demadex 2 tablets (59m) PO daily   torsemide 20 MG tablet Commonly known as: DEMADEX Take 1 tablet (20 mg total) by mouth daily for 2 days.   Vitamin D (Cholecalciferol) 25 MCG (1000 UT) Tabs Take 25 mcg by mouth daily.   zinc oxide 20 % ointment Apply 1 application topically as needed for irritation.        Review of Systems  Constitutional:  Negative for activity change, appetite change and fever.  HENT:  Negative for congestion and trouble swallowing.   Eyes:  Negative for visual disturbance.  Respiratory:  Negative for shortness of breath.   Cardiovascular:  Positive for leg swelling. Negative for chest pain.  Gastrointestinal:  Negative for abdominal pain and constipation.  Genitourinary:  Positive for frequency. Negative for difficulty urinating and urgency.       1-2x/night nocturnal urination.   Musculoskeletal:  Positive for arthralgias and gait problem.  Skin:  Positive for wound.       Left heel.   Neurological:  Positive for tremors. Negative for speech difficulty, weakness and light-headedness.  Psychiatric/Behavioral:  Positive for sleep disturbance. Negative for confusion. The patient is not nervous/anxious.        Not sleeping well, but declined sleeping aid.    Immunization History  Administered Date(s) Administered   H1N1 06/13/2008   Influenza  Split 03/05/2006, 02/26/2007, 01/26/2008, 01/18/2009, 12/12/2009, 01/16/2011, 01/29/2012, 02/01/2013, 02/08/2014, 02/13/2015, 01/10/2016, 02/23/2017, 02/24/2018, 01/24/2019, 12/22/2019   Influenza, High Dose Seasonal PF 01/30/2021   PFIZER(Purple Top)SARS-COV-2 Vaccination 05/04/2019, 05/25/2019, 09/01/2020   Pfizer Covid-19 Vaccine Bivalent Booster 116yr& up 12/27/2019, 12/26/2020  Pneumococcal Conjugate-13 02/10/2014   Pneumococcal Polysaccharide-23 06/10/2011   Zoster, Live 07/04/2009   Pertinent  Health Maintenance Due  Topic Date Due   INFLUENZA VACCINE  Completed   Fall Risk 10/11/2020 10/11/2020 10/11/2020 10/12/2020 01/10/2021  Falls in the past year? - - - - 1  Was there an injury with Fall? - - - - 1  Fall Risk Category Calculator - - - - 2  Fall Risk Category - - - - Moderate  Patient Fall Risk Level High fall risk High fall risk High fall risk High fall risk High fall risk   Functional Status Survey:    Vitals:   02/22/21 1530  BP: (!) 85/50  Pulse: 77  Resp: 18  Temp: 97.7 F (36.5 C)  SpO2: 96%  Weight: 176 lb 11.2 oz (80.2 kg)  Height: '5\' 9"'  (1.753 m)   Body mass index is 26.09 kg/m. Physical Exam Vitals reviewed.  Constitutional:      Appearance: Normal appearance.  HENT:     Head: Normocephalic and atraumatic.     Nose: Nose normal.     Mouth/Throat:     Mouth: Mucous membranes are moist.  Eyes:     Extraocular Movements: Extraocular movements intact.     Conjunctiva/sclera: Conjunctivae normal.     Pupils: Pupils are equal, round, and reactive to light.  Cardiovascular:     Rate and Rhythm: Normal rate and regular rhythm.     Heart sounds: Murmur heard.  Pulmonary:     Effort: Pulmonary effort is normal.     Breath sounds: No rales.  Abdominal:     General: Bowel sounds are normal.     Palpations: Abdomen is soft.     Tenderness: There is no abdominal tenderness.  Musculoskeletal:     Cervical back: Normal range of motion and neck supple.      Right lower leg: Edema present.     Left lower leg: Edema present.     Comments: edema BLE L 1-2+>R trace to 1+  Skin:    General: Skin is warm and dry.     Comments: Left heel DTI, covered in dressing during today's examination.   Neurological:     General: No focal deficit present.     Mental Status: He is alert and oriented to person, place, and time. Mental status is at baseline.     Motor: No weakness.     Gait: Gait abnormal.     Comments: Mild overall stiffness   Psychiatric:        Mood and Affect: Mood normal.        Behavior: Behavior normal.        Thought Content: Thought content normal.        Judgment: Judgment normal.    Labs reviewed: Recent Labs    10/09/20 0546 10/09/20 0937 10/10/20 0607 10/11/20 0037 10/12/20 0146 10/25/20 0000 02/07/21 0000 02/12/21 0000 02/14/21 0000 02/21/21 0000  NA  --    < > 131* 129* 132*   < > 134* 136* 139 139  K  --    < > 4.2 4.5 4.1   < > 4.5 4.4 4.3 5.2  CL  --    < > 97* 96* 97*   < > 100 100 102 102  CO2  --    < > '28 29 28   ' < > 27* 27* 31* 28*  GLUCOSE  --    < > 134* 114* 122*  --   --   --   --   --  BUN  --    < > 23 26* 24*   < > 26* 32* 37* 47*  CREATININE  --    < > 1.20 1.14 1.14   < > 1.0 1.2 1.1 2.1*  CALCIUM  --    < > 8.7* 8.6* 8.4*   < > 8.3*  --  8.2* 8.8  MG  --   --  2.1  --   --   --   --   --   --   --   PHOS 3.4  --   --   --  3.5  --   --   --   --   --    < > = values in this interval not displayed.   Recent Labs    10/09/20 0937 10/11/20 0037 10/12/20 0146 12/26/20 0000 12/27/20 0000  AST 22 34  --  19 18  ALT 7 <5  --  9* 6*  ALKPHOS 31* 24*  --  55 46  BILITOT 1.7* 1.1  --   --   --   PROT 5.9* 4.6*  --   --   --   ALBUMIN 3.4* 2.6* 2.6* 3.7 2.9*   Recent Labs    10/10/20 0607 10/11/20 0037 10/12/20 0146 10/25/20 0000 01/15/21 0000 02/12/21 0000 02/14/21 0000 02/21/21 0000  WBC 13.8* 10.1 8.5   < > 7.0 7.1 7.5 6.5  NEUTROABS  --   --  6.4   < > 4,515.00  --  5,123.00  4,310.00  HGB 8.7* 7.4* 7.8*   < > 10.3* 9.5* 9.6* 10.5*  HCT 25.3* 21.8* 22.8*   < > 33* 31* 31* 33*  MCV 96.2 96.9 96.6  --   --   --   --   --   PLT 93* 90* 116*   < > 267 351 337 307   < > = values in this interval not displayed.   No results found for: TSH Lab Results  Component Value Date   HGBA1C 5.9 (H) 10/08/2020   No results found for: CHOL, HDL, LDLCALC, LDLDIRECT, TRIG, CHOLHDL  Significant Diagnostic Results in last 30 days:  ECHOCARDIOGRAM COMPLETE  Result Date: 02/25/2021    ECHOCARDIOGRAM REPORT   Patient Name:   DOREAN DANIELLO Date of Exam: 02/20/2021 Medical Rec #:  [id number]     Height:       69.0 in Accession #:    [id number]    Weight:       186.6 lb Date of Birth:  [birth year]     BSA:          2.006 m Patient Age:    55 years      BP:           110/64 mmHg Patient Gender: M             HR:           82 bpm. Exam Location:  Outpatient Procedure: 2D Echo, Cardiac Doppler and Color Doppler Indications:    CHF  History:        Patient has no prior history of Echocardiogram examinations.                 Risk Factors:Family History of Coronary Artery Disease.  Sonographer:    Helmut Muster Referring Phys: [place] Comments: Image acquisition challenging due to patient body habitus. IMPRESSIONS  1. Left ventricular ejection fraction, by estimation, is 60 to 65%.  The left ventricle has normal function. The left ventricle has no regional wall motion abnormalities. There is mild asymmetric left ventricular hypertrophy of the basal and septal segments. Left ventricular diastolic parameters are consistent with Grade I diastolic dysfunction (impaired relaxation).  2. Right ventricular systolic function is normal. The right ventricular size is normal.  3. The mitral valve is degenerative. No evidence of mitral valve regurgitation. No evidence of mitral stenosis. Moderate mitral annular calcification.  4. The aortic valve is tricuspid. There is moderate calcification  of the aortic valve. There is moderate thickening of the aortic valve. Aortic valve regurgitation is not visualized. Sclerosis without stenosis.  5. The inferior vena cava is normal in size with greater than 50% respiratory variability, suggesting right atrial pressure of 3 mmHg. FINDINGS  Left Ventricle: Left ventricular ejection fraction, by estimation, is 60 to 65%. The left ventricle has normal function. The left ventricle has no regional wall motion abnormalities. The left ventricular internal cavity size was normal in size. There is  mild asymmetric left ventricular hypertrophy of the basal and septal segments. Left ventricular diastolic parameters are consistent with Grade I diastolic dysfunction (impaired relaxation). Right Ventricle: The right ventricular size is normal. No increase in right ventricular wall thickness. Right ventricular systolic function is normal. Left Atrium: Left atrial size was normal in size. Right Atrium: Right atrial size was normal in size. Pericardium: There is no evidence of pericardial effusion. Mitral Valve: The mitral valve is degenerative in appearance. There is moderate thickening of the mitral valve leaflet(s). There is moderate calcification of the mitral valve leaflet(s). Moderate mitral annular calcification. No evidence of mitral valve regurgitation. No evidence of mitral valve stenosis. MV peak gradient, 13.5 mmHg. The mean mitral valve gradient is 5.0 mmHg. Tricuspid Valve: The tricuspid valve is normal in structure. Tricuspid valve regurgitation is not demonstrated. No evidence of tricuspid stenosis. Aortic Valve: The aortic valve is tricuspid. There is moderate calcification of the aortic valve. There is moderate thickening of the aortic valve. Aortic valve regurgitation is not visualized. Sclerosis without stenosis. Aortic valve mean gradient measures 5.0 mmHg. Aortic valve peak gradient measures 9.0 mmHg. Aortic valve area, by VTI measures 3.34 cm. Pulmonic Valve:  The pulmonic valve was normal in structure. Pulmonic valve regurgitation is not visualized. No evidence of pulmonic stenosis. Aorta: The aortic root is normal in size and structure. Venous: The inferior vena cava is normal in size with greater than 50% respiratory variability, suggesting right atrial pressure of 3 mmHg. IAS/Shunts: The interatrial septum was not well visualized.  LEFT VENTRICLE PLAX 2D LVIDd:         4.60 cm     Diastology LVIDs:         3.10 cm     LV e' medial:    6.09 cm/s LV PW:         1.10 cm     LV E/e' medial:  17.4 LV IVS:        1.10 cm     LV e' lateral:   10.10 cm/s LVOT diam:     2.10 cm     LV E/e' lateral: 10.5 LV SV:         106 LV SV Index:   53 LVOT Area:     3.46 cm  LV Volumes (MOD) LV vol d, MOD A2C: 62.8 ml LV vol d, MOD A4C: 84.9 ml LV vol s, MOD A2C: 34.5 ml LV vol s, MOD A4C: 36.7 ml LV SV MOD  A2C:     28.3 ml LV SV MOD A4C:     84.9 ml LV SV MOD BP:      41.5 ml RIGHT VENTRICLE             IVC RV S prime:     16.20 cm/s  IVC diam: 1.10 cm TAPSE (M-mode): 2.3 cm LEFT ATRIUM           Index        RIGHT ATRIUM           Index LA diam:      3.40 cm 1.69 cm/m   RA Area:     15.60 cm LA Vol (A2C): 37.2 ml 18.54 ml/m  RA Volume:   39.70 ml  19.79 ml/m LA Vol (A4C): 73.2 ml 36.49 ml/m  AORTIC VALVE AV Area (Vmax):    3.39 cm AV Area (Vmean):   3.33 cm AV Area (VTI):     3.34 cm AV Vmax:           150.33 cm/s AV Vmean:          106.000 cm/s AV VTI:            0.316 m AV Peak Grad:      9.0 mmHg AV Mean Grad:      5.0 mmHg LVOT Vmax:         147.00 cm/s LVOT Vmean:        102.000 cm/s LVOT VTI:          0.305 m LVOT/AV VTI ratio: 0.97  AORTA Ao Root diam: 3.70 cm Ao Asc diam:  3.20 cm MITRAL VALVE                TRICUSPID VALVE MV Area (PHT): 3.31 cm     TR Peak grad:   30.5 mmHg MV Area VTI:   2.45 cm     TR Vmax:        276.00 cm/s MV Peak grad:  13.5 mmHg MV Mean grad:  5.0 mmHg     SHUNTS MV Vmax:       1.84 m/s     Systemic VTI:  0.30 m MV Vmean:      102.5 cm/s    Systemic Diam: 2.10 cm MV Decel Time: 229 msec MV E velocity: 106.00 cm/s MV A velocity: 175.00 cm/s MV E/A ratio:  0.61 Jenkins Rouge MD Electronically signed by Jenkins Rouge MD Signature Date/Time: 02/25/2021/8:25:58 AM    Final     Assessment/Plan CKD (chronic kidney disease) stage 3, GFR 30-59 ml/min (HCC) elevated Bun/creat 47/2.08 eGFR 02/21/21, Torsemide was decreased 02/18/21, Septra DS can contributory, will hold Torsemide 11/19, 11/20, repeat BMP 02/25/21.   Blood loss anemia Hgb 10.5 02/21/21,  no apparent s/s of bleeding. Pending anemia panel.   Acute osteomyelitis of left calcaneus (HCC) Left calcaneous osteomyelitis, wound care center for debride,  delaying BKA decision. On Septra DS, Santyl and Silver Alginate for wound.   DVT of deep femoral vein, right ([place]) 12/21/20 R femoral vein, on Eliquis.   (HFpEF) heart failure with preserved ejection fraction (HCC) BLE, L 1-2+ >R trace to 1+, decreased Torsemide to 49m qd since 02/18/21. Pending echocardiogram result.  01/31/21 CXR diffused bilateral interstitial infiltrate, small bilateral effusions L>R, CHF vs PNA. BNP 282 02/20/21 echocardiogram: EF 60-65%   Prediabetes diet control, Hgb a1c 5.9  [place]'s [place], f/u Neurology, taking Sinemet.  BPH (benign prostatic hyperplasia) chronic, BPH  Osteoarthritis, multiple sites  ambulates with walker prior to the right hip fx.10/10/20 s/p R ORIF right hip  Osteoporosis took Fosamax x 6 years, stopped 2018, on Ca, Vit D  Hyperlipidemia Not taking statin.   HTN (hypertension) Controlled blood pressure w/o meds.   Slow transit constipation Stable, continue MiraLax, Senna.     Family/ staff Communication: plan of care reviewed with the patient, the patient's wife, and charge nurse.   Labs/tests ordered:  BMP  Time spend 35 minutes.

## 2021-02-22 NOTE — Assessment & Plan Note (Signed)
chronic, BPH

## 2021-02-22 NOTE — Assessment & Plan Note (Signed)
Controlled blood pressure w/o meds.

## 2021-02-22 NOTE — Assessment & Plan Note (Signed)
diet control, Hgb a1c 5.9

## 2021-02-22 NOTE — Assessment & Plan Note (Signed)
Not taking statin.  

## 2021-02-22 NOTE — Assessment & Plan Note (Signed)
elevated Bun/creat 47/2.08 eGFR 02/21/21, Torsemide was decreased 02/18/21, Septra DS can contributory, will hold Torsemide 11/19, 11/20, repeat BMP 02/25/21.

## 2021-02-22 NOTE — Assessment & Plan Note (Addendum)
BLE, L 1-2+ >R trace to 1+, decreased Torsemide to 20mg  qd since 02/18/21. Pending echocardiogram result.  01/31/21 CXR diffused bilateral interstitial infiltrate, small bilateral effusions L>R, CHF vs PNA. BNP 282 02/20/21 echocardiogram: EF 60-65%

## 2021-02-22 NOTE — Assessment & Plan Note (Addendum)
Hgb 10.5 02/21/21,  no apparent s/s of bleeding. Pending anemia panel.

## 2021-02-22 NOTE — Assessment & Plan Note (Signed)
Stable, continue MiraLax, Senna.

## 2021-02-22 NOTE — Assessment & Plan Note (Signed)
diagnosed 2019, f/u Neurology, taking Sinemet.

## 2021-02-22 NOTE — Assessment & Plan Note (Signed)
took Fosamax x 6 years, stopped 2018, on Ca, Vit D

## 2021-02-22 NOTE — Assessment & Plan Note (Signed)
Left calcaneous osteomyelitis, wound care center for debride,  delaying BKA decision. On Septra DS, Santyl and Silver Alginate for wound.

## 2021-02-22 NOTE — Assessment & Plan Note (Signed)
12/21/20 R femoral vein, on Eliquis.

## 2021-02-25 LAB — ECHOCARDIOGRAM COMPLETE
AR max vel: 3.39 cm2
AV Area VTI: 3.34 cm2
AV Area mean vel: 3.33 cm2
AV Mean grad: 5 mmHg
AV Peak grad: 9 mmHg
Ao pk vel: 1.5 m/s
Area-P 1/2: 3.31 cm2
Calc EF: 53.3 %
MV VTI: 2.45 cm2
S' Lateral: 3.1 cm
Single Plane A2C EF: 45.1 %
Single Plane A4C EF: 56.8 %

## 2021-02-26 ENCOUNTER — Encounter: Payer: Self-pay | Admitting: Nurse Practitioner

## 2021-03-05 ENCOUNTER — Encounter: Payer: Self-pay | Admitting: Nurse Practitioner

## 2021-03-05 ENCOUNTER — Non-Acute Institutional Stay (SKILLED_NURSING_FACILITY): Payer: Medicare Other | Admitting: Nurse Practitioner

## 2021-03-05 DIAGNOSIS — K5901 Slow transit constipation: Secondary | ICD-10-CM

## 2021-03-05 DIAGNOSIS — M86172 Other acute osteomyelitis, left ankle and foot: Secondary | ICD-10-CM | POA: Diagnosis not present

## 2021-03-05 DIAGNOSIS — E538 Deficiency of other specified B group vitamins: Secondary | ICD-10-CM | POA: Insufficient documentation

## 2021-03-05 DIAGNOSIS — R7303 Prediabetes: Secondary | ICD-10-CM

## 2021-03-05 DIAGNOSIS — D500 Iron deficiency anemia secondary to blood loss (chronic): Secondary | ICD-10-CM | POA: Diagnosis not present

## 2021-03-05 DIAGNOSIS — M810 Age-related osteoporosis without current pathological fracture: Secondary | ICD-10-CM

## 2021-03-05 DIAGNOSIS — I10 Essential (primary) hypertension: Secondary | ICD-10-CM

## 2021-03-05 DIAGNOSIS — B3789 Other sites of candidiasis: Secondary | ICD-10-CM | POA: Insufficient documentation

## 2021-03-05 DIAGNOSIS — I82411 Acute embolism and thrombosis of right femoral vein: Secondary | ICD-10-CM | POA: Diagnosis not present

## 2021-03-05 DIAGNOSIS — R350 Frequency of micturition: Secondary | ICD-10-CM

## 2021-03-05 DIAGNOSIS — N401 Enlarged prostate with lower urinary tract symptoms: Secondary | ICD-10-CM

## 2021-03-05 DIAGNOSIS — N1832 Chronic kidney disease, stage 3b: Secondary | ICD-10-CM

## 2021-03-05 DIAGNOSIS — E785 Hyperlipidemia, unspecified: Secondary | ICD-10-CM

## 2021-03-05 DIAGNOSIS — I5031 Acute diastolic (congestive) heart failure: Secondary | ICD-10-CM

## 2021-03-05 DIAGNOSIS — M159 Polyosteoarthritis, unspecified: Secondary | ICD-10-CM

## 2021-03-05 DIAGNOSIS — G20 Parkinson's disease: Secondary | ICD-10-CM

## 2021-03-05 NOTE — Assessment & Plan Note (Signed)
Stable, takes MiraLax, Senna. 

## 2021-03-05 NOTE — Assessment & Plan Note (Signed)
diet control, Hgb a1c 5.9

## 2021-03-05 NOTE — Assessment & Plan Note (Signed)
Blood pressure is controlled,  takes Torsemide °           °

## 2021-03-05 NOTE — Assessment & Plan Note (Signed)
Urinary frequency, chronic, BPH

## 2021-03-05 NOTE — Assessment & Plan Note (Signed)
diagnosed 2019, f/u Neurology, taking Sinemet. 

## 2021-03-05 NOTE — Assessment & Plan Note (Signed)
In setting of moist, heat, antibiotics, will treat with Nystatin powder bid, triamcinolone 1% cream bid x 10 days.

## 2021-03-05 NOTE — Progress Notes (Signed)
Location:   S[facility] [place] Room Number: 60 Place of Service:  SNF (31) Provider: [facility] Laith Antonelli NP  Virgie Dad, MD  Patient Care Team: Virgie Dad, MD as PCP - General (Internal Medicine) [name], Eustace Quail, DO as Consulting Physician (Neurology)  Extended Emergency Contact Information Primary Emergency Contact: Vergia Alcon Address: [address] [place].          [phone] Johnnette Litter of [place] Phone: [phone] Mobile Phone: [phone] Relation: Spouse Secondary Emergency Contact: aedon, deason Mobile Phone: [phone] Relation: Son  Code Status:  DNR Goals of care: Advanced Directive information Advanced Directives 02/22/2021  Does Patient Have a Medical Advance Directive? Yes  Type of Advance Directive Living will  Does patient want to make changes to medical advance directive? No - Patient declined  Would patient like information on creating a medical advance directive? -     Chief Complaint  Patient presents with   Acute Visit    Lower iron, Vit B12, rash.     HPI:  Pt is a 87 y.o. male seen today for an acute visit for low iron, low Vit B12. Reported itching rash in groin areas.   Anemia, Hgb 9.8, iron 44 03/04/21. Add Fe 373m 3x/wk  Vit B12 deficiency, Vit B12 205 03/04/21, add Vit B12 10068m qd             Left calcaneous osteomyelitis, wound care center for debride,  delaying BKA decision. On Septra DS, Santyl and Silver Alginate for wound.              DVT 12/21/20 R femoral vein, on Eliquis.  CHF/Edema BLE, echo EF 60-65%02/20/21, taking Torsemide CKD, Bun/creat 48/1.89 eGFR 34 02/26/21             Prediabetes, diet control, Hgb a1c 5.9             Parkinson's disease, diagnosed 2019, f/u  Neurology, taking Sinemet.             Urinary frequency, chronic, BPH             OA multiple sites, ambulates with walker prior to the right hip fx.10/10/20 s/p R ORIF right hip             OP, took Fosamax x 6 years, stopped 2018,  on Ca, Vit D             Hyperlipidemia, not taking statin             HTN, off Carvedilol             Constipation, takes MiraLax, Senna.              Past Medical History:  Diagnosis Date   Acute bronchiolitis    Acute osteomyelitis of calcaneum, left ([facility]/10/2020   Anemia    Bursitis of both hips    Hearing reduced    hearing aid   History of basal cell carcinoma    Hyperlipidemia    Malaise and fatigue    MRSA infection 01/11/2021   Osteoarthritis    hand   Osteoporosis    Palpitation    Vitamin D deficiency    Past Surgical History:  Procedure Laterality Date   CATARACT EXTRACTION  05/31/2015   Eye/right   COLONOSCOPY  02/05/2005, 03/06/2005   normal, 10 years ago   MOHS micrographic surgery     Face surgery   ORIF ACETABULAR FRACTURE Right 10/09/2020  Procedure: OPEN REDUCTION INTERNAL FIXATION (ORIF) ACETABULAR FRACTURE;  Surgeon: Altamese Paris, MD;  Location: [place];  Service: Orthopedics;  Laterality: Right;    No Known Allergies  Allergies as of 03/05/2021   No Known Allergies      Medication List        Accurate as of March 05, 2021  3:05 PM. If you have any questions, ask your nurse or doctor.          Acetaminophen 325 MG Caps Take 2 capsules by mouth every 6 (six) hours as needed.   albuterol 108 (90 Base) MCG/ACT inhaler Commonly known as: VENTOLIN HFA Inhale 2 puffs into the lungs every 4 (four) hours as needed for wheezing or shortness of breath.   apixaban 5 MG Tabs tablet Commonly known as: ELIQUIS Take 1 tablet (5 mg total) by mouth 2 (two) times daily.   CALCIUM-VITAMIN D3 PO Take 600 mg by mouth in the morning.   carbidopa-levodopa 25-100 MG tablet Commonly known as: SINEMET IR Take 1 tablet by mouth 4 (four) times daily.   carbidopa-levodopa 50-200 MG tablet Commonly known as: SINEMET CR Take 1 tablet by mouth at bedtime.   collagenase ointment Commonly known as: SANTYL Apply 1 application topically daily.    feeding supplement (PRO-STAT SUGAR FREE 64) Liqd Take 30 mLs by mouth in the morning and at bedtime.   polyethylene glycol 17 g packet Commonly known as: MiraLax Take 17 g by mouth daily.   potassium chloride SA 20 MEQ tablet Commonly known as: KLOR-CON M Take 20 mEq by mouth 2 (two) times daily.   senna 8.6 MG Tabs tablet Commonly known as: SENOKOT Take 1 tablet (8.6 mg total) by mouth daily.   sulfamethoxazole-trimethoprim 800-160 MG tablet Commonly known as: BACTRIM DS Take 1 tablet by mouth 2 (two) times daily.   torsemide 20 MG tablet Commonly known as: DEMADEX Take 20 mg by mouth daily. Give demadex 2 tablets (44m) PO daily   torsemide 20 MG tablet Commonly known as: DEMADEX Take 1 tablet (20 mg total) by mouth daily for 2 days.   Vitamin D (Cholecalciferol) 25 MCG (1000 UT) Tabs Take 25 mcg by mouth daily.   zinc oxide 20 % ointment Apply 1 application topically as needed for irritation.        Review of Systems  Constitutional:  Negative for activity change, appetite change and fever.  HENT:  Negative for congestion and trouble swallowing.   Eyes:  Negative for visual disturbance.  Respiratory:  Negative for shortness of breath.   Cardiovascular:  Positive for leg swelling. Negative for chest pain.  Gastrointestinal:  Negative for abdominal pain and constipation.  Genitourinary:  Positive for frequency. Negative for difficulty urinating and urgency.       1-2x/night nocturnal urination.   Musculoskeletal:  Positive for arthralgias and gait problem.  Skin:  Positive for rash and wound.       Left heel.   Neurological:  Positive for tremors. Negative for speech difficulty, weakness and light-headedness.  Psychiatric/Behavioral:  Positive for sleep disturbance. Negative for confusion. The patient is not nervous/anxious.        Not sleeping well, but declined sleeping aid.    Immunization History  Administered Date(s) Administered   H1N1 06/13/2008    Influenza Split 03/05/2006, 02/26/2007, 01/26/2008, 01/18/2009, 12/12/2009, 01/16/2011, 01/29/2012, 02/01/2013, 02/08/2014, 02/13/2015, 01/10/2016, 02/23/2017, 02/24/2018, 01/24/2019, 12/22/2019   Influenza, High Dose Seasonal PF 01/30/2021   PFIZER(Purple Top)SARS-COV-2 Vaccination 05/04/2019, 05/25/2019, 09/01/2020   Pfizer Covid-19  Vaccine Bivalent Booster 72yr & up 12/27/2019, 12/26/2020   Pneumococcal Conjugate-13 02/10/2014   Pneumococcal Polysaccharide-23 06/10/2011   Zoster, Live 07/04/2009   Pertinent  Health Maintenance Due  Topic Date Due   INFLUENZA VACCINE  Completed   Fall Risk 10/11/2020 10/11/2020 10/11/2020 10/12/2020 01/10/2021  Falls in the past year? - - - - 1  Was there an injury with Fall? - - - - 1  Fall Risk Category Calculator - - - - 2  Fall Risk Category - - - - Moderate  Patient Fall Risk Level High fall risk High fall risk High fall risk High fall risk High fall risk   Functional Status Survey:    Vitals:   03/05/21 1055  BP: 120/74  Pulse: 82  Resp: 20  Temp: (!) 97.5 F (36.4 C)  SpO2: 95%  Weight: 176 lb (79.8 kg)   Body mass index is 25.99 kg/m. Physical Exam Vitals reviewed.  Constitutional:      Appearance: Normal appearance.  HENT:     Head: Normocephalic and atraumatic.     Nose: Nose normal.     Mouth/Throat:     Mouth: Mucous membranes are moist.  Eyes:     Extraocular Movements: Extraocular movements intact.     Conjunctiva/sclera: Conjunctivae normal.     Pupils: Pupils are equal, round, and reactive to light.  Cardiovascular:     Rate and Rhythm: Normal rate and regular rhythm.     Heart sounds: Murmur heard.  Pulmonary:     Effort: Pulmonary effort is normal.     Breath sounds: No rales.  Abdominal:     General: Bowel sounds are normal.     Palpations: Abdomen is soft.     Tenderness: There is no abdominal tenderness.  Musculoskeletal:     Cervical back: Normal range of motion and neck supple.     Right lower leg: Edema  present.     Left lower leg: Edema present.     Comments: edema BLE L 1-2+>R trace   Skin:    General: Skin is warm and dry.     Findings: Erythema and rash present.     Comments: Left heel DTI, covered in dressing during today's examination. Beefy redness in groins/inner thighs.   Neurological:     General: No focal deficit present.     Mental Status: He is alert and oriented to person, place, and time. Mental status is at baseline.     Motor: No weakness.     Gait: Gait abnormal.     Comments: Mild overall stiffness   Psychiatric:        Mood and Affect: Mood normal.        Behavior: Behavior normal.        Thought Content: Thought content normal.        Judgment: Judgment normal.    Labs reviewed: Recent Labs    10/09/20 0546 10/09/20 0937 10/10/20 0607 10/11/20 0037 10/12/20 0146 10/25/20 0000 02/07/21 0000 02/12/21 0000 02/14/21 0000 02/21/21 0000  NA  --    < > 131* 129* 132*   < > 134* 136* 139 139  K  --    < > 4.2 4.5 4.1   < > 4.5 4.4 4.3 5.2  CL  --    < > 97* 96* 97*   < > 100 100 102 102  CO2  --    < > '28 29 28   ' < > 27* 27* 31* 28*  GLUCOSE  --    < > 134* 114* 122*  --   --   --   --   --   BUN  --    < > 23 26* 24*   < > 26* 32* 37* 47*  CREATININE  --    < > 1.20 1.14 1.14   < > 1.0 1.2 1.1 2.1*  CALCIUM  --    < > 8.7* 8.6* 8.4*   < > 8.3*  --  8.2* 8.8  MG  --   --  2.1  --   --   --   --   --   --   --   PHOS 3.4  --   --   --  3.5  --   --   --   --   --    < > = values in this interval not displayed.   Recent Labs    10/09/20 0937 10/11/20 0037 10/12/20 0146 12/26/20 0000 12/27/20 0000  AST 22 34  --  19 18  ALT 7 <5  --  9* 6*  ALKPHOS 31* 24*  --  55 46  BILITOT 1.7* 1.1  --   --   --   PROT 5.9* 4.6*  --   --   --   ALBUMIN 3.4* 2.6* 2.6* 3.7 2.9*   Recent Labs    10/10/20 0607 10/11/20 0037 10/12/20 0146 10/25/20 0000 01/15/21 0000 02/12/21 0000 02/14/21 0000 02/21/21 0000  WBC 13.8* 10.1 8.5   < > 7.0 7.1 7.5 6.5   NEUTROABS  --   --  6.4   < > 4,515.00  --  5,123.00 4,310.00  HGB 8.7* 7.4* 7.8*   < > 10.3* 9.5* 9.6* 10.5*  HCT 25.3* 21.8* 22.8*   < > 33* 31* 31* 33*  MCV 96.2 96.9 96.6  --   --   --   --   --   PLT 93* 90* 116*   < > 267 351 337 307   < > = values in this interval not displayed.   No results found for: TSH Lab Results  Component Value Date   HGBA1C 5.9 (H) 10/08/2020   No results found for: CHOL, HDL, LDLCALC, LDLDIRECT, TRIG, CHOLHDL  Significant Diagnostic Results in last 30 days:  ECHOCARDIOGRAM COMPLETE  Result Date: 02/25/2021    ECHOCARDIOGRAM REPORT   Patient Name:   ANIKIN PROSSER Date of Exam: 02/20/2021 Medical Rec #:  [id number]     Height:       69.0 in Accession #:    [id number]    Weight:       186.6 lb Date of Birth:  12-27-1931     BSA:          2.006 m Patient Age:    62 years      BP:           110/64 mmHg Patient Gender: M             HR:           82 bpm. Exam Location:  Outpatient Procedure: 2D Echo, Cardiac Doppler and Color Doppler Indications:    CHF  History:        Patient has no prior history of Echocardiogram examinations.                 Risk Factors:Family History of Coronary Artery Disease.  Sonographer:    Helmut Muster Referring Phys: [id number]  ANJALI L GUPTA  Sonographer Comments: Image acquisition challenging due to patient body habitus. IMPRESSIONS  1. Left ventricular ejection fraction, by estimation, is 60 to 65%. The left ventricle has normal function. The left ventricle has no regional wall motion abnormalities. There is mild asymmetric left ventricular hypertrophy of the basal and septal segments. Left ventricular diastolic parameters are consistent with Grade I diastolic dysfunction (impaired relaxation).  2. Right ventricular systolic function is normal. The right ventricular size is normal.  3. The mitral valve is degenerative. No evidence of mitral valve regurgitation. No evidence of mitral stenosis. Moderate mitral annular calcification.  4. The  aortic valve is tricuspid. There is moderate calcification of the aortic valve. There is moderate thickening of the aortic valve. Aortic valve regurgitation is not visualized. Sclerosis without stenosis.  5. The inferior vena cava is normal in size with greater than 50% respiratory variability, suggesting right atrial pressure of 3 mmHg. FINDINGS  Left Ventricle: Left ventricular ejection fraction, by estimation, is 60 to 65%. The left ventricle has normal function. The left ventricle has no regional wall motion abnormalities. The left ventricular internal cavity size was normal in size. There is  mild asymmetric left ventricular hypertrophy of the basal and septal segments. Left ventricular diastolic parameters are consistent with Grade I diastolic dysfunction (impaired relaxation). Right Ventricle: The right ventricular size is normal. No increase in right ventricular wall thickness. Right ventricular systolic function is normal. Left Atrium: Left atrial size was normal in size. Right Atrium: Right atrial size was normal in size. Pericardium: There is no evidence of pericardial effusion. Mitral Valve: The mitral valve is degenerative in appearance. There is moderate thickening of the mitral valve leaflet(s). There is moderate calcification of the mitral valve leaflet(s). Moderate mitral annular calcification. No evidence of mitral valve regurgitation. No evidence of mitral valve stenosis. MV peak gradient, 13.5 mmHg. The mean mitral valve gradient is 5.0 mmHg. Tricuspid Valve: The tricuspid valve is normal in structure. Tricuspid valve regurgitation is not demonstrated. No evidence of tricuspid stenosis. Aortic Valve: The aortic valve is tricuspid. There is moderate calcification of the aortic valve. There is moderate thickening of the aortic valve. Aortic valve regurgitation is not visualized. Sclerosis without stenosis. Aortic valve mean gradient measures 5.0 mmHg. Aortic valve peak gradient measures 9.0 mmHg.  Aortic valve area, by VTI measures 3.34 cm. Pulmonic Valve: The pulmonic valve was normal in structure. Pulmonic valve regurgitation is not visualized. No evidence of pulmonic stenosis. Aorta: The aortic root is normal in size and structure. Venous: The inferior vena cava is normal in size with greater than 50% respiratory variability, suggesting right atrial pressure of 3 mmHg. IAS/Shunts: The interatrial septum was not well visualized.  LEFT VENTRICLE PLAX 2D LVIDd:         4.60 cm     Diastology LVIDs:         3.10 cm     LV e' medial:    6.09 cm/s LV PW:         1.10 cm     LV E/e' medial:  17.4 LV IVS:        1.10 cm     LV e' lateral:   10.10 cm/s LVOT diam:     2.10 cm     LV E/e' lateral: 10.5 LV SV:         106 LV SV Index:   53 LVOT Area:     3.46 cm  LV Volumes (MOD) LV vol d, MOD  A2C: 62.8 ml LV vol d, MOD A4C: 84.9 ml LV vol s, MOD A2C: 34.5 ml LV vol s, MOD A4C: 36.7 ml LV SV MOD A2C:     28.3 ml LV SV MOD A4C:     84.9 ml LV SV MOD BP:      41.5 ml RIGHT VENTRICLE             IVC RV S prime:     16.20 cm/s  IVC diam: 1.10 cm TAPSE (M-mode): 2.3 cm LEFT ATRIUM           Index        RIGHT ATRIUM           Index LA diam:      3.40 cm 1.69 cm/m   RA Area:     15.60 cm LA Vol (A2C): 37.2 ml 18.54 ml/m  RA Volume:   39.70 ml  19.79 ml/m LA Vol (A4C): 73.2 ml 36.49 ml/m  AORTIC VALVE AV Area (Vmax):    3.39 cm AV Area (Vmean):   3.33 cm AV Area (VTI):     3.34 cm AV Vmax:           150.33 cm/s AV Vmean:          106.000 cm/s AV VTI:            0.316 m AV Peak Grad:      9.0 mmHg AV Mean Grad:      5.0 mmHg LVOT Vmax:         147.00 cm/s LVOT Vmean:        102.000 cm/s LVOT VTI:          0.305 m LVOT/AV VTI ratio: 0.97  AORTA Ao Root diam: 3.70 cm Ao Asc diam:  3.20 cm MITRAL VALVE                TRICUSPID VALVE MV Area (PHT): 3.31 cm     TR Peak grad:   30.5 mmHg MV Area VTI:   2.45 cm     TR Vmax:        276.00 cm/s MV Peak grad:  13.5 mmHg MV Mean grad:  5.0 mmHg     SHUNTS MV Vmax:       1.84  m/s     Systemic VTI:  0.30 m MV Vmean:      102.5 cm/s   Systemic Diam: 2.10 cm MV Decel Time: 229 msec MV E velocity: 106.00 cm/s MV A velocity: 175.00 cm/s MV E/A ratio:  0.61 Jenkins Rouge MD Electronically signed by Jenkins Rouge MD Signature Date/Time: 02/25/2021/8:25:58 AM    Final     Assessment/Plan Blood loss anemia Hgb 9.8, iron 44 03/04/21. Add Fe 338m 3x/wk  Vitamin B12 deficiency Vit B12 205 03/04/21, add Vit B12 10011m qd  Acute osteomyelitis of left calcaneus (HCC) Left calcaneous osteomyelitis, wound care center for debride,  delaying BKA decision. On Septra DS, Santyl and Silver Alginate for wound.   DVT of deep femoral vein, right (HCC) DVT 12/21/20 R femoral vein, on Eliquis.   (HFpEF) heart failure with preserved ejection fraction (HCC) CHF/Edema BLE, echo EF 60-65% 02/20/21, taking Torsemide  CKD (chronic kidney disease) stage 3, GFR 30-59 ml/min (HCC) Bun/creat 48/1.89 eGFR 34 02/26/21  Prediabetes diet control, Hgb a1c 5.9  [place], f/u  Neurology, taking Sinemet.  BPH (benign prostatic hyperplasia) Urinary frequency, chronic, BPH  Osteoarthritis, multiple sites multiple sites, ambulates with walker prior  to the right hip fx.10/10/20 s/p R ORIF right hip  Osteoporosis  took Fosamax x 6 years, stopped 2018, on Ca, Vit D  Hyperlipidemia not taking statin  Slow transit constipation Stable, takes MiraLax, Senna.  HTN (hypertension) Blood pressure is controlled, takes Torsemide.   Candida rash of groin In setting of moist, heat, antibiotics, will treat with Nystatin powder bid, triamcinolone 1% cream bid x 10 days.     Family/ staff Communication: plan of care reviewed with the patient and charge nurse.   Labs/tests ordered:  none  Time spend 35 minutes.

## 2021-03-05 NOTE — Assessment & Plan Note (Signed)
not taking statin.  

## 2021-03-05 NOTE — Assessment & Plan Note (Signed)
took Fosamax x 6 years, stopped 2018, on Ca, Vit D

## 2021-03-05 NOTE — Assessment & Plan Note (Signed)
Left calcaneous osteomyelitis, wound care center for debride,  delaying BKA decision. On Septra DS, Santyl and Silver Alginate for wound.

## 2021-03-05 NOTE — Assessment & Plan Note (Signed)
multiple sites, ambulates with walker prior to the right hip fx.10/10/20 s/p R ORIF right hip

## 2021-03-05 NOTE — Assessment & Plan Note (Signed)
CHF/Edema BLE, echo EF 60-65% 02/20/21, taking Torsemide

## 2021-03-05 NOTE — Assessment & Plan Note (Signed)
Bun/creat 48/1.89 eGFR 34 02/26/21

## 2021-03-05 NOTE — Assessment & Plan Note (Signed)
Hgb 9.8, iron 44 03/04/21. Add Fe 325mg  3x/wk

## 2021-03-05 NOTE — Assessment & Plan Note (Signed)
Vit B12 205 03/04/21, add Vit B12 qd

## 2021-03-05 NOTE — Assessment & Plan Note (Signed)
DVT 12/21/20 R femoral vein, on Eliquis.

## 2021-03-12 ENCOUNTER — Encounter (HOSPITAL_BASED_OUTPATIENT_CLINIC_OR_DEPARTMENT_OTHER): Payer: Medicare Other | Attending: Internal Medicine | Admitting: Internal Medicine

## 2021-03-12 ENCOUNTER — Other Ambulatory Visit: Payer: Self-pay

## 2021-03-12 DIAGNOSIS — L89620 Pressure ulcer of left heel, unstageable: Secondary | ICD-10-CM | POA: Insufficient documentation

## 2021-03-12 DIAGNOSIS — B9562 Methicillin resistant Staphylococcus aureus infection as the cause of diseases classified elsewhere: Secondary | ICD-10-CM | POA: Diagnosis not present

## 2021-03-12 DIAGNOSIS — M86172 Other acute osteomyelitis, left ankle and foot: Secondary | ICD-10-CM | POA: Insufficient documentation

## 2021-03-12 DIAGNOSIS — G20 Parkinson's disease: Secondary | ICD-10-CM | POA: Diagnosis not present

## 2021-03-12 DIAGNOSIS — Z7901 Long term (current) use of anticoagulants: Secondary | ICD-10-CM | POA: Diagnosis not present

## 2021-03-12 DIAGNOSIS — Z86718 Personal history of other venous thrombosis and embolism: Secondary | ICD-10-CM | POA: Diagnosis not present

## 2021-03-12 NOTE — Progress Notes (Signed)
Greg [name] ([id number]) , Visit Report for 03/12/2021 Debridement Details Patient Name: Date of Service: Greg [name] [facility] 03/12/2021 10:30 A M Medical Record Number: [id number] Patient Account Number: [id number] Date of Birth/Sex: Treating RN: 08-Mar-1932 (87 y.o. M) Primary Care Provider: Veleta Miners Other Clinician: Referring Provider: Treating Provider/Extender: Carley Hammed in Treatment: 9 Debridement Performed for Assessment: Wound #1 Left Calcaneus Performed By: Physician Ricard Dillon., MD Debridement Type: Debridement Level of Consciousness (Pre-procedure): Awake and Alert Pre-procedure Verification/Time Out Yes - 11:25 Taken: Start Time: 11:26 Pain Control: Other : Benzocaine T Area Debrided (L x W): otal 3 (cm) x 4 (cm) = 12 (cm) Tissue and other material debrided: Non-Viable, Slough, Subcutaneous, Slough Level: Skin/Subcutaneous Tissue Debridement Description: Excisional Instrument: Curette, Forceps, Scissors Bleeding: Minimum Hemostasis Achieved: Pressure End Time: 11:35 Response to Treatment: Procedure was tolerated well Level of Consciousness (Post- Awake and Alert procedure): Post Debridement Measurements of Total Wound Length: (cm) 5.3 Stage: Unstageable/Unclassified Width: (cm) 6 Depth: (cm) 1.3 Volume: (cm) 32.468 Character of Wound/Ulcer Post Debridement: Stable Post Procedure Diagnosis Same as Pre-procedure Electronic Signature(s) Signed: 03/12/2021 4:21:48 PM By: Linton Ham MD Entered By: Linton Ham on 03/12/2021 11:55:20 -------------------------------------------------------------------------------- HPI Details Patient Name: Date of Service: Greg [name], [name] 03/12/2021 10:30 A M Medical Record Number: [id number] Patient Account Number: [id number] Date of Birth/Sex: Treating RN: Jun 26, 1931 (87 y.o. M) Primary Care Provider: Veleta Miners Other Clinician: Referring Provider: Treating Provider/Extender:  Carley Hammed in Treatment: 9 History of Present Illness HPI Description: ADMISSION 01/04/2021 Mr. Gallagher is a pleasant 87 year old woman who lives at [facility] in the skilled facility. He is accompanied by his wife. I did previously been notified by Dr. Lyndel Safe primary physician about this patient. He apparently fractured his right acetabulum in July. Sometime in the recovery of this developed a pressure ulcer on the right heel. When I was first contacted by Dr. Lyndel Safe this was described as a necrotic wound and indeed this is an accurate description. Unfortunately cultures of this have shown MRSA and after 1 negative x-ray a subsequent x-ray on 01/02/2021 showed changes of osteomyelitis. The patient was started on IV vancomycin at the facility and I believe is also on oral trimethoprim/sulfamethoxazole. They are using silver alginate which was done at my suggestion. Sedimentation rate and the said facility was 55 CRP P was also elevated at 73.7 compatible with his osteomyelitis Past medical history includes a right acetabular fracture, DVT in the right femoral vein also in September on Eliquis, fairly severe Parkinson's disease. He has had [facility] [place] in the skilled facility. ABI in our clinic was noncompressible on the left 10/11; patient presents for follow-up. He has no issues or complaints today. He saw Dr. Tommy Medal on 10/7. He is currently on IV vancomycin. He currently denies systemic signs of infection. 10/18 remains on IV vancomycin. Santyl and silver alginate to the wound bed. [facility] is at the [facility] [place]. His wound has made some minor progress there is not as much necrotic tissue and some viable tissue on the surface however there is still an odor. The patient had noninvasive arterial tests done at my suggestion. On the left his ABI was noncompressible his TBI was 0.37 with monophasic and biphasic waveforms. Arterial Doppler  showed monophasic waveforms at the ATA and PTA. However there was no focally hemodynamic significant stenosis identified. 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 Remains on IV vancomycin difficult to  get consistent answers but it seems like they were not interested in the vascular surgery consult at this time. The wound itself somewhat improved in terms of the amount of necrotic material but still a very difficult necrotic wound. There is exposed bone. We are using Santyl and silver alginate 11/15; he has completed her IV antibiotics and now is on trimethoprim sulfamethoxazole although I did not look over infectious disease notes. The wound is not really improved in size although the condition of the wound surface looks somewhat better there is still exposed bone. We have been using Santyl and silver alginate being [facility] at [facility] I think it is quite possible the patient has significant PAD however the patient and his wife were not interested in consideration of a vascular referral therefore I have not made this 12/6; 3-week follow-up at their request. The patient completed IV antibiotics and apparently is on Septra DS. He lives in the skilled area of [facility] [place]. We tried to switch him to [facility] last time apparently that is not on their formulary and he was "unaffordable" per the information we got from the facility. Therefore still on Santyl and silver alginate. They are apparently rigorously offloading the heel when in bed. His wife had some questions about walking with physical therapy. As long as this is not putting undue pressure on the Achilles part of his heel where the wound is this should not be a problem Electronic Signature(s) Signed: 03/12/2021 4:21:48 PM By: Linton Ham MD Entered By: Linton Ham on 03/12/2021 11:57:05 -------------------------------------------------------------------------------- Physical Exam Details Patient Name:  Date of Service: Greg [name], [name] 03/12/2021 10:30 A M Medical Record Number: [id number] Patient Account Number: [id number] Date of Birth/Sex: Treating RN: 15-Dec-1931 (87 y.o. M) Primary Care Provider: Veleta Miners Other Clinician: Referring Provider: Treating Provider/Extender: Carley Hammed in Treatment: 9 Constitutional Sitting or standing Blood Pressure is within target range for patient.. Pulse regular and within target range for patient.Marland Kitchen Respirations regular, non-labored and within target range.. Temperature is normal and within the target range for the patient.Marland Kitchen Appears in no distress. Notes Wound exam; left heel. I removed some necrotic material with pickups scissors and then a #5 curette. Hemostasis with direct pressure. Most of this is however clean I did not identify any exposed bone. No overt surrounding infection Electronic Signature(s) Signed: 03/12/2021 4:21:48 PM By: Linton Ham MD Entered By: Linton Ham on 03/12/2021 11:57:57 -------------------------------------------------------------------------------- Physician Orders Details Patient Name: Date of Service: Greg [name], [name] 03/12/2021 10:30 A M Medical Record Number: [id number] Patient Account Number: [id number] Date of Birth/Sex: Treating RN: 04-05-1932 (87 y.o. Marcheta Grammes Primary Care Provider: Veleta Miners Other Clinician: Referring Provider: Treating Provider/Extender: Carley Hammed in Treatment: 9 Verbal / Phone Orders: No Diagnosis Coding ICD-10 Coding Code Description L89.620 Pressure ulcer of left heel, unstageable M86.172 Other acute osteomyelitis, left ankle and foot B95.62 Methicillin resistant Staphylococcus aureus infection as the cause of diseases classified elsewhere Follow-up Appointments Return appointment in 3 weeks. - Dr. Dellia Nims Bathing/ Shower/ Hygiene May shower with protection but do not get wound dressing(s) wet. Edema  Control - Lymphedema / SCD / Other Elevate legs to the level of the heart or above for 30 minutes daily and/or when sitting, a frequency of: - throughout the day Exercise regularly - May walk with PT trying to avoid using shoes that apply pressure to heels. Off-Loading Turn and reposition every 2 hours Other: - Float heels off of bed/chair  with pillow under calves Wound Treatment Wound #1 - Calcaneus Wound Laterality: Left Cleanser: Wound Cleanser Every Other Day/30 Days Discharge Instructions: Cleanse the wound with wound cleanser prior to applying a clean dressing using gauze sponges, not tissue or cotton balls. Prim Dressing: Hydrofera Blue Ready Foam, 2.5 x2.5 in Every Other Day/30 Days ary Discharge Instructions: Preferred, may use Silver Alginate if cannot get HFB Prim Dressing: Santyl Ointment Every Other Day/30 Days ary Discharge Instructions: Apply nickel thick amount to wound bed, under silver alginate Secondary Dressing: Woven Gauze Sponge, Non-Sterile 4x4 in Every Other Day/30 Days Discharge Instructions: Apply over primary dressing as directed. Secondary Dressing: ALLEVYN Heel 4 1/2in x 5 1/2in / 10.5cm x 13.5cm Every Other Day/30 Days Discharge Instructions: Apply over primary dressing as directed. Secured With: [employer], 4.5x3.1 (in/yd) Every Other Day/30 Days Discharge Instructions: Secure with Kerlix as directed. Secured With: Paper Tape, 2x10 (in/yd) Every Other Day/30 Days Discharge Instructions: Secure dressing with tape as directed. Electronic Signature(s) Signed: 03/12/2021 3:40:48 PM By: Lorrin Jackson Signed: 03/12/2021 4:21:48 PM By: Linton Ham MD Entered By: Lorrin Jackson on 03/12/2021 12:53:59 -------------------------------------------------------------------------------- Problem List Details Patient Name: Date of Service: Greg [name], [name] 03/12/2021 10:30 A M Medical Record Number: [id number] Patient Account Number: [id number] Date of  Birth/Sex: Treating RN: 05/20/1931 (87 y.o. Marcheta Grammes Primary Care Provider: Veleta Miners Other Clinician: Referring Provider: Treating Provider/Extender: Carley Hammed in Treatment: 9 Active Problems ICD-10 Encounter Code Description Active Date MDM Diagnosis L89.620 Pressure ulcer of left heel, unstageable 01/04/2021 No Yes M86.172 Other acute osteomyelitis, left ankle and foot 01/04/2021 No Yes B95.62 Methicillin resistant Staphylococcus aureus infection as the cause of diseases 01/04/2021 No Yes classified elsewhere Inactive Problems Resolved Problems Electronic Signature(s) Signed: 03/12/2021 4:21:48 PM By: Linton Ham MD Previous Signature: 03/12/2021 11:20:17 AM Version By: Lorrin Jackson Entered By: Linton Ham on 03/12/2021 11:53:22 -------------------------------------------------------------------------------- Progress Note Details Patient Name: Date of Service: Greg [name], [name] 03/12/2021 10:30 A M Medical Record Number: [id number] Patient Account Number: [id number] Date of Birth/Sex: Treating RN: 09-Dec-1931 (87 y.o. M) Primary Care Provider: Veleta Miners Other Clinician: Referring Provider: Treating Provider/Extender: Carley Hammed in Treatment: 9 Subjective History of Present Illness (HPI) ADMISSION 01/04/2021 Mr. Kalal is a pleasant 87 year old woman who lives at [facility] in the skilled facility. He is accompanied by his wife. I did previously been notified by Dr. Lyndel Safe primary physician about this patient. He apparently fractured his right acetabulum in July. Sometime in the recovery of this developed a pressure ulcer on the right heel. When I was first contacted by Dr. Lyndel Safe this was described as a necrotic wound and indeed this is an accurate description. Unfortunately cultures of this have shown MRSA and after 1 negative x-ray a subsequent x-ray on 01/02/2021 showed changes of osteomyelitis.  The patient was started on IV vancomycin at the facility and I believe is also on oral trimethoprim/sulfamethoxazole. They are using silver alginate which was done at my suggestion. Sedimentation rate and the said facility was 55 CRP P was also elevated at 73.7 compatible with his osteomyelitis Past medical history includes a right acetabular fracture, DVT in the right femoral vein also in September on Eliquis, fairly severe Parkinson's disease. He has had [facility] [place] in the skilled facility. ABI in our clinic was noncompressible on the left 10/11; patient presents for follow-up. He has no issues or complaints today. He saw Dr. Tommy Medal on 10/7. He is  currently on IV vancomycin. He currently denies systemic signs of infection. 10/18 remains on IV vancomycin. Santyl and silver alginate to the wound bed. He is at the [facility] section [facility] [place]. His wound has made some minor progress there is not as much necrotic tissue and some viable tissue on the surface however there is still an odor. The patient had noninvasive arterial tests done at my suggestion. On the left his ABI was noncompressible his TBI was 0.37 with monophasic and biphasic waveforms. Arterial Doppler showed monophasic waveforms at the ATA and PTA. However there was no focally hemodynamic significant stenosis identified. 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 Remains on IV vancomycin difficult to get consistent answers but it seems like they were not interested in the vascular surgery consult at this time. The wound itself somewhat improved in terms of the amount of necrotic material but still a very difficult necrotic wound. There is exposed bone. We are using Santyl and silver alginate 11/15; he has completed her IV antibiotics and now is on trimethoprim sulfamethoxazole although I did not look over infectious disease notes. The wound is not really improved in size although the condition of the  wound surface looks somewhat better there is still exposed bone. We have been using Santyl and silver alginate [facility] at [facility] I think it is quite possible the patient has significant PAD however the patient and his wife were not interested in consideration of a vascular referral therefore I have not made this 12/6; 3-week follow-up at their request. The patient completed IV antibiotics and apparently is on Septra DS. He lives in the skilled area of [facility] [place]. We tried to switch him to [facility] last time apparently that is not on their formulary and he was "unaffordable" per the information we got from the facility. Therefore still on Santyl and silver alginate. They are apparently rigorously offloading the heel when in bed. His wife had some questions about walking with physical therapy. As long as this is not putting undue pressure on the Achilles part of his heel where the wound is this should not be a problem Objective Constitutional Sitting or standing Blood Pressure is within target range for patient.. Pulse regular and within target range for patient.Marland Kitchen Respirations regular, non-labored and within target range.. Temperature is normal and within the target range for the patient.Marland Kitchen Appears in no distress. Vitals Time Taken: 11:15 AM, Height: 73 in, Weight: 180 lbs, BMI: 23.7, Temperature: 98.4 F, Pulse: 80 bpm, Respiratory Rate: 16 breaths/min, Blood Pressure: 124/63 mmHg. General Notes: Wound exam; left heel. I removed some necrotic material with pickups scissors and then a #5 curette. Hemostasis with direct pressure. Most of this is however clean I did not identify any exposed bone. No overt surrounding infection Integumentary (Hair, Skin) Wound #1 status is Open. Original cause of wound was Blister. The date acquired was: 10/24/2020. The wound has been in treatment 9 weeks. The wound is located on the Left Calcaneus. The wound measures 5.3cm length x 6cm width x  1.3cm depth; 24.976cm^2 area and 32.468cm^3 volume. There is Fat Layer (Subcutaneous Tissue) exposed. There is no tunneling or undermining noted. There is a large amount of purulent drainage noted. The wound margin is distinct with the outline attached to the wound base. There is medium (34-66%) pink, pale granulation within the wound bed. There is a medium (34-66%) amount of necrotic tissue within the wound bed including Adherent Slough. Assessment Active Problems ICD-10  Pressure ulcer of left heel, unstageable Other acute osteomyelitis, left ankle and foot Methicillin resistant Staphylococcus aureus infection as the cause of diseases classified elsewhere Procedures Wound #1 Pre-procedure diagnosis of Wound #1 is a Pressure Ulcer located on the Left Calcaneus . There was a Excisional Skin/Subcutaneous Tissue Debridement with a total area of 12 sq cm performed by Ricard Dillon., MD. With the following instrument(s): Curette, Forceps, and Scissors to remove Non-Viable tissue/material. Material removed includes Subcutaneous Tissue and Slough and after achieving pain control using Other (Benzocaine). No specimens were taken. A time out was conducted at 11:25, prior to the start of the procedure. A Minimum amount of bleeding was controlled with Pressure. The procedure was tolerated well. Post Debridement Measurements: 5.3cm length x 6cm width x 1.3cm depth; 32.468cm^3 volume. Post debridement Stage noted as Unstageable/Unclassified. Character of Wound/Ulcer Post Debridement is stable. Post procedure Diagnosis Wound #1: Same as Pre-Procedure Plan Follow-up Appointments: Return appointment in 3 weeks. - Dr. Dellia Nims Bathing/ Shower/ Hygiene: May shower with protection but do not get wound dressing(s) wet. Edema Control - Lymphedema / SCD / Other: Elevate legs to the level of the heart or above for 30 minutes daily and/or when sitting, a frequency of: - throughout the day Exercise regularly -  May walk with PT trying to avoid using shoes that apply pressure to heels. Off-Loading: Turn and reposition every 2 hours Other: - Float heels off of bed/chair with pillow under calves WOUND #1: - Calcaneus Wound Laterality: Left Cleanser: Wound Cleanser Every Other Day/15 Days Discharge Instructions: Cleanse the wound with wound cleanser prior to applying a clean dressing using gauze sponges, not tissue or cotton balls. Prim Dressing: Hydrofera Blue Ready Foam, 2.5 x2.5 in Every Other Day/15 Days ary Discharge Instructions: Preferred, may use Silver Alginate if cannot get HFB Prim Dressing: Santyl Ointment Every Other Day/15 Days ary Discharge Instructions: Apply nickel thick amount to wound bed, under silver alginate Secondary Dressing: Woven Gauze Sponge, Non-Sterile 4x4 in Every Other Day/15 Days Discharge Instructions: Apply over primary dressing as directed. Secondary Dressing: ALLEVYN Heel 4 1/2in x 5 1/2in / 10.5cm x 13.5cm Every Other Day/15 Days Discharge Instructions: Apply over primary dressing as directed. Secured With: [employer], 4.5x3.1 (in/yd) Every Other Day/15 Days Discharge Instructions: Secure with Kerlix as directed. Secured With: Paper T ape, 2x10 (in/yd) Every Other Day/15 Days Discharge Instructions: Secure dressing with tape as directed. #1 still using Santyl and silver alginate 2. I am not really sure what my alternatives are in the formulary at [facility] [place] skilled unit. They said that Hydrofera Blue was unaffordable although his wife certainly did not seem to know anything about this 3. Original staff aureus infection seems to have resolved with antibiotic treatment. I am not sure what he is taking currently. He completed IV antibiotics 4. They were not interested in an aggressive arterial evaluation. Physical exam suggests absence of the pulses on the left foot but something palpable in the popliteal. He does not seem to be in any pain  however. Electronic Signature(s) Signed: 03/12/2021 4:21:48 PM By: Linton Ham MD Entered By: Linton Ham on 03/12/2021 11:59:42 -------------------------------------------------------------------------------- SuperBill Details Patient Name: Date of Service: Greg [name], [name] 03/12/2021 Medical Record Number: [id number] Patient Account Number: [id number] Date of Birth/Sex: Treating RN: [birth year] (87 y.o. Marcheta Grammes Primary Care Provider: Veleta Miners Other Clinician: Referring Provider: Treating Provider/Extender: Carley Hammed in Treatment: 9 Diagnosis Coding ICD-10 Codes Code Description [phone] Pressure ulcer of  left heel, unstageable M86.172 Other acute osteomyelitis, left ankle and foot B95.62 Methicillin resistant Staphylococcus aureus infection as the cause of diseases classified elsewhere Facility Procedures CPT4 Code: [id number] Description: 11042 - DEB SUBQ TISSUE 20 SQ CM/< ICD-10 Diagnosis Description L89.620 Pressure ulcer of left heel, unstageable Modifier: Quantity: 1 Physician Procedures : CPT4 Code Description Modifier [id number] 11042 - WC PHYS SUBQ TISS 20 SQ CM ICD-10 Diagnosis Description L89.620 Pressure ulcer of left heel, unstageable Quantity: 1 Electronic Signature(s) Signed: 03/12/2021 4:21:48 PM By: Baltazar Najjar MD Entered By: Baltazar Najjar on 03/12/2021 11:59:52

## 2021-03-12 NOTE — Progress Notes (Signed)
Greg [name] ([id number]) , Visit Report for 03/12/2021 Arrival Information Details Patient Name: Date of Service: Greg [name] [facility] 03/12/2021 10:30 A M Medical Record Number: [id number] Patient Account Number: [id number] Date of Birth/Sex: Treating RN: 1931/08/12 (87 y.o. M) Primary Care Greg [name]: Veleta Miners Other Clinician: Referring Greg [name]: Treating Diamantina Edinger/Extender: Carley Hammed in Treatment: 9 Visit Information History Since Last Visit Added or deleted any medications: No Patient Arrived: Wheel Chair Any new allergies or adverse reactions: No Arrival Time: 11:14 Had a fall or experienced change in No Accompanied By: wife activities of daily living that may affect Transfer Assistance: EasyPivot Patient Lift risk of falls: Patient Identification Verified: Yes Signs or symptoms of abuse/neglect since last visito No Secondary Verification Process Completed: Yes Hospitalized since last visit: No Patient Requires Transmission-Based Precautions: No Implantable device outside of the clinic excluding No Patient Has Alerts: Yes cellular tissue based products placed in the center Patient Alerts: Patient on Blood Thinner since last visit: L ABI non compressible Has Dressing in Place as Prescribed: Yes Pain Present Now: No Electronic Signature(s) Signed: 03/12/2021 2:27:55 PM By: Sandre Kitty Entered By: Sandre Kitty on 03/12/2021 11:15:24 -------------------------------------------------------------------------------- Encounter Discharge Information Details Patient Name: Date of Service: Greg [name], Greg [name] 03/12/2021 10:30 A M Medical Record Number: [id number] Patient Account Number: [id number] Date of Birth/Sex: Treating RN: 22-Nov-1931 (87 y.o. Marcheta Grammes Primary Care Greg [name]: Veleta Miners Other Clinician: Referring Greg [name]: Treating Greg [name]/Extender: Carley Hammed in Treatment: 9 Encounter Discharge Information  Items Post Procedure Vitals Discharge Condition: Stable Temperature (F): 98.4 Ambulatory Status: Wheelchair Pulse (bpm): 80 Discharge Destination: Home Respiratory Rate (breaths/min): 16 Transportation: Other Blood Pressure (mmHg): 124/63 Accompanied By: Wife Schedule Follow-up Appointment: Yes Clinical Summary of Care: Provided on 03/12/2021 Form Type Recipient Paper Patient Patient Electronic Signature(s) Signed: 03/12/2021 3:40:48 PM By: Lorrin Jackson Entered By: Lorrin Jackson on 03/12/2021 11:38:46 -------------------------------------------------------------------------------- Lower Extremity Assessment Details Patient Name: Date of Service: Greg [name] [name] 03/12/2021 10:30 A M Medical Record Number: [id number] Patient Account Number: [id number] Date of Birth/Sex: Treating RN: Sep 29, 1931 (87 y.o. Marcheta Grammes Primary Care Greg [name]: Veleta Miners Other Clinician: Referring Greg [name]: Treating Greg [name]/Extender: Carley Hammed in Treatment: 9 Edema Assessment Assessed: [name]: Yes] Patrice [name]: No] Edema: [Left: Ye] [Right: s] Calf Left: Right: Point of Measurement: 36 cm From Medial Instep 43 cm Ankle Left: Right: Point of Measurement: 11 cm From Medial Instep 29 cm Vascular Assessment Pulses: Dorsalis Pedis Palpable: [Left:Yes] Electronic Signature(s) Signed: 03/12/2021 3:40:48 PM By: Lorrin Jackson Entered By: Lorrin Jackson on 03/12/2021 11:23:36 -------------------------------------------------------------------------------- Multi Wound Chart Details Patient Name: Date of Service: Greg [name], Greg [name] 03/12/2021 10:30 A M Medical Record Number: [id number] Patient Account Number: [id number] Date of Birth/Sex: Treating RN: [birth year] (87 y.o. M) Primary Care Greg [name]: Veleta Miners Other Clinician: Referring Greg [name]: Treating Tanisia Yokley/Extender: Carley Hammed in Treatment: 9 Vital Signs Height(in): 73 Pulse(bpm):  80 Weight(lbs): 180 Blood Pressure(mmHg): 124/63 Body Mass Index(BMI): 24 Temperature(F): 98.4 Respiratory Rate(breaths/min): 16 Photos: [1:Left Calcaneus] [N/A:N/A N/A] Wound Location: [1:Blister] [N/A:N/A] Wounding Event: [1:Pressure Ulcer] [N/A:N/A] Primary Etiology: [1:Hypertension, Osteoarthritis] [N/A:N/A] Comorbid History: [1:10/24/2020] [N/A:N/A] Date Acquired: [1:9] [N/A:N/A] Weeks of Treatment: [1:Open] [N/A:N/A] Wound Status: [1:5.3x6x1.3] [N/A:N/A] Measurements L x W x D (cm) [1:24.976] [N/A:N/A] A (cm) : rea [1:32.468] [N/A:N/A] Volume (cm) : [1:47.00%] [N/A:N/A] % Reduction in A rea: [1:-589.00%] [N/A:N/A] % Reduction in Volume: [1:Unstageable/Unclassified] [N/A:N/A] Classification: [1:Large] [N/A:N/A] Exudate A mount: [1:Purulent] [N/A:N/A] Exudate Type: [1:yellow, brown, green] [N/A:N/A]  Exudate Color: [1:Distinct, outline attached] [N/A:N/A] Wound Margin: [1:Medium (34-66%)] [N/A:N/A] Granulation A mount: [1:Pink, Pale] [N/A:N/A] Granulation Quality: [1:Medium (34-66%)] [N/A:N/A] Necrotic A mount: [1:Fat Layer (Subcutaneous Tissue): Yes N/A] Exposed Structures: [1:Fascia: No Tendon: No Muscle: No Joint: No Bone: No Small (1-33%)] [N/A:N/A] Epithelialization: [1:Debridement - Excisional] [N/A:N/A] Debridement: Pre-procedure Verification/Time Out 11:25 [N/A:N/A] Taken: [1:Other] [N/A:N/A] Pain Control: [1:Subcutaneous, Slough] [N/A:N/A] Tissue Debrided: [1:Skin/Subcutaneous Tissue] [N/A:N/A] Level: [1:12] [N/A:N/A] Debridement A (sq cm): [1:rea Curette, Forceps, Scissors] [N/A:N/A] Instrument: [1:Minimum] [N/A:N/A] Bleeding: [1:Pressure] [N/A:N/A] Hemostasis A chieved: [1:Procedure was tolerated well] [N/A:N/A] Debridement Treatment Response: [1:5.3x6x1.3] [N/A:N/A] Post Debridement Measurements L x W x D (cm) [1:32.468] [N/A:N/A] Post Debridement Volume: (cm) [1:Unstageable/Unclassified] [N/A:N/A] Post Debridement Stage: [1:Debridement]  [N/A:N/A] Treatment Notes Wound #1 (Calcaneus) Wound Laterality: Left Cleanser Wound Cleanser Discharge Instruction: Cleanse the wound with wound cleanser prior to applying a clean dressing using gauze sponges, not tissue or cotton balls. Peri-Wound Care Topical Primary Dressing KerraCel Ag Gelling Fiber Dressing, 4x5 in (silver alginate) Discharge Instruction: Apply silver alginate to wound bed as instructed Santyl Ointment Discharge Instruction: Apply nickel thick amount to wound bed, under silver alginate Secondary Dressing Woven Gauze Sponge, Non-Sterile 4x4 in Discharge Instruction: Apply over primary dressing as directed. ALLEVYN Heel 4 1/2in x 5 1/2in / 10.5cm x 13.5cm Discharge Instruction: Apply over primary dressing as directed. Secured With [employer], 4.5x3.1 (in/yd) Discharge Instruction: Secure with Kerlix as directed. Paper Tape, 2x10 (in/yd) Discharge Instruction: Secure dressing with tape as directed. Compression Wrap Compression Stockings Add-Ons Electronic Signature(s) Signed: 03/12/2021 4:21:48 PM By: Linton Ham MD Entered By: Linton Ham on 03/12/2021 11:53:30 -------------------------------------------------------------------------------- Multi-Disciplinary Care Plan Details Patient Name: Date of Service: Greg [name], Greg [name] 03/12/2021 10:30 A M Medical Record Number: [id number] Patient Account Number: [id number] Date of Birth/Sex: Treating RN: Aug 18, 1931 (87 y.o. Marcheta Grammes Primary Care Ronold Hardgrove: Veleta Miners Other Clinician: Referring Denaisha Swango: Treating Masen Salvas/Extender: Carley Hammed in Treatment: [place] reviewed with physician Active Inactive Wound/Skin Impairment Nursing Diagnoses: Impaired tissue integrity Knowledge deficit related to ulceration/compromised skin integrity Goals: Patient/caregiver will verbalize understanding of skin care regimen Date Initiated:  01/04/2021 Date Inactivated: 02/05/2021 Target Resolution Date: 02/01/2021 Goal Status: Met Ulcer/skin breakdown will have a volume reduction of 30% by week 4 Date Initiated: 02/05/2021 Target Resolution Date: 03/12/2021 Goal Status: Active Interventions: Assess patient/caregiver ability to obtain necessary supplies Assess patient/caregiver ability to perform ulcer/skin care regimen upon admission and as needed Assess ulceration(s) every visit Provide education on ulcer and skin care Notes: 02/05/21: Wound not yet at 30% volume reduction, patient has infection and receiving IV antibiotics. Electronic Signature(s) Signed: 03/12/2021 11:21:10 AM By: Lorrin Jackson Entered By: Lorrin Jackson on 03/12/2021 11:21:10 -------------------------------------------------------------------------------- Pain Assessment Details Patient Name: Date of Service: Greg [name] [name] 03/12/2021 10:30 A M Medical Record Number: [id number] Patient Account Number: [id number] Date of Birth/Sex: Treating RN: 07/15/1931 (87 y.o. M) Primary Care Maycee Blasco: Veleta Miners Other Clinician: Referring Kaedynce Tapp: Treating Tytiana Coles/Extender: Carley Hammed in Treatment: 9 Active Problems Location of Pain Severity and Description of Pain Patient Has Paino No Site Locations Pain Management and Medication Current Pain Management: Electronic Signature(s) Signed: 03/12/2021 2:27:55 PM By: Sandre Kitty Entered By: Sandre Kitty on 03/12/2021 11:15:47 -------------------------------------------------------------------------------- Patient/Caregiver Education Details Patient Name: Date of Service: Greg [name], Greg [name] 12/6/2022andnbsp10:30 [place] Record Number: [id number] Patient Account Number: [id number] Date of Birth/Gender: Treating RN: Jun 02, 1931 (87 y.o. Marcheta Grammes Primary Care Physician: Veleta Miners Other Clinician: Referring  Physician: Treating Physician/Extender: Carley Hammed in Treatment: 9 Education Assessment Education Provided To: Patient Education Topics Provided Offloading: Methods: Explain/Verbal, Printed Responses: State content correctly Wound/Skin Impairment: Methods: Explain/Verbal, Printed Responses: State content correctly Electronic Signature(s) Signed: 03/12/2021 3:40:48 PM By: Lorrin Jackson Entered By: Lorrin Jackson on 03/12/2021 11:21:35 -------------------------------------------------------------------------------- Wound Assessment Details Patient Name: Date of Service: Greg [name], Greg [name] 03/12/2021 10:30 A M Medical Record Number: [id number] Patient Account Number: [id number] Date of Birth/Sex: Treating RN: [birth year] (87 y.o. M) Primary Care Merci Walthers: Veleta Miners Other Clinician: Referring Amberlea Spagnuolo: Treating Abdulrahim Siddiqi/Extender: Carley Hammed in Treatment: 9 Wound Status Wound Number: 1 Primary Etiology: Pressure Ulcer Wound Location: Left Calcaneus Wound Status: Open Wounding Event: Blister Comorbid History: Hypertension, Osteoarthritis Date Acquired: 10/24/2020 Weeks Of Treatment: 9 Clustered Wound: No Photos Wound Measurements Length: (cm) 5.3 Width: (cm) 6 Depth: (cm) 1.3 Area: (cm) 24.976 Volume: (cm) 32.468 % Reduction in Area: 47% % Reduction in Volume: -589% Epithelialization: Small (1-33%) Tunneling: No Undermining: No Wound Description Classification: Unstageable/Unclassified Wound Margin: Distinct, outline attached Exudate Amount: Large Exudate Type: Purulent Exudate Color: yellow, brown, green Foul Odor After Cleansing: No Slough/Fibrino Yes Wound Bed Granulation Amount: Medium (34-66%) Exposed Structure Granulation Quality: Pink, Pale Fascia Exposed: No Necrotic Amount: Medium (34-66%) Fat Layer (Subcutaneous Tissue) Exposed: Yes Necrotic Quality: Adherent Slough Tendon Exposed: No Muscle Exposed: No Joint Exposed: No Bone Exposed:  No Treatment Notes Wound #1 (Calcaneus) Wound Laterality: Left Cleanser Wound Cleanser Discharge Instruction: Cleanse the wound with wound cleanser prior to applying a clean dressing using gauze sponges, not tissue or cotton balls. Peri-Wound Care Topical Primary Dressing KerraCel Ag Gelling Fiber Dressing, 4x5 in (silver alginate) Discharge Instruction: Apply silver alginate to wound bed as instructed Santyl Ointment Discharge Instruction: Apply nickel thick amount to wound bed, under silver alginate Secondary Dressing Woven Gauze Sponge, Non-Sterile 4x4 in Discharge Instruction: Apply over primary dressing as directed. ALLEVYN Heel 4 1/2in x 5 1/2in / 10.5cm x 13.5cm Discharge Instruction: Apply over primary dressing as directed. Secured With [employer], 4.5x3.1 (in/yd) Discharge Instruction: Secure with Kerlix as directed. Paper Tape, 2x10 (in/yd) Discharge Instruction: Secure dressing with tape as directed. Compression Wrap Compression Stockings Add-Ons Electronic Signature(s) Signed: 03/12/2021 3:40:48 PM By: Lorrin Jackson Entered By: Lorrin Jackson on 03/12/2021 11:24:04 -------------------------------------------------------------------------------- Vitals Details Patient Name: Date of Service: Greg [name], Greg [name] 03/12/2021 10:30 A M Medical Record Number: [id number] Patient Account Number: [id number] Date of Birth/Sex: Treating RN: June 02, 1931 (87 y.o. M) Primary Care Jsean Taussig: Veleta Miners Other Clinician: Referring Fermin Yan: Treating Katelynd Blauvelt/Extender: Carley Hammed in Treatment: 9 Vital Signs Time Taken: 11:15 Temperature (F): 98.4 Height (in): 73 Pulse (bpm): 80 Weight (lbs): 180 Respiratory Rate (breaths/min): 16 Body Mass Index (BMI): 23.7 Blood Pressure (mmHg): 124/63 Reference Range: 80 - 120 mg / dl Electronic Signature(s) Signed: 03/12/2021 2:27:55 PM By: Sandre Kitty Entered By: Sandre Kitty on 03/12/2021  11:15:40

## 2021-03-22 ENCOUNTER — Ambulatory Visit (INDEPENDENT_AMBULATORY_CARE_PROVIDER_SITE_OTHER): Payer: Medicare Other | Admitting: Infectious Disease

## 2021-03-22 ENCOUNTER — Other Ambulatory Visit: Payer: Self-pay

## 2021-03-22 VITALS — BP 90/57 | HR 80 | Resp 16 | Ht 69.0 in | Wt 176.0 lb

## 2021-03-22 DIAGNOSIS — M86172 Other acute osteomyelitis, left ankle and foot: Secondary | ICD-10-CM

## 2021-03-22 DIAGNOSIS — N1832 Chronic kidney disease, stage 3b: Secondary | ICD-10-CM

## 2021-03-22 DIAGNOSIS — A4902 Methicillin resistant Staphylococcus aureus infection, unspecified site: Secondary | ICD-10-CM

## 2021-03-22 DIAGNOSIS — G20 Parkinson's disease: Secondary | ICD-10-CM

## 2021-03-22 DIAGNOSIS — S72001K Fracture of unspecified part of neck of right femur, subsequent encounter for closed fracture with nonunion: Secondary | ICD-10-CM | POA: Diagnosis not present

## 2021-03-22 DIAGNOSIS — I5031 Acute diastolic (congestive) heart failure: Secondary | ICD-10-CM

## 2021-03-22 DIAGNOSIS — I82411 Acute embolism and thrombosis of right femoral vein: Secondary | ICD-10-CM | POA: Diagnosis not present

## 2021-03-22 NOTE — Progress Notes (Signed)
Subjective:  Chief complaint: Recent episode of shaking yesterday   Patient ID: Greg [name], male    DOB: 1931-06-04, 87 y.o.   MRN: [id number]  HPI  Greg [name] is an 87 year old Caucasian man with a past medical history significant for parkinsonism, chronic kidney disease, hypertension recent fall and hip fracture on the right status post ORIF by Dr. Marcelino Scot.  He then developed a pressure ulcer apparently on his left heel that has worsened despite wound care at his skilled nursing facility, now with referral to wound care and being followed by Dr. Dellia Nims.  Ultimately a culture was taken from the wound although I do not have access to it which grew methicillin-resistant Staph aureus.  The patient had been on doxycycline and switched to Bactrim making me presume that it was doxycycline resistant.  He has now on intravenous vancomycin.  He was  referred to our clinic for further evaluation and management.  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  He was not psychologically ready for that at that time and still remains not ready for below the knee amputation.  He and his wife do understand that there is a risk of this infection spreading from his calcaneus into the bloodstream and causing sepsis which could hospitalize him could end his life, could lead to spread of infection to other organs.  I was able to get the antimicrobial sensitivity data faxed to [place] and is listed below:     I have placed him on Bactrim double strength tablet twice daily and he is remained on this.  He continues to follow-up with Dr. Dellia Nims.    Past Medical History:  Diagnosis Date   Acute bronchiolitis    Acute osteomyelitis of calcaneum, left ([place]) 01/11/2021   Anemia    Bursitis of both hips    Hearing reduced    hearing aid   History of basal  cell carcinoma    Hyperlipidemia    Malaise and fatigue    MRSA infection 01/11/2021   Osteoarthritis    hand   Osteoporosis    Palpitation    Vitamin D deficiency     Past Surgical History:  Procedure Laterality Date   CATARACT EXTRACTION  05/31/2015   Eye/right   COLONOSCOPY  02/05/2005, 03/06/2005   normal, 10 years ago   MOHS micrographic surgery     Face surgery   ORIF ACETABULAR FRACTURE Right 10/09/2020   Procedure: OPEN REDUCTION INTERNAL FIXATION (ORIF) ACETABULAR FRACTURE;  Surgeon: Altamese , MD;  Location: [facility];  Service: Orthopedics;  Laterality: Right;    Family History  Problem Relation Age of Onset   Prostate cancer Father    Skin cancer Brother       Social History   Socioeconomic History   Marital status: Married    Spouse name: [name]   Number of children: 2   Years of education: Not on file   Highest education level: Not on file  Occupational History   Occupation: retired    Comment: art professor  Tobacco Use   Smoking status: Never   Smokeless tobacco: Never  Vaping Use   Vaping Use: Never used  Substance and Sexual Activity   Alcohol use: Never   Drug use: Never   Sexual activity: Not on file  Other Topics Concern   Not  on file  Social History Narrative   Pt just move from [place] to [place]   Pt leaving with wife   Social Determinants of Health   Financial Resource Strain: Not on file  Food Insecurity: Not on file  Transportation Needs: Not on file  Physical Activity: Not on file  Stress: Not on file  Social Connections: Not on file    No Known Allergies   Current Outpatient Medications:    Acetaminophen 325 MG CAPS, Take 2 capsules by mouth every 6 (six) hours as needed., Disp: , Rfl:    albuterol (VENTOLIN HFA) 108 (90 Base) MCG/ACT inhaler, Inhale 2 puffs into the lungs every 4 (four) hours as needed for wheezing or shortness of breath., Disp: , Rfl:    Amino Acids-Protein Hydrolys (FEEDING SUPPLEMENT, PRO-STAT  SUGAR FREE 64,) LIQD, Take 30 mLs by mouth in the morning and at bedtime., Disp: , Rfl:    apixaban (ELIQUIS) 5 MG TABS tablet, Take 1 tablet (5 mg total) by mouth 2 (two) times daily., Disp: 60 tablet, Rfl: 3   Calcium Carbonate-Vitamin D (CALCIUM-VITAMIN D3 PO), Take 600 mg by mouth in the morning., Disp: , Rfl:    carbidopa-levodopa (SINEMET CR) 50-200 MG tablet, Take 1 tablet by mouth at bedtime., Disp: 90 tablet, Rfl: 1   carbidopa-levodopa (SINEMET IR) 25-100 MG tablet, Take 1 tablet by mouth 4 (four) times daily., Disp: 360 tablet, Rfl: 1   collagenase (SANTYL) ointment, Apply 1 application topically daily., Disp: , Rfl:    ferrous sulfate 325 (65 FE) MG tablet, Take 325 mg by mouth daily with breakfast., Disp: , Rfl:    polyethylene glycol (MIRALAX) 17 g packet, Take 17 g by mouth daily., Disp: 14 each, Rfl: 0   senna (SENOKOT) 8.6 MG TABS tablet, Take 1 tablet (8.6 mg total) by mouth daily., Disp: 120 tablet, Rfl: 0   sulfamethoxazole-trimethoprim (BACTRIM DS) 800-160 MG tablet, Take 1 tablet by mouth 2 (two) times daily., Disp: 60 tablet, Rfl: 11   torsemide (DEMADEX) 20 MG tablet, Take 1 tablet (20 mg total) by mouth daily for 2 days., Disp: 2 tablet, Rfl: 0   vitamin B-12 (CYANOCOBALAMIN) 1000 MCG tablet, Take 1,000 mcg by mouth daily., Disp: , Rfl:    Vitamin D, Cholecalciferol, 25 MCG (1000 UT) TABS, Take 25 mcg by mouth daily., Disp: , Rfl:    zinc oxide 20 % ointment, Apply 1 application topically as needed for irritation., Disp: , Rfl:    potassium chloride SA (KLOR-CON) 20 MEQ tablet, Take 20 mEq by mouth 2 (two) times daily. (Patient not taking: Reported on 03/22/2021), Disp: , Rfl:    Review of Systems     Objective:   Physical Exam Constitutional:      Appearance: He is well-developed.  HENT:     Head: Normocephalic and atraumatic.  Eyes:     Conjunctiva/sclera: Conjunctivae normal.  Cardiovascular:     Rate and Rhythm: Normal rate and regular rhythm.  Pulmonary:      Effort: Pulmonary effort is normal. No respiratory distress.     Breath sounds: No wheezing.  Abdominal:     General: There is no distension.     Palpations: Abdomen is soft.  Musculoskeletal:        General: No tenderness. Normal range of motion.     Cervical back: Normal range of motion and neck supple.  Skin:    General: Skin is warm and dry.     Coloration: Skin is not pale.  Findings: No erythema or rash.  Neurological:     Mental Status: He is alert and oriented to person, place, and time.  Psychiatric:        Mood and Affect: Mood normal.        Behavior: Behavior normal.        Thought Content: Thought content normal.        Judgment: Judgment normal.    Left heel ulcer 01/11/2021:    Left heel ulcer February 14, 2021:     Wound 03/22/2021:        Assessment & Plan:  Left calcaneal osteomyelitis:  As mentioned before and he understands this clearly this is not curable with antibiotics.  Our hope is to prevent or forestall progression of infection or dissemination into the bloodstream and other sites.  Continue Bactrim double strength tablet twice daily we will check sed rate CRP basic metabolic panel and CBC with differential today  Parkinsonism: Likely his episode yesterday was due to parkinsonism when he had this tremulousness it was after he had exerted himself with exercise   hip fracture: Is healing.  DVT is continue on anticoagulation  Vaccine counseling he is up-to-date on COVID-19 and flu vaccine  Chronic kidney disease we will recheck metabolic panel today

## 2021-03-23 LAB — BASIC METABOLIC PANEL WITH GFR
BUN/Creatinine Ratio: 29 (calc) — ABNORMAL HIGH (ref 6–22)
BUN: 48 mg/dL — ABNORMAL HIGH (ref 7–25)
CO2: 26 mmol/L (ref 20–32)
Calcium: 8.6 mg/dL (ref 8.6–10.3)
Chloride: 101 mmol/L (ref 98–110)
Creat: 1.65 mg/dL — ABNORMAL HIGH (ref 0.70–1.22)
Glucose, Bld: 127 mg/dL — ABNORMAL HIGH (ref 65–99)
Potassium: 4.4 mmol/L (ref 3.5–5.3)
Sodium: 138 mmol/L (ref 135–146)
eGFR: 40 mL/min/{1.73_m2} — ABNORMAL LOW (ref >=60)

## 2021-03-23 LAB — CBC WITH DIFFERENTIAL/PLATELET
Absolute Monocytes: 831 cells/uL (ref 200–950)
Basophils Absolute: 23 cells/uL (ref 0–200)
Basophils Relative: 0.2 %
Eosinophils Absolute: 70 cells/uL (ref 15–500)
Eosinophils Relative: 0.6 %
HCT: 31.2 % — ABNORMAL LOW (ref 38.5–50.0)
Hemoglobin: 9.9 g/dL — ABNORMAL LOW (ref 13.2–17.1)
Lymphs Abs: 784 cells/uL — ABNORMAL LOW (ref 850–3900)
MCH: 27.2 pg (ref 27.0–33.0)
MCHC: 31.7 g/dL — ABNORMAL LOW (ref 32.0–36.0)
MCV: 85.7 fL (ref 80.0–100.0)
MPV: 10.1 fL (ref 7.5–12.5)
Monocytes Relative: 7.1 %
Neutro Abs: 9992 cells/uL — ABNORMAL HIGH (ref 1500–7800)
Neutrophils Relative %: 85.4 %
Platelets: 222 10*3/uL (ref 140–400)
RBC: 3.64 10*6/uL — ABNORMAL LOW (ref 4.20–5.80)
RDW: 18.3 % — ABNORMAL HIGH (ref 11.0–15.0)
Total Lymphocyte: 6.7 %
WBC: 11.7 10*3/uL — ABNORMAL HIGH (ref 3.8–10.8)

## 2021-03-23 LAB — C-REACTIVE PROTEIN: CRP: 151.2 mg/L — ABNORMAL HIGH (ref <8.0)

## 2021-03-23 LAB — SEDIMENTATION RATE: Sed Rate: 75 mm/h — ABNORMAL HIGH (ref 0–20)

## 2021-03-26 ENCOUNTER — Telehealth: Payer: Self-pay | Admitting: Pharmacist

## 2021-03-26 ENCOUNTER — Other Ambulatory Visit: Payer: Self-pay | Admitting: Pharmacist

## 2021-03-26 DIAGNOSIS — A4902 Methicillin resistant Staphylococcus aureus infection, unspecified site: Secondary | ICD-10-CM

## 2021-03-26 MED ORDER — SULFAMETHOXAZOLE-TRIMETHOPRIM 400-80 MG PO TABS
1.0000 | ORAL_TABLET | Freq: Two times a day (BID) | ORAL | 11 refills | Status: DC
Start: 2021-03-26 — End: 2021-07-05

## 2021-03-26 NOTE — Progress Notes (Signed)
Yeah, his kidney function is borderline the dose adjustment at CrCl ~ 30 ml/min. Since he's going to be on it for a while, I would reduce to SS Bactrim BID.  Marchelle Folks

## 2021-03-26 NOTE — Progress Notes (Signed)
Changing Bactrim from 1 DS tablet BID to 1 SS tablet BID given worsening renal function risk/benefit.   Margarite Gouge, PharmD, CPP Clinical Pharmacist Practitioner Infectious Diseases Clinical Pharmacist [facility]

## 2021-03-26 NOTE — Telephone Encounter (Signed)
error 

## 2021-04-03 ENCOUNTER — Other Ambulatory Visit: Payer: Self-pay

## 2021-04-03 ENCOUNTER — Encounter (HOSPITAL_BASED_OUTPATIENT_CLINIC_OR_DEPARTMENT_OTHER): Payer: Medicare Other | Admitting: Internal Medicine

## 2021-04-03 DIAGNOSIS — L89620 Pressure ulcer of left heel, unstageable: Secondary | ICD-10-CM | POA: Diagnosis not present

## 2021-04-03 NOTE — Progress Notes (Signed)
AYYUB KRALL ([id number]) , Visit Report for 04/03/2021 Debridement Details Patient Name: Date of Service: [name] 04/03/2021 10:00 A M Medical Record Number: [id number] Patient Account Number: [id number] Date of Birth/Sex: Treating RN: Jan 29, 1932 (87 y.o. [name], [name] Primary Care Provider: [name] Other Clinician: Referring Provider: Treating Provider/Extender: Allen Norris in Treatment: 12 Debridement Performed for Assessment: Wound #1 Left Calcaneus Performed By: Physician Maxwell Caul., MD Debridement Type: Debridement Level of Consciousness (Pre-procedure): Awake and Alert Pre-procedure Verification/Time Out Yes - 11:06 Taken: Start Time: 11:07 Pain Control: Other : Benzocaine T Area Debrided (L x W): otal 2 (cm) x 2 (cm) = 4 (cm) Tissue and other material debrided: Non-Viable, Eschar, Slough, Subcutaneous, Slough Level: Skin/Subcutaneous Tissue Debridement Description: Excisional Instrument: Blade, Forceps Bleeding: Minimum Hemostasis Achieved: Pressure End Time: 11:13 Response to Treatment: Procedure was tolerated well Level of Consciousness (Post- Awake and Alert procedure): Post Debridement Measurements of Total Wound Length: (cm) 5 Stage: Unstageable/Unclassified Width: (cm) 6.2 Depth: (cm) 0.7 Volume: (cm) 17.043 Character of Wound/Ulcer Post Debridement: Stable Post Procedure Diagnosis Same as Pre-procedure Electronic Signature(s) Signed: 04/03/2021 3:46:55 PM By: Baltazar Najjar MD Signed: 04/03/2021 5:09:47 PM By: Fonnie Mu RN Entered By: Baltazar Najjar on 04/03/2021 11:39:49 -------------------------------------------------------------------------------- HPI Details Patient Name: Date of Service: [name], [name] 04/03/2021 10:00 A M Medical Record Number: [id number] Patient Account Number: [id number] Date of Birth/Sex: Treating RN: [birth year] (87 y.o. [name], [name] Primary Care  Provider: [name] Other Clinician: Referring Provider: Treating Provider/Extender: Allen Norris in Treatment: 12 History of Present Illness HPI Description: ADMISSION 01/04/2021 Mr. Zea is a pleasant 63 year old woman who lives at [facility] [place] in the skilled facility. He is accompanied by his wife. I did previously been notified by Dr. Chales Abrahams primary physician about this patient. He apparently fractured his right acetabulum in July. Sometime in the recovery of this developed a pressure ulcer on the right heel. When I was first contacted by Dr. Chales Abrahams this was described as a necrotic wound and indeed this is an accurate description. Unfortunately cultures of this have shown MRSA and after 1 negative x-ray a subsequent x-ray on 01/02/2021 showed changes of osteomyelitis. The patient was started on IV vancomycin at the facility and I believe is also on oral trimethoprim/sulfamethoxazole. They are using silver alginate which was done at my suggestion. Sedimentation rate and the said facility was 55 CRP P was also elevated at 73.7 compatible with his osteomyelitis Past medical history includes a right acetabular fracture, DVT in the right femoral vein also in September on Eliquis, fairly severe Parkinson's disease. [facility] has had [facility] in the skilled facility. ABI in our clinic was noncompressible on the left 10/11; patient presents for follow-up. He has no issues or complaints today. He saw Dr. Daiva Eves on 10/7. He is currently on IV vancomycin. He currently denies systemic signs of infection. 10/18 remains on IV vancomycin. Santyl and silver alginate to the wound bed. He is at the nursing home section [facility] [place]. His wound has made some minor progress there is not as much necrotic tissue and some viable tissue on the surface however there is still an odor. The patient had noninvasive arterial tests done at my suggestion. On the left his ABI was  noncompressible his TBI was 0.37 with monophasic and biphasic waveforms. Arterial Doppler showed monophasic waveforms at the ATA and PTA. However there was no focally hemodynamic significant stenosis identified. 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 Remains on IV vancomycin difficult to get consistent answers but it seems like they were not interested in the vascular surgery consult at this time. The wound itself somewhat improved in terms of the amount of necrotic material but still a very difficult necrotic wound. There is exposed bone. We are using Santyl and silver alginate 11/15; he has completed her IV antibiotics and now is on trimethoprim sulfamethoxazole although I did not look over infectious disease notes. The wound is not really improved in size although the condition of the wound surface looks somewhat better there is still exposed bone. We have been using Santyl and silver alginate [facility] at [facility] I think it is quite possible the patient has significant PAD however the patient and his wife were not interested in consideration of a vascular referral therefore I have not made this 12/6; 3-week follow-up at their request. The patient completed IV antibiotics and apparently is on Septra DS. He lives in the skilled area of [facility] [place]. We tried to switch him to [facility] last time apparently that is not on their formulary and he was "unaffordable" per the information we got from the facility. Therefore still on Santyl and silver alginate. They are apparently rigorously offloading the heel when in bed. His wife had some questions about walking with physical therapy. As long as this is not putting undue pressure on the Achilles part of his heel where the wound is this should not be a problem 12/28; Still on Septra regular strength twice a day. Apparently this has been extended by Dr. Drucilla Schmidt. We are using Santyl under [facility]. We were never  able to get a copy of [facility] formulary. Electronic Signature(s) Signed: 04/03/2021 3:46:55 PM By: Linton Ham MD Entered By: Linton Ham on 04/03/2021 11:47:02 -------------------------------------------------------------------------------- Physical Exam Details Patient Name: Date of Service: [name], [name] 04/03/2021 10:00 A M Medical Record Number: [id number] Patient Account Number: [id number] Date of Birth/Sex: Treating RN: 05/06/1931 (87 y.o. Burnadette Pop, [name] Primary Care Provider: Veleta Miners Other Clinician: Referring Provider: Treating Provider/Extender: Carley Hammed in Treatment: 12 Constitutional Sitting or standing Blood Pressure is within target range for patient.. Pulse regular and within target range for patient.Marland Kitchen Respirations regular, non-labored and within target range.. Temperature is normal and within the target range for the patient.Marland Kitchen Appears in no distress. Notes Wound exam; again the same necrotic subcu surface superiorly I removed with pickups and a #15 scalpel. Hemostasis with direct pressure the rest of this looks fairly clean however it is right against bone. No exposed bone is seen and no evidence of infection Electronic Signature(s) Signed: 04/03/2021 3:46:55 PM By: Linton Ham MD Entered By: Linton Ham on 04/03/2021 11:50:51 -------------------------------------------------------------------------------- Physician Orders Details Patient Name: Date of Service: [name], [name] 04/03/2021 10:00 [place] Number: [id number] Patient Account Number: [id number] Date of Birth/Sex: Treating RN: 27-Nov-1931 (87 y.o. Marcheta Grammes Primary Care Provider: Veleta Miners Other Clinician: Referring Provider: Treating Provider/Extender: Carley Hammed in Treatment: 12 Verbal / Phone Orders: No Diagnosis Coding ICD-10 Coding Code Description [phone] Pressure ulcer of left  heel, unstageable M86.172 Other acute osteomyelitis, left ankle and foot B95.62 Methicillin resistant Staphylococcus aureus infection as the cause of diseases classified elsewhere Follow-up Appointments Return appointment in 1 month. - with Dr. Dellia Nims Other: - If possible, send treatment supply formulary so we know what products are available. Bathing/ Shower/ Hygiene May  shower with protection but do not get wound dressing(s) wet. Edema Control - Lymphedema / SCD / Other Elevate legs to the level of the heart or above for 30 minutes daily and/or when sitting, a frequency of: - throughout the day Exercise regularly - May walk with PT trying to avoid using shoes that apply pressure to heels. Off-Loading Turn and reposition every 2 hours Other: - Float heels off of bed/chair with pillow under calves Wound Treatment Wound #1 - Calcaneus Wound Laterality: Left Cleanser: Wound Cleanser Every Other Day/30 Days Discharge Instructions: Cleanse the wound with wound cleanser prior to applying a clean dressing using gauze sponges, not tissue or cotton balls. Prim Dressing: Hydrofera Blue Ready Foam, 2.5 x2.5 in Every Other Day/30 Days ary Discharge Instructions: Preferred, may use Silver Alginate if cannot get HFB Prim Dressing: Santyl Ointment Every Other Day/30 Days ary Discharge Instructions: Apply nickel thick amount to wound bed, under silver alginate Secondary Dressing: Woven Gauze Sponge, Non-Sterile 4x4 in Every Other Day/30 Days Discharge Instructions: Apply over primary dressing as directed. Secondary Dressing: ALLEVYN Heel 4 1/2in x 5 1/2in / 10.5cm x 13.5cm Every Other Day/30 Days Discharge Instructions: Apply over primary dressing as directed. Secured With: [employer], 4.5x3.1 (in/yd) Every Other Day/30 Days Discharge Instructions: Secure with Kerlix as directed. Secured With: Paper Tape, 2x10 (in/yd) Every Other Day/30 Days Discharge Instructions: Secure dressing with tape  as directed. Electronic Signature(s) Signed: 04/03/2021 3:30:35 PM By: Lorrin Jackson Signed: 04/03/2021 3:46:55 PM By: Linton Ham MD Entered By: Lorrin Jackson on 04/03/2021 11:17:28 -------------------------------------------------------------------------------- Problem List Details Patient Name: Date of Service: [name], [name] 04/03/2021 10:00 A M Medical Record Number: [id number] Patient Account Number: [id number] Date of Birth/Sex: Treating RN: 1931-11-23 (87 y.o. Marcheta Grammes Primary Care Provider: Veleta Miners Other Clinician: Referring Provider: Treating Provider/Extender: Carley Hammed in Treatment: 12 Active Problems ICD-10 Encounter Code Description Active Date MDM Diagnosis L89.620 Pressure ulcer of left heel, unstageable 01/04/2021 No Yes M86.172 Other acute osteomyelitis, left ankle and foot 01/04/2021 No Yes B95.62 Methicillin resistant Staphylococcus aureus infection as the cause of diseases 01/04/2021 No Yes classified elsewhere Inactive Problems Resolved Problems Electronic Signature(s) Signed: 04/03/2021 3:46:55 PM By: Linton Ham MD Entered By: Linton Ham on 04/03/2021 11:38:24 -------------------------------------------------------------------------------- Progress Note Details Patient Name: Date of Service: [name], [name] 04/03/2021 10:00 A M Medical Record Number: [id number] Patient Account Number: [id number] Date of Birth/Sex: Treating RN: April 01, 1932 (87 y.o. Burnadette Pop, [name] Primary Care Provider: Veleta Miners Other Clinician: Referring Provider: Treating Provider/Extender: Carley Hammed in Treatment: 12 Subjective History of Present Illness (HPI) ADMISSION 01/04/2021 Mr. Brannon is a pleasant 87 year old woman who lives at [place] in the skilled facility. He is accompanied by his wife. I did previously been notified by Dr. Lyndel Safe primary physician about this patient. He  apparently fractured his right acetabulum in July. Sometime in the recovery of this developed a pressure ulcer on the right heel. When I was first contacted by Dr. Lyndel Safe this was described as a necrotic wound and indeed this is an accurate description. Unfortunately cultures of this have shown MRSA and after 1 negative x-ray a subsequent x-ray on 01/02/2021 showed changes of osteomyelitis. The patient was started on IV vancomycin at the facility and I believe is also on oral trimethoprim/sulfamethoxazole. They are using silver alginate which was done at my suggestion. Sedimentation rate and the said facility was 55 CRP P was also elevated at 73.7 compatible  with his osteomyelitis Past medical history includes a right acetabular fracture, DVT in the right femoral vein also in September on Eliquis, fairly severe Parkinson's disease. [facility] has had [facility] in the skilled facility. ABI in our clinic was noncompressible on the left 10/11; patient presents for follow-up. He has no issues or complaints today. He saw Dr. Daiva Eves on 10/7. He is currently on IV vancomycin. He currently denies systemic signs of infection. 10/18 remains on IV vancomycin. Santyl and silver alginate to the wound bed. He is at the nursing home section [facility] [place]. His wound has made some minor progress there is not as much necrotic tissue and some viable tissue on the surface however there is still an odor. The patient had noninvasive arterial tests done at my suggestion. On the left his ABI was noncompressible his TBI was 0.37 with monophasic and biphasic waveforms. Arterial Doppler showed monophasic waveforms at the ATA and PTA. However there was no focally hemodynamic significant stenosis identified. 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 Remains on IV vancomycin difficult to get consistent answers but it seems like they were not interested in the vascular surgery consult at this time. The wound  itself somewhat improved in terms of the amount of necrotic material but still a very difficult necrotic wound. There is exposed bone. We are using Santyl and silver alginate 11/15; he has completed her IV antibiotics and now is on trimethoprim sulfamethoxazole although I did not look over infectious disease notes. The wound is not really improved in size although the condition of the wound surface looks somewhat better there is still exposed bone. We have been using Santyl and silver alginate [facility] at [facility] I think it is quite possible the patient has significant PAD however the patient and his wife were not interested in consideration of a vascular referral therefore I have not made this 12/6; 3-week follow-up at their request. The patient completed IV antibiotics and apparently is on Septra DS. He lives in the skilled area of [facility]. We tried to switch him to [facility] last time apparently that is not on their formulary and he was "unaffordable" per the information we got from the facility. Therefore still on Santyl and silver alginate. They are apparently rigorously offloading the heel when in bed. His wife had some questions about walking with physical therapy. As long as this is not putting undue pressure on the Achilles part of his heel where the wound is this should not be a problem 12/28; Still on Septra regular strength twice a day. Apparently this has been extended by Dr. Algis Liming. We are using Santyl under [facility]. We were never able to get a copy of [facility] formulary. Objective Constitutional Sitting or standing Blood Pressure is within target range for patient.. Pulse regular and within target range for patient.Marland Kitchen Respirations regular, non-labored and within target range.. Temperature is normal and within the target range for the patient.Marland Kitchen Appears in no distress. Vitals Time Taken: 10:37 AM, Height: 73 in, Weight: 180 lbs, BMI: 23.7,  Temperature: 98.3 F, Pulse: 83 bpm, Respiratory Rate: 18 breaths/min, Blood Pressure: 119/72 mmHg. General Notes: Wound exam; again the same necrotic subcu surface superiorly I removed with pickups and a #15 scalpel. Hemostasis with direct pressure the rest of this looks fairly clean however it is right against bone. No exposed bone is seen and no evidence of infection Integumentary (Hair, Skin) Wound #1 status is Open. Original  cause of wound was Blister. The date acquired was: 10/24/2020. The wound has been in treatment 12 weeks. The wound is located on the Left Calcaneus. The wound measures 5cm length x 6.2cm width x 0.7cm depth; 24.347cm^2 area and 17.043cm^3 volume. There is Fat Layer (Subcutaneous Tissue) exposed. There is no tunneling or undermining noted. There is a large amount of purulent drainage noted. The wound margin is distinct with the outline attached to the wound base. There is large (67-100%) pink, pale granulation within the wound bed. There is a small (1-33%) amount of necrotic tissue within the wound bed including Eschar and Adherent Slough. General Notes: Maceration noted Assessment Active Problems ICD-10 Pressure ulcer of left heel, unstageable Other acute osteomyelitis, left ankle and foot Methicillin resistant Staphylococcus aureus infection as the cause of diseases classified elsewhere Procedures Wound #1 Pre-procedure diagnosis of Wound #1 is a Pressure Ulcer located on the Left Calcaneus . There was a Excisional Skin/Subcutaneous Tissue Debridement with a total area of 4 sq cm performed by Ricard Dillon., MD. With the following instrument(s): Blade, and Forceps to remove Non-Viable tissue/material. Material removed includes Eschar, Subcutaneous Tissue, and Slough after achieving pain control using Other (Benzocaine). No specimens were taken. A time out was conducted at 11:06, prior to the start of the procedure. A Minimum amount of bleeding was controlled with  Pressure. The procedure was tolerated well. Post Debridement Measurements: 5cm length x 6.2cm width x 0.7cm depth; 17.043cm^3 volume. Post debridement Stage noted as Unstageable/Unclassified. Character of Wound/Ulcer Post Debridement is stable. Post procedure Diagnosis Wound #1: Same as Pre-Procedure Plan Follow-up Appointments: Return appointment in 1 month. - with Dr. Dellia Nims Other: - If possible, send treatment supply formulary so we know what products are available. Bathing/ Shower/ Hygiene: May shower with protection but do not get wound dressing(s) wet. Edema Control - Lymphedema / SCD / Other: Elevate legs to the level of the heart or above for 30 minutes daily and/or when sitting, a frequency of: - throughout the day Exercise regularly - May walk with PT trying to avoid using shoes that apply pressure to heels. Off-Loading: Turn and reposition every 2 hours Other: - Float heels off of bed/chair with pillow under calves WOUND #1: - Calcaneus Wound Laterality: Left Cleanser: Wound Cleanser Every Other Day/30 Days Discharge Instructions: Cleanse the wound with wound cleanser prior to applying a clean dressing using gauze sponges, not tissue or cotton balls. Prim Dressing: Hydrofera Blue Ready Foam, 2.5 x2.5 in Every Other Day/30 Days ary Discharge Instructions: Preferred, may use Silver Alginate if cannot get HFB Prim Dressing: Santyl Ointment Every Other Day/30 Days ary Discharge Instructions: Apply nickel thick amount to wound bed, under silver alginate Secondary Dressing: Woven Gauze Sponge, Non-Sterile 4x4 in Every Other Day/30 Days Discharge Instructions: Apply over primary dressing as directed. Secondary Dressing: ALLEVYN Heel 4 1/2in x 5 1/2in / 10.5cm x 13.5cm Every Other Day/30 Days Discharge Instructions: Apply over primary dressing as directed. Secured With: [employer], 4.5x3.1 (in/yd) Every Other Day/30 Days Discharge Instructions: Secure with Kerlix as  directed. Secured With: Paper T ape, 2x10 (in/yd) Every Other Day/30 Days Discharge Instructions: Secure dressing with tape as directed. 1. Still Santyl and Hydrofera Blue 2. They were never interested in a formal arterial evaluation I am not sure if that is playing a role here or not. He does not have a palpable dorsalis pedis or posterior tibial although edema in his foot and ankle makes that difficult to feel. 3. He continues  on regular strength Bactrim twice a day looking [facility]. Follow-up in 1 month Electronic Signature(s) Signed: 04/03/2021 3:46:55 PM By: Linton Ham MD Entered By: Linton Ham on 04/03/2021 11:52:08 -------------------------------------------------------------------------------- SuperBill Details Patient Name: Date of Service: [name], [name] 04/03/2021 Medical Record Number: [id number] Patient Account Number: [id number] Date of Birth/Sex: Treating RN: [birth year] (87 y.o. Marcheta Grammes Primary Care Provider: Veleta Miners Other Clinician: Referring Provider: Treating Provider/Extender: Carley Hammed in Treatment: 12 Diagnosis Coding ICD-10 Codes Code Description [phone] Pressure ulcer of left heel, unstageable M86.172 Other acute osteomyelitis, left ankle and foot B95.62 Methicillin resistant Staphylococcus aureus infection as the cause of diseases classified elsewhere Facility Procedures Physician Procedures : CPT4 Code Description Modifier [id number] - WC PHYS SUBQ TISS 20 SQ CM ICD-10 Diagnosis Description L89.620 Pressure ulcer of left heel, unstageable Quantity: 1 Electronic Signature(s) Signed: 04/03/2021 3:46:55 PM By: Linton Ham MD Entered By: Linton Ham on 04/03/2021 11:52:17

## 2021-04-03 NOTE — Progress Notes (Signed)
Greg [name] ([id number]) , Visit Report for 04/03/2021 Arrival Information Details Patient Name: Date of Service: Greg [name] [facility] 04/03/2021 10:00 Greg [name]: [id number] Patient Account [name]: [id number] Date of Birth/Sex: Treating RN: 12/15/1931 (87 y.o. Greg [name] Primary Care Greg [name]: Greg [name] Other Clinician: Referring Greg [name]: Treating Greg [name]/Extender: Carley Hammed in Treatment: 12 Visit Information History Since Last Visit Added or deleted any medications: No Patient Arrived: Wheel Chair Any new allergies or adverse reactions: No Arrival Time: 10:43 Had a fall or experienced change in No Accompanied By: spouse activities of daily living that may affect Transfer Assistance: Manual risk of falls: Patient Identification Verified: Yes Signs or symptoms of abuse/neglect since No Secondary Verification Process Completed: Yes last visito Patient Requires Transmission-Based Precautions: No Hospitalized since last visit: No Patient Has Alerts: Yes Implantable device outside of the clinic No Patient Alerts: Patient on Blood Thinner excluding L ABI non compressible cellular tissue based products placed in the center since last visit: Has Dressing in Place as Prescribed: Yes Has Footwear/Offloading in Place as Yes Prescribed: Right: Surgical Shoe with Pressure Relief Insole Pain Present Now: No Electronic Signature(s) Signed: 04/03/2021 3:30:35 PM By: Lorrin Jackson Entered By: Lorrin Jackson on 04/03/2021 10:43:48 -------------------------------------------------------------------------------- Encounter Discharge Information Details Patient Name: Date of Service: Greg [name], Greg [name] 04/03/2021 10:00 A M Medical Record [name]: [id number] Patient Account [name]: [id number] Date of Birth/Sex: Treating RN: [birth year] (87 y.o. Greg [name] Primary Care Tashema Tiller: Greg [name] Other Clinician: Referring Landin Tallon: Treating  Valisa Karpel/Extender: Carley Hammed in Treatment: 12 Encounter Discharge Information Items Post Procedure Vitals Discharge Condition: Stable Temperature (F): 98.3 Ambulatory Status: Wheelchair Pulse (bpm): 83 Discharge Destination: [place] Respiratory Rate (breaths/min): 18 Orders Sent: Yes Blood Pressure (mmHg): 119/72 Transportation: Other Accompanied By: wife Schedule Follow-up Appointment: Yes Clinical Summary of Care: Provided on 04/03/2021 Form Type Recipient Paper Patient Patient Electronic Signature(s) Signed: 04/03/2021 11:56:31 AM By: Lorrin Jackson Entered By: Lorrin Jackson on 04/03/2021 11:56:31 -------------------------------------------------------------------------------- Lower Extremity Assessment Details Patient Name: Date of Service: Greg [name] [name] 04/03/2021 10:00 A M Medical Record [name]: [id number] Patient Account [name]: [id number] Date of Birth/Sex: Treating RN: April 03, 1932 (87 y.o. Greg [name] Primary Care Iqra Rotundo: Greg [name] Other Clinician: Referring Juanda Luba: Treating Carroll Lingelbach/Extender: Carley Hammed in Treatment: 12 Edema Assessment Assessed: [name]: Yes] [name]: No] Edema: [Left: Ye] [Right: s] Calf Left: Right: Point of Measurement: 36 cm From Medial Instep 43 cm Ankle Left: Right: Point of Measurement: 11 cm From Medial Instep 228 cm Vascular Assessment Pulses: Dorsalis Pedis Palpable: [Left:Yes] Electronic Signature(s) Signed: 04/03/2021 3:30:35 PM By: Lorrin Jackson Entered By: Lorrin Jackson on 04/03/2021 10:49:42 -------------------------------------------------------------------------------- Multi Wound Chart Details Patient Name: Date of Service: Greg [name], Greg [name] 04/03/2021 10:00 A M Medical Record [name]: [id number] Patient Account [name]: [id number] Date of Birth/Sex: Treating RN: 1931/07/03 (87 y.o. Burnadette Pop, [name] Primary Care Breckan Cafiero: Greg [name] Other Clinician: Referring Ancil Dewan: Treating Kedra Mcglade/Extender: Carley Hammed in Treatment: 12 Vital Signs Height(in): 73 Pulse(bpm): 57 Weight(lbs): 180 Blood Pressure(mmHg): 119/72 Body Mass Index(BMI): 24 Temperature(F): 98.3 Respiratory Rate(breaths/min): 18 Photos: [1:Left Calcaneus] [N/A:N/A N/A] Wound Location: [1:Blister] [N/A:N/A] Wounding Event: [1:Pressure Ulcer] [N/A:N/A] Primary Etiology: [1:Hypertension, Osteoarthritis] [N/A:N/A] Comorbid History: [1:10/24/2020] [N/A:N/A] Date Acquired: [1:12] [N/A:N/A] Weeks of Treatment: [1:Open] [N/A:N/A] Wound Status: [1:5x6.2x0.7] [N/A:N/A] Measurements L x W x D (cm) [1:24.347] [N/A:N/A] A (cm) : rea [1:17.043] [N/A:N/A] Volume (cm) : [1:48.30%] [N/A:N/A] % Reduction in A rea: [1:-261.70%] [  N/A:N/A] % Reduction in Volume: [1:Unstageable/Unclassified] [N/A:N/A] Classification: [1:Large] [N/A:N/A] Exudate A mount: [1:Purulent] [N/A:N/A] Exudate Type: [1:yellow, brown, green] [N/A:N/A] Exudate Color: [1:Distinct, outline attached] [N/A:N/A] Wound Margin: [1:Large (67-100%)] [N/A:N/A] Granulation A mount: [1:Pink, Pale] [N/A:N/A] Granulation Quality: [1:Small (1-33%)] [N/A:N/A] Necrotic A mount: [1:Eschar, Adherent Slough] [N/A:N/A] Necrotic Tissue: [1:Fat Layer (Subcutaneous Tissue): Yes N/A] Exposed Structures: [1:Fascia: No Tendon: No Muscle: No Joint: No Bone: No Small (1-33%)] [N/A:N/A] Epithelialization: [1:Debridement - Excisional] [N/A:N/A] Debridement: Pre-procedure Verification/Time Out 11:06 [N/A:N/A] Taken: [1:Other] [N/A:N/A] Pain Control: [1:Necrotic/Eschar, Subcutaneous,] [N/A:N/A] Tissue Debrided: [1:Slough Skin/Subcutaneous Tissue] [N/A:N/A] Level: [1:4] [N/A:N/A] Debridement A (sq cm): [1:rea Blade, Forceps] [N/A:N/A] Instrument: [1:Minimum] [N/A:N/A] Bleeding: [1:Pressure] [N/A:N/A] Hemostasis Achieved: Debridement Treatment Response: Procedure was tolerated well  [N/A:N/A] Post Debridement Measurements L x 5x6.2x0.7 [N/A:N/A] W x D (cm) [1:17.043] [N/A:N/A] Post Debridement Volume: (cm) [1:Unstageable/Unclassified] [N/A:N/A] Post Debridement Stage: [1:Maceration noted] [N/A:N/A] Assessment Notes: [1:Debridement] [N/A:N/A] Treatment Notes Electronic Signature(s) Signed: 04/03/2021 3:46:55 PM By: Linton Ham MD Signed: 04/03/2021 5:09:47 PM By: Rhae Hammock RN Entered By: Linton Ham on 04/03/2021 11:39:36 -------------------------------------------------------------------------------- Multi-Disciplinary Care Plan Details Patient Name: Date of Service: Greg [name], Greg [name] 04/03/2021 10:00 A M Medical Record [name]: [id number] Patient Account [name]: [id number] Date of Birth/Sex: Treating RN: Dec 18, 1931 (87 y.o. Greg [name] Primary Care Khani Paino: Greg [name] Other Clinician: Referring Azai Gaffin: Treating Izaak Sahr/Extender: Carley Hammed in Treatment: Bonifay reviewed with physician Active Inactive Wound/Skin Impairment Nursing Diagnoses: Impaired tissue integrity Knowledge deficit related to ulceration/compromised skin integrity Goals: Patient/caregiver will verbalize understanding of skin care regimen Date Initiated: 01/04/2021 Date Inactivated: 02/05/2021 Target Resolution Date: 02/01/2021 Goal Status: Met Ulcer/skin breakdown will have a volume reduction of 30% by week 4 Date Initiated: 02/05/2021 Date Inactivated: 04/03/2021 Target Resolution Date: 03/12/2021 Goal Status: Met Ulcer/skin breakdown will have a volume reduction of 50% by week 8 Date Initiated: 04/03/2021 Target Resolution Date: 04/24/2021 Goal Status: Active Interventions: Assess patient/caregiver ability to obtain necessary supplies Assess patient/caregiver ability to perform ulcer/skin care regimen upon admission and as needed Assess ulceration(s) every visit Provide education on ulcer and skin  care Notes: 02/05/21: Wound not yet at 30% volume reduction, patient has infection and receiving IV antibiotics. Electronic Signature(s) Signed: 04/03/2021 3:30:35 PM By: Lorrin Jackson Entered By: Lorrin Jackson on 04/03/2021 10:51:31 -------------------------------------------------------------------------------- Pain Assessment Details Patient Name: Date of Service: Greg [name] [name] 04/03/2021 10:00 A M Medical Record [name]: [id number] Patient Account [name]: [id number] Date of Birth/Sex: Treating RN: 09-29-1931 (87 y.o. Greg [name] Primary Care Aviendha Azbell: Greg [name] Other Clinician: Referring Archit Leger: Treating Lirio Bach/Extender: Carley Hammed in Treatment: 12 Active Problems Location of Pain Severity and Description of Pain Patient Has Paino No Site Locations Pain Management and Medication Current Pain Management: Electronic Signature(s) Signed: 04/03/2021 3:30:35 PM By: Lorrin Jackson Entered By: Lorrin Jackson on 04/03/2021 10:43:59 -------------------------------------------------------------------------------- Patient/Caregiver Education Details Patient Name: Date of Service: Greg [name], Greg [name] 12/28/2022andnbsp10:00 [place] [name]: [id number] Patient Account [name]: [id number] Date of Birth/Gender: Treating RN: 1931-05-10 (87 y.o. Greg [name] Primary Care Physician: Greg [name] Other Clinician: Referring Physician: Treating Physician/Extender: Carley Hammed in Treatment: 12 Education Assessment Education Provided To: Patient Education Topics Provided Offloading: Methods: Explain/Verbal, Printed Responses: State content correctly Wound/Skin Impairment: Methods: Explain/Verbal, Printed Responses: State content correctly Electronic Signature(s) Signed: 04/03/2021 3:30:35 PM By: Lorrin Jackson Entered By: Lorrin Jackson on 04/03/2021  10:32:56 -------------------------------------------------------------------------------- Wound Assessment Details Patient Name: Date of Service: Greg [name], Greg [name] 04/03/2021 10:00  A M Medical Record [name]: [id number] Patient Account [name]: [id number] Date of Birth/Sex: Treating RN: 1932-01-24 (87 y.o. Greg [name] Primary Care Reghan Thul: Greg [name] Other Clinician: Referring Lydell Moga: Treating Kailly Richoux/Extender: Carley Hammed in Treatment: 12 Wound Status Wound [name]: 1 Primary Etiology: Pressure Ulcer Wound Location: Left Calcaneus Wound Status: Open Wounding Event: Blister Comorbid History: Hypertension, Osteoarthritis Date Acquired: 10/24/2020 Weeks Of Treatment: 12 Clustered Wound: No Photos Wound Measurements Length: (cm) 5 Width: (cm) 6.2 Depth: (cm) 0.7 Area: (cm) 24.347 Volume: (cm) 17.043 % Reduction in Area: 48.3% % Reduction in Volume: -261.7% Epithelialization: Small (1-33%) Tunneling: No Undermining: No Wound Description Classification: Unstageable/Unclassified Wound Margin: Distinct, outline attached Exudate Amount: Large Exudate Type: Purulent Exudate Color: yellow, brown, green Foul Odor After Cleansing: No Slough/Fibrino Yes Wound Bed Granulation Amount: Large (67-100%) Exposed Structure Granulation Quality: Pink, Pale Fascia Exposed: No Necrotic Amount: Small (1-33%) Fat Layer (Subcutaneous Tissue) Exposed: Yes Necrotic Quality: Eschar, Adherent Slough Tendon Exposed: No Muscle Exposed: No Joint Exposed: No Bone Exposed: No Assessment Notes Maceration noted Treatment Notes Wound #1 (Calcaneus) Wound Laterality: Left Cleanser Wound Cleanser Discharge Instruction: Cleanse the wound with wound cleanser prior to applying a clean dressing using gauze sponges, not tissue or cotton balls. Peri-Wound Care Topical Primary Dressing Hydrofera Blue Ready Foam, 2.5 x2.5 in Discharge Instruction: Preferred, may use  Silver Alginate if cannot get HFB Santyl Ointment Discharge Instruction: Apply nickel thick amount to wound bed, under silver alginate Secondary Dressing Woven Gauze Sponge, Non-Sterile 4x4 in Discharge Instruction: Apply over primary dressing as directed. ALLEVYN Heel 4 1/2in x 5 1/2in / 10.5cm x 13.5cm Discharge Instruction: Apply over primary dressing as directed. Secured With [employer], 4.5x3.1 (in/yd) Discharge Instruction: Secure with Kerlix as directed. Paper Tape, 2x10 (in/yd) Discharge Instruction: Secure dressing with tape as directed. Compression Wrap Compression Stockings Add-Ons Electronic Signature(s) Signed: 04/03/2021 3:30:35 PM By: Lorrin Jackson Entered By: Lorrin Jackson on 04/03/2021 10:47:49 -------------------------------------------------------------------------------- Vitals Details Patient Name: Date of Service: Greg [name], Greg [name] 04/03/2021 10:00 A M Medical Record [name]: [id number] Patient Account [name]: [id number] Date of Birth/Sex: Treating RN: [birth year] (87 y.o. Greg [name] Primary Care Ly Wass: Greg [name] Other Clinician: Referring Marasia Newhall: Treating Anthany Thornhill/Extender: Carley Hammed in Treatment: 12 Vital Signs Time Taken: 10:37 Temperature (F): 98.3 Height (in): 73 Pulse (bpm): 83 Weight (lbs): 180 Respiratory Rate (breaths/min): 18 Body Mass Index (BMI): 23.7 Blood Pressure (mmHg): 119/72 Reference Range: 80 - 120 mg / dl Electronic Signature(s) Signed: 04/03/2021 3:30:35 PM By: Lorrin Jackson Entered By: Lorrin Jackson on 04/03/2021 10:37:19

## 2021-04-04 LAB — BASIC METABOLIC PANEL
BUN: 39 — AB (ref 4–21)
CO2: 29 — AB (ref 13–22)
Chloride: 103 (ref 99–108)
Creatinine: 1.6 — AB (ref 0.6–1.3)
Glucose: 88
Potassium: 4.4 (ref 3.4–5.3)
Sodium: 139 (ref 137–147)

## 2021-04-04 LAB — COMPREHENSIVE METABOLIC PANEL: Calcium: 8.6 — AB (ref 8.7–10.7)

## 2021-04-05 ENCOUNTER — Non-Acute Institutional Stay (SKILLED_NURSING_FACILITY): Payer: Medicare Other | Admitting: Orthopedic Surgery

## 2021-04-05 ENCOUNTER — Encounter: Payer: Self-pay | Admitting: Orthopedic Surgery

## 2021-04-05 DIAGNOSIS — D500 Iron deficiency anemia secondary to blood loss (chronic): Secondary | ICD-10-CM

## 2021-04-05 DIAGNOSIS — I82411 Acute embolism and thrombosis of right femoral vein: Secondary | ICD-10-CM | POA: Diagnosis not present

## 2021-04-05 DIAGNOSIS — I5031 Acute diastolic (congestive) heart failure: Secondary | ICD-10-CM | POA: Diagnosis not present

## 2021-04-05 DIAGNOSIS — E538 Deficiency of other specified B group vitamins: Secondary | ICD-10-CM

## 2021-04-05 DIAGNOSIS — I509 Heart failure, unspecified: Secondary | ICD-10-CM

## 2021-04-05 DIAGNOSIS — G20 Parkinson's disease: Secondary | ICD-10-CM

## 2021-04-05 DIAGNOSIS — R7303 Prediabetes: Secondary | ICD-10-CM

## 2021-04-05 DIAGNOSIS — M810 Age-related osteoporosis without current pathological fracture: Secondary | ICD-10-CM

## 2021-04-05 DIAGNOSIS — H6123 Impacted cerumen, bilateral: Secondary | ICD-10-CM

## 2021-04-05 DIAGNOSIS — I10 Essential (primary) hypertension: Secondary | ICD-10-CM

## 2021-04-05 DIAGNOSIS — N1832 Chronic kidney disease, stage 3b: Secondary | ICD-10-CM

## 2021-04-05 DIAGNOSIS — M86172 Other acute osteomyelitis, left ankle and foot: Secondary | ICD-10-CM

## 2021-04-05 DIAGNOSIS — G20A1 Parkinson's disease without dyskinesia, without mention of fluctuations: Secondary | ICD-10-CM

## 2021-04-05 DIAGNOSIS — K5901 Slow transit constipation: Secondary | ICD-10-CM

## 2021-04-05 MED ORDER — TORSEMIDE 20 MG PO TABS: 20.0000 mg | ORAL_TABLET | Freq: Every day | ORAL | 0 refills | Status: DC

## 2021-04-05 NOTE — Progress Notes (Signed)
Location:  [place] Room Number: 27 Place of Service:  SNF [phone]) Provider:  Windell Moulding, AGNP-C  Virgie Dad, MD  Patient Care Team: Virgie Dad, MD as PCP - General (Internal Medicine) [name], Eustace Quail, DO as Consulting Physician (Neurology)  Extended Emergency Contact Information Primary Emergency Contact: Vergia Alcon Address: [place].          apt [phone] Johnnette Litter of [place] Phone: [phone] Mobile Phone: [phone] Relation: Spouse Secondary Emergency Contact: tillman, inserra Mobile Phone: [phone] Relation: Son  Code Status:  Full code Goals of care: Advanced Directive information Advanced Directives 02/22/2021  Does Patient Have a Medical Advance Directive? Yes  Type of Advance Directive Living will  Does patient want to make changes to medical advance directive? No - Patient declined  Would patient like information on creating a medical advance directive? -     Chief Complaint  Patient presents with   Medical Management of Chronic Issues    HPI:  Pt is a 87 y.o. male seen today for medical management of chronic diseases.    He currently resides on the skilled nursing unit at [facility]. [place] includes: CHF, DVT, HTN, constipation, parkinson's, left heel osteomyelitis,  osteoporosis, BPH, CKD stage 3, and prediabetes.   CHF/lower leg edema: LV EF 60-65% 02/20/2021, weight has fluctuated in past 2 months, denies sob today, pitting edema BLE L>R, remains on torsemide daily  Left calcaneous osteomyelitis- wound culture indicated MRSA, followed by wound care center and ID, PICC line and IV vanc discontinued, remains on Bactrim DS, dressing changes consist of Santyl and Hydrofera Blue patches daily, ABI completed no stenosis- consistent with tibial outflow disease,12/28 seen by Dr. Dellia Nims reports less necrotic tissue/ bone exposed, he reports mild heel pain this morning- resolved with tylenol prn DVT  of deep femoral vein- diagnosed 12/2020, remains on Eliquis Parkinson's-  diagnosed 2019, followed by neurology, remains on sinemet HTN- BUN/creat 48/1.65, off carvedilol at this time CKD 3b- see above Prediabetes- - A1c 5.9 10/08/2020 Anemia- hgb 9.9 03/22/2021, no apparent blood loss at this time, FOBT x3- one positive result, started on ferrous sulfate M/W/F Vitamin B12 deficiency- B12 205 03/04/2021, remains on cyanocobalamin daily Osteoporosis- ORIF right hip 10/10/2020, stopped fosamax 2018 (6 years use), remains on calcium and vitamin D supplements Constipation- LBM 12/29, remains on miralax and senna  No recent falls, injuries or behavioral outbursts. Transfers with walker. Ambulates mainly with wheelchair.   Recent blood pressures:  12/27- 116/71  12/20- 102/58  12/13- 138/79  Recent weights:  12/30- 186.5 lbs  12/02- 176.8 lbs  11/02- 203 lbs    Past Medical History:  Diagnosis Date   Acute bronchiolitis    Acute osteomyelitis of calcaneum, left ([place]) 01/11/2021   Anemia    Bursitis of both hips    Hearing reduced    hearing aid   History of basal cell carcinoma    Hyperlipidemia    Malaise and fatigue    MRSA infection 01/11/2021   Osteoarthritis    hand   Osteoporosis    Palpitation    Vitamin D deficiency    Past Surgical History:  Procedure Laterality Date   CATARACT EXTRACTION  05/31/2015   Eye/right   COLONOSCOPY  02/05/2005, 03/06/2005   normal, 10 years ago   MOHS micrographic surgery     Face surgery   ORIF ACETABULAR FRACTURE Right 10/09/2020   Procedure: OPEN REDUCTION INTERNAL FIXATION (ORIF) ACETABULAR  FRACTURE;  Surgeon: [name], MD;  Location: [facility];  Service: Orthopedics;  Laterality: Right;    No Known Allergies  Outpatient Encounter Medications as of 04/05/2021  Medication Sig   Acetaminophen 325 MG CAPS Take 2 capsules by mouth every 6 (six) hours as needed.   albuterol (VENTOLIN HFA) 108 (90 Base) MCG/ACT inhaler Inhale 2  puffs into the lungs every 4 (four) hours as needed for wheezing or shortness of breath.   Amino Acids-Protein Hydrolys (FEEDING SUPPLEMENT, PRO-STAT SUGAR FREE 64,) LIQD Take 30 mLs by mouth in the morning and at bedtime.   apixaban (ELIQUIS) 5 MG TABS tablet Take 1 tablet (5 mg total) by mouth 2 (two) times daily.   Calcium Carbonate-Vitamin D (CALCIUM-VITAMIN D3 PO) Take 600 mg by mouth in the morning.   carbidopa-levodopa (SINEMET CR) 50-200 MG tablet Take 1 tablet by mouth at bedtime.   carbidopa-levodopa (SINEMET IR) 25-100 MG tablet Take 1 tablet by mouth 4 (four) times daily.   collagenase (SANTYL) ointment Apply 1 application topically daily.   ferrous sulfate 325 (65 FE) MG tablet Take 325 mg by mouth daily with breakfast.   polyethylene glycol (MIRALAX) 17 g packet Take 17 g by mouth daily.   potassium chloride SA (KLOR-CON) 20 MEQ tablet Take 20 mEq by mouth 2 (two) times daily. (Patient not taking: Reported on 03/22/2021)   senna (SENOKOT) 8.6 MG TABS tablet Take 1 tablet (8.6 mg total) by mouth daily.   sulfamethoxazole-trimethoprim (BACTRIM) 400-80 MG tablet Take 1 tablet by mouth 2 (two) times daily.   torsemide (DEMADEX) 20 MG tablet Take 1 tablet (20 mg total) by mouth daily for 2 days.   vitamin B-12 (CYANOCOBALAMIN) 1000 MCG tablet Take 1,000 mcg by mouth daily.   Vitamin D, Cholecalciferol, 25 MCG (1000 UT) TABS Take 25 mcg by mouth daily.   zinc oxide 20 % ointment Apply 1 application topically as needed for irritation.   No facility-administered encounter medications on file as of 04/05/2021.    Review of Systems  Constitutional:  Negative for activity change, appetite change, chills, fatigue and fever.  HENT:  Negative for congestion, facial swelling and trouble swallowing.   Eyes:  Negative for photophobia and visual disturbance.  Respiratory:  Negative for cough, shortness of breath and wheezing.   Cardiovascular:  Positive for leg swelling. Negative for chest pain.   Gastrointestinal:  Positive for constipation. Negative for abdominal distention, abdominal pain, blood in stool, diarrhea, nausea and vomiting.  Genitourinary:  Positive for frequency. Negative for dysuria and hematuria.  Musculoskeletal:  Positive for arthralgias and gait problem.  Skin:  Positive for wound. Negative for rash.  Neurological:  Positive for tremors and weakness. Negative for dizziness and headaches.  Psychiatric/Behavioral:  Negative for dysphoric mood and sleep disturbance. The patient is not nervous/anxious.    Immunization History  Administered Date(s) Administered   H1N1 06/13/2008   Influenza Split 03/05/2006, 02/26/2007, 01/26/2008, 01/18/2009, 12/12/2009, 01/16/2011, 01/29/2012, 02/01/2013, 02/08/2014, 02/13/2015, 01/10/2016, 02/23/2017, 02/24/2018, 01/24/2019, 12/22/2019   Influenza, High Dose Seasonal PF 01/30/2021   PFIZER(Purple Top)SARS-COV-2 Vaccination 05/04/2019, 05/25/2019, 09/01/2020   Pfizer Covid-19 Vaccine Bivalent Booster 1yrs & up 12/27/2019, 12/26/2020   Pneumococcal Conjugate-13 02/10/2014   Pneumococcal Polysaccharide-23 06/10/2011   Zoster, Live 07/04/2009   Pertinent  Health Maintenance Due  Topic Date Due   INFLUENZA VACCINE  Completed   Fall Risk 10/11/2020 10/11/2020 10/12/2020 01/10/2021 03/22/2021  Falls in the past year? - - - 1 0  Was there an injury with  Fall? - - - 1 0  Fall Risk Category Calculator - - - 2 0  Fall Risk Category - - - Moderate Low  Patient Fall Risk Level High fall risk High fall risk High fall risk High fall risk -   Functional Status Survey:    Vitals:   04/05/21 1406  BP: 116/71  Pulse: 78  Resp: 13  Temp: (!) 97.4 F (36.3 C)  SpO2: 95%  Weight: 186 lb 8 oz (84.6 kg)  Height: 5\' 9"  (1.753 m)   Body mass index is 27.54 kg/m. Physical Exam Vitals reviewed.  Constitutional:      General: He is not in acute distress. HENT:     Head: Normocephalic.     Right Ear: There is impacted cerumen.     Left  Ear: There is impacted cerumen.     Nose: Nose normal.     Mouth/Throat:     Mouth: Mucous membranes are moist.     Pharynx: No oropharyngeal exudate or posterior oropharyngeal erythema.  Eyes:     General:        Right eye: No discharge.        Left eye: No discharge.  Neck:     Vascular: No carotid bruit.  Cardiovascular:     Rate and Rhythm: Normal rate and regular rhythm.     Pulses: Normal pulses.     Heart sounds: Murmur heard.  Pulmonary:     Effort: Pulmonary effort is normal. No respiratory distress.     Breath sounds: Normal breath sounds. No wheezing.  Abdominal:     General: Bowel sounds are normal. There is no distension.     Palpations: Abdomen is soft.     Tenderness: There is no abdominal tenderness.  Musculoskeletal:     Cervical back: Normal range of motion.     Right lower leg: Edema present.     Left lower leg: Edema present.     Comments: Left +1 pitting, right non-pitting  Lymphadenopathy:     Cervical: No cervical adenopathy.  Skin:    General: Skin is warm and dry.     Capillary Refill: Capillary refill takes less than 2 seconds.     Comments: Extremities dry, left heel dressing CDI, wound bed not visualized  Neurological:     General: No focal deficit present.     Mental Status: He is alert. Mental status is at baseline.     Motor: Weakness present.     Gait: Gait abnormal.     Comments: Stiffness  Psychiatric:        Mood and Affect: Mood normal.        Speech: Speech is delayed.        Behavior: Behavior normal.    Labs reviewed: Recent Labs    10/09/20 0546 10/09/20 0937 10/10/20 0607 10/11/20 0037 10/12/20 0146 10/25/20 0000 02/14/21 0000 02/21/21 0000 03/22/21 1141  NA  --    < > 131* 129* 132*   < > 139 139 138  K  --    < > 4.2 4.5 4.1   < > 4.3 5.2 4.4  CL  --    < > 97* 96* 97*   < > 102 102 101  CO2  --    < > 28 29 28    < > 31* 28* 26  GLUCOSE  --    < > 134* 114* 122*  --   --   --  127*  BUN  --    < >  23 26* 24*   <  > 37* 47* 48*  CREATININE  --    < > 1.20 1.14 1.14   < > 1.1 2.1* 1.65*  CALCIUM  --    < > 8.7* 8.6* 8.4*   < > 8.2* 8.8 8.6  MG  --   --  2.1  --   --   --   --   --   --   PHOS 3.4  --   --   --  3.5  --   --   --   --    < > = values in this interval not displayed.   Recent Labs    10/09/20 0937 10/11/20 0037 10/12/20 0146 12/26/20 0000 12/27/20 0000  AST 22 34  --  19 18  ALT 7 <5  --  9* 6*  ALKPHOS 31* 24*  --  55 46  BILITOT 1.7* 1.1  --   --   --   PROT 5.9* 4.6*  --   --   --   ALBUMIN 3.4* 2.6* 2.6* 3.7 2.9*   Recent Labs    10/11/20 0037 10/12/20 0146 10/25/20 0000 02/14/21 0000 02/21/21 0000 03/22/21 1141  WBC 10.1 8.5   < > 7.5 6.5 11.7*  NEUTROABS  --  6.4   < > 5,123.00 4,310.00 9,992*  HGB 7.4* 7.8*   < > 9.6* 10.5* 9.9*  HCT 21.8* 22.8*   < > 31* 33* 31.2*  MCV 96.9 96.6  --   --   --  85.7  PLT 90* 116*   < > 337 307 222   < > = values in this interval not displayed.   No results found for: TSH Lab Results  Component Value Date   HGBA1C 5.9 (H) 10/08/2020   No results found for: CHOL, HDL, LDLCALC, LDLDIRECT, TRIG, CHOLHDL  Significant Diagnostic Results in last 30 days:  No results found.  Assessment/Plan 1. Acute heart failure with preserved ejection fraction (HCC) - slowly increasing weight/ up 10 lbs, no sob, BLE L>R +1 pitting - will increase torsemide to 40 mg po daily x 2 days  - cont daily weights - cont NAS diet  2. Acute osteomyelitis of left calcaneus (HCC) - followed by Dr. Dellia Nims and Dr. Tommy Medal - remains on Bactrim DS - cont dressing changes with Santyl and Hydrofera Blue   3. DVT of deep femoral vein, right ([place]) - diagnosed 12/2020 - remains on Eliquis  4. Parkinson's disease ([place]) - diagnosed 2019 - followed by neurology - cont sinemet - cont skilled nursing care  5. Primary hypertension - controlled without medication  6. Stage 3b chronic kidney disease ([place]) - cont to avoid nephrotoxic drugs like NSAIDS  and dose adjust medications to be renally excreted  7. Prediabetes - A1c 5.9 10/08/2020  8. Blood loss anemia - hgb 9.9 03/22/2021 - cont ferrous sulfate M/W/F  9. Vitamin B12 deficiency - cont vitamin B12 daily  10. Age-related osteoporosis without current pathological fracture - Fosamax stopped 2018- used 6 years - cont calcium and vit D supplement  11. Slow transit constipation - LBM 12/29, abdomen soft - cont senna and miralax  12. Bilateral impacted cerumen - cannot visualize TM - start debrox 5 gtts- apply to both ears qhs x 5 days - flush ears with warm water when debrox complete   Family/ staff Communication: plan discussed with patient and nurse  Labs/tests ordered:  none

## 2021-04-11 LAB — COMPREHENSIVE METABOLIC PANEL: Calcium: 8.3 — AB (ref 8.7–10.7)

## 2021-04-11 LAB — BASIC METABOLIC PANEL
BUN: 37 — AB (ref 4–21)
CO2: 29 — AB (ref 13–22)
Chloride: 103 (ref 99–108)
Creatinine: 1.5 — AB (ref 0.6–1.3)
Glucose: 91
Potassium: 4.3 (ref 3.4–5.3)
Sodium: 138 (ref 137–147)

## 2021-04-15 ENCOUNTER — Encounter: Payer: Self-pay | Admitting: Orthopedic Surgery

## 2021-04-15 ENCOUNTER — Non-Acute Institutional Stay (SKILLED_NURSING_FACILITY): Payer: Medicare Other | Admitting: Orthopedic Surgery

## 2021-04-15 DIAGNOSIS — I10 Essential (primary) hypertension: Secondary | ICD-10-CM

## 2021-04-15 DIAGNOSIS — M86172 Other acute osteomyelitis, left ankle and foot: Secondary | ICD-10-CM

## 2021-04-15 DIAGNOSIS — G20 Parkinson's disease: Secondary | ICD-10-CM

## 2021-04-15 DIAGNOSIS — R600 Localized edema: Secondary | ICD-10-CM | POA: Diagnosis not present

## 2021-04-15 DIAGNOSIS — I5031 Acute diastolic (congestive) heart failure: Secondary | ICD-10-CM

## 2021-04-15 DIAGNOSIS — I82411 Acute embolism and thrombosis of right femoral vein: Secondary | ICD-10-CM

## 2021-04-15 MED ORDER — TORSEMIDE 20 MG PO TABS: 20.0000 mg | ORAL_TABLET | ORAL | 0 refills | Status: DC

## 2021-04-15 NOTE — Progress Notes (Signed)
Location:   [place] Room Number: 27-A Place of Service:  SNF [phone]) Provider:  Windell Moulding, NP    Patient Care Team: Greg Dad, MD as PCP - General (Internal Medicine) [name], Eustace Quail, DO as Consulting Physician (Neurology)  Extended Emergency Contact Information Primary Emergency Contact: Vergia Alcon Address: [place].          apt [phone] Johnnette Litter of [place] Phone: [phone] Mobile Phone: [phone] Relation: Spouse Secondary Emergency Contact: omarr, sarma Mobile Phone: [phone] Relation: Son  Code Status:  FULL CODE Goals of care: Advanced Directive information Advanced Directives 04/15/2021  Does Patient Have a Medical Advance Directive? Yes  Type of Advance Directive Living will  Does patient want to make changes to medical advance directive? No - Patient declined  Would patient like information on creating a medical advance directive? -     Chief Complaint  Patient presents with   Acute Visit    Left Knee Swelling.    HPI:  Pt is a 88 y.o. male seen today for an acute visit for lower leg swelling.   He currently resides on the skilled nursing unit at [facility]. [place] includes: CHF, DVT, HTN, constipation, parkinson's, left heel osteomyelitis,  osteoporosis, BPH, CKD stage 3, and prediabetes.    CHF/lower leg edema: LV EF 60-65% 02/20/2021, weight has fluctuated in past 2 months, denies sob today, increased pitting edema to lower extremities L>R today, nursing staff measured legs: left calf 44 cm, right calf 34 cm, he denies calf pain or increased pain to extremities Left calcaneous osteomyelitis- wound culture indicated MRSA, followed by wound care center and ID, PICC line and IV vanc discontinued, remains on Bactrim DS, dressing changes consist of Santyl and Hydrofera Blue patches daily, ABI completed no stenosis- consistent with tibial outflow disease,12/28 seen by Dr. Dellia Nims reports less  necrotic tissue/ bone exposed, he reports mild heel pain this morning- resolved with tylenol prn DVT of deep femoral vein- diagnosed 12/2020, remains on Eliquis Parkinson's-  diagnosed 2019, followed by neurology, remains on sinemet HTN- BUN/creat 37/1.5 04/11/2020, off carvedilol at this time  Recent weights:  01/06- 188.8 lbs  12/30- 186.5 lbs  12/14- 179.9 lbs    Past Medical History:  Diagnosis Date   Acute bronchiolitis    Acute osteomyelitis of calcaneum, left ([place]) 01/11/2021   Anemia    Bursitis of both hips    Hearing reduced    hearing aid   History of basal cell carcinoma    Hyperlipidemia    Malaise and fatigue    MRSA infection 01/11/2021   Osteoarthritis    hand   Osteoporosis    Palpitation    Vitamin D deficiency    Past Surgical History:  Procedure Laterality Date   CATARACT EXTRACTION  05/31/2015   Eye/right   COLONOSCOPY  02/05/2005, 03/06/2005   normal, 10 years ago   MOHS micrographic surgery     Face surgery   ORIF ACETABULAR FRACTURE Right 10/09/2020   Procedure: OPEN REDUCTION INTERNAL FIXATION (ORIF) ACETABULAR FRACTURE;  Surgeon: Altamese Sperry[place], MD;  Location: [facility];  Service: Orthopedics;  Laterality: Right;    No Known Allergies  Allergies as of 04/15/2021   No Known Allergies      Medication List        Accurate as of April 15, 2021  3:49 PM. If you have any questions, ask your nurse or doctor.  STOP taking these medications    potassium chloride SA 20 MEQ tablet Commonly known as: KLOR-CON M Stopped by: Yvonna Alanis, NP       TAKE these medications    Acetaminophen 325 MG Caps Take 2 capsules by mouth every 6 (six) hours as needed.   albuterol 108 (90 Base) MCG/ACT inhaler Commonly known as: VENTOLIN HFA Inhale 2 puffs into the lungs every 4 (four) hours as needed for wheezing or shortness of breath.   apixaban 5 MG Tabs tablet Commonly known as: ELIQUIS Take 1 tablet (5 mg total) by mouth 2 (two) times  daily.   CALCIUM-VITAMIN D3 PO Take 600 mg by mouth in the morning.   carbidopa-levodopa 25-100 MG tablet Commonly known as: SINEMET IR Take 1 tablet by mouth 4 (four) times daily.   carbidopa-levodopa 50-200 MG tablet Commonly known as: SINEMET CR Take 1 tablet by mouth at bedtime.   collagenase ointment Commonly known as: SANTYL Apply 1 application topically daily.   feeding supplement (PRO-STAT SUGAR FREE 64) Liqd Take 30 mLs by mouth in the morning and at bedtime.   ferrous sulfate 325 (65 FE) MG tablet Take 325 mg by mouth daily with breakfast.   polyethylene glycol 17 g packet Commonly known as: MiraLax Take 17 g by mouth daily.   senna 8.6 MG Tabs tablet Commonly known as: SENOKOT Take 1 tablet (8.6 mg total) by mouth daily.   sulfamethoxazole-trimethoprim 400-80 MG tablet Commonly known as: BACTRIM Take 1 tablet by mouth 2 (two) times daily.   torsemide 20 MG tablet Commonly known as: DEMADEX Take 1 tablet (20 mg total) by mouth daily.   vitamin B-12 1000 MCG tablet Commonly known as: CYANOCOBALAMIN Take 1,000 mcg by mouth daily.   Vitamin D (Cholecalciferol) 25 MCG (1000 UT) Tabs Take 25 mcg by mouth daily.   zinc oxide 20 % ointment Apply 1 application topically as needed for irritation.        Review of Systems  Constitutional:  Negative for activity change, appetite change, chills, fatigue and fever.  HENT:  Negative for congestion and trouble swallowing.   Eyes:  Negative for visual disturbance.  Respiratory:  Negative for cough, shortness of breath and wheezing.   Cardiovascular:  Positive for leg swelling. Negative for chest pain.  Gastrointestinal:  Positive for constipation. Negative for diarrhea and nausea.  Genitourinary:  Negative for dysuria, frequency and hematuria.  Musculoskeletal:  Positive for gait problem.  Skin:  Negative for wound.  Neurological:  Positive for tremors and weakness. Negative for dizziness.   Psychiatric/Behavioral:  Negative for dysphoric mood. The patient is not nervous/anxious.    Immunization History  Administered Date(s) Administered   H1N1 06/13/2008   Influenza Split 03/05/2006, 02/26/2007, 01/26/2008, 01/18/2009, 12/12/2009, 01/16/2011, 01/29/2012, 02/01/2013, 02/08/2014, 02/13/2015, 01/10/2016, 02/23/2017, 02/24/2018, 01/24/2019, 12/22/2019   Influenza, High Dose Seasonal PF 01/30/2021   PFIZER(Purple Top)SARS-COV-2 Vaccination 05/04/2019, 05/25/2019, 09/01/2020   Pfizer Covid-19 Vaccine Bivalent Booster 66yrs & up 12/27/2019, 12/26/2020   Pneumococcal Conjugate-13 02/10/2014   Pneumococcal Polysaccharide-23 06/10/2011   Zoster, Live 07/04/2009   Pertinent  Health Maintenance Due  Topic Date Due   INFLUENZA VACCINE  Completed   Fall Risk 10/11/2020 10/11/2020 10/12/2020 01/10/2021 03/22/2021  Falls in the past year? - - - 1 0  Was there an injury with Fall? - - - 1 0  Fall Risk Category Calculator - - - 2 0  Fall Risk Category - - - Moderate Low  Patient Fall Risk Level High fall  risk High fall risk High fall risk High fall risk -   Functional Status Survey:    Vitals:   04/15/21 1540  BP: 110/64  Pulse: 65  Resp: 20  Temp: (!) 97.4 F (36.3 C)  SpO2: 96%  Weight: 188 lb 12.8 oz (85.6 kg)  Height: 5\' 9"  (1.753 m)   Body mass index is 27.88 kg/m. Physical Exam Vitals reviewed.  Constitutional:      General: He is not in acute distress. HENT:     Head: Normocephalic.  Eyes:     General:        Right eye: No discharge.        Left eye: No discharge.  Neck:     Vascular: No carotid bruit.  Cardiovascular:     Rate and Rhythm: Normal rate and regular rhythm.     Pulses: Normal pulses.     Heart sounds: Normal heart sounds. No murmur heard. Pulmonary:     Effort: Pulmonary effort is normal. No respiratory distress.     Breath sounds: Normal breath sounds. No wheezing.  Abdominal:     General: Bowel sounds are normal. There is no distension.      Tenderness: There is no abdominal tenderness.  Musculoskeletal:     Cervical back: Normal range of motion.     Right lower leg: Edema present.     Left lower leg: Edema present.     Comments: LLE 2+ pitting, RLE 1+ pitting, extremities cool to touch  Lymphadenopathy:     Cervical: No cervical adenopathy.  Skin:    General: Skin is warm and dry.     Comments: Left heel dressing CDI, no odor  Neurological:     Mental Status: He is alert.    Labs reviewed: Recent Labs    10/09/20 0546 10/09/20 0937 10/10/20 0607 10/11/20 0037 10/12/20 0146 10/25/20 0000 03/22/21 1141 04/04/21 0000 04/11/21 0000  NA  --    < > 131* 129* 132*   < > 138 139 138  K  --    < > 4.2 4.5 4.1   < > 4.4 4.4 4.3  CL  --    < > 97* 96* 97*   < > 101 103 103  CO2  --    < > 28 29 28    < > 26 29* 29*  GLUCOSE  --    < > 134* 114* 122*  --  127*  --   --   BUN  --    < > 23 26* 24*   < > 48* 39* 37*  CREATININE  --    < > 1.20 1.14 1.14   < > 1.65* 1.6* 1.5*  CALCIUM  --    < > 8.7* 8.6* 8.4*   < > 8.6 8.6* 8.3*  MG  --   --  2.1  --   --   --   --   --   --   PHOS 3.4  --   --   --  3.5  --   --   --   --    < > = values in this interval not displayed.   Recent Labs    10/09/20 0937 10/11/20 0037 10/12/20 0146 12/26/20 0000 12/27/20 0000  AST 22 34  --  19 18  ALT 7 <5  --  9* 6*  ALKPHOS 31* 24*  --  55 46  BILITOT 1.7* 1.1  --   --   --  PROT 5.9* 4.6*  --   --   --   ALBUMIN 3.4* 2.6* 2.6* 3.7 2.9*   Recent Labs    10/11/20 0037 10/12/20 0146 10/25/20 0000 02/14/21 0000 02/21/21 0000 03/22/21 1141  WBC 10.1 8.5   < > 7.5 6.5 11.7*  NEUTROABS  --  6.4   < > 5,123.00 4,310.00 9,992*  HGB 7.4* 7.8*   < > 9.6* 10.5* 9.9*  HCT 21.8* 22.8*   < > 31* 33* 31.2*  MCV 96.9 96.6  --   --   --  85.7  PLT 90* 116*   < > 337 307 222   < > = values in this interval not displayed.   No results found for: TSH Lab Results  Component Value Date   HGBA1C 5.9 (H) 10/08/2020   No results found  for: CHOL, HDL, LDLCALC, LDLDIRECT, TRIG, CHOLHDL  Significant Diagnostic Results in last 30 days:  No results found.  Assessment/Plan 1. Acute heart failure with preserved ejection fraction (HCC) - weight up 10 lbs from 03/20/2021, no sob, pitting edema L>R - cont torsemide 20 mg po daily - give additional torsemide 20 mg po once  - start additional torsemide 20 mg po daily- give on Tuesdays and thursdays - cont daily weights - cont NAS diet  2. Bilateral leg edema - see above - no fever, no calf pain or warmth, increased pain - CHF/ cellulitis/ DVT? - cbc/diff - may consider doppler lower extremities in future  3. Acute osteomyelitis of left calcaneus (HCC) - followed by Dr. Dellia Nims and Dr. Tommy Medal - remains on Bactrim DS - cont dressing changes with Santyl and Hydrofera Blue   4. DVT of deep femoral vein, right ([place]) - diagnosed 12/2020 - remains on Eliquis  5. Parkinson's disease ([place]) - diagnosed 2019 - followed by neurology - cont sinemet - cont skilled nursing care  6. Primary hypertension - BUN/creat 37/1.5 04/11/2020 - controlled without medication     Family/ staff Communication: plan discussed with patient and nurse  Labs/tests ordered:  none

## 2021-04-18 LAB — CBC AND DIFFERENTIAL
HCT: 32 — AB (ref 41–53)
Hemoglobin: 10.1 — AB (ref 13.5–17.5)
Neutrophils Absolute: 4665
Platelets: 291 (ref 150–399)
WBC: 7.1

## 2021-04-18 LAB — CBC: RBC: 3.59 — AB (ref 3.87–5.11)

## 2021-04-25 ENCOUNTER — Non-Acute Institutional Stay (SKILLED_NURSING_FACILITY): Payer: Medicare Other | Admitting: Internal Medicine

## 2021-04-25 ENCOUNTER — Encounter: Payer: Self-pay | Admitting: Internal Medicine

## 2021-04-25 DIAGNOSIS — I82411 Acute embolism and thrombosis of right femoral vein: Secondary | ICD-10-CM

## 2021-04-25 DIAGNOSIS — M86172 Other acute osteomyelitis, left ankle and foot: Secondary | ICD-10-CM | POA: Diagnosis not present

## 2021-04-25 DIAGNOSIS — R600 Localized edema: Secondary | ICD-10-CM | POA: Diagnosis not present

## 2021-04-25 DIAGNOSIS — I959 Hypotension, unspecified: Secondary | ICD-10-CM

## 2021-04-25 DIAGNOSIS — N1832 Chronic kidney disease, stage 3b: Secondary | ICD-10-CM

## 2021-04-25 DIAGNOSIS — G20 Parkinson's disease: Secondary | ICD-10-CM | POA: Diagnosis not present

## 2021-04-25 NOTE — Progress Notes (Signed)
Location:   [facility] Room Number: 27 Place of Service:  SNF [phone]) Provider:  Veleta Miners MD  Virgie Dad, MD  Patient Care Team: Virgie Dad, MD as PCP - General (Internal Medicine) [name], Eustace Quail, DO as Consulting Physician (Neurology)  Extended Emergency Contact Information Primary Emergency Contact: Vergia Alcon Address: [place].          apt [phone] Johnnette Litter of [place] Phone: [phone] Mobile Phone: [phone] Relation: Spouse Secondary Emergency Contact: alekxander, grossberg Mobile Phone: [phone] Relation: Son  Code Status:  Full Code Goals of care: Advanced Directive information Advanced Directives 04/25/2021  Does Patient Have a Medical Advance Directive? Yes  Type of Advance Directive Living will  Does patient want to make changes to medical advance directive? No - Patient declined  Would patient like information on creating a medical advance directive? -     Chief Complaint  Patient presents with   Medical Management of Chronic Issues    HPI:  Pt is a 88 y.o. male seen today for medical management of chronic diseases.      Patient has left  Calcaneous Osteomyelitis with Chronic Wound  Patient has h/o Parkinson disease Follows with Dr [name] Diagnosed in 2019 in [place] Has ,Bradykinesia and Rigidity MRI of head show mild small vessel disease as well as a few old microhemorrhages in the right cerebellum and right cerebral hemisphere.  MRI of the cervical spine that same date demonstrating multilevel degenerative changes, most pronounced at C3-C4.  Also has h/o Osteoporosis , Vit D def Has been on Fosamax before, BPH, Arthritis,   Admitted in Hospital from 7/4-7/8 for Commuted Right Acetabulum Fracture ORIF on 7/06   Followed by Acute DVT of Right Leg    Patient had DTI in his Left Heel Was diagnosed with osteomyelitis Positive culture for MRSA  Treated with IV vanco followed by now PO  bactrim  Sees Dr Dellia Nims in his wound Clinic Per Dr Dellia Nims wound is not making much Progress. ID has recommended Amputation He has no pain No Fever He does say it is hard for him to walk and Put pressure on the wound He still wants to try Wound care rather then Amputation No SOB . Does have edema in his Legs.Weight is stable Wt Readings from Last 3 Encounters:  04/25/21 185 lb 14.4 oz (84.3 kg)  04/15/21 188 lb 12.8 oz (85.6 kg)  04/05/21 186 lb 8 oz (84.6 kg)    Past Medical History:  Diagnosis Date   Acute bronchiolitis    Acute osteomyelitis of calcaneum, left ([facility]) 01/11/2021   Anemia    Bursitis of both hips    Hearing reduced    hearing aid   History of basal cell carcinoma    Hyperlipidemia    Malaise and fatigue    MRSA infection 01/11/2021   Osteoarthritis    hand   Osteoporosis    Palpitation    Vitamin D deficiency    Past Surgical History:  Procedure Laterality Date   CATARACT EXTRACTION  05/31/2015   Eye/right   COLONOSCOPY  02/05/2005, 03/06/2005   normal, 10 years ago   MOHS micrographic surgery     Face surgery   ORIF ACETABULAR FRACTURE Right 10/09/2020   Procedure: OPEN REDUCTION INTERNAL FIXATION (ORIF) ACETABULAR FRACTURE;  Surgeon: Altamese Glenpool, MD;  Location: [facility];  Service: Orthopedics;  Laterality: Right;    No Known Allergies  Allergies as of 04/25/2021  No Known Allergies      Medication List        Accurate as of April 25, 2021  3:06 PM. If you have any questions, ask your nurse or doctor.          Acetaminophen 325 MG Caps Take 2 capsules by mouth every 6 (six) hours as needed.   albuterol 108 (90 Base) MCG/ACT inhaler Commonly known as: VENTOLIN HFA Inhale 2 puffs into the lungs every 4 (four) hours as needed for wheezing or shortness of breath.   apixaban 5 MG Tabs tablet Commonly known as: ELIQUIS Take 1 tablet (5 mg total) by mouth 2 (two) times daily.   CALCIUM-VITAMIN D3 PO Take 600 mg by mouth in the morning.    carbidopa-levodopa 25-100 MG tablet Commonly known as: SINEMET IR Take 1 tablet by mouth 4 (four) times daily.   carbidopa-levodopa 50-200 MG tablet Commonly known as: SINEMET CR Take 1 tablet by mouth at bedtime.   collagenase ointment Commonly known as: SANTYL Apply 1 application topically daily.   feeding supplement (PRO-STAT SUGAR FREE 64) Liqd Take 30 mLs by mouth in the morning and at bedtime.   ferrous sulfate 325 (65 FE) MG tablet Take 325 mg by mouth. Once A Day on Mon, Wed, Fri   polyethylene glycol 17 g packet Commonly known as: MiraLax Take 17 g by mouth daily.   senna 8.6 MG Tabs tablet Commonly known as: SENOKOT Take 1 tablet (8.6 mg total) by mouth daily.   sulfamethoxazole-trimethoprim 400-80 MG tablet Commonly known as: BACTRIM Take 1 tablet by mouth 2 (two) times daily.   torsemide 20 MG tablet Commonly known as: DEMADEX Take 1 tablet (20 mg total) by mouth daily.   torsemide 20 MG tablet Commonly known as: DEMADEX Take 1 tablet (20 mg total) by mouth 2 (two) times a week. Administer on Tuesdays and Thursdays   vitamin B-12 1000 MCG tablet Commonly known as: CYANOCOBALAMIN Take 1,000 mcg by mouth daily.   Vitamin D (Cholecalciferol) 25 MCG (1000 UT) Tabs Take 25 mcg by mouth daily.   zinc oxide 20 % ointment Apply 1 application topically as needed for irritation.        Review of Systems  Constitutional:  Negative for activity change, appetite change and unexpected weight change.  HENT: Negative.    Respiratory:  Negative for cough and shortness of breath.   Cardiovascular:  Negative for leg swelling.  Gastrointestinal:  Negative for constipation.  Genitourinary:  Negative for frequency.  Musculoskeletal:  Negative for arthralgias, gait problem and myalgias.  Skin: Negative.  Negative for rash.  Neurological:  Negative for dizziness and weakness.  Psychiatric/Behavioral:  Negative for confusion and sleep disturbance.   All other  systems reviewed and are negative.  Immunization History  Administered Date(s) Administered   H1N1 06/13/2008   Influenza Split 03/05/2006, 02/26/2007, 01/26/2008, 01/18/2009, 12/12/2009, 01/16/2011, 01/29/2012, 02/01/2013, 02/08/2014, 02/13/2015, 01/10/2016, 02/23/2017, 02/24/2018, 01/24/2019, 12/22/2019   Influenza, High Dose Seasonal PF 01/30/2021   PFIZER(Purple Top)SARS-COV-2 Vaccination 05/04/2019, 05/25/2019, 09/01/2020   Pfizer Covid-19 Vaccine Bivalent Booster 45yrs & up 12/27/2019, 12/26/2020   Pneumococcal Conjugate-13 02/10/2014   Pneumococcal Polysaccharide-23 06/10/2011   Zoster, Live 07/04/2009   Pertinent  Health Maintenance Due  Topic Date Due   INFLUENZA VACCINE  Completed   Fall Risk 10/11/2020 10/11/2020 10/12/2020 01/10/2021 03/22/2021  Falls in the past year? - - - 1 0  Was there an injury with Fall? - - - 1 0  Fall Risk Category Calculator - - -  2 0  Fall Risk Category - - - Moderate Low  Patient Fall Risk Level High fall risk High fall risk High fall risk High fall risk -   Functional Status Survey:    Vitals:   04/25/21 1456  BP: (!) 88/49  Pulse: 70  Resp: 16  Temp: 97.7 F (36.5 C)  SpO2: 94%  Weight: 185 lb 14.4 oz (84.3 kg)  Height: 5\' 9"  (1.753 m)   Body mass index is 27.45 kg/m. Physical Exam Vitals reviewed.  Constitutional:      Appearance: Normal appearance.  HENT:     Head: Normocephalic.     Mouth/Throat:     Mouth: Mucous membranes are moist.     Pharynx: Oropharynx is clear.  Eyes:     Pupils: Pupils are equal, round, and reactive to light.  Cardiovascular:     Rate and Rhythm: Normal rate and regular rhythm.     Pulses: Normal pulses.     Heart sounds: No murmur heard. Pulmonary:     Effort: Pulmonary effort is normal. No respiratory distress.     Breath sounds: Normal breath sounds. No rales.  Abdominal:     General: Abdomen is flat. Bowel sounds are normal.     Palpations: Abdomen is soft.  Musculoskeletal:         General: Swelling present.     Cervical back: Neck supple.  Skin:    General: Skin is warm.     Comments: Left Heel wound Unstageble 5 x 6 Cm  Clean Base Some necrosis     Neurological:     General: No focal deficit present.     Mental Status: He is alert and oriented to person, place, and time.  Psychiatric:        Mood and Affect: Mood normal.        Thought Content: Thought content normal.    Labs reviewed: Recent Labs    10/09/20 0546 10/09/20 0937 10/10/20 0607 10/11/20 0037 10/12/20 0146 10/25/20 0000 03/22/21 1141 04/04/21 0000 04/11/21 0000  NA  --    < > 131* 129* 132*   < > 138 139 138  K  --    < > 4.2 4.5 4.1   < > 4.4 4.4 4.3  CL  --    < > 97* 96* 97*   < > 101 103 103  CO2  --    < > 28 29 28    < > 26 29* 29*  GLUCOSE  --    < > 134* 114* 122*  --  127*  --   --   BUN  --    < > 23 26* 24*   < > 48* 39* 37*  CREATININE  --    < > 1.20 1.14 1.14   < > 1.65* 1.6* 1.5*  CALCIUM  --    < > 8.7* 8.6* 8.4*   < > 8.6 8.6* 8.3*  MG  --   --  2.1  --   --   --   --   --   --   PHOS 3.4  --   --   --  3.5  --   --   --   --    < > = values in this interval not displayed.   Recent Labs    10/09/20 0937 10/11/20 0037 10/12/20 0146 12/26/20 0000 12/27/20 0000  AST 22 34  --  19 18  ALT 7 <5  --  9* 6*  ALKPHOS 31* 24*  --  55 46  BILITOT 1.7* 1.1  --   --   --   PROT 5.9* 4.6*  --   --   --   ALBUMIN 3.4* 2.6* 2.6* 3.7 2.9*   Recent Labs    10/11/20 0037 10/12/20 0146 10/25/20 0000 02/21/21 0000 03/22/21 1141 04/18/21 0000  WBC 10.1 8.5   < > 6.5 11.7* 7.1  NEUTROABS  --  6.4   < > 4,310.00 9,992* 4,665.00  HGB 7.4* 7.8*   < > 10.5* 9.9* 10.1*  HCT 21.8* 22.8*   < > 33* 31.2* 32*  MCV 96.9 96.6  --   --  85.7  --   PLT 90* 116*   < > 307 222 291   < > = values in this interval not displayed.   No results found for: TSH Lab Results  Component Value Date   HGBA1C 5.9 (H) 10/08/2020   No results found for: CHOL, HDL, LDLCALC, LDLDIRECT, TRIG,  CHOLHDL  Significant Diagnostic Results in last 30 days:  No results found.  Assessment/Plan 1. Bilateral leg edema On Demadex Creat stable Still has lot of edema Cannot do too aggressive Diuresis due to Low BP and CKD Echo EF 60-65% with Aortic sclerosis  2. Acute osteomyelitis of left calcaneus ([facility]) On Bactrim Continue Wound care Very Slow Healing Possibility of Amputation.   3. DVT of deep femoral vein, right ([facility]CC)Post Hip Fracture On Eliquis since 12/21/20  4. Parkinson's disease ([place]) On Sinemet  5. Stage 3b chronic kidney disease (HCC) Creat stable Monitor on Bactrim and Demadex  6. Hypotension, unspecified hypotension type Will check the BP QD So far he has not c/o Dizziness 7 Anemia Due to CKD On Iron 8 S/p Right Hip Acetabulum Fracture s/p ORIF on 07/06 Doing well off all Pain meds Walking with therapy     Family/ staff Communication:   Labs/tests ordered:     Total time spent in this patient care encounter was  45_  minutes; greater than 50% of the visit spent counseling patient and staff, reviewing records , Labs and coordinating care for problems addressed at this encounter.

## 2021-04-30 ENCOUNTER — Encounter (HOSPITAL_BASED_OUTPATIENT_CLINIC_OR_DEPARTMENT_OTHER): Payer: Medicare Other | Attending: Internal Medicine | Admitting: Internal Medicine

## 2021-04-30 ENCOUNTER — Other Ambulatory Visit: Payer: Self-pay

## 2021-04-30 DIAGNOSIS — M86172 Other acute osteomyelitis, left ankle and foot: Secondary | ICD-10-CM | POA: Diagnosis not present

## 2021-04-30 DIAGNOSIS — Z7901 Long term (current) use of anticoagulants: Secondary | ICD-10-CM | POA: Insufficient documentation

## 2021-04-30 DIAGNOSIS — Z86718 Personal history of other venous thrombosis and embolism: Secondary | ICD-10-CM | POA: Diagnosis not present

## 2021-04-30 DIAGNOSIS — L89620 Pressure ulcer of left heel, unstageable: Secondary | ICD-10-CM | POA: Insufficient documentation

## 2021-04-30 DIAGNOSIS — B9562 Methicillin resistant Staphylococcus aureus infection as the cause of diseases classified elsewhere: Secondary | ICD-10-CM | POA: Diagnosis not present

## 2021-04-30 DIAGNOSIS — G20 Parkinson's disease: Secondary | ICD-10-CM | POA: Insufficient documentation

## 2021-05-02 NOTE — Progress Notes (Signed)
DEREN CAZENAVE ([id number]) , Visit Report for 04/30/2021 HPI Details Patient Name: Date of Service: [name] [facility] 04/30/2021 10:30 A M Medical Record Number: [id number] Patient Account Number: [id number] Date of Birth/Sex: Treating RN: Aug 17, 1931 (88 y.o. [name] Primary Care Provider: Veleta Miners Other Clinician: Referring Provider: Treating Provider/Extender: Carley Hammed in Treatment: 16 History of Present Illness HPI Description: ADMISSION 01/04/2021 Mr. Butterly is a pleasant 88 year old woman who lives at [facility] in the skilled facility. He is accompanied by his wife. I did previously been notified by Dr. Lyndel Safe primary physician about this patient. He apparently fractured his right acetabulum in July. Sometime in the recovery of this developed a pressure ulcer on the right heel. When I was first contacted by Dr. Lyndel Safe this was described as a necrotic wound and indeed this is an accurate description. Unfortunately cultures of this have shown MRSA and after 1 negative x-ray a subsequent x-ray on 01/02/2021 showed changes of osteomyelitis. The patient was started on IV vancomycin at the facility and I believe is also on oral trimethoprim/sulfamethoxazole. They are using silver alginate which was done at my suggestion. Sedimentation rate and the said facility was 55 CRP P was also elevated at 73.7 compatible with his osteomyelitis Past medical history includes a right acetabular fracture, DVT in the right femoral vein also in September on Eliquis, fairly severe Parkinson's disease. He has had [facility] [place] in the skilled facility. ABI in our clinic was noncompressible on the left 10/11; patient presents for follow-up. He has no issues or complaints today. He saw Dr. Tommy Medal on 10/7. He is currently on IV vancomycin. He currently denies systemic signs of infection. 10/18 remains on IV vancomycin. Santyl and silver alginate to the wound bed. He  is at [facility] [place]. His wound has made some minor progress there is not as much necrotic tissue and some viable tissue on the surface however there is still an odor. The patient had noninvasive arterial tests done at my suggestion. On the left his ABI was noncompressible his TBI was 0.37 with monophasic and biphasic waveforms. Arterial Doppler showed monophasic waveforms at the ATA and PTA. However there was no focally hemodynamic significant stenosis identified. 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 Remains on IV vancomycin difficult to get consistent answers but it seems like they were not interested in the vascular surgery consult at this time. The wound itself somewhat improved in terms of the amount of necrotic material but still a very difficult necrotic wound. There is exposed bone. We are using Santyl and silver alginate 11/15; he has completed her IV antibiotics and now is on trimethoprim sulfamethoxazole although I did not look over infectious disease notes. The wound is not really improved in size although the condition of the wound surface looks somewhat better there is still exposed bone. We have been using Santyl and silver alginate [facility] at [facility] I think it is quite possible the patient has significant PAD however the patient and his wife were not interested in consideration of a vascular referral therefore I have not made this 12/6; 3-week follow-up at their request. The patient completed IV antibiotics and apparently is on Septra DS. He lives in the skilled area of [facility] [place]. We tried to switch him to [facility] last time apparently that is not on their formulary and he was "unaffordable" per the information we got from the facility. Therefore  still on Santyl and silver alginate. They are apparently rigorously offloading the heel when in bed. His wife had some questions about walking with physical  therapy. As long as this is not putting undue pressure on the Achilles part of his heel where the wound is this should not be a problem 12/28; Still on Septra regular strength twice a day. Apparently this has been extended by Dr. Drucilla Schmidt. We are using Santyl under [facility]. We were never able to get a copy of [facility] formulary. 1/24; 1 month follow-up. Follows with Dr. Drucilla Schmidt in March. Still using Santyl and Hydrofera Blue. Although the wound surface does not look so bad there is extension of this wound superiorly up the back of his Achilles tendon. There is some very firm material which I think is probably calcification rather than bone Electronic Signature(s) Signed: 04/30/2021 4:35:26 PM By: Linton Ham MD Entered By: Linton Ham on 04/30/2021 12:06:32 -------------------------------------------------------------------------------- Physical Exam Details Patient Name: Date of Service: [name], [name] 04/30/2021 10:30 A M Medical Record Number: [id number] Patient Account Number: [id number] Date of Birth/Sex: Treating RN: 1931/12/30 (88 y.o. [name] Primary Care Provider: Veleta Miners Other Clinician: Referring Provider: Treating Provider/Extender: Carley Hammed in Treatment: 16 Constitutional Sitting or standing Blood Pressure is within target range for patient.. Pulse regular and within target range for patient.Marland Kitchen Respirations regular, non-labored and within target range.. Temperature is normal and within the target range for the patient.Marland Kitchen Appears in no distress. Cardiovascular Pedal pulses are absent. Notes Wound exam; some subcutaneous debris on this surface and around the circumference I did not debride this today. The concerning thing is tunneling superiorly in this wound up the surface of his Achilles. There is some very firm material which I think is subcutaneous calcification. There is no overt infection although there is  significant amount of edema around the wound Electronic Signature(s) Signed: 04/30/2021 4:35:26 PM By: Linton Ham MD Entered By: Linton Ham on 04/30/2021 12:07:49 -------------------------------------------------------------------------------- Physician Orders Details Patient Name: Date of Service: [name], [name] 04/30/2021 10:30 A M Medical Record Number: [id number] Patient Account Number: [id number] Date of Birth/Sex: Treating RN: 02-04-1932 (88 y.o. Burnadette Pop, [name] Primary Care Provider: Veleta Miners Other Clinician: Referring Provider: Treating Provider/Extender: Carley Hammed in Treatment: [phone] Verbal / Phone Orders: No Diagnosis Coding Follow-up Appointments ppointment in 2 weeks. - Dr. Dellia Nims Return A Other: - If possible, send treatment supply formulary so we know what products are available. Bathing/ Shower/ Hygiene May shower with protection but do not get wound dressing(s) wet. Edema Control - Lymphedema / SCD / Other Elevate legs to the level of the heart or above for 30 minutes daily and/or when sitting, a frequency of: - throughout the day Exercise regularly - May walk with PT trying to avoid using shoes that apply pressure to heels. Off-Loading Turn and reposition every 2 hours Other: - Float heels off of bed/chair with pillow under calves Wound Treatment Wound #1 - Calcaneus Wound Laterality: Left Cleanser: Wound Cleanser Every Other Day/30 Days Discharge Instructions: Cleanse the wound with wound cleanser prior to applying a clean dressing using gauze sponges, not tissue or cotton balls. Prim Dressing: Hydrofera Blue Ready Foam, 2.5 x2.5 in Every Other Day/30 Days ary Discharge Instructions: Preferred, may use Silver Alginate if cannot get HFB Prim Dressing: Santyl Ointment Every Other Day/30 Days ary Discharge Instructions: Apply nickel thick amount to wound bed, under silver alginate Secondary Dressing: Woven Gauze Sponge,  Non-Sterile 4x4 in Every Other Day/30 Days Discharge Instructions: Apply over primary dressing as directed. Secondary Dressing: ALLEVYN Heel 4 1/2in x 5 1/2in / 10.5cm x 13.5cm Every Other Day/30 Days Discharge Instructions: Apply over primary dressing as directed. Secured With: [employer], 4.5x3.1 (in/yd) Every Other Day/30 Days Discharge Instructions: Secure with Kerlix as directed. Secured With: Paper Tape, 2x10 (in/yd) Every Other Day/30 Days Discharge Instructions: Secure dressing with tape as directed. Services and Therapies nkle Brachial Index (ABI) - Refer to Dr. Kennon Holter office for ABI's/TBI's/Doppler's Left Leg A Electronic Signature(s) Signed: 04/30/2021 4:35:26 PM By: Linton Ham MD Signed: 05/02/2021 5:37:29 PM By: Rhae Hammock RN Entered By: Rhae Hammock on 04/30/2021 11:26:39 Prescription 04/30/2021 Sudie Bailey -------------------------------------------------------------------------------- , Linton Ham MD Patient Name: Provider: Apr 03, 1932 [id number] Date of Birth: NPI#: Jerilynn Mages [id number] Sex: DEA #: [phone] [id number] Phone #: License #: [place] Patient Address: [address], [place] [address] [place], [place] [address] [phone] Allergies No Known Drug Allergies Provider's Orders nkle Brachial Index (ABI) - Refer to Dr. Kennon Holter office for ABI's/TBI's/Doppler's Left Leg A Hand Signature: Date(s): Electronic Signature(s) Signed: 04/30/2021 4:35:26 PM By: Linton Ham MD Signed: 05/02/2021 5:37:29 PM By: Rhae Hammock RN Entered By: Rhae Hammock on 04/30/2021 11:26:40 -------------------------------------------------------------------------------- Problem List Details Patient Name: Date of Service: [name], [name] 04/30/2021 10:30 A M Medical Record Number: [id number] Patient Account Number: [id number] Date of Birth/Sex: Treating  RN: 05/23/1931 (87 y.o. [name] Primary Care Provider: Veleta Miners Other Clinician: Referring Provider: Treating Provider/Extender: Carley Hammed in Treatment: 16 Active Problems ICD-10 Encounter Code Description Active Date MDM Diagnosis L89.620 Pressure ulcer of left heel, unstageable 01/04/2021 No Yes M86.172 Other acute osteomyelitis, left ankle and foot 01/04/2021 No Yes B95.62 Methicillin resistant Staphylococcus aureus infection as the cause of diseases 01/04/2021 No Yes classified elsewhere Inactive Problems Resolved Problems Electronic Signature(s) Signed: 04/30/2021 4:35:26 PM By: Linton Ham MD Entered By: Linton Ham on 04/30/2021 12:05:24 -------------------------------------------------------------------------------- Progress Note Details Patient Name: Date of Service: [name], [name] 04/30/2021 10:30 A M Medical Record Number: [id number] Patient Account Number: [id number] Date of Birth/Sex: Treating RN: 04/01/1932 (88 y.o. [name] Primary Care Provider: Veleta Miners Other Clinician: Referring Provider: Treating Provider/Extender: Carley Hammed in Treatment: 16 Subjective History of Present Illness (HPI) ADMISSION 01/04/2021 Mr. Mencias is a pleasant 88 year old woman who lives at [facility] in the skilled facility. He is accompanied by his wife. I did previously been notified by Dr. Lyndel Safe primary physician about this patient. He apparently fractured his right acetabulum in July. Sometime in the recovery of this developed a pressure ulcer on the right heel. When I was first contacted by Dr. Lyndel Safe this was described as a necrotic wound and indeed this is an accurate description. Unfortunately cultures of this have shown MRSA and after 1 negative x-ray a subsequent x-ray on 01/02/2021 showed changes of osteomyelitis. The patient was started on IV vancomycin at the facility and I believe is  also on oral trimethoprim/sulfamethoxazole. They are using silver alginate which was done at my suggestion. Sedimentation rate and the said facility was 55 CRP P was also elevated at 73.7 compatible with his osteomyelitis Past medical history includes a right acetabular fracture, DVT in the right femoral vein also in September on Eliquis, fairly severe Parkinson's disease. He has had [facility] [place] in the skilled facility. ABI in  our clinic was noncompressible on the left 10/11; patient presents for follow-up. He has no issues or complaints today. He saw Dr. Tommy Medal on 10/7. He is currently on IV vancomycin. He currently denies systemic signs of infection. 10/18 remains on IV vancomycin. Santyl and silver alginate to the wound bed. He is at the [facility] [place]. His wound has made some minor progress there is not as much necrotic tissue and some viable tissue on the surface however there is still an odor. The patient had noninvasive arterial tests done at my suggestion. On the left his ABI was noncompressible his TBI was 0.37 with monophasic and biphasic waveforms. Arterial Doppler showed monophasic waveforms at the ATA and PTA. However there was no focally hemodynamic significant stenosis identified. 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 Remains on IV vancomycin difficult to get consistent answers but it seems like they were not interested in the vascular surgery consult at this time. The wound itself somewhat improved in terms of the amount of necrotic material but still a very difficult necrotic wound. There is exposed bone. We are using Santyl and silver alginate 11/15; he has completed her IV antibiotics and now is on trimethoprim sulfamethoxazole although I did not look over infectious disease notes. The wound is not really improved in size although the condition of the wound surface looks somewhat better there is still exposed bone. We have been  using Santyl and silver alginate being [facility] at [facility] I think it is quite possible the patient has significant PAD however the patient and his wife were not interested in consideration of a vascular referral therefore I have not made this 12/6; 3-week follow-up at their request. The patient completed IV antibiotics and apparently is on Septra DS. He lives in the skilled area of [facility] [place]. We tried to switch him to [facility] last time apparently that is not on their formulary and he was "unaffordable" per the information we got from the facility. Therefore still on Santyl and silver alginate. They are apparently rigorously offloading the heel when in bed. His wife had some questions about walking with physical therapy. As long as this is not putting undue pressure on the Achilles part of his heel where the wound is this should not be a problem 12/28; Still on Septra regular strength twice a day. Apparently this has been extended by Dr. Drucilla Schmidt. We are using Santyl under [facility]. We were never able to get a copy of [facility] formulary. 1/24; 1 month follow-up. Follows with Dr. Drucilla Schmidt in March. Still using Santyl and Hydrofera Blue. Although the wound surface does not look so bad there is extension of this wound superiorly up the back of his Achilles tendon. There is some very firm material which I think is probably calcification rather than bone Objective Constitutional Sitting or standing Blood Pressure is within target range for patient.. Pulse regular and within target range for patient.Marland Kitchen Respirations regular, non-labored and within target range.. Temperature is normal and within the target range for the patient.Marland Kitchen Appears in no distress. Vitals Time Taken: 10:46 AM, Height: 73 in, Weight: 180 lbs, BMI: 23.7, Temperature: 98.4 F, Pulse: 79 bpm, Respiratory Rate: 18 breaths/min, Blood Pressure: 124/71 mmHg. Cardiovascular Pedal pulses are absent. General  Notes: Wound exam; some subcutaneous debris on this surface and around the circumference I did not debride this today. The concerning thing is tunneling superiorly in this wound up the surface of his  Achilles. There is some very firm material which I think is subcutaneous calcification. There is no overt infection although there is significant amount of edema around the wound Integumentary (Hair, Skin) Wound #1 status is Open. Original cause of wound was Blister. The date acquired was: 10/24/2020. The wound has been in treatment 16 weeks. The wound is located on the Left Calcaneus. The wound measures 6.5cm length x 5cm width x 1.2cm depth; 25.525cm^2 area and 30.631cm^3 volume. There is Fat Layer (Subcutaneous Tissue) exposed. There is a large amount of purulent drainage noted. The wound margin is distinct with the outline attached to the wound base. There is large (67-100%) pink, pale granulation within the wound bed. There is a small (1-33%) amount of necrotic tissue within the wound bed including Eschar and Adherent Slough. Assessment Active Problems ICD-10 Pressure ulcer of left heel, unstageable Other acute osteomyelitis, left ankle and foot Methicillin resistant Staphylococcus aureus infection as the cause of diseases classified elsewhere Plan Follow-up Appointments: Return Appointment in 2 weeks. - Dr. Dellia Nims Other: - If possible, send treatment supply formulary so we know what products are available. Bathing/ Shower/ Hygiene: May shower with protection but do not get wound dressing(s) wet. Edema Control - Lymphedema / SCD / Other: Elevate legs to the level of the heart or above for 30 minutes daily and/or when sitting, a frequency of: - throughout the day Exercise regularly - May walk with PT trying to avoid using shoes that apply pressure to heels. Off-Loading: Turn and reposition every 2 hours Other: - Float heels off of bed/chair with pillow under calves Services and Therapies  ordered were: Ankle Brachial Index (ABI) - Refer to Dr. Kennon Holter office for ABI's/TBI's/Doppler's Left Leg WOUND #1: - Calcaneus Wound Laterality: Left Cleanser: Wound Cleanser Every Other Day/30 Days Discharge Instructions: Cleanse the wound with wound cleanser prior to applying a clean dressing using gauze sponges, not tissue or cotton balls. Prim Dressing: Hydrofera Blue Ready Foam, 2.5 x2.5 in Every Other Day/30 Days ary Discharge Instructions: Preferred, may use Silver Alginate if cannot get HFB Prim Dressing: Santyl Ointment Every Other Day/30 Days ary Discharge Instructions: Apply nickel thick amount to wound bed, under silver alginate Secondary Dressing: Woven Gauze Sponge, Non-Sterile 4x4 in Every Other Day/30 Days Discharge Instructions: Apply over primary dressing as directed. Secondary Dressing: ALLEVYN Heel 4 1/2in x 5 1/2in / 10.5cm x 13.5cm Every Other Day/30 Days Discharge Instructions: Apply over primary dressing as directed. Secured With: [employer], 4.5x3.1 (in/yd) Every Other Day/30 Days Discharge Instructions: Secure with Kerlix as directed. Secured With: Paper T ape, 2x10 (in/yd) Every Other Day/30 Days Discharge Instructions: Secure dressing with tape as directed. 1. Osteomyelitis underneath this large wound has been treated. I believe he is still on Septra DS 2. We have always had discussions with the patient about the likely inadequacy of the blood flow to this area. He has not agreed to even noninvasive studies. I discussed this with him today and his wife in great detail I explained that to take the first step which is arterial Dopplers, ABIs and TBI's does not mean he is necessarily going to have to have a procedure although it could mean that. I explained to him that without this it is likely at some point he would be facing an amputation versus hospice care if and when this becomes infected. Fortunately he is not feeling a lot of pain 3. Eventually he  agreed to go forward with the noninvasive studies 4. I did not  change the dressing 5. According the patient his wife they are vigorously offloading [facility] at [facility] using bunny boots at 9. 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 Burnadette Pop, [name] Primary Care Provider: Veleta Miners Other Clinician: Referring Provider: Treating Provider/Extender: Carley Hammed in Treatment: 16 Diagnosis Coding ICD-10 Codes Code Description [phone] Pressure ulcer of left heel, unstageable M86.172 Other acute osteomyelitis, left ankle and foot B95.62 Methicillin resistant Staphylococcus aureus infection as the cause of diseases classified elsewhere Facility Procedures CPT4 Code: [id number] Description: 99213 - WOUND CARE VISIT-LEV 3 EST PT Modifier: Quantity: 1 Physician Procedures : CPT4 Code Description Modifier [id number] - WC PHYS LEVEL 4 - EST PT ICD-10 Diagnosis Description L89.620 Pressure ulcer of left heel, unstageable M86.172 Other acute osteomyelitis, left ankle and foot Quantity: 1 Electronic Signature(s) Signed: 04/30/2021 4:35:26 PM By: Linton Ham MD Entered By: Linton Ham on 04/30/2021 12:10:36

## 2021-05-02 NOTE — Progress Notes (Signed)
Greg [name] ([id number]) , Visit Report for 04/30/2021 Arrival Information Details Patient Name: Date of Service: Greg [name] 04/30/2021 10:30 A M Medical Record Number: [id number] Patient Account Number: [id number] Date of Birth/Sex: Treating RN: 1931/12/16 (88 y.o. Marcheta Grammes Primary Care : Veleta Miners Other Clinician: Referring : Treating /Extender: Carley Hammed in Treatment: 43 Visit Information History Since Last Visit Added or deleted any medications: No Patient Arrived: Wheel Chair Any new allergies or adverse reactions: No Arrival Time: 10:45 Had a fall or experienced change in No Accompanied By: wife activities of daily living that may affect Transfer Assistance: EasyPivot Patient Lift risk of falls: Patient Identification Verified: Yes Signs or symptoms of abuse/neglect since last visito No Secondary Verification Process Completed: Yes Hospitalized since last visit: No Patient Requires Transmission-Based Precautions: No Implantable device outside of the clinic excluding No Patient Has Alerts: Yes cellular tissue based products placed in the center Patient Alerts: Patient on Blood Thinner since last visit: L ABI non compressible Has Dressing in Place as Prescribed: Yes Pain Present Now: No Electronic Signature(s) Signed: 04/30/2021 4:23:30 PM By: Lorrin Jackson Entered By: Lorrin Jackson on 04/30/2021 10:46:07 -------------------------------------------------------------------------------- Clinic Level of Care Assessment Details Patient Name: Date of Service: Greg Schimke GER 04/30/2021 10:30 A M Medical Record Number: [id number] Patient Account Number: [id number] Date of Birth/Sex: Treating RN: [birth year] (88 y.o. Burnadette Pop, [name] Primary Care : Veleta Miners Other Clinician: Referring : Treating /Extender: Carley Hammed in Treatment: 16 Clinic Level of  Care Assessment Items TOOL 4 Quantity Score X- 1 0 Use when only an EandM is performed on FOLLOW-UP visit ASSESSMENTS - Nursing Assessment / Reassessment X- 1 10 Reassessment of Co-morbidities (includes updates in patient status) X- 1 5 Reassessment of Adherence to Treatment Plan ASSESSMENTS - Wound and Skin A ssessment / Reassessment X - Simple Wound Assessment / Reassessment - one wound 1 5 [] - 0 Complex Wound Assessment / Reassessment - multiple wounds [] - 0 Dermatologic / Skin Assessment (not related to wound area) ASSESSMENTS - Focused Assessment X- 1 5 Circumferential Edema Measurements - multi extremities [] - 0 Nutritional Assessment / Counseling / Intervention [] - 0 Lower Extremity Assessment (monofilament, tuning fork, pulses) [] - 0 Peripheral Arterial Disease Assessment (using hand held doppler) ASSESSMENTS - Ostomy and/or Continence Assessment and Care [] - 0 Incontinence Assessment and Management [] - 0 Ostomy Care Assessment and Management (repouching, etc.) PROCESS - Coordination of Care X - Simple Patient / Family Education for ongoing care 1 15 [] - 0 Complex (extensive) Patient / Family Education for ongoing care X- 1 10 Staff obtains Programmer, systems, Records, T Results / Process Orders est X- 1 10 Staff telephones HHA, Nursing Homes / Clarify orders / etc [] - 0 Routine Transfer to another Facility (non-emergent condition) [] - 0 Routine Hospital Admission (non-emergent condition) [] - 0 New Admissions / Biomedical engineer / Ordering NPWT Apligraf, etc. , [] - 0 Emergency Hospital Admission (emergent condition) X- 1 10 Simple Discharge Coordination [] - 0 Complex (extensive) Discharge Coordination PROCESS - Special Needs [] - 0 Pediatric / Minor Patient Management [] - 0 Isolation Patient Management [] - 0 Hearing / Language / Visual special needs [] - 0 Assessment of Community assistance (transportation, D/C planning, etc.) [] -  0 Additional assistance / Altered mentation [] - 0 Support Surface(s) Assessment (bed, cushion, seat, etc.) INTERVENTIONS - Wound Cleansing / Measurement X - Simple Wound Cleansing -  one wound 1 5 [] - 0 Complex Wound Cleansing - multiple wounds X- 1 5 Wound Imaging (photographs - any number of wounds) [] - 0 Wound Tracing (instead of photographs) X- 1 5 Simple Wound Measurement - one wound [] - 0 Complex Wound Measurement - multiple wounds INTERVENTIONS - Wound Dressings [] - 0 Small Wound Dressing one or multiple wounds X- 1 15 Medium Wound Dressing one or multiple wounds [] - 0 Large Wound Dressing one or multiple wounds X- 1 5 Application of Medications - topical [] - 0 Application of Medications - injection INTERVENTIONS - Miscellaneous [] - 0 External ear exam [] - 0 Specimen Collection (cultures, biopsies, blood, body fluids, etc.) [] - 0 Specimen(s) / Culture(s) sent or taken to Lab for analysis [] - 0 Patient Transfer (multiple staff / Civil Service fast streamer / Similar devices) [] - 0 Simple Staple / Suture removal (25 or less) [] - 0 Complex Staple / Suture removal (26 or more) [] - 0 Hypo / Hyperglycemic Management (close monitor of Blood Glucose) [] - 0 Ankle / Brachial Index (ABI) - do not check if billed separately X- 1 5 Vital Signs Has the patient been seen at the hospital within the last three years: Yes Total Score: 110 Level Of Care: New/Established - Level 3 Electronic Signature(s) Signed: 05/02/2021 5:37:29 PM By: Rhae Hammock RN Entered By: Rhae Hammock on 04/30/2021 11:10:31 -------------------------------------------------------------------------------- Encounter Discharge Information Details Patient Name: Date of Service: Greg [name], Greg [name] GER 04/30/2021 10:30 A M Medical Record Number: [id number] Patient Account Number: [id number] Date of Birth/Sex: Treating RN: 1931/11/19 (88 y.o. Burnadette Pop, [name] Primary Care Lamin Chandley: Veleta Miners Other  Clinician: Referring Maryalice Pasley: Treating Janann Boeve/Extender: Carley Hammed in Treatment: 16 Encounter Discharge Information Items Discharge Condition: Stable Ambulatory Status: Wheelchair Discharge Destination: Home Transportation: Private Auto Accompanied By: wife Schedule Follow-up Appointment: Yes Clinical Summary of Care: Patient Declined Electronic Signature(s) Signed: 05/02/2021 5:37:29 PM By: Rhae Hammock RN Entered By: Rhae Hammock on 04/30/2021 11:30:47 -------------------------------------------------------------------------------- Lower Extremity Assessment Details Patient Name: Date of Service: Greg [name], Greg [name] GER 04/30/2021 10:30 A M Medical Record Number: [id number] Patient Account Number: [id number] Date of Birth/Sex: Treating RN: [birth year] (88 y.o. Burnadette Pop, [name] Primary Care Diamone Whistler: Veleta Miners Other Clinician: Referring Thayden Lemire: Treating Suellyn Meenan/Extender: Carley Hammed in Treatment: 16 Edema Assessment Assessed: [name]: Yes] [name]: No] Edema: [Left: Ye] [Right: s] Calf Left: Right: Point of Measurement: 36 cm From Medial Instep 43 cm Ankle Left: Right: Point of Measurement: 11 cm From Medial Instep 228 cm Vascular Assessment Pulses: Dorsalis Pedis Palpable: [Left:Yes] Posterior Tibial Palpable: [Left:Yes] Electronic Signature(s) Signed: 05/02/2021 5:37:29 PM By: Rhae Hammock RN Entered By: Rhae Hammock on 04/30/2021 11:06:11 -------------------------------------------------------------------------------- Multi Wound Chart Details Patient Name: Date of Service: Greg [name], Greg [name] GER 04/30/2021 10:30 A M Medical Record Number: [id number] Patient Account Number: [id number] Date of Birth/Sex: Treating RN: 1931/07/29 (88 y.o. [name] Primary Care Rodriques Badie: Veleta Miners Other Clinician: Referring Lethia Donlon: Treating Tilla Wilborn/Extender: Carley Hammed in  Treatment: 16 Vital Signs Height(in): 73 Pulse(bpm): 70 Weight(lbs): 180 Blood Pressure(mmHg): 124/71 Body Mass Index(BMI): 23.7 Temperature(F): 98.4 Respiratory Rate(breaths/min): 18 Photos: [N/A:N/A] Left Calcaneus N/A N/A Wound Location: Blister N/A N/A Wounding Event: Pressure Ulcer N/A N/A Primary Etiology: Hypertension, Osteoarthritis N/A N/A Comorbid History: 10/24/2020 N/A N/A Date Acquired: 16 N/A N/A Weeks of Treatment: Open N/A N/A Wound Status: No N/A N/A Wound Recurrence: 6.5x5x1.2 N/A N/A Measurements L x W x D (cm) 25.525  N/A N/A A (cm) : rea 30.631 N/A N/A Volume (cm) : 45.80% N/A N/A % Reduction in A rea: -550.10% N/A N/A % Reduction in Volume: Unstageable/Unclassified N/A N/A Classification: Large N/A N/A Exudate A mount: Purulent N/A N/A Exudate Type: yellow, brown, green N/A N/A Exudate Color: Distinct, outline attached N/A N/A Wound Margin: Large (67-100%) N/A N/A Granulation A mount: Pink, Pale N/A N/A Granulation Quality: Small (1-33%) N/A N/A Necrotic A mount: Eschar, Adherent Slough N/A N/A Necrotic Tissue: Fat Layer (Subcutaneous Tissue): Yes N/A N/A Exposed Structures: Fascia: No Tendon: No Muscle: No Joint: No Bone: No Small (1-33%) N/A N/A Epithelialization: Treatment Notes Wound #1 (Calcaneus) Wound Laterality: Left Cleanser Wound Cleanser Discharge Instruction: Cleanse the wound with wound cleanser prior to applying a clean dressing using gauze sponges, not tissue or cotton balls. Peri-Wound Care Topical Primary Dressing Hydrofera Blue Ready Foam, 2.5 x2.5 in Discharge Instruction: Preferred, may use Silver Alginate if cannot get HFB Santyl Ointment Discharge Instruction: Apply nickel thick amount to wound bed, under silver alginate Secondary Dressing Woven Gauze Sponge, Non-Sterile 4x4 in Discharge Instruction: Apply over primary dressing as directed. ALLEVYN Heel 4 1/2in x 5 1/2in / 10.5cm x  13.5cm Discharge Instruction: Apply over primary dressing as directed. Secured With [employer], 4.5x3.1 (in/yd) Discharge Instruction: Secure with Kerlix as directed. Paper Tape, 2x10 (in/yd) Discharge Instruction: Secure dressing with tape as directed. Compression Wrap Compression Stockings Add-Ons Electronic Signature(s) Signed: 04/30/2021 4:35:26 PM By: Linton Ham MD Signed: 04/30/2021 5:46:53 PM By: Levan Hurst RN, BSN Entered By: Linton Ham on 04/30/2021 12:05:30 -------------------------------------------------------------------------------- Multi-Disciplinary Care Plan Details Patient Name: Date of Service: Greg [name], [place] GER 04/30/2021 10:30 A M Medical Record Number: [id number] Patient Account Number: [id number] Date of Birth/Sex: Treating RN: [birth year] (88 y.o. Burnadette Pop, [name] Primary Care Kiyan Burmester: Veleta Miners Other Clinician: Referring Haakon Titsworth: Treating Briggette Najarian/Extender: Carley Hammed in Treatment: [place] reviewed with physician Active Inactive Wound/Skin Impairment Nursing Diagnoses: Impaired tissue integrity Knowledge deficit related to ulceration/compromised skin integrity Goals: Patient/caregiver will verbalize understanding of skin care regimen Date Initiated: 01/04/2021 Date Inactivated: 02/05/2021 Target Resolution Date: 02/01/2021 Goal Status: Met Ulcer/skin breakdown will have a volume reduction of 30% by week 4 Date Initiated: 02/05/2021 Date Inactivated: 04/03/2021 Target Resolution Date: 03/12/2021 Goal Status: Met Ulcer/skin breakdown will have a volume reduction of 50% by week 8 Date Initiated: 04/03/2021 Target Resolution Date: 06/08/2021 Goal Status: Active Interventions: Assess patient/caregiver ability to obtain necessary supplies Assess patient/caregiver ability to perform ulcer/skin care regimen upon admission and as needed Assess ulceration(s) every visit Provide  education on ulcer and skin care Notes: 02/05/21: Wound not yet at 30% volume reduction, patient has infection and receiving IV antibiotics. Electronic Signature(s) Signed: 05/02/2021 5:37:29 PM By: Rhae Hammock RN Entered By: Rhae Hammock on 04/30/2021 11:09:31 -------------------------------------------------------------------------------- Pain Assessment Details Patient Name: Date of Service: Greg [name], Greg [name] GER 04/30/2021 10:30 A M Medical Record Number: [id number] Patient Account Number: [id number] Date of Birth/Sex: Treating RN: [birth year] (88 y.o. Marcheta Grammes Primary Care Brooks Stotz: Veleta Miners Other Clinician: Referring Carlicia Leavens: Treating Reva Pinkley/Extender: Carley Hammed in Treatment: 16 Active Problems Location of Pain Severity and Description of Pain Patient Has Paino No Site Locations Pain Management and Medication Current Pain Management: Electronic Signature(s) Signed: 04/30/2021 4:23:30 PM By: Lorrin Jackson Entered By: Lorrin Jackson on 04/30/2021 10:46:36 -------------------------------------------------------------------------------- Patient/Caregiver Education Details Patient Name: Date of Service: Greg [name], Greg [name] GER 1/24/2023andnbsp10:30 [place] Number: [id number] Patient Account Number:  [id number] Date of Birth/Gender: Treating RN: 09/25/1931 (88 y.o. Burnadette Pop, [place] Primary Care Physician: Veleta Miners Other Clinician: Referring Physician: Treating Physician/Extender: Carley Hammed in Treatment: 16 Education Assessment Education Provided To: Patient Education Topics Provided Wound/Skin Impairment: Methods: Explain/Verbal Responses: Reinforcements needed, State content correctly Electronic Signature(s) Signed: 05/02/2021 5:37:29 PM By: Rhae Hammock RN Entered By: Rhae Hammock on 04/30/2021  11:09:45 -------------------------------------------------------------------------------- Wound Assessment Details Patient Name: Date of Service: Greg [name], Greg [name] GER 04/30/2021 10:30 A M Medical Record Number: [id number] Patient Account Number: [id number] Date of Birth/Sex: Treating RN: Aug 08, 1931 (88 y.o. [name] Primary Care Miyako Oelke: Veleta Miners Other Clinician: Referring Yelina Sarratt: Treating Haydn Hutsell/Extender: Carley Hammed in Treatment: 16 Wound Status Wound Number: 1 Primary Etiology: Pressure Ulcer Wound Location: Left Calcaneus Wound Status: Open Wounding Event: Blister Comorbid History: Hypertension, Osteoarthritis Date Acquired: 10/24/2020 Weeks Of Treatment: 16 Clustered Wound: No Photos Wound Measurements Length: (cm) 6.5 Width: (cm) 5 Depth: (cm) 1.2 Area: (cm) 25.525 Volume: (cm) 30.631 % Reduction in Area: 45.8% % Reduction in Volume: -550.1% Epithelialization: Small (1-33%) Wound Description Classification: Unstageable/Unclassified Wound Margin: Distinct, outline attached Exudate Amount: Large Exudate Type: Purulent Exudate Color: yellow, brown, green Foul Odor After Cleansing: No Slough/Fibrino Yes Wound Bed Granulation Amount: Large (67-100%) Exposed Structure Granulation Quality: Pink, Pale Fascia Exposed: No Necrotic Amount: Small (1-33%) Fat Layer (Subcutaneous Tissue) Exposed: Yes Necrotic Quality: Eschar, Adherent Slough Tendon Exposed: No Muscle Exposed: No Joint Exposed: No Bone Exposed: No Treatment Notes Wound #1 (Calcaneus) Wound Laterality: Left Cleanser Wound Cleanser Discharge Instruction: Cleanse the wound with wound cleanser prior to applying a clean dressing using gauze sponges, not tissue or cotton balls. Peri-Wound Care Topical Primary Dressing Hydrofera Blue Ready Foam, 2.5 x2.5 in Discharge Instruction: Preferred, may use Silver Alginate if cannot get HFB Santyl Ointment Discharge  Instruction: Apply nickel thick amount to wound bed, under silver alginate Secondary Dressing Woven Gauze Sponge, Non-Sterile 4x4 in Discharge Instruction: Apply over primary dressing as directed. ALLEVYN Heel 4 1/2in x 5 1/2in / 10.5cm x 13.5cm Discharge Instruction: Apply over primary dressing as directed. Secured With [employer], 4.5x3.1 (in/yd) Discharge Instruction: Secure with Kerlix as directed. Paper Tape, 2x10 (in/yd) Discharge Instruction: Secure dressing with tape as directed. Compression Wrap Compression Stockings Add-Ons Electronic Signature(s) Signed: 04/30/2021 5:46:53 PM By: Levan Hurst RN, BSN Entered By: Levan Hurst on 04/30/2021 10:50:37 -------------------------------------------------------------------------------- Vitals Details Patient Name: Date of Service: Greg [name], Greg [name] GER 04/30/2021 10:30 A M Medical Record Number: [id number] Patient Account Number: [id number] Date of Birth/Sex: Treating RN: Aug 29, 1931 (88 y.o. Marcheta Grammes Primary Care Raed Schalk: Veleta Miners Other Clinician: Referring Quentin Strebel: Treating Shya Kovatch/Extender: Carley Hammed in Treatment: 16 Vital Signs Time Taken: 10:46 Temperature (F): 98.4 Height (in): 73 Pulse (bpm): 79 Weight (lbs): 180 Respiratory Rate (breaths/min): 18 Body Mass Index (BMI): 23.7 Blood Pressure (mmHg): 124/71 Reference Range: 80 - 120 mg / dl Electronic Signature(s) Signed: 04/30/2021 4:23:30 PM By: Lorrin Jackson Entered By: Lorrin Jackson on 04/30/2021 10:46:28

## 2021-05-03 ENCOUNTER — Non-Acute Institutional Stay (INDEPENDENT_AMBULATORY_CARE_PROVIDER_SITE_OTHER): Payer: Medicare Other | Admitting: Orthopedic Surgery

## 2021-05-03 ENCOUNTER — Encounter: Payer: Self-pay | Admitting: Orthopedic Surgery

## 2021-05-03 DIAGNOSIS — Z0000 Encounter for general adult medical examination without abnormal findings: Secondary | ICD-10-CM | POA: Diagnosis not present

## 2021-05-03 NOTE — Progress Notes (Signed)
Subjective:   Greg [name] is a 88 y.o. male who presents for Medicare Annual/Subsequent preventive examination.  Review of Systems     Cardiac Risk Factors include: advanced age (>71men, >24 women);hypertension;sedentary lifestyle     Objective:    Today's Vitals   05/03/21 1300  BP: 118/69  Pulse: 69  Resp: 20  Temp: 97.8 F (36.6 C)  SpO2: 94%  Weight: 184 lb 6.4 oz (83.6 kg)  Height: 5\' 9"  (1.753 m)   Body mass index is 27.23 kg/m.  Advanced Directives 05/03/2021 04/25/2021 04/15/2021 02/22/2021 02/18/2021 02/11/2021 02/08/2021  Does Patient Have a Medical Advance Directive? Yes Yes Yes Yes Yes Yes Yes  Type of Advance Directive Living will Living will Living will Living will Living will Living will Living will  Does patient want to make changes to medical advance directive? No - Patient declined No - Patient declined No - Patient declined No - Patient declined No - Patient declined No - Patient declined No - Patient declined  Would patient like information on creating a medical advance directive? - - - - - - -    Current Medications (verified) Outpatient Encounter Medications as of 05/03/2021  Medication Sig   Acetaminophen 325 MG CAPS Take 2 capsules by mouth every 6 (six) hours as needed.   albuterol (VENTOLIN HFA) 108 (90 Base) MCG/ACT inhaler Inhale 2 puffs into the lungs every 4 (four) hours as needed for wheezing or shortness of breath.   Amino Acids-Protein Hydrolys (FEEDING SUPPLEMENT, PRO-STAT SUGAR FREE 64,) LIQD Take 30 mLs by mouth in the morning and at bedtime.   apixaban (ELIQUIS) 5 MG TABS tablet Take 1 tablet (5 mg total) by mouth 2 (two) times daily.   Calcium Carbonate-Vitamin D (CALCIUM-VITAMIN D3 PO) Take 600 mg by mouth in the morning.   carbidopa-levodopa (SINEMET CR) 50-200 MG tablet Take 1 tablet by mouth at bedtime.   carbidopa-levodopa (SINEMET IR) 25-100 MG tablet Take 1 tablet by mouth 4 (four) times daily.   collagenase (SANTYL) ointment Apply  1 application topically daily.   ferrous sulfate 325 (65 FE) MG tablet Take 325 mg by mouth. Once A Day on Mon, Wed, Fri   polyethylene glycol (MIRALAX) 17 g packet Take 17 g by mouth daily.   senna (SENOKOT) 8.6 MG TABS tablet Take 1 tablet (8.6 mg total) by mouth daily.   sulfamethoxazole-trimethoprim (BACTRIM) 400-80 MG tablet Take 1 tablet by mouth 2 (two) times daily.   torsemide (DEMADEX) 20 MG tablet Take 1 tablet (20 mg total) by mouth daily.   torsemide (DEMADEX) 20 MG tablet Take 1 tablet (20 mg total) by mouth 2 (two) times a week. Administer on Tuesdays and Thursdays   vitamin B-12 (CYANOCOBALAMIN) 1000 MCG tablet Take 1,000 mcg by mouth daily.   Vitamin D, Cholecalciferol, 25 MCG (1000 UT) TABS Take 25 mcg by mouth daily.   zinc oxide 20 % ointment Apply 1 application topically as needed for irritation.   No facility-administered encounter medications on file as of 05/03/2021.    Allergies (verified) Patient has no known allergies.   History: Past Medical History:  Diagnosis Date   Acute bronchiolitis    Acute osteomyelitis of calcaneum, left ([facility]) 01/11/2021   Anemia    Bursitis of both hips    Hearing reduced    hearing aid   History of basal cell carcinoma    Hyperlipidemia    Malaise and fatigue    MRSA infection 01/11/2021   Osteoarthritis  hand   Osteoporosis    Palpitation    Vitamin D deficiency    Past Surgical History:  Procedure Laterality Date   CATARACT EXTRACTION  05/31/2015   Eye/right   COLONOSCOPY  02/05/2005, 03/06/2005   normal, 10 years ago   MOHS micrographic surgery     Face surgery   ORIF ACETABULAR FRACTURE Right 10/09/2020   Procedure: OPEN REDUCTION INTERNAL FIXATION (ORIF) ACETABULAR FRACTURE;  Surgeon: Altamese Stafford, MD;  Location: [facility];  Service: Orthopedics;  Laterality: Right;   Family History  Problem Relation Age of Onset   Prostate cancer Father    Skin cancer Brother    Social History   Socioeconomic History    Marital status: Married    Spouse name: [name]   Number of children: 2   Years of education: Not on file   Highest education level: Not on file  Occupational History   Occupation: retired    Comment: art professor  Tobacco Use   Smoking status: Never   Smokeless tobacco: Never  Vaping Use   Vaping Use: Never used  Substance and Sexual Activity   Alcohol use: Never   Drug use: Never   Sexual activity: Not on file  Other Topics Concern   Not on file  Social History Narrative   Pt just move from [place] to [place]   Pt leaving with wife   Social Determinants of Health   Financial Resource Strain: Low Risk    Difficulty of Paying Living Expenses: Not hard at all  Food Insecurity: No Food Insecurity   Worried About Charity fundraiser in the Last Year: Never true   Arboriculturist in the Last Year: Never true  Transportation Needs: No Transportation Needs   Lack of Transportation (Medical): No   Lack of Transportation (Non-Medical): No  Physical Activity: Inactive   Days of Exercise per Week: 0 days   Minutes of Exercise per Session: 0 min  Stress: No Stress Concern Present   Feeling of Stress : Not at all  Social Connections: Moderately Integrated   Frequency of Communication with Friends and Family: More than three times a week   Frequency of Social Gatherings with Friends and Family: More than three times a week   Attends Religious Services: 1 to 4 times per year   Active Member of [employer] or Organizations: No   Attends Music therapist: Never   Marital Status: Married    Tobacco Counseling Counseling given: Not Answered   Clinical Intake:  Pre-visit preparation completed: Yes  Pain : No/denies pain     BMI - recorded: 27.23 Nutritional Status: BMI 25 -29 Overweight Nutritional Risks: Unintentional weight gain (heart failure) Diabetes: No  How often do you need to have someone help you when you read instructions, pamphlets, or other written  materials from your doctor or pharmacy?: 4 - Often What is the last grade level you completed in school?: double masters degrees  Diabetic?No  Interpreter Needed?: No      Activities of Daily Living In your present state of health, do you have any difficulty performing the following activities: 05/03/2021 10/09/2020  Hearing? N Y  Vision? N N  Difficulty concentrating or making decisions? N N  Walking or climbing stairs? Y N  Dressing or bathing? Y N  Doing errands, shopping? Y -  Conservation officer, nature and eating ? Y -  Using the Toilet? Y -  In the past six months, have you accidently leaked  urine? Y -  Do you have problems with loss of bowel control? N -  Managing your Medications? Y -  Managing your Finances? Y -  Housekeeping or managing your Housekeeping? Y -    Patient Care Team: Virgie Dad, MD as PCP - General (Internal Medicine) [name], Eustace Quail, DO as Consulting Physician (Neurology)  Indicate any recent Medical Services you may have received from other than [facility] providers in the past year (date may be approximate).     Assessment:   This is a routine wellness examination for Lonny.  Hearing/Vision screen No results found.  Dietary issues and exercise activities discussed: Current Exercise Habits: The patient does not participate in regular exercise at present, Exercise limited by: neurologic condition(s);orthopedic condition(s);Other - see comments (left heel osteomyelitis, parkinsons)   Goals Addressed             This Visit's Progress    Maintain Mobility and Function   On track    Evidence-based guidance:  Acknowledge and validate impact of pain, loss of strength and potential disfigurement (hand osteoarthritis) on mental health and daily life, such as social isolation, anxiety, depression, impaired sexual relationship and   injury from falls.  Anticipate referral to physical or occupational therapy for assessment, therapeutic exercise and recommendation  for adaptive equipment or assistive devices; encourage participation.  Assess impact on ability to perform activities of daily living, as well as engage in sports and leisure events or requirements of work or school.  Provide anticipatory guidance and reassurance about the benefit of exercise to maintain function; acknowledge and normalize fear that exercise may worsen symptoms.  Encourage regular exercise, at least 10 minutes at a time for 45 minutes per week; consider yoga, water exercise and proprioceptive exercises; encourage use of wearable activity tracker to increase motivation and adherence.  Encourage maintenance or resumption of daily activities, including employment, as pain allows and with minimal exposure to trauma.  Assist patient to advocate for adaptations to the work environment.  Consider level of pain and function, gender, age, lifestyle, patient preference, quality of life, readiness and capacity to benefit when recommending patients for orthopaedic surgery consultation.  Explore strategies, such as changes to medication regimen or activity that enables patient to anticipate and manage flare-ups that increase deconditioning and disability.  Explore patient preferences; encourage exposure to a broader range of activities that have been avoided for fear of experiencing pain.  Identify barriers to participation in therapy or exercise, such as pain with activity, anticipated or imagined pain.  Monitor postoperative joint replacement or any preexisting joint replacement for ongoing pain and loss of function; provide social support and encouragement throughout recovery.   Notes:        Depression Screen PHQ 2/9 Scores 03/22/2021  PHQ - 2 Score 0    Fall Risk Fall Risk  05/03/2021 03/22/2021 01/10/2021 09/26/2020  Falls in the past year? 1 0 1 0  Number falls in past yr: 0 0 0 0  Injury with Fall? 1 0 1 0  Risk for fall due to : History of fall(s);Impaired  balance/gait;Impaired mobility - - -  Risk for fall due to: Comment Parkinson's, past hip fracture - - -  Follow up Falls evaluation completed;Education provided;Falls prevention discussed - - -    FALL RISK PREVENTION PERTAINING TO THE HOME:  Any stairs in or around the home? No  If so, are there any without handrails? No  Home free of loose throw rugs in walkways, pet beds,  electrical cords, etc? Yes  Adequate lighting in your home to reduce risk of falls? Yes   ASSISTIVE DEVICES UTILIZED TO PREVENT FALLS:  Life alert? No  Use of a cane, walker or w/c? Yes  Grab bars in the bathroom? Yes  Shower chair or bench in shower? Yes  Elevated toilet seat or a handicapped toilet? Yes   TIMED UP AND GO:  Was the test performed? No .  Length of time to ambulate 10 feet: N/A sec.   Gait slow and steady with assistive device  Cognitive Function: MMSE - Mini Mental State Exam 05/03/2021  Not completed: Refused     6CIT Screen 05/03/2021  What Year? 0 points  What month? 0 points  What time? 0 points  Count back from 20 0 points  Months in reverse 0 points  Repeat phrase 0 points  Total Score 0    Immunizations Immunization History  Administered Date(s) Administered   H1N1 06/13/2008   Influenza Split 03/05/2006, 02/26/2007, 01/26/2008, 01/18/2009, 12/12/2009, 01/16/2011, 01/29/2012, 02/01/2013, 02/08/2014, 02/13/2015, 01/10/2016, 02/23/2017, 02/24/2018, 01/24/2019, 12/22/2019   Influenza, High Dose Seasonal PF 01/30/2021   PFIZER(Purple Top)SARS-COV-2 Vaccination 05/04/2019, 05/25/2019, 09/01/2020   Pfizer Covid-19 Vaccine Bivalent Booster 61yrs & up 12/27/2019, 12/26/2020   Pneumococcal Conjugate-13 02/10/2014   Pneumococcal Polysaccharide-23 06/10/2011   Zoster, Live 07/04/2009    TDAP status: Due, Education has been provided regarding the importance of this vaccine. Advised may receive this vaccine at local pharmacy or Health Dept. Aware to provide a copy of the  vaccination record if obtained from local pharmacy or Health Dept. Verbalized acceptance and understanding.  Flu Vaccine status: Up to date  Pneumococcal vaccine status: Up to date  Covid-19 vaccine status: Completed vaccines  Qualifies for Shingles Vaccine? Yes   Zostavax completed Yes   Shingrix Completed?: No.    Education has been provided regarding the importance of this vaccine. Patient has been advised to call insurance company to determine out of pocket expense if they have not yet received this vaccine. Advised may also receive vaccine at local pharmacy or Health Dept. Verbalized acceptance and understanding.  Screening Tests Health Maintenance  Topic Date Due   Zoster Vaccines- Shingrix (1 of 2) 05/03/2021 (Originally 04/24/1981)   TETANUS/TDAP  01/31/2022 (Originally 04/23/1950)   Pneumonia Vaccine 67+ Years old  Completed   INFLUENZA VACCINE  Completed   COVID-19 Vaccine  Completed   HPV VACCINES  Aged Out    Health Maintenance  There are no preventive care reminders to display for this patient.  Colorectal cancer screening: No longer required.   Lung Cancer Screening: (Low Dose CT Chest recommended if Age 47-80 years, 30 pack-year currently smoking OR have quit w/in 15years.) does not qualify.   Lung Cancer Screening Referral: No  Additional Screening:  Hepatitis C Screening: does not qualify; advanced age  Vision Screening: Recommended annual ophthalmology exams for early detection of glaucoma and other disorders of the eye. Is the patient up to date with their annual eye exam?  No  Who is the provider or what is the name of the office in which the patient attends annual eye exams? Cannot recall If pt is not established with a provider, would they like to be referred to a provider to establish care? No .   Dental Screening: Recommended annual dental exams for proper oral hygiene  Community Resource Referral / Chronic Care Management: CRR required this visit?   No   CCM required this visit?  No  Plan:     I have personally reviewed and noted the following in the patients chart:   Medical and social history Use of alcohol, tobacco or illicit drugs  Current medications and supplements including opioid prescriptions. Patient is not currently taking opioid prescriptions. Functional ability and status Nutritional status Physical activity Advanced directives List of other physicians Hospitalizations, surgeries, and ER visits in previous 12 months Vitals Screenings to include cognitive, depression, and falls Referrals and appointments  In addition, I have reviewed and discussed with patient certain preventive protocols, quality metrics, and best practice recommendations. A written personalized care plan for preventive services as well as general preventive health recommendations were provided to patient.     Yvonna Alanis, NP   05/03/2021   Consider tetanus and Shingrix vaccine (newer shingles vaccine)

## 2021-05-03 NOTE — Patient Instructions (Signed)
°  Greg [name] , Thank you for taking time to come for your Medicare Wellness Visit. I appreciate your ongoing commitment to your health goals. Please review the following plan we discussed and let me know if I can assist you in the future.   These are the goals we discussed:  Goals      Maintain Mobility and Function     Evidence-based guidance:  Acknowledge and validate impact of pain, loss of strength and potential disfigurement (hand osteoarthritis) on mental health and daily life, such as social isolation, anxiety, depression, impaired sexual relationship and   injury from falls.  Anticipate referral to physical or occupational therapy for assessment, therapeutic exercise and recommendation for adaptive equipment or assistive devices; encourage participation.  Assess impact on ability to perform activities of daily living, as well as engage in sports and leisure events or requirements of work or school.  Provide anticipatory guidance and reassurance about the benefit of exercise to maintain function; acknowledge and normalize fear that exercise may worsen symptoms.  Encourage regular exercise, at least 10 minutes at a time for 45 minutes per week; consider yoga, water exercise and proprioceptive exercises; encourage use of wearable activity tracker to increase motivation and adherence.  Encourage maintenance or resumption of daily activities, including employment, as pain allows and with minimal exposure to trauma.  Assist patient to advocate for adaptations to the work environment.  Consider level of pain and function, gender, age, lifestyle, patient preference, quality of life, readiness and capacity to benefit when recommending patients for orthopaedic surgery consultation.  Explore strategies, such as changes to medication regimen or activity that enables patient to anticipate and manage flare-ups that increase deconditioning and disability.  Explore patient preferences; encourage  exposure to a broader range of activities that have been avoided for fear of experiencing pain.  Identify barriers to participation in therapy or exercise, such as pain with activity, anticipated or imagined pain.  Monitor postoperative joint replacement or any preexisting joint replacement for ongoing pain and loss of function; provide social support and encouragement throughout recovery.   Notes:         This is a list of the screening recommended for you and due dates:  Health Maintenance  Topic Date Due   Zoster (Shingles) Vaccine (1 of 2) 05/03/2021*   Tetanus Vaccine  01/31/2022*   Pneumonia Vaccine  Completed   Flu Shot  Completed   COVID-19 Vaccine  Completed   HPV Vaccine  Aged Out  *Topic was postponed. The date shown is not the original due date.

## 2021-05-07 ENCOUNTER — Other Ambulatory Visit (HOSPITAL_COMMUNITY): Payer: Self-pay | Admitting: Internal Medicine

## 2021-05-07 DIAGNOSIS — I700 Atherosclerosis of aorta: Secondary | ICD-10-CM

## 2021-05-07 DIAGNOSIS — L89620 Pressure ulcer of left heel, unstageable: Secondary | ICD-10-CM

## 2021-05-08 ENCOUNTER — Ambulatory Visit (HOSPITAL_COMMUNITY)
Admission: RE | Admit: 2021-05-08 | Discharge: 2021-05-08 | Disposition: A | Discharge status: Home / Self Care | Payer: Medicare Other | Source: Ambulatory Visit | Attending: Cardiovascular Disease | Admitting: Cardiovascular Disease

## 2021-05-08 ENCOUNTER — Other Ambulatory Visit: Payer: Self-pay

## 2021-05-08 DIAGNOSIS — L89620 Pressure ulcer of left heel, unstageable: Secondary | ICD-10-CM | POA: Diagnosis present

## 2021-05-08 DIAGNOSIS — I700 Atherosclerosis of aorta: Secondary | ICD-10-CM | POA: Diagnosis present

## 2021-05-14 ENCOUNTER — Encounter (HOSPITAL_BASED_OUTPATIENT_CLINIC_OR_DEPARTMENT_OTHER): Payer: Medicare Other | Attending: Internal Medicine | Admitting: Internal Medicine

## 2021-05-14 ENCOUNTER — Other Ambulatory Visit: Payer: Self-pay

## 2021-05-14 DIAGNOSIS — L89620 Pressure ulcer of left heel, unstageable: Secondary | ICD-10-CM | POA: Insufficient documentation

## 2021-05-14 DIAGNOSIS — I70245 Atherosclerosis of native arteries of left leg with ulceration of other part of foot: Secondary | ICD-10-CM | POA: Diagnosis not present

## 2021-05-14 DIAGNOSIS — M86172 Other acute osteomyelitis, left ankle and foot: Secondary | ICD-10-CM | POA: Diagnosis not present

## 2021-05-14 DIAGNOSIS — G20 Parkinson's disease: Secondary | ICD-10-CM | POA: Diagnosis not present

## 2021-05-14 DIAGNOSIS — Z86718 Personal history of other venous thrombosis and embolism: Secondary | ICD-10-CM | POA: Insufficient documentation

## 2021-05-14 DIAGNOSIS — Z7901 Long term (current) use of anticoagulants: Secondary | ICD-10-CM | POA: Diagnosis not present

## 2021-05-14 DIAGNOSIS — B9562 Methicillin resistant Staphylococcus aureus infection as the cause of diseases classified elsewhere: Secondary | ICD-10-CM | POA: Insufficient documentation

## 2021-05-14 NOTE — Progress Notes (Addendum)
OMARII [name] ([id number]) , Visit Report for 05/14/2021 Arrival Information Details Patient Name: Date of Service: Greg [name] [facility] 05/14/2021 10:30 A M Medical Record Number: [id number] Patient Account Number: [id number] Date of Birth/Sex: Treating RN: April 30, 1931 (88 y.o. Greg [name] Primary Care Greg [name]: Greg [name] Other Clinician: Referring Greg [name]: Treating Greg [name]/Extender: Greg [name] in Treatment: 18 Visit Information History Since Last Visit Added or deleted any medications: No Patient Arrived: Wheel Chair Any new allergies or adverse reactions: No Arrival Time: 11:17 Had a fall or experienced change in No Accompanied By: spouse activities of daily living that may affect Transfer Assistance: Manual risk of falls: Patient Requires Transmission-Based Precautions: No Signs or symptoms of abuse/neglect since last visito No Patient Has Alerts: Yes Hospitalized since last visit: No Patient Alerts: Patient on Blood Thinner Implantable device outside of the clinic excluding No L ABI non compressible cellular tissue based products placed in the center since last visit: Has Dressing in Place as Prescribed: Yes Pain Present Now: No Electronic Signature(s) Signed: 05/14/2021 6:15:06 PM By: Dellie Catholic RN Entered By: Dellie Catholic on 05/14/2021 11:19:48 -------------------------------------------------------------------------------- Clinic Level of Care Assessment Details Patient Name: Date of Service: Greg [name] GER 05/14/2021 10:30 A M Medical Record Number: [id number] Patient Account Number: [id number] Date of Birth/Sex: Treating RN: [birth year] (88 y.o. Greg [name] Primary Care Greg [name]: Greg [name] Other Clinician: Referring Greg [name]: Treating Greg [name]/Extender: Greg [name] in Treatment: 18 Clinic Level of Care Assessment Items TOOL 1 Quantity Score X- 1 0 Use when EandM and Procedure is performed  on INITIAL visit ASSESSMENTS - Nursing Assessment / Reassessment X- 1 20 General Physical Exam (combine w/ comprehensive assessment (listed just below) when performed on new pt. evals) X- 1 25 Comprehensive Assessment (HX, ROS, Risk Assessments, Wounds Hx, etc.) ASSESSMENTS - Wound and Skin Assessment / Reassessment X- 1 10 Dermatologic / Skin Assessment (not related to wound area) ASSESSMENTS - Ostomy and/or Continence Assessment and Care '[]'  - 0 Incontinence Assessment and Management '[]'  - 0 Ostomy Care Assessment and Management (repouching, etc.) PROCESS - Coordination of Care X - Simple Patient / Family Education for ongoing care 1 15 '[]'  - 0 Complex (extensive) Patient / Family Education for ongoing care X- 1 10 Staff obtains Programmer, systems, Records, T Results / Process Orders est '[]'  - 0 Staff telephones HHA, Nursing Homes / Clarify orders / etc '[]'  - 0 Routine Transfer to another Facility (non-emergent condition) '[]'  - 0 Routine Hospital Admission (non-emergent condition) '[]'  - 0 New Admissions / Biomedical engineer / Ordering NPWT Apligraf, etc. , '[]'  - 0 Emergency Hospital Admission (emergent condition) PROCESS - Special Needs '[]'  - 0 Pediatric / Minor Patient Management '[]'  - 0 Isolation Patient Management '[]'  - 0 Hearing / Language / Visual special needs '[]'  - 0 Assessment of Community assistance (transportation, D/C planning, etc.) '[]'  - 0 Additional assistance / Altered mentation '[]'  - 0 Support Surface(s) Assessment (bed, cushion, seat, etc.) INTERVENTIONS - Miscellaneous '[]'  - 0 External ear exam '[]'  - 0 Patient Transfer (multiple staff / Civil Service fast streamer / Similar devices) '[]'  - 0 Simple Staple / Suture removal (25 or less) '[]'  - 0 Complex Staple / Suture removal (26 or more) '[]'  - 0 Hypo/Hyperglycemic Management (do not check if billed separately) '[]'  - 0 Ankle / Brachial Index (ABI) - do not check if billed separately Has the patient been seen at the hospital within  the last three years: Yes Total Score: 80 Level Of Care:  New/Established - Level 3 Electronic Signature(s) Signed: 05/14/2021 6:15:06 PM By: Dellie Catholic RN Entered By: Dellie Catholic on 05/14/2021 18:10:48 -------------------------------------------------------------------------------- Encounter Discharge Information Details Patient Name: Date of Service: Greg [name], [name] K. 05/14/2021 10:30 A M Medical Record Number: [id number] Patient Account Number: [id number] Date of Birth/Sex: Treating RN: [birth year] (88 y.o. Greg [name] Primary Care Greg [name]: Greg [name] Other Clinician: Referring Greg [name]: Treating Greg [name]/Extender: Greg [name] in Treatment: 18 Encounter Discharge Information Items Discharge Condition: Stable Ambulatory Status: Wheelchair Discharge Destination: [place] Telephoned: No Orders Sent: No Transportation: Other Accompanied By: spouse Schedule Follow-up Appointment: Yes Clinical Summary of Care: Patient Declined Electronic Signature(s) Signed: 06/12/2021 5:15:38 PM By: Greg [name] Previous Signature: 05/14/2021 6:15:06 PM Version By: Dellie Catholic RN Previous Signature: 05/14/2021 6:15:06 PM Version By: Dellie Catholic RN Entered By: Greg [name] on 06/12/2021 17:15:37 -------------------------------------------------------------------------------- Lower Extremity Assessment Details Patient Name: Date of Service: Greg [name], [name] 05/14/2021 10:30 A M Medical Record Number: [id number] Patient Account Number: [id number] Date of Birth/Sex: Treating RN: 01/13/1932 (88 y.o. Greg [name] Primary Care Bevin Das: Greg [name] Other Clinician: Referring Ngina Royer: Treating Karri Kallenbach/Extender: Greg [name] in Treatment: 18 Edema Assessment Assessed: Greg [name]: No] Greg [name]: No] Edema: [Left: Ye] [Right: s] Calf Left: Right: Point of Measurement: 36 cm From Medial Instep 43 cm Ankle Left:  Right: Point of Measurement: 11 cm From Medial Instep 26 cm Electronic Signature(s) Signed: 05/14/2021 6:15:06 PM By: Dellie Catholic RN Entered By: Dellie Catholic on 05/14/2021 11:39:46 -------------------------------------------------------------------------------- Multi Wound Chart Details Patient Name: Date of Service: Greg [name], [name] 05/14/2021 10:30 A M Medical Record Number: [id number] Patient Account Number: [id number] Date of Birth/Sex: Treating RN: December 11, 1931 (88 y.o. [name] Primary Care Amanda Pote: Greg [name] Other Clinician: Referring Evalyne Cortopassi: Treating Lynford Espinoza/Extender: Greg [name] in Treatment: 18 Vital Signs Height(in): 73 Pulse(bpm): 86 Weight(lbs): 180 Blood Pressure(mmHg): 132/63 Body Mass Index(BMI): 23.7 Temperature(F): 97.9 Respiratory Rate(breaths/min): 18 Photos: [N/A:N/A] Left Calcaneus N/A N/A Wound Location: Blister N/A N/A Wounding Event: Pressure Ulcer N/A N/A Primary Etiology: Hypertension, Osteoarthritis N/A N/A Comorbid History: 10/24/2020 N/A N/A Date Acquired: 4 N/A N/A Weeks of Treatment: Open N/A N/A Wound Status: No N/A N/A Wound Recurrence: 5.5x5.8x0.6 N/A N/A Measurements L x W x D (cm) 25.054 N/A N/A A (cm) : rea 15.033 N/A N/A Volume (cm) : 46.80% N/A N/A % Reduction in A rea: -219.00% N/A N/A % Reduction in Volume: 11 Starting Position 1 (o'clock): 12 Ending Position 1 (o'clock): 0.3 Maximum Distance 1 (cm): 4 Starting Position 2 (o'clock): 6 Ending Position 2 (o'clock): 0.3 Maximum Distance 2 (cm): Yes N/A N/A Undermining: Unstageable/Unclassified N/A N/A Classification: Large N/A N/A Exudate A mount: Purulent N/A N/A Exudate Type: yellow, brown, green N/A N/A Exudate Color: Distinct, outline attached N/A N/A Wound Margin: Large (67-100%) N/A N/A Granulation A mount: Pink, Pale N/A N/A Granulation Quality: Small (1-33%) N/A N/A Necrotic A mount: Eschar,  Adherent Slough N/A N/A Necrotic Tissue: Fat Layer (Subcutaneous Tissue): Yes N/A N/A Exposed Structures: Fascia: No Tendon: No Muscle: No Joint: No Bone: No Small (1-33%) N/A N/A Epithelialization: Treatment Notes Electronic Signature(s) Signed: 05/14/2021 5:02:53 PM By: Linton Ham MD Signed: 05/14/2021 6:14:04 PM By: Levan Hurst RN, BSN Entered By: Linton Ham on 05/14/2021 12:44:18 -------------------------------------------------------------------------------- Multi-Disciplinary Care Plan Details Patient Name: Date of Service: Greg [name], [name] 05/14/2021 10:30 A M Medical Record Number: [id number] Patient Account Number: [id number] Date of Birth/Sex: Treating RN: [birth year] (88 y.o. M) [place],  Mechele Claude Primary Care Millie Forde: Greg [name] Other Clinician: Referring Shantal Roan: Treating Nakea Gouger/Extender: Greg [name] in Treatment: [place] reviewed with physician Active Inactive Wound/Skin Impairment Nursing Diagnoses: Impaired tissue integrity Knowledge deficit related to ulceration/compromised skin integrity Goals: Patient/caregiver will verbalize understanding of skin care regimen Date Initiated: 01/04/2021 Date Inactivated: 02/05/2021 Target Resolution Date: 02/01/2021 Goal Status: Met Ulcer/skin breakdown will have a volume reduction of 30% by week 4 Date Initiated: 02/05/2021 Date Inactivated: 04/03/2021 Target Resolution Date: 03/12/2021 Goal Status: Met Ulcer/skin breakdown will have a volume reduction of 50% by week 8 Date Initiated: 04/03/2021 Target Resolution Date: 06/08/2021 Goal Status: Active Interventions: Assess patient/caregiver ability to obtain necessary supplies Assess patient/caregiver ability to perform ulcer/skin care regimen upon admission and as needed Assess ulceration(s) every visit Provide education on ulcer and skin care Notes: 02/05/21: Wound not yet at 30% volume reduction, patient has  infection and receiving IV antibiotics. Electronic Signature(s) Signed: 05/14/2021 6:15:06 PM By: Dellie Catholic RN Entered By: Dellie Catholic on 05/14/2021 18:07:53 -------------------------------------------------------------------------------- Pain Assessment Details Patient Name: Date of Service: Greg [name] GER 05/14/2021 10:30 A M Medical Record Number: [id number] Patient Account Number: [id number] Date of Birth/Sex: Treating RN: 09/02/1931 (88 y.o. Greg [name] Primary Care Nicoya Friel: Greg [name] Other Clinician: Referring Kayvion Arneson: Treating Casmir Auguste/Extender: Greg [name] in Treatment: 18 Active Problems Location of Pain Severity and Description of Pain Patient Has Paino No Site Locations Pain Management and Medication Current Pain Management: Electronic Signature(s) Signed: 05/14/2021 6:15:06 PM By: Dellie Catholic RN Entered By: Dellie Catholic on 05/14/2021 11:22:25 -------------------------------------------------------------------------------- Patient/Caregiver Education Details Patient Name: Date of Service: Greg [name], [name] 2/7/2023andnbsp10:30 A M Medical Record Number: [id number] Patient Account Number: [id number] Date of Birth/Gender: Treating RN: [birth year] (88 y.o. Greg [name] Primary Care Physician: Greg [name] Other Clinician: Referring Physician: Treating Physician/Extender: Greg [name] in Treatment: 18 Education Assessment Education Provided To: Patient Education Topics Provided Wound/Skin Impairment: Methods: Explain/Verbal Responses: Return demonstration correctly Electronic Signature(s) Signed: 05/14/2021 6:15:06 PM By: Dellie Catholic RN Entered By: Dellie Catholic on 05/14/2021 18:08:35 -------------------------------------------------------------------------------- Wound Assessment Details Patient Name: Date of Service: Greg [name], [name] 05/14/2021 10:30 A M Medical Record  Number: [id number] Patient Account Number: [id number] Date of Birth/Sex: Treating RN: 1931-05-03 (88 y.o. Greg [name] Primary Care Sylvan Lahm: Greg [name] Other Clinician: Referring Keli Buehner: Treating Kaniya Trueheart/Extender: Greg [name] in Treatment: 18 Wound Status Wound Number: 1 Primary Etiology: Pressure Ulcer Wound Location: Left Calcaneus Wound Status: Open Wounding Event: Blister Comorbid History: Hypertension, Osteoarthritis Date Acquired: 10/24/2020 Weeks Of Treatment: 18 Clustered Wound: No Photos Wound Measurements Length: (cm) 5.5 Width: (cm) 5.8 Depth: (cm) 0.6 Area: (cm) 25.054 Volume: (cm) 15.033 % Reduction in Area: 46.8% % Reduction in Volume: -219% Epithelialization: Small (1-33%) Tunneling: No Undermining: Yes Location 1 Starting Position (o'clock): 11 Ending Position (o'clock): 12 Maximum Distance: (cm) 0.3 Location 2 Starting Position (o'clock): 4 Ending Position (o'clock): 6 Maximum Distance: (cm) 0.3 Wound Description Classification: Unstageable/Unclassified Wound Margin: Distinct, outline attached Exudate Amount: Large Exudate Type: Purulent Exudate Color: yellow, brown, green Foul Odor After Cleansing: No Slough/Fibrino Yes Wound Bed Granulation Amount: Large (67-100%) Exposed Structure Granulation Quality: Pink, Pale Fascia Exposed: No Necrotic Amount: Small (1-33%) Fat Layer (Subcutaneous Tissue) Exposed: Yes Necrotic Quality: Eschar, Adherent Slough Tendon Exposed: No Muscle Exposed: No Joint Exposed: No Bone Exposed: No Electronic Signature(s) Signed: 05/14/2021 6:15:06 PM By: Dellie Catholic RN Entered By: Dellie Catholic on 05/14/2021 11:43:28 -------------------------------------------------------------------------------- Vitals  Details Patient Name: Date of Service: Greg [name] GER 05/14/2021 10:30 A M Medical Record Number: [id number] Patient Account Number: [id number] Date of  Birth/Sex: Treating RN: [birth year] (88 y.o. Greg [name] Primary Care Mylei Brackeen: Greg [name] Other Clinician: Referring Rondi Ivy: Treating Pharoah Goggins/Extender: Greg [name] in Treatment: 18 Vital Signs Time Taken: 11:20 Temperature (F): 97.9 Height (in): 73 Pulse (bpm): 86 Weight (lbs): 180 Respiratory Rate (breaths/min): 18 Body Mass Index (BMI): 23.7 Blood Pressure (mmHg): 132/63 Reference Range: 80 - 120 mg / dl Electronic Signature(s) Signed: 05/14/2021 6:15:06 PM By: Dellie Catholic RN Entered By: Dellie Catholic on 05/14/2021 11:20:53

## 2021-05-15 NOTE — Progress Notes (Signed)
Greg [name] ([id number]) , Visit Report for 05/14/2021 HPI Details Patient Name: Date of Service: [facility] [name] [facility] 05/14/2021 10:30 A M Medical Record Number: [id number] Patient Account Number: [id number] Date of Birth/Sex: Treating RN: 07-17-1931 (88 y.o. Greg [name] Primary Care Provider: Veleta [name] Other Clinician: Referring Provider: Treating Provider/Extender: [name] in Treatment: 18 History of Present Illness HPI Description: ADMISSION 01/04/2021 Greg [name] is a pleasant 88 year old woman who lives at [facility] in the skilled facility. He is accompanied by his wife. I did previously been notified by Dr. Lyndel Safe primary physician about this patient. He apparently fractured his right acetabulum in July. Sometime in the recovery of this developed a pressure ulcer on the right heel. When I was first contacted by Dr. Lyndel Safe this was described as a necrotic wound and indeed this is an accurate description. Unfortunately cultures of this have shown MRSA and after 1 negative x-ray a subsequent x-ray on 01/02/2021 showed changes of osteomyelitis. The patient was started on IV vancomycin at the facility and I believe is also on oral trimethoprim/sulfamethoxazole. They are using silver alginate which was done at my suggestion. Sedimentation rate and the said facility was 55 CRP P was also elevated at 73.7 compatible with his osteomyelitis Past medical history includes a right acetabular fracture, DVT in the right femoral vein also in September on Eliquis, fairly severe Parkinson's disease. He has had [facility] in the skilled facility. ABI in our clinic was noncompressible on the left 10/11; patient presents for follow-up. He has no issues or complaints today. He saw Dr. Tommy Medal on 10/7. He is currently on IV vancomycin. He currently denies systemic signs of infection. 10/18 remains on IV vancomycin. Santyl and silver alginate to the wound bed. He  [facility] [place]. His wound has made some minor progress there is not as much necrotic tissue and some viable tissue on the surface however there is still an odor. The patient had noninvasive arterial tests done at my suggestion. On the left his ABI was noncompressible his TBI was 0.37 with monophasic and biphasic waveforms. Arterial Doppler showed monophasic waveforms at the ATA and PTA. However there was no focally hemodynamic significant stenosis identified. 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 Remains on IV vancomycin difficult to get consistent answers but it seems like they were not interested in the vascular surgery consult at this time. The wound itself somewhat improved in terms of the amount of necrotic material but still a very difficult necrotic wound. There is exposed bone. We are using Santyl and silver alginate 11/15; he has completed her IV antibiotics and now is on trimethoprim sulfamethoxazole although I did not look over infectious disease notes. The wound is not really improved in size although the condition of the wound surface looks somewhat better there is still exposed bone. We have been using Santyl and silver alginate [facility] I think it is quite possible the patient has significant PAD however the patient and his wife were not interested in consideration of a vascular referral therefore I have not made this 12/6; 3-week follow-up at their request. The patient completed IV antibiotics and apparently is on Septra DS. He lives in the skilled area of [facility] [place]. We tried to switch him to [facility] last time apparently that is not on their formulary and he was "unaffordable" per the information we got from the facility. Therefore  still on Santyl and silver alginate. They are apparently rigorously offloading the heel when in bed. His wife had some questions about walking with physical  therapy. As long as this is not putting undue pressure on the Achilles part of his heel where the wound is this should not be a problem 12/28; Still on Septra regular strength twice a day. Apparently this has been extended by Dr. Drucilla Schmidt. We are using Santyl under [facility]. We were never able to get a copy of [facility] formulary. 1/24; 1 month follow-up. Follows with Dr. Drucilla Schmidt in March. Still using Santyl and Hydrofera Blue. Although the wound surface does not look so bad there is extension of this wound superiorly up the back of his Achilles tendon. There is some very firm material which I think is probably calcification rather than bone. 2/7; 2-week follow-up. Reviewed using Santyl and Hydrofera Blue for a prolonged period. 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 Peroneal artery in the mid aspect was not visualized. Both the patient and his wife previously had refused any suggestion of an aggressive approach to the vascular issue however I do not think we are going to heal this wound if the blood flow is obviously inadequate. I explained to him the neck step would be a consultation to a vascular interventional list for consideration of angiography and he wants to proceed with this as does his wife. The wound is stalled. We made considerable improvement with the obvious infection when he first came in he has been treated for osteomyelitis and we have managed to get necrotic tissue off the surface of this however not making any further improvement. Electronic Signature(s) Signed: 05/14/2021 5:02:53 PM By: Linton Ham MD Entered By: Linton Ham on 05/14/2021 12:47:38 -------------------------------------------------------------------------------- Physical Exam Details Patient Name: Date of Service: [name], [name] 05/14/2021 10:30 A M Medical Record Number:  [id number] Patient Account Number: [id number] Date of Birth/Sex: Treating RN: 1931/09/12 (88 y.o. [facility] [name] Primary Care Provider: Veleta [name] Other Clinician: Referring Provider: Treating Provider/Extender: [name] in Treatment: 18 Constitutional Sitting or standing Blood Pressure is within target range for patient.. Pulse regular and within target range for patient.Marland Kitchen Respirations regular, non-labored and within target range.. Temperature is normal and within the target range for the patient.Marland Kitchen Appears in no distress. Notes Wound exam; surface of the wound does not look too bad. There is slight undermining superiorly with gritty material which I think is calcification. There is no evidence of surrounding infection Electronic Signature(s) Signed: 05/14/2021 5:02:53 PM By: Linton Ham MD Entered By: Linton Ham on 05/14/2021 12:51:09 -------------------------------------------------------------------------------- Physician Orders Details Patient Name: Date of Service: [name], [name] 05/14/2021 10:30 A M Medical Record Number: [id number] Patient Account Number: [id number] Date of Birth/Sex: Treating RN: 1931-12-16 (88 y.o. [name] Primary Care Provider: Veleta [name] Other Clinician: Referring Provider: Treating Provider/Extender: [name] in Treatment: [phone] Verbal / Phone Orders: No Diagnosis Coding ICD-10 Coding Code Description L89.620 Pressure ulcer of left heel, unstageable M86.172 Other acute osteomyelitis, left ankle and foot B95.62 Methicillin resistant Staphylococcus aureus infection as the cause of diseases classified elsewhere Follow-up Appointments ppointment in 2 weeks. - Dr. Dellia Nims Return A Bathing/ Shower/ Hygiene May shower with protection but do not get wound dressing(s) wet. Edema Control - Lymphedema / SCD / Other Elevate legs to the level of the heart or  above for 30 minutes daily  and/or when sitting, a frequency of: - throughout the day Exercise regularly - May walk with PT trying to avoid using shoes that apply pressure to heels. Off-Loading Turn and reposition every 2 hours Other: - Float heels off of bed/chair with pillow under calves Wound Treatment Wound #1 - Calcaneus Wound Laterality: Left Cleanser: Wound Cleanser Every Other Day/30 Days Discharge Instructions: Cleanse the wound with wound cleanser prior to applying a clean dressing using gauze sponges, not tissue or cotton balls. Prim Dressing: Hydrofera Blue Ready Foam, 2.5 x2.5 in Every Other Day/30 Days ary Discharge Instructions: Preferred, may use Silver Alginate if cannot get HFB Prim Dressing: Santyl Ointment Every Other Day/30 Days ary Discharge Instructions: Apply nickel thick amount to wound bed, under silver alginate Secondary Dressing: Woven Gauze Sponge, Non-Sterile 4x4 in Every Other Day/30 Days Discharge Instructions: Apply over primary dressing as directed. Secondary Dressing: ALLEVYN Heel 4 1/2in x 5 1/2in / 10.5cm x 13.5cm Every Other Day/30 Days Discharge Instructions: Apply over primary dressing as directed. Secured With: [employer], 4.5x3.1 (in/yd) Every Other Day/30 Days Discharge Instructions: Secure with Kerlix as directed. Secured With: Paper Tape, 2x10 (in/yd) Every Other Day/30 Days Discharge Instructions: Secure dressing with tape as directed. Consults rida - Abnormal arterial study 05/08/21, non healing wound on left heel - (ICD10 L89.620 - Pressure ulcer of left heel, unstageable) Vascular - Dr. Loni Muse Electronic Signature(s) Signed: 05/14/2021 5:02:53 PM By: Linton Ham MD Signed: 05/14/2021 6:15:06 PM By: Dellie Catholic RN Entered By: Dellie Catholic on 05/14/2021 12:13:24 Prescription 05/14/2021 [facility] Affan -------------------------------------------------------------------------------- , Linton Ham MD Patient Name: Provider: 1931-10-12 [id number] Date of  Birth: NPI#: Jerilynn Mages [id number] Sex: DEA #: [phone] [id number] Phone #: License #: [place] Patient Address: [address], [place] [address] [place], [place] [address] [phone] Allergies No Known Drug Allergies Provider's Orders rida - ICD10: L89.620 - Abnormal arterial study 05/08/21, non healing wound on left heel Vascular - Dr. Arlyce Harman Signature: Date(s): Electronic Signature(s) Signed: 05/14/2021 5:02:53 PM By: Linton Ham MD Signed: 05/14/2021 6:15:06 PM By: Dellie Catholic RN Entered By: Dellie Catholic on 05/14/2021 12:13:25 -------------------------------------------------------------------------------- Problem List Details Patient Name: Date of Service: [name], [name] 05/14/2021 10:30 A M Medical Record Number: [id number] Patient Account Number: [id number] Date of Birth/Sex: Treating RN: February 25, 1932 (88 y.o. [name] Primary Care Provider: Other Clinician: Veleta [name] Referring Provider: Treating Provider/Extender: [name] in Treatment: 18 Active Problems ICD-10 Encounter Code Description Active Date MDM Diagnosis L89.620 Pressure ulcer of left heel, unstageable 01/04/2021 No Yes M86.172 Other acute osteomyelitis, left ankle and foot 01/04/2021 No Yes B95.62 Methicillin resistant Staphylococcus aureus infection as the cause of diseases 01/04/2021 No Yes classified elsewhere I70.245 Atherosclerosis of native arteries of left leg with ulceration of other part of 05/14/2021 No Yes foot Inactive Problems Resolved Problems Electronic Signature(s) Signed: 05/14/2021 5:02:53 PM By: Linton Ham MD Entered By: Linton Ham on 05/14/2021 12:44:10 -------------------------------------------------------------------------------- Progress Note Details Patient Name: Date of Service: [name], [name] 05/14/2021 10:30 A M Medical Record Number:  [id number] Patient Account Number: [id number] Date of Birth/Sex: Treating RN: 1931-11-22 (88 y.o. [facility] [name] Primary Care Provider: Veleta [name] Other Clinician: Referring Provider: Treating Provider/Extender: [name] in Treatment: 18 Subjective History of Present Illness (HPI) ADMISSION 01/04/2021 Greg [name] is a pleasant 88 year old woman who lives at [facility]  in the skilled facility. He is accompanied by his wife. I did previously been notified by Dr. Lyndel Safe primary physician about this patient. He apparently fractured his right acetabulum in July. Sometime in the recovery of this developed a pressure ulcer on the right heel. When I was first contacted by Dr. Lyndel Safe this was described as a necrotic wound and indeed this is an accurate description. Unfortunately cultures of this have shown MRSA and after 1 negative x-ray a subsequent x-ray on 01/02/2021 showed changes of osteomyelitis. The patient was started on IV vancomycin at the facility and I believe is also on oral trimethoprim/sulfamethoxazole. They are using silver alginate which was done at my suggestion. Sedimentation rate and the said facility was 55 CRP P was also elevated at 73.7 compatible with his osteomyelitis Past medical history includes a right acetabular fracture, DVT in the right femoral vein also in September on Eliquis, fairly severe Parkinson's disease. He has had [facility] [place] in the skilled facility. ABI in our clinic was noncompressible on the left 10/11; patient presents for follow-up. He has no issues or complaints today. He saw Dr. Tommy Medal on 10/7. He is currently on IV vancomycin. He currently denies systemic signs of infection. 10/18 remains on IV vancomycin. Santyl and silver alginate to the wound bed. He is at the [facility] [place]. His wound has made some minor progress there is not as much necrotic tissue and some viable tissue on the  surface however there is still an odor. The patient had noninvasive arterial tests done at my suggestion. On the left his ABI was noncompressible his TBI was 0.37 with monophasic and biphasic waveforms. Arterial Doppler showed monophasic waveforms at the ATA and PTA. However there was no focally hemodynamic significant stenosis identified. 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 Remains on IV vancomycin difficult to get consistent answers but it seems like they were not interested in the vascular surgery consult at this time. The wound itself somewhat improved in terms of the amount of necrotic material but still a very difficult necrotic wound. There is exposed bone. We are using Santyl and silver alginate 11/15; he has completed her IV antibiotics and now is on trimethoprim sulfamethoxazole although I did not look over infectious disease notes. The wound is not really improved in size although the condition of the wound surface looks somewhat better there is still exposed bone. We have been using Santyl and silver alginate [facility] I think it is quite possible the patient has significant PAD however the patient and his wife were not interested in consideration of a vascular referral therefore I have not made this 12/6; 3-week follow-up at their request. The patient completed IV antibiotics and apparently is on Septra DS. He lives in the skilled area of [facility] [place]. We tried to switch him to [facility] last time apparently that is not on their formulary and he was "unaffordable" per the information we got from the facility. Therefore still on Santyl and silver alginate. They are apparently rigorously offloading the heel when in bed. His wife had some questions about walking with physical therapy. As long as this is not putting undue pressure on the Achilles part of his heel where the wound is this should not be a problem 12/28; Still on Septra  regular strength twice a day. Apparently this has been extended by Dr. Drucilla Schmidt. We are using Santyl under [facility]. We were never able to  get a copy of [facility] formulary. 1/24; 1 month follow-up. Follows with Dr. Drucilla Schmidt in March. Still using Santyl and Hydrofera Blue. Although the wound surface does not look so bad there is extension of this wound superiorly up the back of his Achilles tendon. There is some very firm material which I think is probably calcification rather than bone. 2/7; 2-week follow-up. Reviewed using Santyl and Hydrofera Blue for a prolonged period. 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 Peroneal artery in the mid aspect was not visualized. Both the patient and his wife previously had refused any suggestion of an aggressive approach to the vascular issue however I do not think we are going to heal this wound if the blood flow is obviously inadequate. I explained to him the neck step would be a consultation to a vascular interventional list for consideration of angiography and he wants to proceed with this as does his wife. The wound is stalled. We made considerable improvement with the obvious infection when he first came in he has been treated for osteomyelitis and we have managed to get necrotic tissue off the surface of this however not making any further improvement. Objective Constitutional Sitting or standing Blood Pressure is within target range for patient.. Pulse regular and within target range for patient.Marland Kitchen Respirations regular, non-labored and within target range.. Temperature is normal and within the target range for the patient.Marland Kitchen Appears in no distress. Vitals Time Taken: 11:20 AM, Height: 73 in, Weight: 180 lbs, BMI: 23.7, Temperature: 97.9 F, Pulse: 86 bpm, Respiratory Rate: 18 breaths/min, Blood Pressure: 132/63 mmHg. General  Notes: Wound exam; surface of the wound does not look too bad. There is slight undermining superiorly with gritty material which I think is calcification. There is no evidence of surrounding infection Integumentary (Hair, Skin) Wound #1 status is Open. Original cause of wound was Blister. The date acquired was: 10/24/2020. The wound has been in treatment 18 weeks. The wound is located on the Left Calcaneus. The wound measures 5.5cm length x 5.8cm width x 0.6cm depth; 25.054cm^2 area and 15.033cm^3 volume. There is Fat Layer (Subcutaneous Tissue) exposed. There is no tunneling noted, however, there is undermining starting at 11:00 and ending at 12:00 with a maximum distance of 0.3cm. There is additional undermining and at 4:00 and ending at 6:00 with a maximum distance of 0.3cm. There is a large amount of purulent drainage noted. The wound margin is distinct with the outline attached to the wound base. There is large (67-100%) pink, pale granulation within the wound bed. There is a small (1-33%) amount of necrotic tissue within the wound bed including Eschar and Adherent Slough. Assessment Active Problems ICD-10 Pressure ulcer of left heel, unstageable Other acute osteomyelitis, left ankle and foot Methicillin resistant Staphylococcus aureus infection as the cause of diseases classified elsewhere Atherosclerosis of native arteries of left leg with ulceration of other part of foot Plan Follow-up Appointments: Return Appointment in 2 weeks. - Dr. Dellia Nims Bathing/ Shower/ Hygiene: May shower with protection but do not get wound dressing(s) wet. Edema Control - Lymphedema / SCD / Other: Elevate legs to the level of the heart or above for 30 minutes daily and/or when sitting, a frequency of: - throughout the day Exercise regularly - May walk with PT trying to avoid using shoes that apply pressure to heels. Off-Loading: Turn and reposition every 2 hours Other: - Float heels off of  bed/chair with  pillow under calves Consults ordered were: Vascular - Dr. Fletcher Anon - Abnormal arterial study 05/08/21, non healing wound on left heel WOUND #1: - Calcaneus Wound Laterality: Left Cleanser: Wound Cleanser Every Other Day/30 Days Discharge Instructions: Cleanse the wound with wound cleanser prior to applying a clean dressing using gauze sponges, not tissue or cotton balls. Prim Dressing: Hydrofera Blue Ready Foam, 2.5 x2.5 in Every Other Day/30 Days ary Discharge Instructions: Preferred, may use Silver Alginate if cannot get HFB Prim Dressing: Santyl Ointment Every Other Day/30 Days ary Discharge Instructions: Apply nickel thick amount to wound bed, under silver alginate Secondary Dressing: Woven Gauze Sponge, Non-Sterile 4x4 in Every Other Day/30 Days Discharge Instructions: Apply over primary dressing as directed. Secondary Dressing: ALLEVYN Heel 4 1/2in x 5 1/2in / 10.5cm x 13.5cm Every Other Day/30 Days Discharge Instructions: Apply over primary dressing as directed. Secured With: [employer], 4.5x3.1 (in/yd) Every Other Day/30 Days Discharge Instructions: Secure with Kerlix as directed. Secured With: Paper T ape, 2x10 (in/yd) Every Other Day/30 Days Discharge Instructions: Secure dressing with tape as directed. 1. After reviewing her arterial studies noninvasive they were essentially the same as previous. I am not sure what they did about her bandages. They also note edema making things challenging positioning etc. 2. I am going to send her to Dr. Fletcher Anon for consultation. Question of obviously angiography. I have discussed this is much with the patient who is cognizant and his wife and he agrees to proceed Engineer, maintenance) Signed: 05/14/2021 5:02:53 PM By: Linton Ham MD Entered By: Linton Ham on 05/14/2021 12:53:33 -------------------------------------------------------------------------------- SuperBill Details Patient Name: Date of Service: [name], [name]  05/14/2021 Medical Record Number: [id number] Patient Account Number: [id number] Date of Birth/Sex: Treating RN: [birth year] (88 y.o. [name] Primary Care Provider: Veleta [name] Other Clinician: Referring Provider: Treating Provider/Extender: [name] in Treatment: 18 Diagnosis Coding ICD-10 Codes Code Description [id number] Pressure ulcer of left heel, unstageable M86.172 Other acute osteomyelitis, left ankle and foot B95.62 Methicillin resistant Staphylococcus aureus infection as the cause of diseases classified elsewhere I70.245 Atherosclerosis of native arteries of left leg with ulceration of other part of foot Facility Procedures CPT4 Code: [id number] Description: [place] VISIT-LEV 3 EST PT Modifier: Quantity: 1 Physician Procedures Electronic Signature(s) Signed: 05/14/2021 6:15:06 PM By: Dellie Catholic RN Signed: 05/15/2021 4:37:10 PM By: Linton Ham MD Previous Signature: 05/14/2021 5:02:53 PM Version By: Linton Ham MD Entered By: Dellie Catholic on 05/14/2021 18:11:15

## 2021-05-22 ENCOUNTER — Non-Acute Institutional Stay (SKILLED_NURSING_FACILITY): Payer: Medicare Other | Admitting: Orthopedic Surgery

## 2021-05-22 ENCOUNTER — Encounter: Payer: Self-pay | Admitting: Orthopedic Surgery

## 2021-05-22 DIAGNOSIS — M86172 Other acute osteomyelitis, left ankle and foot: Secondary | ICD-10-CM | POA: Diagnosis not present

## 2021-05-22 DIAGNOSIS — I10 Essential (primary) hypertension: Secondary | ICD-10-CM

## 2021-05-22 DIAGNOSIS — R600 Localized edema: Secondary | ICD-10-CM | POA: Diagnosis not present

## 2021-05-22 DIAGNOSIS — G20 Parkinson's disease: Secondary | ICD-10-CM

## 2021-05-22 DIAGNOSIS — N1832 Chronic kidney disease, stage 3b: Secondary | ICD-10-CM

## 2021-05-22 DIAGNOSIS — D500 Iron deficiency anemia secondary to blood loss (chronic): Secondary | ICD-10-CM

## 2021-05-22 DIAGNOSIS — I82411 Acute embolism and thrombosis of right femoral vein: Secondary | ICD-10-CM

## 2021-05-22 DIAGNOSIS — K5901 Slow transit constipation: Secondary | ICD-10-CM

## 2021-05-22 DIAGNOSIS — M810 Age-related osteoporosis without current pathological fracture: Secondary | ICD-10-CM

## 2021-05-22 DIAGNOSIS — R7303 Prediabetes: Secondary | ICD-10-CM

## 2021-05-22 DIAGNOSIS — E538 Deficiency of other specified B group vitamins: Secondary | ICD-10-CM

## 2021-05-22 NOTE — Progress Notes (Signed)
Location:  [facility] Room Number: 27/A Place of Service:  SNF (31) Provider:  Yvonna Alanis, NP  Patient Care Team: Virgie Dad, MD as PCP - General (Internal Medicine) [name], Eustace Quail, DO as Consulting Physician (Neurology)  Extended Emergency Contact Information Primary Emergency Contact: Vergia Alcon Address: [place].          apt [id number] [place] Phone: [phone] Mobile Phone: [phone] Relation: Spouse Secondary Emergency Contact: rodarius, scannell Mobile Phone: [phone] Relation: Son  Code Status:  Full Code Goals of care: Advanced Directive information Advanced Directives 05/22/2021  Does Patient Have a Medical Advance Directive? Yes  Type of Advance Directive Living will  Does patient want to make changes to medical advance directive? -  Would patient like information on creating a medical advance directive? -     Chief Complaint  Patient presents with   Medical Management of Chronic Issues    Routine visit. Discuss the need for Shingrix vaccine, or post pone if patient refuses.    HPI:  Pt is a 88 y.o. male seen today for medical management of chronic diseases.    He currently resides on the skilled nursing unit at [facility]. [place] includes: CHF, DVT, HTN, constipation, parkinson's, left heel osteomyelitis,  osteoporosis, BPH, CKD stage 3, and prediabetes.   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 Dellia Nims- does not think wound will heal- recommends amputation, he is scheduled to see Dr. April Holding due to abnormal arterial study 02/28, pain to left foot has improved from last month DVT of deep femoral vein- diagnosed 12/2020, remains on Eliquis Parkinson's-  diagnosed 2019, followed by neurology, remains on sinemet, referred to recreational therapy by PT HTN- BUN/creat 37/1.5 04/11/2021, hypotension last month- improved, off carvedilol at this time CKD 3b- see above Prediabetes- - A1c 5.9 10/08/2020 Anemia- hgb 10.1 04/11/2021, remains on ferrous sulfate M/W/F Vitamin B12 deficiency- B12 205 03/04/2021, remains on cyanocobalamin daily Osteoporosis- ORIF right hip 10/10/2020, stopped fosamax 2018 (6 years use), remains on calcium and vitamin D supplements Constipation- LBM 02/14, reports increased loose stools, remains on miralax and senna  He has not seen eye doctor or dermatology since admission to SNF- wife requesting in- house appointments.   Recent blood pressures:  02/14- 131/64, 115/71  02/07- 100/62  02/03- 116/71  Recent weights:  02/15- 184.8 lbs  01/13- 185.4 lbs  12/14- 179.9 lbs  Past Medical History:  Diagnosis Date   Acute bronchiolitis    Acute osteomyelitis of calcaneum, left ([place]) 01/11/2021   Anemia    Bursitis of both hips    Hearing reduced    hearing aid   History of basal cell carcinoma    Hyperlipidemia    Malaise and fatigue    MRSA infection 01/11/2021   Osteoarthritis    hand   Osteoporosis    Palpitation    Vitamin D deficiency    Past Surgical History:  Procedure Laterality Date  CATARACT EXTRACTION  05/31/2015   Eye/right   COLONOSCOPY  02/05/2005, 03/06/2005   normal, 10 years ago   MOHS micrographic surgery     Face surgery   ORIF ACETABULAR FRACTURE Right 10/09/2020   Procedure: OPEN REDUCTION INTERNAL FIXATION (ORIF) ACETABULAR FRACTURE;  Surgeon: Altamese Loomis, MD;  Location: [facility];  Service: Orthopedics;  Laterality: Right;    No Known Allergies  Outpatient Encounter Medications as of 05/22/2021  Medication Sig   Acetaminophen 325 MG CAPS Take 2 capsules by mouth every 6 (six) hours as needed.   albuterol (VENTOLIN HFA) 108 (90 Base) MCG/ACT inhaler Inhale 2 puffs into the lungs every 4 (four) hours as needed for wheezing or shortness of breath.   Amino Acids-Protein Hydrolys (FEEDING SUPPLEMENT, PRO-STAT SUGAR FREE 64,) LIQD Take 30 mLs by mouth in the morning and at bedtime.   apixaban (ELIQUIS) 5 MG TABS tablet Take 1 tablet (5 mg total) by mouth 2 (two) times daily.   Calcium Carbonate-Vitamin D (CALCIUM-VITAMIN D3 PO) Take 600 mg by mouth in the morning.   carbidopa-levodopa (SINEMET CR) 50-200 MG tablet Take 1 tablet by mouth at bedtime.   carbidopa-levodopa (SINEMET IR) 25-100 MG tablet Take 1 tablet by mouth 4 (four) times daily.   collagenase (SANTYL) ointment Apply 1 application topically daily.   ferrous sulfate 325 (65 FE) MG tablet Take 325 mg by mouth. Once A Day on Mon, Wed, Fri   polyethylene glycol (MIRALAX) 17 g packet Take 17 g by mouth daily.   senna (SENOKOT) 8.6 MG TABS tablet Take 1 tablet (8.6 mg total) by mouth daily.   sulfamethoxazole-trimethoprim (BACTRIM) 400-80 MG tablet Take 1 tablet by mouth 2 (two) times daily.   torsemide (DEMADEX) 20 MG tablet Take 1 tablet (20 mg total) by mouth daily.   torsemide (DEMADEX) 20 MG tablet Take 1 tablet (20 mg total) by mouth 2 (two) times a week. Administer on Tuesdays and Thursdays   vitamin B-12 (CYANOCOBALAMIN) 1000 MCG tablet Take 1,000 mcg by mouth daily.   Vitamin D, Cholecalciferol, 25 MCG (1000 UT) TABS Take 25 mcg by mouth daily.   zinc oxide 20 % ointment Apply 1 application topically as needed for irritation.   No facility-administered encounter medications on file as of 05/22/2021.    Review of Systems  Constitutional:  Negative for activity change, appetite change, chills, fatigue  and fever.  HENT:  Positive for hearing loss. Negative for congestion and trouble swallowing.   Eyes:  Negative for visual disturbance.  Respiratory:  Negative for cough, shortness of breath and wheezing.   Cardiovascular:  Positive for leg swelling. Negative for chest pain.  Gastrointestinal:  Positive for constipation and diarrhea. Negative for abdominal distention, abdominal pain, blood in stool, nausea and vomiting.  Genitourinary:  Negative for dysuria, frequency and hematuria.  Musculoskeletal:  Positive for gait problem.  Skin:  Positive for wound.  Neurological:  Positive for tremors and weakness. Negative for dizziness and headaches.  Psychiatric/Behavioral:  Positive for confusion. Negative for dysphoric mood and sleep disturbance. The patient is not nervous/anxious.    Immunization History  Administered Date(s) Administered   H1N1 06/13/2008   Influenza Split 03/05/2006, 02/26/2007, 01/26/2008, 01/18/2009, 12/12/2009, 01/16/2011, 01/29/2012, 02/01/2013, 02/08/2014, 02/13/2015, 01/10/2016, 02/23/2017, 02/24/2018, 01/24/2019, 12/22/2019   Influenza, High Dose Seasonal PF 01/30/2021   PFIZER(Purple Top)SARS-COV-2 Vaccination 05/04/2019, 05/25/2019, 09/01/2020   Pfizer Covid-19 Vaccine Bivalent Booster 62yrs & up 12/27/2019, 12/26/2020   Pneumococcal Conjugate-13 02/10/2014   Pneumococcal Polysaccharide-23 06/10/2011   Zoster,  Live 07/04/2009   Pertinent  Health Maintenance Due  Topic Date Due   INFLUENZA VACCINE  Completed   Fall Risk 10/11/2020 10/12/2020 01/10/2021 03/22/2021 05/03/2021  Falls in the past year? - - 1 0 1  Was there an injury with Fall? - - 1 0 1  Fall Risk Category Calculator - - 2 0 2  Fall Risk Category - - Moderate Low Moderate  Patient Fall Risk Level High fall risk High fall risk High fall risk - High fall risk  Patient at Risk for Falls Due to - - - - History of fall(s);Impaired balance/gait;Impaired mobility  Patient at Risk for Falls Due to - - - -  Parkinson's, past hip fracture  Fall risk Follow up - - - - Falls evaluation completed;Education provided;Falls prevention discussed   Functional Status Survey:    Vitals:   05/22/21 0928  BP: 131/64  Pulse: 80  Resp: 17  Temp: 97.9 F (36.6 C)  SpO2: 96%  Weight: 185 lb 11.2 oz (84.2 kg)  Height: 5\' 9"  (1.753 m)   Body mass index is 27.42 kg/m. Physical Exam Vitals reviewed.  Constitutional:      General: He is not in acute distress. HENT:     Head: Normocephalic.     Right Ear: There is no impacted cerumen.     Left Ear: There is no impacted cerumen.     Ears:     Comments: Hearing aids    Nose: Nose normal.     Mouth/Throat:     Mouth: Mucous membranes are moist.  Eyes:     General:        Right eye: No discharge.        Left eye: No discharge.  Neck:     Vascular: No carotid bruit.  Cardiovascular:     Rate and Rhythm: Normal rate and regular rhythm.     Pulses: Normal pulses.     Heart sounds: Murmur heard.  Pulmonary:     Effort: Pulmonary effort is normal. No respiratory distress.     Breath sounds: Normal breath sounds. No wheezing.  Abdominal:     General: Bowel sounds are normal. There is no distension.     Palpations: Abdomen is soft.     Tenderness: There is no abdominal tenderness.  Musculoskeletal:     Cervical back: Neck supple.     Right lower leg: Edema present.     Left lower leg: Edema present.     Comments: LLE 2+ pitting, RLE non pitting  Lymphadenopathy:     Cervical: No cervical adenopathy.  Skin:    General: Skin is warm and dry.     Capillary Refill: Capillary refill takes less than 2 seconds.     Comments: Unstageable wound to left heel about 5cm x 5cm, CDI, surrounding skin intact  Neurological:     General: No focal deficit present.     Mental Status: He is alert. Mental status is at baseline.     Motor: Weakness present.     Gait: Gait abnormal.     Comments: wheelchair  Psychiatric:        Mood and Affect: Mood normal.         Behavior: Behavior normal.    Labs reviewed: Recent Labs    10/09/20 0546 10/09/20 0937 10/10/20 0607 10/11/20 0037 10/12/20 0146 10/25/20 0000 03/22/21 1141 04/04/21 0000 04/11/21 0000  NA  --    < > 131* 129* 132*   < >  138 139 138  K  --    < > 4.2 4.5 4.1   < > 4.4 4.4 4.3  CL  --    < > 97* 96* 97*   < > 101 103 103  CO2  --    < > 28 29 28    < > 26 29* 29*  GLUCOSE  --    < > 134* 114* 122*  --  127*  --   --   BUN  --    < > 23 26* 24*   < > 48* 39* 37*  CREATININE  --    < > 1.20 1.14 1.14   < > 1.65* 1.6* 1.5*  CALCIUM  --    < > 8.7* 8.6* 8.4*   < > 8.6 8.6* 8.3*  MG  --   --  2.1  --   --   --   --   --   --   PHOS 3.4  --   --   --  3.5  --   --   --   --    < > = values in this interval not displayed.   Recent Labs    10/09/20 0937 10/11/20 0037 10/12/20 0146 12/26/20 0000 12/27/20 0000  AST 22 34  --  19 18  ALT 7 <5  --  9* 6*  ALKPHOS 31* 24*  --  55 46  BILITOT 1.7* 1.1  --   --   --   PROT 5.9* 4.6*  --   --   --   ALBUMIN 3.4* 2.6* 2.6* 3.7 2.9*   Recent Labs    10/11/20 0037 10/12/20 0146 10/25/20 0000 02/21/21 0000 03/22/21 1141 04/18/21 0000  WBC 10.1 8.5   < > 6.5 11.7* 7.1  NEUTROABS  --  6.4   < > 4,310.00 9,992* 4,665.00  HGB 7.4* 7.8*   < > 10.5* 9.9* 10.1*  HCT 21.8* 22.8*   < > 33* 31.2* 32*  MCV 96.9 96.6  --   --  85.7  --   PLT 90* 116*   < > 307 222 291   < > = values in this interval not displayed.   No results found for: TSH Lab Results  Component Value Date   HGBA1C 5.9 (H) 10/08/2020   No results found for: CHOL, HDL, LDLCALC, LDLDIRECT, TRIG, CHOLHDL  Significant Diagnostic Results in last 30 days:  VAS [place] ABI WITH/WO TBI  Result Date: 05/09/2021  LOWER EXTREMITY DOPPLER STUDY Patient Name:  FABRIZIO HOOEY  Date of Exam:   05/08/2021 Medical Rec #: [id number]      Accession #:    [id number] Date of Birth: 20-Feb-1932      Patient Gender: M Patient Age:   12 years Exam Location:  [facility] Procedure:      VAS [place]  ABI WITH/WO TBI Referring Phys: MICHAEL ROBSON --------------------------------------------------------------------------------  Indications: Peripheral artery disease. Patient unable to ambulate far due to              Parkinson's disease and unsteady gait. High Risk Factors: Hypertension, hyperlipidemia. Other Factors: Non healing left heel wound.  Comparison Study: Previous ABI at [facility] on 01/21/2021, right 1.13 and left [place].                   TBI's 0.54 on the right and 0.37 on the left. Performing Technologist: Leavy Cella RDCS Supporting Technologist: Mariane Masters RVT  Examination Guidelines: A complete evaluation  includes at minimum, Doppler waveform signals and systolic blood pressure reading at the level of bilateral brachial, anterior tibial, and posterior tibial arteries, when vessel segments are accessible. Bilateral testing is considered an integral part of a complete examination. Photoelectric Plethysmograph (PPG) waveforms and toe systolic pressure readings are included as required and additional duplex testing as needed. Limited examinations for reoccurring indications may be performed as noted.  ABI Findings: +---------+------------------+-----+-------------------+--------+  Right     Rt Pressure (mmHg) Index Waveform            Comment   +---------+------------------+-----+-------------------+--------+  Brachial  113                                                    +---------+------------------+-----+-------------------+--------+  PTA       142                1.26  monophasic                    +---------+------------------+-----+-------------------+--------+  PERO      138                1.22  biphasic                      +---------+------------------+-----+-------------------+--------+  DP        144                1.27  dampened monophasic           +---------+------------------+-----+-------------------+--------+  Great Toe 67                 0.59  Abnormal                       +---------+------------------+-----+-------------------+--------+ +---------+------------------+-----+----------+-------+  Left      Lt Pressure (mmHg) Index Waveform   Comment  +---------+------------------+-----+----------+-------+  Brachial  113                                          +---------+------------------+-----+----------+-------+  PTA       254                2.25                      +---------+------------------+-----+----------+-------+  PERO      254                2.25  monophasic          +---------+------------------+-----+----------+-------+  DP        251                2.22  monophasic          +---------+------------------+-----+----------+-------+  Great Toe 64                 0.57  Abnormal            +---------+------------------+-----+----------+-------+ +-------+-----------+-----------+------------+------------+  ABI/TBI Today's ABI Today's TBI Previous ABI Previous TBI  +-------+-----------+-----------+------------+------------+  Right   1.27        0.59        1.13         0.54          +-------+-----------+-----------+------------+------------+  Left    [place]          0.57        [place]           0.37          +-------+-----------+-----------+------------+------------+  Bilateral ABIs and TBIs appear essentially unchanged compared to prior study on 01/21/2021.  Summary: Right: Resting right ankle-brachial index is within normal range. No evidence of significant right lower extremity arterial disease. The right toe-brachial index is abnormal. Left: Resting left ankle-brachial index indicates noncompressible left lower extremity arteries. The left toe-brachial index is abnormal.  *See table(s) above for measurements and observations. See arterial duplex report. Patients prefer to defer vascular consult for now.  Electronically signed by [name] MD on 05/09/2021 at 11:13:44 AM.    Final    VAS [place] LOWER EXTREMITY ARTERIAL DUPLEX  Result Date: 05/09/2021 LOWER EXTREMITY ARTERIAL DUPLEX  STUDY Patient Name:  JERONE BOURLIER  Date of Exam:   05/08/2021 Medical Rec #: [id number]      Accession #:    [id number] Date of Birth: 08-22-1931      Patient Gender: M Patient Age:   63 years Exam Location:  [facility] Procedure:      VAS [place] LOWER EXTREMITY ARTERIAL DUPLEX Referring Phys: MICHAEL ROBSON --------------------------------------------------------------------------------  Indications: Peripheral artery disease, and Non-healing left heel wound. Patient              unable to ambulate far due to Parkinson's disease and unsteady              gait. High Risk Factors: Hypertension, hyperlipidemia, no history of smoking.  Current ABI: Today's ABIs are non-compressible on the left and 1.27 on the right Limitations: Technically challenging examination. Limited visualization of left              tibial vessels due to edema/patient positioning/bandages and              calcification. Comparison Study: Previous left arterial duplex at [facility] on 01/21/21 showed no                   focal hemodynamically significant stenosis with waveforms                   consistent with tibial vessel outflow disease. Performing Technologist: Olegario Shearer RVT  Examination Guidelines: A complete evaluation includes B-mode imaging, spectral Doppler, color Doppler, and power Doppler as needed of all accessible portions of each vessel. Bilateral testing is considered an integral part of a complete examination. Limited examinations for reoccurring indications may be performed as noted.  +-----------+--------+-----+--------+---------+--------+  RIGHT       PSV cm/s Ratio Stenosis Waveform  Comments  +-----------+--------+-----+--------+---------+--------+  CFA Prox    100                     triphasic           +-----------+--------+-----+--------+---------+--------+  DFA         84                      triphasic           +-----------+--------+-----+--------+---------+--------+  SFA Prox    63                      triphasic            +-----------+--------+-----+--------+---------+--------+  SFA Mid     78  biphasic            +-----------+--------+-----+--------+---------+--------+  SFA Distal  75                      triphasic           +-----------+--------+-----+--------+---------+--------+  POP Prox    34                      biphasic            +-----------+--------+-----+--------+---------+--------+  POP Distal  40                      biphasic            +-----------+--------+-----+--------+---------+--------+  TP Trunk    46                      biphasic            +-----------+--------+-----+--------+---------+--------+  ATA Prox    52                      biphasic            +-----------+--------+-----+--------+---------+--------+  ATA Mid     28                      biphasic            +-----------+--------+-----+--------+---------+--------+  ATA Distal  65                      biphasic            +-----------+--------+-----+--------+---------+--------+  PTA Prox    38                      biphasic            +-----------+--------+-----+--------+---------+--------+  PTA Mid     31                      biphasic            +-----------+--------+-----+--------+---------+--------+  PTA Distal  21                      biphasic            +-----------+--------+-----+--------+---------+--------+  PERO Prox   41                      biphasic            +-----------+--------+-----+--------+---------+--------+  PERO Mid    55                      biphasic            +-----------+--------+-----+--------+---------+--------+  PERO Distal 57                      biphasic            +-----------+--------+-----+--------+---------+--------+ Highly calcified vessel walls without evidence of stenosis.  +-----------+--------+-----+--------+-----------+--------------+  LEFT        PSV cm/s Ratio Stenosis Waveform    Comments        +-----------+--------+-----+--------+-----------+--------------+  EIA Distal  78                       triphasic                   +-----------+--------+-----+--------+-----------+--------------+  CFA Prox    91                      triphasic                   +-----------+--------+-----+--------+-----------+--------------+  CFA Distal  73                      triphasic                   +-----------+--------+-----+--------+-----------+--------------+  DFA         54                      triphasic                   +-----------+--------+-----+--------+-----------+--------------+  SFA Prox    79                                                  +-----------+--------+-----+--------+-----------+--------------+  SFA Mid     96                      triphasic                   +-----------+--------+-----+--------+-----------+--------------+  SFA Distal  96                      multiphasic                 +-----------+--------+-----+--------+-----------+--------------+  POP Prox    83                      monophasic                  +-----------+--------+-----+--------+-----------+--------------+  POP Distal  86                      monophasic                  +-----------+--------+-----+--------+-----------+--------------+  TP Trunk    144                     monophasic                  +-----------+--------+-----+--------+-----------+--------------+  ATA Prox    95                      monophasic                  +-----------+--------+-----+--------+-----------+--------------+  ATA Mid     101                     monophasic                  +-----------+--------+-----+--------+-----------+--------------+  ATA Distal  75                      monophasic                  +-----------+--------+-----+--------+-----------+--------------+  PTA Prox    71  monophasic                  +-----------+--------+-----+--------+-----------+--------------+  PTA Mid                    occluded                             +-----------+--------+-----+--------+-----------+--------------+  PTA Distal  0               occluded                             +-----------+--------+-----+--------+-----------+--------------+  PERO Prox   75                      monophasic                  +-----------+--------+-----+--------+-----------+--------------+  PERO Mid                                        not visualized  +-----------+--------+-----+--------+-----------+--------------+  PERO Distal 73                      monophasic  dampened        +-----------+--------+-----+--------+-----------+--------------+ No focal stenosis identified. Heavily calcified vessel walls. Occlusion of the posterior tibial artery. Unable to rule out additional tibial vessel stenosis or occlusion due to poor visualization due to edema and calcification.  Summary: Right: Calcified vessel walls without evidence of focal stenosis. Near-normal examination. Left: Calcified vessel walls without evidence of hemodynamically significant stenosis. Suspected occlusion of the posterior tibial artery. Unable to rule out additional tibial vessel stenosis or occlusion due to very poor visualization due to edema and calcification. No significant change from prior exam at [facility] 01/21/21.  See table(s) above for measurements and observations. See ABI report.  Electronically signed by Kathlyn Sacramento MD on 05/09/2021 at 11:15:01 AM.    Final     Assessment/Plan 1. Bilateral leg edema - L>R- LLE 2+ pitting - cont leg elevation in recliner 2-4 hours daily - cannot tolerate aggressive diuresis - cont torsemide daily and additional dose Tue/Thur  2. Acute osteomyelitis of left calcaneus (HCC) - followed by Dr. Dellia Nims and ID- recommend amputation - wound culture MRSA - scheduled to see Dr. April Holding due to abnormal arterial study- scheduled 02/28 - cont Bactrim   3. DVT of deep femoral vein, right (HCC) - Eliquis started 12/2020  4. Parkinson's disease ([place]) - followed by neurology - cont Sinemet  5. Primary hypertension - hypotension last month- improved -  controlled without medication  6. Stage 3b chronic kidney disease (HCC) - creatinine stable - remains on Bactrim and torsemide  7. Prediabetes - A1c stable - A1c- future - schedule yearly eye exam  8. Blood loss anemia - hgb stable - 1 positive hemoccult 03/2021 - cont ferrous sulfate   9. Vitamin B12 deficiency - cont B12  10. Age-related osteoporosis without current pathological fracture - cont calcium and vitamin D - past use Fosamax 6 years  11. Slow transit constipation - increased loose stools - will make miralax QOD - cont senna    Family/ staff Communication: plan discussed with patient, wife and nurse  Labs/tests ordered:  A1c

## 2021-05-23 LAB — HEMOGLOBIN A1C: Hemoglobin A1C: 5.8

## 2021-05-28 ENCOUNTER — Other Ambulatory Visit: Payer: Self-pay

## 2021-05-28 ENCOUNTER — Encounter (HOSPITAL_BASED_OUTPATIENT_CLINIC_OR_DEPARTMENT_OTHER): Payer: Medicare Other | Admitting: Internal Medicine

## 2021-05-28 DIAGNOSIS — L89620 Pressure ulcer of left heel, unstageable: Secondary | ICD-10-CM | POA: Diagnosis not present

## 2021-05-28 NOTE — Progress Notes (Addendum)
Greg [name] ([id number]) , Visit Report for 05/28/2021 Arrival Information Details Patient Name: Date of Service: Greg [name] [facility] 05/28/2021 10:15 A M Medical Record Number: [id number] Patient Account Number: [id number] Date of Birth/Sex: Treating RN: 1931/09/03 (88 y.o. [name] Primary Care Makeda Peeks: Veleta Miners Other Clinician: Donavan Burnet Referring Rosalin Buster: Treating Reis Goga/Extender: Carley Hammed in Treatment: 38 Visit Information History Since Last Visit All ordered tests and consults were completed: Yes Patient Arrived: Wheel Chair Added or deleted any medications: No Arrival Time: 10:13 Any new allergies or adverse reactions: No Accompanied By: self Had a fall or experienced change in No Transfer Assistance: Manual activities of daily living that may affect Patient Identification Verified: Yes risk of falls: Secondary Verification Process Completed: Yes Signs or symptoms of abuse/neglect since last visito No Patient Requires Transmission-Based Precautions: No Hospitalized since last visit: No Patient Has Alerts: Yes Implantable device outside of the clinic excluding No Patient Alerts: Patient on Blood Thinner cellular tissue based products placed in the center L ABI non compressible since last visit: Pain Present Now: No Electronic Signature(s) Signed: 05/28/2021 5:47:05 PM By: Donavan Burnet CHT EMT BS , , Entered By: Donavan Burnet on 05/28/2021 10:26:51 -------------------------------------------------------------------------------- Clinic Level of Care Assessment Details Patient Name: Date of Service: Greg [name] GER 05/28/2021 10:15 A M Medical Record Number: [id number] Patient Account Number: [id number] Date of Birth/Sex: Treating RN: 1932-02-07 (88 y.o. [name] Primary Care Sonora Catlin: Veleta Miners Other Clinician: Referring Sharmeka Palmisano: Treating Marna Weniger/Extender: Carley Hammed in Treatment: 20 Clinic Level of Care Assessment Items TOOL 4 Quantity Score X- 1 0 Use when only an EandM is performed on FOLLOW-UP visit ASSESSMENTS - Nursing Assessment / Reassessment X- 1 10 Reassessment of Co-morbidities (includes updates in patient status) X- 1 5 Reassessment of Adherence to Treatment Plan ASSESSMENTS - Wound and Skin A ssessment / Reassessment X - Simple Wound Assessment / Reassessment - one wound 1 5 '[]'  - 0 Complex Wound Assessment / Reassessment - multiple wounds '[]'  - 0 Dermatologic / Skin Assessment (not related to wound area) ASSESSMENTS - Focused Assessment '[]'  - 0 Circumferential Edema Measurements - multi extremities '[]'  - 0 Nutritional Assessment / Counseling / Intervention '[]'  - 0 Lower Extremity Assessment (monofilament, tuning fork, pulses) '[]'  - 0 Peripheral Arterial Disease Assessment (using hand held doppler) ASSESSMENTS - Ostomy and/or Continence Assessment and Care '[]'  - 0 Incontinence Assessment and Management '[]'  - 0 Ostomy Care Assessment and Management (repouching, etc.) PROCESS - Coordination of Care X - Simple Patient / Family Education for ongoing care 1 15 '[]'  - 0 Complex (extensive) Patient / Family Education for ongoing care '[]'  - 0 Staff obtains Programmer, systems, Records, T Results / Process Orders est X- 1 10 Staff telephones HHA, Nursing Homes / Clarify orders / etc X- 1 10 Routine Transfer to another Facility (non-emergent condition) '[]'  - 0 Routine Hospital Admission (non-emergent condition) '[]'  - 0 New Admissions / Biomedical engineer / Ordering NPWT Apligraf, etc. , '[]'  - 0 Emergency Hospital Admission (emergent condition) X- 1 10 Simple Discharge Coordination '[]'  - 0 Complex (extensive) Discharge Coordination PROCESS - Special Needs '[]'  - 0 Pediatric / Minor Patient Management '[]'  - 0 Isolation Patient Management '[]'  - 0 Hearing / Language / Visual special needs '[]'  - 0 Assessment of Community assistance  (transportation, D/C planning, etc.) '[]'  - 0 Additional assistance / Altered mentation '[]'  - 0 Support Surface(s) Assessment (bed, cushion, seat, etc.) INTERVENTIONS - Wound Cleansing /  Measurement X - Simple Wound Cleansing - one wound 1 5 '[]'  - 0 Complex Wound Cleansing - multiple wounds X- 1 5 Wound Imaging (photographs - any number of wounds) '[]'  - 0 Wound Tracing (instead of photographs) '[]'  - 0 Simple Wound Measurement - one wound '[]'  - 0 Complex Wound Measurement - multiple wounds INTERVENTIONS - Wound Dressings X - Small Wound Dressing one or multiple wounds 1 10 '[]'  - 0 Medium Wound Dressing one or multiple wounds '[]'  - 0 Large Wound Dressing one or multiple wounds '[]'  - 0 Application of Medications - topical '[]'  - 0 Application of Medications - injection INTERVENTIONS - Miscellaneous '[]'  - 0 External ear exam '[]'  - 0 Specimen Collection (cultures, biopsies, blood, body fluids, etc.) '[]'  - 0 Specimen(s) / Culture(s) sent or taken to Lab for analysis X- 1 10 Patient Transfer (multiple staff / Civil Service fast streamer / Similar devices) '[]'  - 0 Simple Staple / Suture removal (25 or less) '[]'  - 0 Complex Staple / Suture removal (26 or more) '[]'  - 0 Hypo / Hyperglycemic Management (close monitor of Blood Glucose) '[]'  - 0 Ankle / Brachial Index (ABI) - do not check if billed separately X- 1 5 Vital Signs Has the patient been seen at the hospital within the last three years: Yes Total Score: 100 Level Of Care: New/Established - Level 3 Electronic Signature(s) Signed: 05/28/2021 12:26:27 PM By: Dellie Catholic RN Entered By: Dellie Catholic on 05/28/2021 12:26:26 -------------------------------------------------------------------------------- Encounter Discharge Information Details Patient Name: Date of Service: Greg Salaam, [name] K. 05/28/2021 10:15 A M Medical Record Number: [id number] Patient Account Number: [id number] Date of Birth/Sex: Treating RN: 1931-11-24 (88 y.o. [name] Primary Care Bethany Hirt: Veleta Miners Other Clinician: Referring Ruweyda Macknight: Treating Calleigh Lafontant/Extender: Carley Hammed in Treatment: 20 Encounter Discharge Information Items Discharge Condition: Stable Ambulatory Status: Wheelchair Discharge Destination: [place] Telephoned: No Orders Sent: Yes Transportation: Other Accompanied By: spouse Schedule Follow-up Appointment: Yes Clinical Summary of Care: Patient Declined Electronic Signature(s) Signed: 06/12/2021 5:14:01 PM By: Maye Hides Previous Signature: 05/28/2021 12:28:29 PM Version By: Dellie Catholic RN Entered By: Maye Hides on 06/12/2021 17:14:01 -------------------------------------------------------------------------------- Lower Extremity Assessment Details Patient Name: Date of Service: Greg Salaam, [name] 05/28/2021 10:15 A M Medical Record Number: [id number] Patient Account Number: [id number] Date of Birth/Sex: Treating RN: 06-Apr-1932 (88 y.o. [name] Primary Care Yurianna Tusing: Veleta Miners Other Clinician: Referring Casimer Russett: Treating Abrar Koone/Extender: Carley Hammed in Treatment: 20 Edema Assessment Assessed: [name]: No] [name]: No] Edema: [Left: Ye] [Right: s] Calf Left: Right: Point of Measurement: 36 cm From Medial Instep 44.2 cm Ankle Left: Right: Point of Measurement: 11 cm From Medial Instep 29 cm Electronic Signature(s) Signed: 05/28/2021 12:28:29 PM By: Dellie Catholic RN Entered By: Dellie Catholic on 05/28/2021 10:39:38 -------------------------------------------------------------------------------- Multi Wound Chart Details Patient Name: Date of Service: Greg Salaam, [name] 05/28/2021 10:15 A M Medical Record Number: [id number] Patient Account Number: [id number] Date of Birth/Sex: Treating RN: 05-12-1931 (88 y.o. [name] Primary Care Carlee Vonderhaar: Veleta Miners Other Clinician: Referring Nashya Garlington: Treating  Qiara Minetti/Extender: Carley Hammed in Treatment: 20 Vital Signs Height(in): 73 Pulse(bpm): 84 Weight(lbs): 180 Blood Pressure(mmHg): 105/52 Body Mass Index(BMI): 23.7 Temperature(F): 98.6 Respiratory Rate(breaths/min): 16 Photos: [N/A:N/A] Left Calcaneus N/A N/A Wound Location: Blister N/A N/A Wounding Event: Pressure Ulcer N/A N/A Primary Etiology: Hypertension, Osteoarthritis N/A N/A Comorbid History: 10/24/2020 N/A N/A Date Acquired: 20 N/A N/A Weeks of Treatment: Open N/A N/A Wound Status: No N/A N/A  Wound Recurrence: 5.8x5x0.3 N/A N/A Measurements L x W x D (cm) 22.777 N/A N/A A (cm) : rea 6.833 N/A N/A Volume (cm) : 51.70% N/A N/A % Reduction in A rea: -45.00% N/A N/A % Reduction in Volume: 11 Starting Position 1 (o'clock): 12 Ending Position 1 (o'clock): 0.4 Maximum Distance 1 (cm): 4 Starting Position 2 (o'clock): 6 Ending Position 2 (o'clock): 0.4 Maximum Distance 2 (cm): Yes N/A N/A Undermining: Unstageable/Unclassified N/A N/A Classification: Large N/A N/A Exudate A mount: Serosanguineous N/A N/A Exudate Type: red, brown N/A N/A Exudate Color: Distinct, outline attached N/A N/A Wound Margin: Large (67-100%) N/A N/A Granulation A mount: Pink, Friable N/A N/A Granulation Quality: Small (1-33%) N/A N/A Necrotic A mount: Fat Layer (Subcutaneous Tissue): Yes N/A N/A Exposed Structures: Fascia: No Tendon: No Muscle: No Joint: No Bone: No Small (1-33%) N/A N/A Epithelialization: Treatment Notes Electronic Signature(s) Signed: 05/28/2021 12:28:29 PM By: Dellie Catholic RN Signed: 05/28/2021 4:44:59 PM By: Linton Ham MD Entered By: Linton Ham on 05/28/2021 12:09:24 -------------------------------------------------------------------------------- Multi-Disciplinary Care Plan Details Patient Name: Date of Service: Greg Salaam, [place] GER 05/28/2021 10:15 A M Medical Record Number: [id number] Patient Account  Number: [id number] Date of Birth/Sex: Treating RN: 09-19-1931 (88 y.o. [name] Primary Care Charizma Gardiner: Veleta Miners Other Clinician: Referring Emillio Ngo: Treating Kennedi Lizardo/Extender: Carley Hammed in Treatment: Charco reviewed with physician Active Inactive Wound/Skin Impairment Nursing Diagnoses: Impaired tissue integrity Knowledge deficit related to ulceration/compromised skin integrity Goals: Patient/caregiver will verbalize understanding of skin care regimen Date Initiated: 01/04/2021 Date Inactivated: 02/05/2021 Target Resolution Date: 02/01/2021 Goal Status: Met Ulcer/skin breakdown will have a volume reduction of 30% by week 4 Date Initiated: 02/05/2021 Date Inactivated: 04/03/2021 Target Resolution Date: 03/12/2021 Goal Status: Met Ulcer/skin breakdown will have a volume reduction of 50% by week 8 Date Initiated: 04/03/2021 Target Resolution Date: 06/08/2021 Goal Status: Active Interventions: Assess patient/caregiver ability to obtain necessary supplies Assess patient/caregiver ability to perform ulcer/skin care regimen upon admission and as needed Assess ulceration(s) every visit Provide education on ulcer and skin care Notes: 02/05/21: Wound not yet at 30% volume reduction, patient has infection and receiving IV antibiotics. Electronic Signature(s) Signed: 05/28/2021 12:28:29 PM By: Dellie Catholic RN Entered By: Dellie Catholic on 05/28/2021 12:27:01 -------------------------------------------------------------------------------- Pain Assessment Details Patient Name: Date of Service: Greg [name] GER 05/28/2021 10:15 A M Medical Record Number: [id number] Patient Account Number: [id number] Date of Birth/Sex: Treating RN: 03/20/1932 (88 y.o. [name] Primary Care Glenola Wheat: Veleta Miners Other Clinician: Referring Joah Patlan: Treating Heather Streeper/Extender: Carley Hammed in Treatment:  20 Active Problems Location of Pain Severity and Description of Pain Patient Has Paino No Site Locations Pain Management and Medication Current Pain Management: Electronic Signature(s) Signed: 05/28/2021 12:28:29 PM By: Dellie Catholic RN Entered By: Dellie Catholic on 05/28/2021 10:41:06 -------------------------------------------------------------------------------- Patient/Caregiver Education Details Patient Name: Date of Service: Greg Salaam, [name] 2/21/2023andnbsp10:15 A M Medical Record Number: [id number] Patient Account Number: [id number] Date of Birth/Gender: Treating RN: 1931/05/18 (88 y.o. [name] Primary Care Physician: Veleta Miners Other Clinician: Referring Physician: Treating Physician/Extender: Carley Hammed in Treatment: 20 Education Assessment Education Provided To: Patient Education Topics Provided Wound/Skin Impairment: Methods: Explain/Verbal Responses: Return demonstration correctly Electronic Signature(s) Signed: 05/28/2021 12:28:29 PM By: Dellie Catholic RN Entered By: Dellie Catholic on 05/28/2021 12:27:20 -------------------------------------------------------------------------------- Wound Assessment Details Patient Name: Date of Service: Greg [name] GER 05/28/2021 10:15 A M Medical Record Number: [id number] Patient Account Number: [id number] Date of Birth/Sex: Treating RN:  Jan 12, 1932 (88 y.o. [name] Primary Care Ambar Raphael: Veleta Miners Other Clinician: Referring Carmine Youngberg: Treating Genine Beckett/Extender: Carley Hammed in Treatment: 20 Wound Status Wound Number: 1 Primary Etiology: Pressure Ulcer Wound Location: Left Calcaneus Wound Status: Open Wounding Event: Blister Comorbid History: Hypertension, Osteoarthritis Date Acquired: 10/24/2020 Weeks Of Treatment: 20 Clustered Wound: No Photos Wound Measurements Length: (cm) 5.8 Width: (cm) 5 Depth: (cm) 0.3 Area: (cm)  22.777 Volume: (cm) 6.833 % Reduction in Area: 51.7% % Reduction in Volume: -45% Epithelialization: Small (1-33%) Tunneling: No Undermining: Yes Location 1 Starting Position (o'clock): 11 Ending Position (o'clock): 12 Maximum Distance: (cm) 0.4 Location 2 Starting Position (o'clock): 4 Ending Position (o'clock): 6 Maximum Distance: (cm) 0.4 Wound Description Classification: Unstageable/Unclassified Wound Margin: Distinct, outline attached Exudate Amount: Large Exudate Type: Serosanguineous Exudate Color: red, brown Foul Odor After Cleansing: No Slough/Fibrino Yes Wound Bed Granulation Amount: Large (67-100%) Exposed Structure Granulation Quality: Pink, Friable Fascia Exposed: No Necrotic Amount: Small (1-33%) Fat Layer (Subcutaneous Tissue) Exposed: Yes Necrotic Quality: Adherent Slough Tendon Exposed: No Muscle Exposed: No Joint Exposed: No Bone Exposed: No Electronic Signature(s) Signed: 05/28/2021 12:28:29 PM By: Dellie Catholic RN Signed: 05/28/2021 5:47:05 PM By: Donavan Burnet CHT EMT BS , , Entered By: Donavan Burnet on 05/28/2021 10:40:58 -------------------------------------------------------------------------------- Vitals Details Patient Name: Date of Service: Greg Salaam, [name] 05/28/2021 10:15 A M Medical Record Number: [id number] Patient Account Number: [id number] Date of Birth/Sex: Treating RN: Jul 24, 1931 (88 y.o. [name] Primary Care Verbon Giangregorio: Veleta Miners Other Clinician: Referring Miguelangel Korn: Treating Elisheva Fallas/Extender: Carley Hammed in Treatment: 20 Vital Signs Time Taken: 10:18 Temperature (F): 98.6 Height (in): 73 Pulse (bpm): 85 Weight (lbs): 180 Respiratory Rate (breaths/min): 16 Body Mass Index (BMI): 23.7 Blood Pressure (mmHg): 105/52 Reference Range: 80 - 120 mg / dl Electronic Signature(s) Signed: 05/28/2021 5:47:05 PM By: Donavan Burnet CHT EMT BS , , Entered By: Donavan Burnet on  05/28/2021 10:27:26

## 2021-05-28 NOTE — Progress Notes (Signed)
NICOLAI CATANO ([id number]) , Visit Report for 05/28/2021 HPI Details Patient Name: Date of Service: 88 y.o. [name] 05/28/2021 10:15 A M Medical Record Number: [id number] Patient Account Number: [id number] Date of Birth/Sex: Treating RN: [birth year] (88 y.o. [name] Primary Care Provider: Veleta Miners Other Clinician: Referring Provider: Treating Provider/Extender: Carley Hammed in Treatment: 20 History of Present Illness HPI Description: ADMISSION 01/04/2021 Mr. Adger is a pleasant 88 year old woman who lives at [place] in the skilled facility. He is accompanied by his wife. I did previously been notified by Dr. Lyndel Safe primary physician about this patient. He apparently fractured his right acetabulum in July. Sometime in the recovery of this developed a pressure ulcer on the right heel. When I was first contacted by Dr. Lyndel Safe this was described as a necrotic wound and indeed this is an accurate description. Unfortunately cultures of this have shown MRSA and after 1 negative x-ray a subsequent x-ray on 01/02/2021 showed changes of osteomyelitis. The patient was started on IV vancomycin at the facility and I believe is also on oral trimethoprim/sulfamethoxazole. They are using silver alginate which was done at my suggestion. Sedimentation rate and the said facility was 55 CRP P was also elevated at 73.7 compatible with his osteomyelitis Past medical history includes a right acetabular fracture, DVT in the right femoral vein also in September on Eliquis, fairly severe Parkinson's disease. He has had [facility] [place] in the skilled facility. ABI in our clinic was noncompressible on the left 10/11; patient presents for follow-up. He has no issues or complaints today. He saw Dr. Tommy Medal on 10/7. He is currently on IV vancomycin. He currently denies systemic signs of infection. 10/18 remains on IV vancomycin. Santyl and silver alginate to the wound bed.  [facility] [place]. His wound has made some minor progress there is not as much necrotic tissue and some viable tissue on the surface however there is still an odor. The patient had noninvasive arterial tests done at my suggestion. On the left his ABI was noncompressible his TBI was 0.37 with monophasic and biphasic waveforms. Arterial Doppler showed monophasic waveforms at the ATA and PTA. However there was no focally hemodynamic significant stenosis identified. 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 Remains on IV vancomycin difficult to get consistent answers but it seems like they were not interested in the vascular surgery consult at this time. The wound itself somewhat improved in terms of the amount of necrotic material but still a very difficult necrotic wound. There is exposed bone. We are using Santyl and silver alginate 11/15; he has completed her IV antibiotics and now is on trimethoprim sulfamethoxazole although I did not look over infectious disease notes. The wound is not really improved in size although the condition of the wound surface looks somewhat better there is still exposed bone. We have been using Santyl and silver alginate [facility] I think it is quite possible the patient has significant PAD however the patient and his wife were not interested in consideration of a vascular referral therefore I have not made this 12/6; 3-week follow-up at their request. The patient completed IV antibiotics and apparently is on Septra DS. He lives in the skilled area of [facility] [place]. We tried to switch him to [facility] last time apparently that is not on their formulary and he was "unaffordable" per the information we got from the facility. Therefore  still on Santyl and silver alginate. They are apparently rigorously offloading the heel when in bed. His wife had some questions about walking with physical  therapy. As long as this is not putting undue pressure on the Achilles part of his heel where the wound is this should not be a problem 12/28; Still on Septra regular strength twice a day. Apparently this has been extended by Dr. Algis Liming. We are using Santyl under [facility]. We were never able to get a copy of [facility] formulary. 1/24; 1 month follow-up. Follows with Dr. Algis Liming in March. Still using Santyl and Hydrofera Blue. Although the wound surface does not look so bad there is extension of this wound superiorly up the back of his Achilles tendon. There is some very firm material which I think is probably calcification rather than bone. 2/7; 2-week follow-up. Reviewed using Santyl and Hydrofera Blue for a prolonged period. 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 Peroneal artery in the mid aspect was not visualized. Both the patient and his wife previously had refused any suggestion of an aggressive approach to the vascular issue however I do not think we are going to heal this wound if the blood flow is obviously inadequate. I explained to him the neck step would be a consultation to a vascular interventional list for consideration of angiography and he wants to proceed with this as does his wife. The wound is stalled. We made considerable improvement with the obvious infection when he first came in he has been treated for osteomyelitis and we have managed to get necrotic tissue off the surface of this however not making any further improvement. 2/21; 2 week follow-up. The patient has follow-ups with Dr. Algis Liming on March 9 and has a consult with Dr. Hilary Hertz that we have arranged on February 28. This is a patient came in with a necrotic wound with underlying osteomyelitis. He had significant arterial disease however initially the patient did not want any consideration of  an invasive workup. Changed about a month ago and that's the reason for the consult with Dr. Hilary Hertz Electronic Signature(s) Signed: 05/28/2021 4:44:59 PM By: Baltazar Najjar MD Entered By: Baltazar Najjar on 05/28/2021 12:17:17 -------------------------------------------------------------------------------- Physical Exam Details Patient Name: Date of Service: 88 y.o. [name], [name] 05/28/2021 10:15 A M Medical Record Number: [id number] Patient Account Number: [id number] Date of Birth/Sex: Treating RN: Oct 28, 1931 (88 y.o. Dianna Limbo Primary Care Provider: [name] Other Clinician: Referring Provider: Treating Provider/Extender: Allen Norris in Treatment: 20 Constitutional Sitting or standing Blood Pressure is within target range for patient.. Pulse regular and within target range for patient.Marland Kitchen Respirations regular, non-labored and within target range.. Temperature is normal and within the target range for the patient.Marland Kitchen Appears in no distress. Notes wound exam; the surface of the wound actually looks quite clean. Superiorly there is a small tunnel however I think what I'm feeling and this is calcification rather than bone. There is no surrounding infection no improvement since last visit however Electronic Signature(s) Signed: 05/28/2021 4:44:59 PM By: Baltazar Najjar MD Entered By: Baltazar Najjar on 05/28/2021 12:21:59 -------------------------------------------------------------------------------- Physician Orders Details Patient Name: Date of Service: 88 y.o. [name], [name] 05/28/2021 10:15 A M Medical Record Number: [id number] Patient Account Number: [id number] Date of Birth/Sex: Treating RN: [birth year] (88 y.o. Dianna Limbo Primary Care Provider: [name] Other Clinician: Referring Provider: Treating Provider/Extender: George Hugh, Nehemiah Settle  in Treatment: 20 Verbal / Phone Orders: No Diagnosis Coding Follow-up Appointments ppointment  in 2 weeks. - Dr. Reyes Ivan Return A Bathing/ Shower/ Hygiene May shower with protection but do not get wound dressing(s) wet. - May lotion leg and foot to help moisturize Edema Control - Lymphedema / SCD / Other Elevate legs to the level of the heart or above for 30 minutes daily and/or when sitting, a frequency of: - throughout the day Exercise regularly - May walk with PT trying to avoid using shoes that apply pressure to heels. Off-Loading Turn and reposition every 2 hours Other: - Float heels off of bed/chair with pillow under calves Wound Treatment Wound #1 - Calcaneus Wound Laterality: Left Cleanser: Wound Cleanser Every Other Day/30 Days Discharge Instructions: Cleanse the wound with wound cleanser prior to applying a clean dressing using gauze sponges, not tissue or cotton balls. Prim Dressing: Hydrofera Blue Ready Foam, 2.5 x2.5 in Every Other Day/30 Days ary Discharge Instructions: Preferred, may use Silver Alginate if cannot get HFB Prim Dressing: Santyl Ointment Every Other Day/30 Days ary Discharge Instructions: Apply nickel thick amount to wound bed, under silver alginate Secondary Dressing: Woven Gauze Sponge, Non-Sterile 4x4 in Every Other Day/30 Days Discharge Instructions: Apply over primary dressing as directed. Secondary Dressing: ALLEVYN Heel 4 1/2in x 5 1/2in / 10.5cm x 13.5cm Every Other Day/30 Days Discharge Instructions: Apply over primary dressing as directed. Secured With: [employer], 4.5x3.1 (in/yd) Every Other Day/30 Days Discharge Instructions: Secure with Kerlix as directed. Secured With: Paper Tape, 2x10 (in/yd) Every Other Day/30 Days Discharge Instructions: Secure dressing with tape as directed. Electronic Signature(s) Signed: 05/28/2021 12:28:29 PM By: Dellie Catholic RN Signed: 05/28/2021 4:44:59 PM By: Linton Ham MD Entered By: Dellie Catholic on 05/28/2021  11:25:31 -------------------------------------------------------------------------------- Problem List Details Patient Name: Date of Service: 88 y.o. [name], [name] 05/28/2021 10:15 A M Medical Record Number: [id number] Patient Account Number: [id number] Date of Birth/Sex: Treating RN: [birth year] (88 y.o. [name] Primary Care Provider: Veleta Miners Other Clinician: Referring Provider: Treating Provider/Extender: Carley Hammed in Treatment: 20 Active Problems ICD-10 Encounter Code Description Active Date MDM Diagnosis L89.620 Pressure ulcer of left heel, unstageable 01/04/2021 No Yes M86.172 Other acute osteomyelitis, left ankle and foot 01/04/2021 No Yes B95.62 Methicillin resistant Staphylococcus aureus infection as the cause of diseases 01/04/2021 No Yes classified elsewhere I70.245 Atherosclerosis of native arteries of left leg with ulceration of other part of 05/14/2021 No Yes foot Inactive Problems Resolved Problems Electronic Signature(s) Signed: 05/28/2021 4:44:59 PM By: Linton Ham MD Entered By: Linton Ham on 05/28/2021 12:09:13 -------------------------------------------------------------------------------- Progress Note Details Patient Name: Date of Service: 88 y.o. [name], [name] 05/28/2021 10:15 A M Medical Record Number: [id number] Patient Account Number: [id number] Date of Birth/Sex: Treating RN: [birth year] (88 y.o. [name] Primary Care Provider: Veleta Miners Other Clinician: Referring Provider: Treating Provider/Extender: Carley Hammed in Treatment: 20 Subjective History of Present Illness (HPI) ADMISSION 01/04/2021 Mr. Auth is a pleasant 88 year old woman who lives at [place] in the skilled facility. He is accompanied by his wife. I did previously been notified by Dr. Lyndel Safe primary physician about this patient. He apparently fractured his right acetabulum in July. Sometime in the  recovery of this developed a pressure ulcer on the right heel. When I was first contacted by Dr. Lyndel Safe this was described as a necrotic wound and indeed this is an accurate description. Unfortunately cultures of this have shown MRSA and after 1 negative  x-ray a subsequent x-ray on 01/02/2021 showed changes of osteomyelitis. The patient was started on IV vancomycin at the facility and I believe is also on oral trimethoprim/sulfamethoxazole. They are using silver alginate which was done at my suggestion. Sedimentation rate and the said facility was 55 CRP P was also elevated at 73.7 compatible with his osteomyelitis Past medical history includes a right acetabular fracture, DVT in the right femoral vein also in September on Eliquis, fairly severe Parkinson's disease. He has had [facility] [place] in the skilled facility. ABI in our clinic was noncompressible on the left 10/11; patient presents for follow-up. He has no issues or complaints today. He saw Dr. Tommy Medal on 10/7. He is currently on IV vancomycin. He currently denies systemic signs of infection. 10/18 remains on IV vancomycin. Santyl and silver alginate to the wound bed. He is at the [facility] [place]. His wound has made some minor progress there is not as much necrotic tissue and some viable tissue on the surface however there is still an odor. The patient had noninvasive arterial tests done at my suggestion. On the left his ABI was noncompressible his TBI was 0.37 with monophasic and biphasic waveforms. Arterial Doppler showed monophasic waveforms at the ATA and PTA. However there was no focally hemodynamic significant stenosis identified. 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 Remains on IV vancomycin difficult to get consistent answers but it seems like they were not interested in the vascular surgery consult at this time. The wound itself somewhat improved in terms of the amount of necrotic  material but still a very difficult necrotic wound. There is exposed bone. We are using Santyl and silver alginate 11/15; he has completed her IV antibiotics and now is on trimethoprim sulfamethoxazole although I did not look over infectious disease notes. The wound is not really improved in size although the condition of the wound surface looks somewhat better there is still exposed bone. We have been using Santyl and silver alginate [facility] at [facility] I think it is quite possible the patient has significant PAD however the patient and his wife were not interested in consideration of a vascular referral therefore I have not made this 12/6; 3-week follow-up at their request. The patient completed IV antibiotics and apparently is on Septra DS. He lives in the skilled area of [facility] [place]. We tried to switch him to [facility] last time apparently that is not on their formulary and he was "unaffordable" per the information we got from the facility. Therefore still on Santyl and silver alginate. They are apparently rigorously offloading the heel when in bed. His wife had some questions about walking with physical therapy. As long as this is not putting undue pressure on the Achilles part of his heel where the wound is this should not be a problem 12/28; Still on Septra regular strength twice a day. Apparently this has been extended by Dr. Drucilla Schmidt. We are using Santyl under [facility]. We were never able to get a copy of [facility] formulary. 1/24; 1 month follow-up. Follows with Dr. Drucilla Schmidt in March. Still using Santyl and Hydrofera Blue. Although the wound surface does not look so bad there is extension of this wound superiorly up the back of his Achilles tendon. There is some very firm material which I think is probably calcification rather than bone. 2/7; 2-week follow-up. Reviewed using Santyl and Hydrofera Blue for a prolonged period. His arterial studies show  noncompressible ABIs however monophasic waveforms below the SFA and occluded posterior tibial arterie in the mid and distal aspect.. Peroneal artery in the mid aspect was not visualized. Both the patient and his wife previously had refused any suggestion of an aggressive approach to the vascular issue however I do not think we are going to heal this wound if the blood flow is obviously inadequate. I explained to him the neck step would be a consultation to a vascular interventional list for consideration of angiography and he wants to proceed with this as does his wife. The wound is stalled. We made considerable improvement with the obvious infection when he first came in he has been treated for osteomyelitis and we have managed to get necrotic tissue off the surface of this however not making any further improvement. 2/21; 2 week follow-up. The patient has follow-ups with Dr. Drucilla Schmidt on March 9 and has a consult with Dr. Cicero Duck that we have arranged on February 28. This is a patient came in with a necrotic wound with underlying osteomyelitis. He had significant arterial disease however initially the patient did not want any consideration of an invasive workup. Changed about a month ago and that's the reason for the consult with Dr. Cicero Duck Objective Constitutional Sitting or standing Blood Pressure is within target range for patient.. Pulse regular and within target range for patient.Marland Kitchen Respirations regular, non-labored and within target range.. Temperature is normal and within the target range for the patient.Marland Kitchen Appears in no distress. Vitals Time Taken: 10:18 AM, Height: 73 in, Weight: 180 lbs, BMI: 23.7, Temperature: 98.6 F, Pulse: 85 bpm, Respiratory Rate: 16 breaths/min, Blood Pressure: 105/52 mmHg. General Notes: wound exam; the surface of the wound actually looks quite clean. Superiorly there is a small tunnel however I think what I'm feeling and this is calcification rather than bone. There is no  surrounding infection no improvement since last visit however Integumentary (Hair, Skin) Wound #1 status is Open. Original cause of wound was Blister. The date acquired was: 10/24/2020. The wound has been in treatment 20 weeks. The wound is located on the Left Calcaneus. The wound measures 5.8cm length x 5cm width x 0.3cm depth; 22.777cm^2 area and 6.833cm^3 volume. There is Fat Layer (Subcutaneous Tissue) exposed. There is no tunneling noted, however, there is undermining starting at 11:00 and ending at 12:00 with a maximum distance of 0.4cm. There is additional undermining and at 4:00 and ending at 6:00 with a maximum distance of 0.4cm. There is a large amount of serosanguineous drainage noted. The wound margin is distinct with the outline attached to the wound base. There is large (67-100%) pink, friable granulation within the wound bed. There is a small (1-33%) amount of necrotic tissue within the wound bed including Adherent Slough. Assessment Active Problems ICD-10 Pressure ulcer of left heel, unstageable Other acute osteomyelitis, left ankle and foot Methicillin resistant Staphylococcus aureus infection as the cause of diseases classified elsewhere Atherosclerosis of native arteries of left leg with ulceration of other part of foot Plan Follow-up Appointments: Return Appointment in 2 weeks. - Dr. Reyes Ivan Bathing/ Shower/ Hygiene: May shower with protection but do not get wound dressing(s) wet. - May lotion leg and foot to help moisturize Edema Control - Lymphedema / SCD / Other: Elevate legs to the level of the heart or above for 30 minutes daily and/or when sitting, a frequency of: - throughout the day Exercise regularly - May walk with PT trying to avoid using shoes that apply pressure to heels.  Off-Loading: Turn and reposition every 2 hours Other: - Float heels off of bed/chair with pillow under calves WOUND #1: - Calcaneus Wound Laterality: Left Cleanser: Wound Cleanser  Every Other Day/30 Days Discharge Instructions: Cleanse the wound with wound cleanser prior to applying a clean dressing using gauze sponges, not tissue or cotton balls. Prim Dressing: Hydrofera Blue Ready Foam, 2.5 x2.5 in Every Other Day/30 Days ary Discharge Instructions: Preferred, may use Silver Alginate if cannot get HFB Prim Dressing: Santyl Ointment Every Other Day/30 Days ary Discharge Instructions: Apply nickel thick amount to wound bed, under silver alginate Secondary Dressing: Woven Gauze Sponge, Non-Sterile 4x4 in Every Other Day/30 Days Discharge Instructions: Apply over primary dressing as directed. Secondary Dressing: ALLEVYN Heel 4 1/2in x 5 1/2in / 10.5cm x 13.5cm Every Other Day/30 Days Discharge Instructions: Apply over primary dressing as directed. Secured With: [employer], 4.5x3.1 (in/yd) Every Other Day/30 Days Discharge Instructions: Secure with Kerlix as directed. Secured With: Paper T ape, 2x10 (in/yd) Every Other Day/30 Days Discharge Instructions: Secure dressing with tape as directed. #1I'm still continuing with Santyl and silver alginate #2 looking at Dr. Fletcher Anon to see if anything can be done about macro vascular disease. The real issue is whether he is a candidate for an angiogram and from my point of view. he is minimally ambulatory with Parkinson's disease. Electronic Signature(s) Signed: 05/28/2021 4:44:59 PM By: Linton Ham MD Entered By: Linton Ham on 05/28/2021 12:24:01 -------------------------------------------------------------------------------- SuperBill Details Patient Name: Date of Service: 88 y.o. [name], [name] 05/28/2021 Medical Record Number: [id number] Patient Account Number: [id number] Date of Birth/Sex: Treating RN: Jul 21, 1931 (87 y.o. [name] Primary Care Provider: Veleta Miners Other Clinician: Referring Provider: Treating Provider/Extender: Carley Hammed in Treatment: 20 Diagnosis  Coding ICD-10 Codes Code Description [phone] Pressure ulcer of left heel, unstageable M86.172 Other acute osteomyelitis, left ankle and foot B95.62 Methicillin resistant Staphylococcus aureus infection as the cause of diseases classified elsewhere I70.245 Atherosclerosis of native arteries of left leg with ulceration of other part of foot Facility Procedures CPT4 Code: [id number] Description: 99213 - WOUND CARE VISIT-LEV 3 EST PT Modifier: Quantity: 1 Physician Procedures : CPT4 Code Description Modifier [id number] - WC PHYS LEVEL 3 - EST PT ICD-10 Diagnosis Description L89.620 Pressure ulcer of left heel, unstageable M86.172 Other acute osteomyelitis, left ankle and foot Quantity: 1 Electronic Signature(s) Signed: 05/28/2021 12:28:29 PM By: Dellie Catholic RN Signed: 05/28/2021 4:44:59 PM By: Linton Ham MD Entered By: Dellie Catholic on 05/28/2021 12:26:38

## 2021-06-04 ENCOUNTER — Ambulatory Visit (INDEPENDENT_AMBULATORY_CARE_PROVIDER_SITE_OTHER): Payer: Medicare Other | Admitting: Cardiovascular Disease

## 2021-06-04 ENCOUNTER — Other Ambulatory Visit: Payer: Self-pay

## 2021-06-04 ENCOUNTER — Encounter: Payer: Self-pay | Admitting: Cardiovascular Disease

## 2021-06-04 VITALS — BP 108/66 | HR 79 | Resp 20 | Ht 70.0 in | Wt 183.6 lb

## 2021-06-04 DIAGNOSIS — I70222 Atherosclerosis of native arteries of extremities with rest pain, left leg: Secondary | ICD-10-CM | POA: Diagnosis not present

## 2021-06-04 DIAGNOSIS — I739 Peripheral vascular disease, unspecified: Secondary | ICD-10-CM

## 2021-06-04 DIAGNOSIS — Z01818 Encounter for other preprocedural examination: Secondary | ICD-10-CM | POA: Diagnosis not present

## 2021-06-04 DIAGNOSIS — Z01812 Encounter for preprocedural laboratory examination: Secondary | ICD-10-CM

## 2021-06-04 LAB — BASIC METABOLIC PANEL
BUN/Creatinine Ratio: 23 (ref 10–24)
BUN: 35 mg/dL — ABNORMAL HIGH (ref 8–27)
CO2: 29 mmol/L (ref 20–29)
Calcium: 8.9 mg/dL (ref 8.6–10.2)
Chloride: 97 mmol/L (ref 96–106)
Creatinine, Ser: 1.49 mg/dL — ABNORMAL HIGH (ref 0.76–1.27)
Glucose: 83 mg/dL (ref 70–99)
Potassium: 4.4 mmol/L (ref 3.5–5.2)
Sodium: 138 mmol/L (ref 134–144)
eGFR: 45 mL/min/{1.73_m2} — ABNORMAL LOW (ref >59)

## 2021-06-04 LAB — CBC
Hematocrit: 32.8 % — ABNORMAL LOW (ref 37.5–51.0)
Hemoglobin: 11.1 g/dL — ABNORMAL LOW (ref 13.0–17.7)
MCH: 29.4 pg (ref 26.6–33.0)
MCHC: 33.8 g/dL (ref 31.5–35.7)
MCV: 87 fL (ref 79–97)
Platelets: 236 10*3/uL (ref 150–450)
RBC: 3.78 x10E6/uL — ABNORMAL LOW (ref 4.14–5.80)
RDW: 15.1 % (ref 11.6–15.4)
WBC: 5.7 10*3/uL (ref 3.4–10.8)

## 2021-06-04 NOTE — H&P (View-Only) (Signed)
Cardiology Office Note   Date:  06/06/2021   ID:  Greg [name], DOB 1932/01/28, MRN [id number]  PCP:  [name], MD  Cardiologist:   [name], MD   No chief complaint on file.     History of Present Illness: Greg [name] is a 88 y.o. male who was referred by Dr. [name] for evaluation management of peripheral arterial disease. He has no history of diabetes or tobacco use.  He does have Parkinson's and chronic kidney disease.  No previous cardiac history.    He fractured his right acetabulum in July.  He developed a pressure ulcer on the left heel after that.  He had MRSA infection and changes suggestive of osteomyelitis.  He was treated with IV vancomycin.  He also developed acute DVT after hip surgery and has been on Eliquis since then.  He has been on anticoagulation for 5 months with plans to stop anticoagulation in 1 month although he remains high risk for DVT in the future.  He underwent noninvasive vascular studies earlier this month which showed an ABI of 1.27 on the right and noncompressible on the left.  mostly monophasic waveforms in the tibial vessels.  Duplex showed no significant SFA or popliteal disease.  His vessels were heavily calcified with occluded left mid PTA.  He has no significant claudication.  He denies chest pain or shortness of breath.  Past Medical History:  Diagnosis Date   Acute bronchiolitis    Acute osteomyelitis of calcaneum, left (HCC) 01/11/2021   Anemia    Bursitis of both hips    Hearing reduced    hearing aid   History of basal cell carcinoma    Hyperlipidemia    Malaise and fatigue    MRSA infection 01/11/2021   Osteoarthritis    hand   Osteoporosis    Palpitation    Vitamin D deficiency     Past Surgical History:  Procedure Laterality Date   CATARACT EXTRACTION  05/31/2015   Eye/right   COLONOSCOPY  02/05/2005, 03/06/2005   normal, 10 years ago   MOHS micrographic surgery     Face surgery   ORIF ACETABULAR  FRACTURE Right 10/09/2020   Procedure: OPEN REDUCTION INTERNAL FIXATION (ORIF) ACETABULAR FRACTURE;  Surgeon: [name], MD;  Location: [facility] OR;  Service: Orthopedics;  Laterality: Right;     Current Outpatient Medications  Medication Sig Dispense Refill   Acetaminophen 325 MG CAPS Take 2 capsules by mouth every 6 (six) hours as needed.     albuterol (VENTOLIN HFA) 108 (90 Base) MCG/ACT inhaler Inhale 2 puffs into the lungs every 4 (four) hours as needed for wheezing or shortness of breath.     Amino Acids-Protein Hydrolys (FEEDING SUPPLEMENT, PRO-STAT SUGAR FREE 64,) LIQD Take 30 mLs by mouth in the morning and at bedtime.     apixaban (ELIQUIS) 5 MG TABS tablet Take 1 tablet (5 mg total) by mouth 2 (two) times daily. 60 tablet 3   Calcium Carbonate-Vitamin D (CALCIUM-VITAMIN D3 PO) Take 600 mg by mouth in the morning.     carbidopa-levodopa (SINEMET CR) 50-200 MG tablet Take 1 tablet by mouth at bedtime. 90 tablet 1   carbidopa-levodopa (SINEMET IR) 25-100 MG tablet Take 1 tablet by mouth 4 (four) times daily. 360 tablet 1   collagenase (SANTYL) ointment Apply 1 application topically daily.     ferrous sulfate 325 (65 FE) MG tablet Take 325 mg by mouth. Once A Day on Mon, Wed, Fri  polyethylene glycol (MIRALAX) 17 g packet Take 17 g by mouth daily. 14 each 0   senna (SENOKOT) 8.6 MG TABS tablet Take 1 tablet (8.6 mg total) by mouth daily. 120 tablet 0   sulfamethoxazole-trimethoprim (BACTRIM) 400-80 MG tablet Take 1 tablet by mouth 2 (two) times daily. 60 tablet 11   torsemide (DEMADEX) 20 MG tablet Take 1 tablet (20 mg total) by mouth daily. 30 tablet 0   vitamin B-12 (CYANOCOBALAMIN) 1000 MCG tablet Take 1,000 mcg by mouth daily.     Vitamin D, Cholecalciferol, 25 MCG (1000 UT) TABS Take 25 mcg by mouth daily.     zinc oxide 20 % ointment Apply 1 application topically as needed for irritation.     No current facility-administered medications for this visit.    Allergies:   Patient  has no known allergies.    Social History:  The patient  reports that he has never smoked. He has never used smokeless tobacco. He reports that he does not drink alcohol and does not use drugs.   Family History:  The patient's family history includes Prostate cancer in his father; Skin cancer in his brother.    ROS:  Please see the history of present illness.   Otherwise, review of systems are positive for none.   All other systems are reviewed and negative.    PHYSICAL EXAM: VS:  BP 108/66 (BP Location: Left Arm, Patient Position: Sitting, Cuff Size: Normal)    Pulse 79    Resp 20    Ht 5\' 10"  (1.778 m)    Wt 183 lb 9.6 oz (83.3 kg)    SpO2 97%    BMI 26.34 kg/m  , BMI Body mass index is 26.34 kg/m. GEN: Well nourished, well developed, in no acute distress  HEENT: normal  Neck: no JVD, carotid bruits, or masses Cardiac: RRR; no rubs, or gallops,no edema .  2 out of 6 systolic murmur in the aortic area. Respiratory:  clear to auscultation bilaterally, normal work of breathing GI: soft, nontender, nondistended, + BS MS: no deformity or atrophy  Skin: warm and dry, no rash Neuro:  Strength and sensation are intact Psych: euthymic mood, full affect Large wound on the left heel   EKG:  EKG is ordered today. The ekg ordered today demonstrates sinus rhythm with left atrial enlargement.   Recent Labs: 10/10/2020: Magnesium 2.1 12/27/2020: ALT 6 06/04/2021: BUN 35; Creatinine, Ser 1.49; Hemoglobin 11.1; Platelets 236; Potassium 4.4; Sodium 138    Lipid Panel No results found for: CHOL, TRIG, HDL, CHOLHDL, VLDL, LDLCALC, LDLDIRECT    Wt Readings from Last 3 Encounters:  06/04/21 183 lb 9.6 oz (83.3 kg)  05/22/21 185 lb 11.2 oz (84.2 kg)  05/03/21 184 lb 6.4 oz (83.6 kg)      No flowsheet data found.    ASSESSMENT AND PLAN:  1.  Peripheral arterial disease with critical limb ischemia: The patient has nonhealing ulceration on the left heel with osteomyelitis.  Vascular studies  are suggestive of tibial disease.  His left posterior tibial artery appears to be occluded .  I discussed different management options with him and I do think it is worth proceeding with angiography and possible endovascular intervention to improve blood flow and the possibility of healing especially that the patient is trying to avoid amputation at all cost.  However, I did explain to the patient and his wife that even with successful revascularization, amputation might still be needed considering the presence of osteomyelitis.  I discussed the procedure in details as well as risk and benefits.  He does have underlying chronic kidney disease and we will hydrate him for 4 hours before the procedure.  Hold Eliquis 2 days before.  Planned access is via the right common femoral artery.  2.  Parkinson's: Seems to be stable on medications.  3.  Acute DVT post hip surgery: He is currently on anticoagulation with Eliquis.  If endovascular intervention is performed and there is a need for dual antiplatelet therapy, we can likely stop Eliquis or use a reduced dose.    Disposition: Proceed with an angiogram and follow-up after.  Signed,  [name], MD  06/06/2021 9:56 AM    [facility]

## 2021-06-04 NOTE — Patient Instructions (Signed)
Medication Instructions:  No changes *If you need a refill on your cardiac medications before your next appointment, please call your pharmacy*  Testing/Procedures: Your physician has requested that you have a peripheral vascular angiogram. This exam is performed at the hospital. During this exam IV contrast is used to look at arterial blood flow. Please review the information sheet given for details.   Follow-Up: At [facility], you and your health needs are our priority.  As part of our continuing mission to provide you with exceptional heart care, we have created designated Provider Care Teams.  These Care Teams include your primary Cardiologist (physician) and Advanced Practice Providers (APPs -  Physician Assistants and Nurse Practitioners) who all work together to provide you with the care you need, when you need it.  We recommend signing up for the patient portal called "MyChart".  Sign up information is provided on this After Visit Summary.  MyChart is used to connect with patients for Virtual Visits (Telemedicine).  Patients are able to view lab/test results, encounter notes, upcoming appointments, etc.  Non-urgent messages can be sent to your provider as well.   To learn more about what you can do with MyChart, go to [email or web address].    Your next appointment:   Keep your post procedure follow up with Dr. Kirke Corin on 4/4 at 11:20 am   Other Instructions  [facility] CARDIOVASCULAR DIVISION [facility] [address] [place] [address] Dept: [phone] Loc: [phone]  Greg [name]  06/04/2021  You are scheduled for a Peripheral Angiogram on Wednesday, March 8 with Dr. [name].  1. Please arrive at the [facility] (Main Entrance A) at [facility]: [address] [place], [place] [address] at 6:30 AM (This time is 4 hours before your procedure to ensure your preparation). Free valet parking service is  available.   Special note: Every effort is made to have your procedure done on time. Please understand that emergencies sometimes delay scheduled procedures.  2. Diet: Do not eat solid foods after midnight.  The patient may have clear liquids until 5am upon the day of the procedure.  3. Labs: You will need to have blood drawn: completed 2/28  4. Medication instructions in preparation for your procedure: Hold the Eliquis two days prior and the day of the procedure (Hold 3/6 and 3/7) Hold the Torsemide the morning of the procedure  On the morning of your procedure, take your Aspirin and any morning medicines NOT listed above.  You may use sips of water.  5. Plan for one night stay--bring personal belongings. 6. Bring a current list of your medications and current insurance cards. 7. You MUST have a responsible person to drive you home. 8. Someone MUST be with you the first 24 hours after you arrive home or your discharge will be delayed. 9. Please wear clothes that are easy to get on and off and wear slip-on shoes.  Thank you for allowing [place] to care for you!   -- [facility]

## 2021-06-04 NOTE — Progress Notes (Addendum)
Cardiology Office Note   Date:  06/06/2021   ID:  Greg [name], DOB 1932/01/28, MRN [id number]  PCP:  [name], MD  Cardiologist:   [name], MD   No chief complaint on file.     History of Present Illness: Greg [name] is a 88 y.o. male who was referred by Dr. [name] for evaluation management of peripheral arterial disease. He has no history of diabetes or tobacco use.  He does have Parkinson's and chronic kidney disease.  No previous cardiac history.    He fractured his right acetabulum in July.  He developed a pressure ulcer on the left heel after that.  He had MRSA infection and changes suggestive of osteomyelitis.  He was treated with IV vancomycin.  He also developed acute DVT after hip surgery and has been on Eliquis since then.  He has been on anticoagulation for 5 months with plans to stop anticoagulation in 1 month although he remains high risk for DVT in the future.  He underwent noninvasive vascular studies earlier this month which showed an ABI of 1.27 on the right and noncompressible on the left.  mostly monophasic waveforms in the tibial vessels.  Duplex showed no significant SFA or popliteal disease.  His vessels were heavily calcified with occluded left mid PTA.  He has no significant claudication.  He denies chest pain or shortness of breath.  Past Medical History:  Diagnosis Date   Acute bronchiolitis    Acute osteomyelitis of calcaneum, left (HCC) 01/11/2021   Anemia    Bursitis of both hips    Hearing reduced    hearing aid   History of basal cell carcinoma    Hyperlipidemia    Malaise and fatigue    MRSA infection 01/11/2021   Osteoarthritis    hand   Osteoporosis    Palpitation    Vitamin D deficiency     Past Surgical History:  Procedure Laterality Date   CATARACT EXTRACTION  05/31/2015   Eye/right   COLONOSCOPY  02/05/2005, 03/06/2005   normal, 10 years ago   MOHS micrographic surgery     Face surgery   ORIF ACETABULAR  FRACTURE Right 10/09/2020   Procedure: OPEN REDUCTION INTERNAL FIXATION (ORIF) ACETABULAR FRACTURE;  Surgeon: [name], MD;  Location: [facility] OR;  Service: Orthopedics;  Laterality: Right;     Current Outpatient Medications  Medication Sig Dispense Refill   Acetaminophen 325 MG CAPS Take 2 capsules by mouth every 6 (six) hours as needed.     albuterol (VENTOLIN HFA) 108 (90 Base) MCG/ACT inhaler Inhale 2 puffs into the lungs every 4 (four) hours as needed for wheezing or shortness of breath.     Amino Acids-Protein Hydrolys (FEEDING SUPPLEMENT, PRO-STAT SUGAR FREE 64,) LIQD Take 30 mLs by mouth in the morning and at bedtime.     apixaban (ELIQUIS) 5 MG TABS tablet Take 1 tablet (5 mg total) by mouth 2 (two) times daily. 60 tablet 3   Calcium Carbonate-Vitamin D (CALCIUM-VITAMIN D3 PO) Take 600 mg by mouth in the morning.     carbidopa-levodopa (SINEMET CR) 50-200 MG tablet Take 1 tablet by mouth at bedtime. 90 tablet 1   carbidopa-levodopa (SINEMET IR) 25-100 MG tablet Take 1 tablet by mouth 4 (four) times daily. 360 tablet 1   collagenase (SANTYL) ointment Apply 1 application topically daily.     ferrous sulfate 325 (65 FE) MG tablet Take 325 mg by mouth. Once A Day on Mon, Wed, Fri  polyethylene glycol (MIRALAX) 17 g packet Take 17 g by mouth daily. 14 each 0   senna (SENOKOT) 8.6 MG TABS tablet Take 1 tablet (8.6 mg total) by mouth daily. 120 tablet 0   sulfamethoxazole-trimethoprim (BACTRIM) 400-80 MG tablet Take 1 tablet by mouth 2 (two) times daily. 60 tablet 11   torsemide (DEMADEX) 20 MG tablet Take 1 tablet (20 mg total) by mouth daily. 30 tablet 0   vitamin B-12 (CYANOCOBALAMIN) 1000 MCG tablet Take 1,000 mcg by mouth daily.     Vitamin D, Cholecalciferol, 25 MCG (1000 UT) TABS Take 25 mcg by mouth daily.     zinc oxide 20 % ointment Apply 1 application topically as needed for irritation.     No current facility-administered medications for this visit.    Allergies:   Patient  has no known allergies.    Social History:  The patient  reports that he has never smoked. He has never used smokeless tobacco. He reports that he does not drink alcohol and does not use drugs.   Family History:  The patient's family history includes Prostate cancer in his father; Skin cancer in his brother.    ROS:  Please see the history of present illness.   Otherwise, review of systems are positive for none.   All other systems are reviewed and negative.    PHYSICAL EXAM: VS:  BP 108/66 (BP Location: Left Arm, Patient Position: Sitting, Cuff Size: Normal)    Pulse 79    Resp 20    Ht 5\' 10"  (1.778 m)    Wt 183 lb 9.6 oz (83.3 kg)    SpO2 97%    BMI 26.34 kg/m  , BMI Body mass index is 26.34 kg/m. GEN: Well nourished, well developed, in no acute distress  HEENT: normal  Neck: no JVD, carotid bruits, or masses Cardiac: RRR; no rubs, or gallops,no edema .  2 out of 6 systolic murmur in the aortic area. Respiratory:  clear to auscultation bilaterally, normal work of breathing GI: soft, nontender, nondistended, + BS MS: no deformity or atrophy  Skin: warm and dry, no rash Neuro:  Strength and sensation are intact Psych: euthymic mood, full affect Large wound on the left heel   EKG:  EKG is ordered today. The ekg ordered today demonstrates sinus rhythm with left atrial enlargement.   Recent Labs: 10/10/2020: Magnesium 2.1 12/27/2020: ALT 6 06/04/2021: BUN 35; Creatinine, Ser 1.49; Hemoglobin 11.1; Platelets 236; Potassium 4.4; Sodium 138    Lipid Panel No results found for: CHOL, TRIG, HDL, CHOLHDL, VLDL, LDLCALC, LDLDIRECT    Wt Readings from Last 3 Encounters:  06/04/21 183 lb 9.6 oz (83.3 kg)  05/22/21 185 lb 11.2 oz (84.2 kg)  05/03/21 184 lb 6.4 oz (83.6 kg)      No flowsheet data found.    ASSESSMENT AND PLAN:  1.  Peripheral arterial disease with critical limb ischemia: The patient has nonhealing ulceration on the left heel with osteomyelitis.  Vascular studies  are suggestive of tibial disease.  His left posterior tibial artery appears to be occluded .  I discussed different management options with him and I do think it is worth proceeding with angiography and possible endovascular intervention to improve blood flow and the possibility of healing especially that the patient is trying to avoid amputation at all cost.  However, I did explain to the patient and his wife that even with successful revascularization, amputation might still be needed considering the presence of osteomyelitis.  I discussed the procedure in details as well as risk and benefits.  He does have underlying chronic kidney disease and we will hydrate him for 4 hours before the procedure.  Hold Eliquis 2 days before.  Planned access is via the right common femoral artery.  2.  Parkinson's: Seems to be stable on medications.  3.  Acute DVT post hip surgery: He is currently on anticoagulation with Eliquis.  If endovascular intervention is performed and there is a need for dual antiplatelet therapy, we can likely stop Eliquis or use a reduced dose.    Disposition: Proceed with an angiogram and follow-up after.  Signed,  [name], MD  06/06/2021 9:56 AM    [facility]

## 2021-06-06 ENCOUNTER — Ambulatory Visit: Payer: Medicare Other | Admitting: Infectious Disease

## 2021-06-11 ENCOUNTER — Other Ambulatory Visit: Payer: Self-pay

## 2021-06-11 ENCOUNTER — Telehealth: Payer: Self-pay | Admitting: *Deleted

## 2021-06-11 ENCOUNTER — Encounter (HOSPITAL_BASED_OUTPATIENT_CLINIC_OR_DEPARTMENT_OTHER): Payer: Medicare Other | Attending: Internal Medicine | Admitting: General Surgery

## 2021-06-11 DIAGNOSIS — M86172 Other acute osteomyelitis, left ankle and foot: Secondary | ICD-10-CM | POA: Insufficient documentation

## 2021-06-11 DIAGNOSIS — L89620 Pressure ulcer of left heel, unstageable: Secondary | ICD-10-CM | POA: Insufficient documentation

## 2021-06-11 DIAGNOSIS — G20 Parkinson's disease: Secondary | ICD-10-CM | POA: Diagnosis not present

## 2021-06-11 DIAGNOSIS — Z86718 Personal history of other venous thrombosis and embolism: Secondary | ICD-10-CM | POA: Diagnosis not present

## 2021-06-11 DIAGNOSIS — Z7901 Long term (current) use of anticoagulants: Secondary | ICD-10-CM | POA: Diagnosis not present

## 2021-06-11 DIAGNOSIS — I70245 Atherosclerosis of native arteries of left leg with ulceration of other part of foot: Secondary | ICD-10-CM | POA: Diagnosis not present

## 2021-06-11 DIAGNOSIS — B9562 Methicillin resistant Staphylococcus aureus infection as the cause of diseases classified elsewhere: Secondary | ICD-10-CM | POA: Diagnosis not present

## 2021-06-11 DIAGNOSIS — I10 Essential (primary) hypertension: Secondary | ICD-10-CM | POA: Diagnosis not present

## 2021-06-11 DIAGNOSIS — M1990 Unspecified osteoarthritis, unspecified site: Secondary | ICD-10-CM | POA: Insufficient documentation

## 2021-06-11 NOTE — Progress Notes (Signed)
Greg [name] [name] ([id number]) , Visit Report for 06/11/2021 Chief Complaint Document Details Patient Name: Date of Service: Greg [name] [name] Greg K. 06/11/2021 10:45 A M Medical Record Number: [id number] Patient Account Number: [id number] Date of Birth/Sex: Treating RN: [birth year] (88 y.o. M) Primary Care Provider: Veleta [name] Other Clinician: Referring Provider: Treating Provider/Extender: Greg [name] [name] in Treatment: 22 Information Obtained from: Patient Chief Complaint 01/04/2021; patient is here for review of a unstageable pressure ulcer on the left heel Electronic Signature(s) Signed: 06/11/2021 11:38:27 AM By: Greg [name] Maudlin MD FACS Entered By: Greg [name] [name] on 06/11/2021 11:38:26 -------------------------------------------------------------------------------- Debridement Details Patient Name: Date of Service: Greg [name] [name], Greg [name] Greg K. 06/11/2021 10:45 A M Medical Record Number: [id number] Patient Account Number: [id number] Date of Birth/Sex: Treating RN: Greg [name] [name] (88 y.o. [name] Primary Care Provider: Veleta [name] Other Clinician: Referring Provider: Treating Provider/Extender: Greg [name] [name] in Treatment: 22 Debridement Performed for Assessment: Wound #1 Left Calcaneus Performed By: Physician Greg [name] Maudlin, MD Debridement Type: Debridement Level of Consciousness (Pre-procedure): Awake and Alert Pre-procedure Verification/Time Out Yes - 11:18 Taken: Start Time: 11:18 Pain Control: Other : benzocaine T Area Debrided (L x W): otal 6.4 (cm) x 6 (cm) = 38.4 (cm) Tissue and other material debrided: Non-Viable, Fat, Slough, Subcutaneous, Skin: Dermis , Skin: Epidermis, Slough Level: Skin/Subcutaneous Tissue Debridement Description: Excisional Instrument: Curette Bleeding: Minimum Hemostasis Achieved: Pressure End Time: 11:19 Procedural Pain: 1 Post Procedural Pain: 1 Response to Treatment: Procedure was tolerated  well Level of Consciousness (Post- Awake and Alert procedure): Post Debridement Measurements of Total Wound Length: (cm) 6.4 Stage: Unstageable/Unclassified Width: (cm) 6 Depth: (cm) 0.3 Volume: (cm) 9.048 Character of Wound/Ulcer Post Debridement: Improved Post Procedure Diagnosis Same as Pre-procedure Electronic Signature(s) Signed: 06/11/2021 11:52:54 AM By: Greg [name] Maudlin MD FACS Signed: 06/11/2021 7:06:00 PM By: Greg [name] Catholic RN Entered By: Greg [name] [name] on 06/11/2021 11:23:21 -------------------------------------------------------------------------------- HPI Details Patient Name: Date of Service: Greg [name] [name], Greg [name] Greg K. 06/11/2021 10:45 A M Medical Record Number: [id number] Patient Account Number: [id number] Date of Birth/Sex: Treating RN: [name] (88 y.o. M) Primary Care Provider: Veleta [name] Other Clinician: Referring Provider: Treating Provider/Extender: Greg [name] [name] in Treatment: 62 History of Present Illness HPI Description: ADMISSION 01/04/2021 Greg [name] [name] is a pleasant 88 year old woman who lives at [name] in the skilled facility. He is accompanied by his wife. I did previously been notified by Greg [name] [name] primary physician about this patient. He apparently fractured his right acetabulum in July. Sometime in the recovery of this developed a pressure ulcer on the right heel. When I was first contacted by Greg [name] [name] this was described as a necrotic wound and indeed this is an accurate description. Unfortunately cultures of this have shown MRSA and after 1 negative x-ray a subsequent x-ray on 01/02/2021 showed changes of osteomyelitis. The patient was started on IV vancomycin at the facility and I believe is also on oral trimethoprim/sulfamethoxazole. They are using silver alginate which was done at my suggestion. Sedimentation rate and the said facility was 55 CRP P was also elevated at 73.7 compatible with his osteomyelitis Past medical  history includes a right acetabular fracture, DVT in the right femoral vein also in September on [name], fairly severe Parkinson's disease. He has had [name] in the skilled facility. ABI in our clinic was noncompressible on the left 10/11; patient presents for follow-up. He has no issues or complaints today. He saw Dr. Tommy [name] on 10/7. He is currently on IV  vancomycin. He currently denies systemic signs of infection. 10/18 remains on IV vancomycin. Santyl and silver alginate to the wound bed. He is at the nursing home section of [name]. His wound has made some minor progress there is not as much necrotic tissue and some viable tissue on the surface however there is still an odor. The patient had noninvasive arterial tests done at my suggestion. On the left his ABI was noncompressible his TBI was 0.37 with monophasic and biphasic waveforms. Arterial Doppler showed monophasic waveforms at the ATA and PTA. However there was no focally hemodynamic significant stenosis identified. 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 Remains on IV vancomycin difficult to get consistent answers but it seems like they were not interested in the vascular surgery consult at this time. The wound itself somewhat improved in terms of the amount of necrotic material but still a very difficult necrotic wound. There is exposed bone. We are using Santyl and silver alginate 11/15; he has completed her IV antibiotics and now is on trimethoprim sulfamethoxazole although I did not look over infectious disease notes. The wound is not really improved in size although the condition of the wound surface looks somewhat better there is still exposed bone. We have been using Santyl and silver alginate being changed at friend's home I think it is quite possible the patient has significant PAD however the patient and his wife were not interested in consideration of a vascular referral therefore  I have not made this 12/6; 3-week follow-up at their request. The patient completed IV antibiotics and apparently is on [name]. He lives in the skilled area of [name]. We tried to switch him to [facility] last time apparently that is not on their formulary and he was "unaffordable" per the information we got from the facility. Therefore still on Santyl and silver alginate. They are apparently rigorously offloading the heel when in bed. His wife had some questions about walking with physical therapy. As long as this is not putting undue pressure on the Achilles part of his heel where the wound is this should not be a problem 12/28; Still on Greg [name] regular strength twice a day. Apparently this has been extended by Dr. Drucilla Schmidt. We are using Santyl under [facility]. We were never able to get a copy of [name] formulary. 1/24; 1 month follow-up. Follows with Dr. Drucilla Schmidt in March. Still using Santyl and Hydrofera Blue. Although the wound surface does not look so bad there is extension of this wound superiorly up the back of his Achilles tendon. There is some very firm material which I think is probably calcification rather than bone. 2/7; 2-week follow-up. Reviewed using Santyl and Hydrofera Blue for a prolonged period. 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 Peroneal artery in the mid aspect was not visualized. Both the patient and his wife previously had refused any suggestion of an aggressive approach to the vascular issue however I do not think we are going to heal this wound if the blood flow is obviously inadequate. I explained to him the neck step would be a consultation to a vascular interventional list for consideration of angiography and he wants to proceed with this as does his wife. The wound is stalled. We made considerable improvement with the  obvious infection when he first came in he has been treated for osteomyelitis and we have  managed to get necrotic tissue off the surface of this however not making any further improvement. 2/21; 2 week follow-up. The patient has follow-ups with Dr. Drucilla Schmidt on March 9 and has a consult with Dr. Cicero Duck that we have arranged on February 28. This is a patient came in with a necrotic wound with underlying osteomyelitis. He had significant arterial disease however initially the patient did not want any consideration of an invasive workup. Changed about a month ago and that's the reason for the consult with Dr. Cicero Duck 06/11/2021: 2-week follow-up. The patient saw Dr. Fletcher Anon on February 28 and is scheduled to undergo an aortogram with possible angioplasty or stenting tomorrow. Follow-up with Dr. Tommy [name] is scheduled for March 16. T oday, the wound measures a bit larger. The intake nurse reported significant odor and drainage, but once the dressing was removed, I did not appreciate any significant odor. There is little necrotic tissue, but there does remain some dead fat and slough as well as subcutaneum. This was debrided. He has been in [employer] over [place]. Electronic Signature(s) Signed: 06/11/2021 11:42:59 AM By: Greg [name] Maudlin MD FACS Entered By: Greg [name] [name] on 06/11/2021 11:42:59 -------------------------------------------------------------------------------- Physical Exam Details Patient Name: Date of Service: Greg [name] [name], Greg [name] Greg K. 06/11/2021 10:45 A M Medical Record Number: [id number] Patient Account Number: [id number] Date of Birth/Sex: Treating RN: [name] (88 y.o. M) Primary Care Provider: Veleta [name] Other Clinician: Referring Provider: Treating Provider/Extender: Greg [name] [name] in Treatment: 22 Constitutional Within normal range for this patient.. . . . No acute distress. Respiratory . Notes 06/11/2021: Wound examthe wound surface is fairly clean with only a  bit of necrotic fat at the posterior aspect of the wound. A little bit of dead subcutaneous tissue and slough. This was removed with a curette. No overt evidence of infection. Electronic Signature(s) Signed: 06/11/2021 11:44:29 AM By: Greg [name] Maudlin MD FACS Entered By: Greg [name] [name] on 06/11/2021 11:44:28 -------------------------------------------------------------------------------- Physician Orders Details Patient Name: Date of Service: Greg [name] [name], Greg [name] Greg K. 06/11/2021 10:45 A M Medical Record Number: [id number] Patient Account Number: [id number] Date of Birth/Sex: Treating RN: [birth year] [name] (88 y.o. [name] Primary Care Provider: Veleta [name] Other Clinician: Referring Provider: Treating Provider/Extender: Greg [name] [name] in Treatment: 22 Verbal / Phone Orders: No Diagnosis Coding ICD-10 Coding Code Description L89.620 Pressure ulcer of left heel, unstageable M86.172 Other acute osteomyelitis, left ankle and foot B95.62 Methicillin resistant Staphylococcus aureus infection as the cause of diseases classified elsewhere I70.245 Atherosclerosis of native arteries of left leg with ulceration of other part of foot Follow-up Appointments ppointment in 2 weeks. - Dr Celine Ahr Return A Bathing/ Shower/ Hygiene May shower with protection but do not get wound dressing(s) wet. - May lotion leg and foot to help moisturize use a product like Aquaphor or Ammonium lactate Edema Control - Lymphedema / SCD / Other Elevate legs to the level of the heart or above for 30 minutes daily and/or when sitting, a frequency of: - throughout the day Exercise regularly - May walk with PT trying to avoid using shoes that apply pressure to heels. Off-Loading Turn and reposition every 2 hours Other: - Float heels off of bed/chair with pillow under calves. ******Walk with the offloading (of heel) shoe on left foot******** Wound Treatment Wound #1 - Calcaneus Wound Laterality:  Left Cleanser: Wound Cleanser Every Other Day/30 Days Discharge Instructions: Cleanse the wound with wound cleanser prior to applying a clean dressing using gauze sponges, not tissue or cotton balls. Peri-Wound  Care: Sween Lotion (Moisturizing lotion) Every Other Day/30 Days Discharge Instructions: Apply moisturizing lotion as directed Prim Dressing: Hydrofera Blue Ready Foam, 2.5 x2.5 in Every Other Day/30 Days ary Discharge Instructions: Preferred, may use Silver Alginate if cannot get HFB Prim Dressing: Santyl Ointment Every Other Day/30 Days ary Discharge Instructions: Apply nickel thick amount to wound bed, under silver alginate Secondary Dressing: Woven Gauze Sponge, Non-Sterile 4x4 in Every Other Day/30 Days Discharge Instructions: Apply over primary dressing as directed. Secondary Dressing: ALLEVYN Heel 4 1/2in x 5 1/2in / 10.5cm x 13.5cm Every Other Day/30 Days Discharge Instructions: Apply over primary dressing as directed. Secured With: [employer], 4.5x3.1 (in/yd) Every Other Day/30 Days Discharge Instructions: Secure with Kerlix as directed. Secured With: Paper Tape, 2x10 (in/yd) Every Other Day/30 Days Discharge Instructions: Secure dressing with tape as directed. Electronic Signature(s) Signed: 06/11/2021 11:52:54 AM By: Greg [name] Maudlin MD FACS Entered By: Greg [name] [name] on 06/11/2021 11:45:30 -------------------------------------------------------------------------------- Problem List Details Patient Name: Date of Service: Greg [name] [name], Greg [name] Greg K. 06/11/2021 10:45 A M Medical Record Number: [id number] Patient Account Number: [id number] Date of Birth/Sex: Treating RN: [name] (88 y.o. M) Primary Care Provider: Veleta [name] Other Clinician: Referring Provider: Treating Provider/Extender: Greg [name] [name] in Treatment: 22 Active Problems ICD-10 Encounter Code Description Active Date MDM Diagnosis L89.620 Pressure ulcer of left heel,  unstageable 01/04/2021 No Yes M86.172 Other acute osteomyelitis, left ankle and foot 01/04/2021 No Yes B95.62 Methicillin resistant Staphylococcus aureus infection as the cause of diseases 01/04/2021 No Yes classified elsewhere I70.245 Atherosclerosis of native arteries of left leg with ulceration of other part of 05/14/2021 No Yes foot Inactive Problems Resolved Problems Electronic Signature(s) Signed: 06/11/2021 11:37:42 AM By: Greg [name] Maudlin MD FACS Entered By: Greg [name] [name] on 06/11/2021 11:37:42 -------------------------------------------------------------------------------- Progress Note Details Patient Name: Date of Service: Greg [name] [name], Greg [name] Greg K. 06/11/2021 10:45 A M Medical Record Number: [id number] Patient Account Number: [id number] Date of Birth/Sex: Treating RN: [name] (88 y.o. M) Primary Care Provider: Veleta [name] Other Clinician: Referring Provider: Treating Provider/Extender: Greg [name] [name] in Treatment: 22 Subjective Chief Complaint Information obtained from Patient 01/04/2021; patient is here for review of a unstageable pressure ulcer on the left heel History of Present Illness (HPI) ADMISSION 01/04/2021 Greg [name] [name] is a pleasant 88 year old woman who lives at [place] in the skilled facility. He is accompanied by his wife. I did previously been notified by Greg [name] primary physician about this patient. He apparently fractured his right acetabulum in July. Sometime in the recovery of this developed a pressure ulcer on the right heel. When I was first contacted by Greg [name] [name] this was described as a necrotic wound and indeed this is an accurate description. Unfortunately cultures of this have shown MRSA and after 1 negative x-ray a subsequent x-ray on 01/02/2021 showed changes of osteomyelitis. The patient was started on IV vancomycin at the facility and I believe is also on oral trimethoprim/sulfamethoxazole. They are using silver alginate  which was done at my suggestion. Sedimentation rate and the said facility was 55 CRP P was also elevated at 73.7 compatible with his osteomyelitis Past medical history includes a right acetabular fracture, DVT in the right femoral vein also in September on [name], fairly severe Parkinson's disease. He has had [name] in the skilled facility. ABI in our clinic was noncompressible on the left 10/11; patient presents for follow-up. He has no issues or complaints today. He saw Dr. Tommy [name] on 10/7. He is currently  on IV vancomycin. He currently denies systemic signs of infection. 10/18 remains on IV vancomycin. Santyl and silver alginate to the wound bed. He is at the nursing home section of [name]. His wound has made some minor progress there is not as much necrotic tissue and some viable tissue on the surface however there is still an odor. The patient had noninvasive arterial tests done at my suggestion. On the left his ABI was noncompressible his TBI was 0.37 with monophasic and biphasic waveforms. Arterial Doppler showed monophasic waveforms at the ATA and PTA. However there was no focally hemodynamic significant stenosis identified. 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 Remains on IV vancomycin difficult to get consistent answers but it seems like they were not interested in the vascular surgery consult at this time. The wound itself somewhat improved in terms of the amount of necrotic material but still a very difficult necrotic wound. There is exposed bone. We are using Santyl and silver alginate 11/15; he has completed her IV antibiotics and now is on trimethoprim sulfamethoxazole although I did not look over infectious disease notes. The wound is not really improved in size although the condition of the wound surface looks somewhat better there is still exposed bone. We have been using Santyl and silver alginate being changed at friend's home I think  it is quite possible the patient has significant PAD however the patient and his wife were not interested in consideration of a vascular referral therefore I have not made this 12/6; 3-week follow-up at their request. The patient completed IV antibiotics and apparently is on [name]. He lives in the skilled area of [name]. We tried to switch him to [facility] last time apparently that is not on their formulary and he was "unaffordable" per the information we got from the facility. Therefore still on Santyl and silver alginate. They are apparently rigorously offloading the heel when in bed. His wife had some questions about walking with physical therapy. As long as this is not putting undue pressure on the Achilles part of his heel where the wound is this should not be a problem 12/28; Still on Greg [name] regular strength twice a day. Apparently this has been extended by Dr. Drucilla Schmidt. We are using Santyl under [facility]. We were never able to get a copy of [name] formulary. 1/24; 1 month follow-up. Follows with Dr. Drucilla Schmidt in March. Still using Santyl and Hydrofera Blue. Although the wound surface does not look so bad there is extension of this wound superiorly up the back of his Achilles tendon. There is some very firm material which I think is probably calcification rather than bone. 2/7; 2-week follow-up. Reviewed using Santyl and Hydrofera Blue for a prolonged period. 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 Peroneal artery in the mid aspect was not visualized. Both the patient and his wife previously had refused any suggestion of an aggressive approach to the vascular issue however I do not think we are going to heal this wound if the blood flow is obviously inadequate. I explained to him the neck step would be a consultation to a vascular interventional  list for consideration of angiography and he wants to proceed with this as does his wife. The wound is stalled. We made considerable improvement with the obvious infection when he first came in he has been treated for osteomyelitis and  we have managed to get necrotic tissue off the surface of this however not making any further improvement. 2/21; 2 week follow-up. The patient has follow-ups with Dr. Drucilla Schmidt on March 9 and has a consult with Dr. Cicero Duck that we have arranged on February 28. This is a patient came in with a necrotic wound with underlying osteomyelitis. He had significant arterial disease however initially the patient did not want any consideration of an invasive workup. Changed about a month ago and that's the reason for the consult with Dr. Cicero Duck 06/11/2021: 2-week follow-up. The patient saw Dr. Fletcher Anon on February 28 and is scheduled to undergo an aortogram with possible angioplasty or stenting tomorrow. Follow-up with Dr. Tommy [name] is scheduled for March 16. T oday, the wound measures a bit larger. The intake nurse reported significant odor and drainage, but once the dressing was removed, I did not appreciate any significant odor. There is little necrotic tissue, but there does remain some dead fat and slough as well as subcutaneum. This was debrided. He has been in [employer] over [place]. Patient History Information obtained from Patient. Family History Unknown History. Social History Never smoker, Marital Status - Married, Alcohol Use - Never, Drug Use - No History, Caffeine Use - Rarely. Medical History Cardiovascular Patient has history of Hypertension Musculoskeletal Patient has history of Osteoarthritis Medical A Surgical History Notes nd Cardiovascular Hyperlipdemia Objective Constitutional Within normal range for this patient.. No acute distress. Vitals Time Taken: 11:04 AM, Height: 73 in, Weight: 180 lbs, BMI: 23.7, Temperature: 98.5 F, Pulse: 84 bpm, Respiratory  Rate: 18 breaths/min, Blood Pressure: 99/62 mmHg. General Notes: 06/11/2021: Wound examoothe wound surface is fairly clean with only a bit of necrotic fat at the posterior aspect of the wound. A little bit of dead subcutaneous tissue and slough. This was removed with a curette. No overt evidence of infection. Integumentary (Hair, Skin) Wound #1 status is Open. Original cause of wound was Blister. The date acquired was: 10/24/2020. The wound has been in treatment 22 weeks. The wound is located on the Left Calcaneus. The wound measures 6.4cm length x 6cm width x 0.3cm depth; 30.159cm^2 area and 9.048cm^3 volume. There is Fat Layer (Subcutaneous Tissue) exposed. There is a large amount of serosanguineous drainage noted. The wound margin is distinct with the outline attached to the wound base. There is large (67-100%) pink, friable granulation within the wound bed. There is a small (1-33%) amount of necrotic tissue within the wound bed including Adherent Slough. General Notes: Periwound has macerated skin Assessment Active Problems ICD-10 Pressure ulcer of left heel, unstageable Other acute osteomyelitis, left ankle and foot Methicillin resistant Staphylococcus aureus infection as the cause of diseases classified elsewhere Atherosclerosis of native arteries of left leg with ulceration of other part of foot Procedures Wound #1 Pre-procedure diagnosis of Wound #1 is a Pressure Ulcer located on the Left Calcaneus . There was a Excisional Skin/Subcutaneous Tissue Debridement with a total area of 38.4 sq cm performed by Greg [name] Maudlin, MD. With the following instrument(s): Curette to remove Non-Viable tissue/material. Material removed includes Fat, Subcutaneous Tissue, Slough, Skin: Dermis, and Skin: Epidermis after achieving pain control using Other (benzocaine). No specimens were taken. A time out was conducted at 11:18, prior to the start of the procedure. A Minimum amount of bleeding was controlled  with Pressure. The procedure was tolerated well with a pain level of 1 throughout and a pain level of 1 following the procedure. Post Debridement Measurements: 6.4cm length x 6cm width x  0.3cm depth; 9.048cm^3 volume. Post debridement Stage noted as Unstageable/Unclassified. Character of Wound/Ulcer Post Debridement is improved. Post procedure Diagnosis Wound #1: Same as Pre-Procedure Plan Follow-up Appointments: Return Appointment in 2 weeks. - Dr Celine Ahr Bathing/ Shower/ Hygiene: May shower with protection but do not get wound dressing(s) wet. - May lotion leg and foot to help moisturize use a product like Aquaphor or Ammonium lactate Edema Control - Lymphedema / SCD / Other: Elevate legs to the level of the heart or above for 30 minutes daily and/or when sitting, a frequency of: - throughout the day Exercise regularly - May walk with PT trying to avoid using shoes that apply pressure to heels. Off-Loading: Turn and reposition every 2 hours Other: - Float heels off of bed/chair with pillow under calves. ******Walk with the offloading (of heel) shoe on left foot******** WOUND #1: - Calcaneus Wound Laterality: Left Cleanser: Wound Cleanser Every Other Day/30 Days Discharge Instructions: Cleanse the wound with wound cleanser prior to applying a clean dressing using gauze sponges, not tissue or cotton balls. Peri-Wound Care: Sween Lotion (Moisturizing lotion) Every Other Day/30 Days Discharge Instructions: Apply moisturizing lotion as directed Prim Dressing: Hydrofera Blue Ready Foam, 2.5 x2.5 in Every Other Day/30 Days ary Discharge Instructions: Preferred, may use Silver Alginate if cannot get HFB Prim Dressing: Santyl Ointment Every Other Day/30 Days ary Discharge Instructions: Apply nickel thick amount to wound bed, under silver alginate Secondary Dressing: Woven Gauze Sponge, Non-Sterile 4x4 in Every Other Day/30 Days Discharge Instructions: Apply over primary dressing as  directed. Secondary Dressing: ALLEVYN Heel 4 1/2in x 5 1/2in / 10.5cm x 13.5cm Every Other Day/30 Days Discharge Instructions: Apply over primary dressing as directed. Secured With: [employer], 4.5x3.1 (in/yd) Every Other Day/30 Days Discharge Instructions: Secure with Kerlix as directed. Secured With: Paper T ape, 2x10 (in/yd) Every Other Day/30 Days Discharge Instructions: Secure dressing with tape as directed. 06/12/2021: The wound surface is fairly clean with only a bit of necrotic fat at the posterior aspect of the wound. A little bit of dead subcutaneous tissue and slough. This was removed with a curette. No overt evidence of infection. He is scheduled to undergo an aortogram with potential intervention tomorrow with Dr. Fletcher Anon. I think really the only hope of potentially healing this wound and avoiding an amputation will be to establish better blood flow to the site. He will see Dr. Drucilla Schmidt on 16 March. He will follow-up with me in 2 weeks. Continue current regimen of Hydrofera Blue over [place]. He should continue to offload is much as possible, floating his heels when in bed, elevating his legs when sitting, and his offloading shoe. He may continue to walk with his walker, so long as he is wearing the shoe. Electronic Signature(s) Signed: 06/11/2021 11:52:04 AM By: Greg [name] Maudlin MD FACS Entered By: Greg [name] [name] on 06/11/2021 11:52:03 -------------------------------------------------------------------------------- HxROS Details Patient Name: Date of Service: Greg [name] [name], Greg [name] Greg K. 06/11/2021 10:45 A M Medical Record Number: [id number] Patient Account Number: [id number] Date of Birth/Sex: Treating RN: [name] (88 y.o. M) Primary Care Provider: Veleta [name] Other Clinician: Referring Provider: Treating Provider/Extender: Greg [name] [name] in Treatment: 38 Information Obtained From Patient Cardiovascular Medical History: Positive for: Hypertension Past  Medical History Notes: Hyperlipdemia Musculoskeletal Medical History: Positive for: Osteoarthritis Immunizations Pneumococcal Vaccine: Received Pneumococcal Vaccination: Yes Received Pneumococcal Vaccination On or After 60th Birthday: Yes Implantable Devices None Family and Social History Unknown History: Yes; Never smoker; Marital Status - Married; Alcohol Use: Never; Drug Use:  No History; Caffeine Use: Rarely; Financial Concerns: No; Food, Clothing or Shelter Needs: No; Support System Lacking: No; Transportation Concerns: No Physician Affirmation I have reviewed and agree with the above information. Electronic Signature(s) Signed: 06/11/2021 11:52:54 AM By: Greg [name] Maudlin MD FACS Entered By: Greg [name] [name] on 06/11/2021 11:43:10 -------------------------------------------------------------------------------- SuperBill Details Patient Name: Date of Service: Greg [name] [name], Greg [name] Greg K. 06/11/2021 Medical Record Number: [id number] Patient Account Number: [id number] Date of Birth/Sex: Treating RN: [name] (88 y.o. M) Primary Care Provider: Veleta [name] Other Clinician: Referring Provider: Treating Provider/Extender: Greg [name] [name] in Treatment: 22 Diagnosis Coding ICD-10 Codes Code Description [phone] Pressure ulcer of left heel, unstageable M86.172 Other acute osteomyelitis, left ankle and foot B95.62 Methicillin resistant Staphylococcus aureus infection as the cause of diseases classified elsewhere I70.245 Atherosclerosis of native arteries of left leg with ulceration of other part of foot Facility Procedures CPT4 Code: [id number] Description: [id number] - DEB SUBQ TISSUE 20 SQ CM/< ICD-10 Diagnosis Description L89.620 Pressure ulcer of left heel, unstageable Modifier: Quantity: 1 CPT4 Code: [id number] Description: [id number] - DEB SUBQ TISS EA ADDL 20CM ICD-10 Diagnosis Description L89.620 Pressure ulcer of left heel, unstageable Modifier: Quantity: 1 Physician  Procedures : CPT4 Code Description Modifier [id number] - WC PHYS SUBQ TISS 20 SQ CM ICD-10 Diagnosis Description L89.620 Pressure ulcer of left heel, unstageable Quantity: 1 : [id number] - WC PHYS SUBQ TISS EA ADDL 20 CM 1 ICD-10 Diagnosis Description L89.620 Pressure ulcer of left heel, unstageable Quantity: Electronic Signature(s) Signed: 06/11/2021 11:52:19 AM By: Greg [name] Maudlin MD FACS Entered By: Greg [name] [name] on 06/11/2021 11:52:19

## 2021-06-11 NOTE — Telephone Encounter (Addendum)
Abdominal aortogram  scheduled at [facility] for: 10:30 AM ?Arrival time and place: [facility] Main Entrance A  at: 6:30 AM-hydration ? ? ?No solid food after midnight prior to cath, clear liquids until 5 AM day of procedure. ? ?Medication instructions: ?-Hold: ? Eliquis-none 06/10/21 until post procedure ? Torsemide-day before and day of procedure-per protocol -GFR 45 ?-Except hold medications usual morning medications can be taken with sips of water including aspirin 81 mg. ? ?Confirmed patient has responsible adult to drive home post procedure and be with patient first 24 hours after arriving home: ? ?One visitor is allowed to stay in the waiting room during the time you are at the hospital for your procedure.  ? ?Reviewed instructions with patient's wife.  ? ? ?

## 2021-06-11 NOTE — Progress Notes (Signed)
TAKERU BOSE ([id number]) , Visit Report for 06/11/2021 Arrival Information Details Patient Name: Date of Service: Greg [name] GER K. 06/11/2021 10:45 A M Medical Record Number: [id number] Patient Account Number: [id number] Date of Birth/Sex: Treating RN: 1931/11/30 (87 y.o. Greg [name] Primary Care Brigida Scotti: Veleta Miners Other Clinician: Referring Mosi Hannold: Treating Padme Arriaga/Extender: Jaci Lazier in Treatment: 34 Visit Information History Since Last Visit Added or deleted any medications: No Patient Arrived: Wheel Chair Any new allergies or adverse reactions: No Arrival Time: 11:02 Had a fall or experienced change in No Accompanied By: wife activities of daily living that may affect Transfer Assistance: Manual risk of falls: Patient Requires Transmission-Based Precautions: No Hospitalized since last visit: No Patient Has Alerts: Yes Implantable device outside of the clinic excluding No Patient Alerts: Patient on Blood Thinner cellular tissue based products placed in the center L ABI non compressible since last visit: Has Dressing in Place as Prescribed: Yes Pain Present Now: Yes Electronic Signature(s) Signed: 06/11/2021 7:06:00 PM By: Dellie Catholic RN Entered By: Dellie Catholic on 06/11/2021 11:04:00 -------------------------------------------------------------------------------- Encounter Discharge Information Details Patient Name: Date of Service: Greg [name], Greg GER K. 06/11/2021 10:45 A M Medical Record Number: [id number] Patient Account Number: [id number] Date of Birth/Sex: Treating RN: 1931/12/18 (88 y.o. Greg [name] Primary Care Aidee Latimore: Veleta Miners Other Clinician: Referring Calum Cormier: Treating Lendy Dittrich/Extender: Jaci Lazier in Treatment: 22 Encounter Discharge Information Items Post Procedure Vitals Discharge Condition: Stable Temperature (F): 98.5 Ambulatory Status: Wheelchair Pulse (bpm):  84 Discharge Destination: [place] Respiratory Rate (breaths/min): 18 Telephoned: No Blood Pressure (mmHg): 99/62 Orders Sent: Yes Transportation: Other Accompanied By: self Schedule Follow-up Appointment: Yes Clinical Summary of Care: Patient Declined Electronic Signature(s) Signed: 06/11/2021 7:06:00 PM By: Dellie Catholic RN Entered By: Dellie Catholic on 06/11/2021 11:58:17 -------------------------------------------------------------------------------- Lower Extremity Assessment Details Patient Name: Date of Service: Greg [name], Greg GER K. 06/11/2021 10:45 A M Medical Record Number: [id number] Patient Account Number: [id number] Date of Birth/Sex: Treating RN: [birth year] (88 y.o. Greg [name] Primary Care Katalina Magri: Veleta Miners Other Clinician: Referring Chrisma Hurlock: Treating Paton Crum/Extender: Jari Sportsman Weeks in Treatment: 22 Edema Assessment Assessed: [name]: No] [name]: No] Edema: [Left: Ye] [Right: s] Calf Left: Right: Point of Measurement: 36 cm From Medial Instep 44.2 cm Ankle Left: Right: Point of Measurement: 11 cm From Medial Instep 33 cm Electronic Signature(s) Signed: 06/11/2021 7:06:00 PM By: Dellie Catholic RN Entered By: Dellie Catholic on 06/11/2021 11:10:09 -------------------------------------------------------------------------------- Multi Wound Chart Details Patient Name: Date of Service: Greg [name], Greg GER K. 06/11/2021 10:45 A M Medical Record Number: [id number] Patient Account Number: [id number] Date of Birth/Sex: Treating RN: 1931-11-29 (88 y.o. M) M) Primary Care Anthem Frazer: Veleta Miners Other Clinician: Referring Tatiyanna Lashley: Treating Breeana Sawtelle/Extender: Jaci Lazier in Treatment: 22 Vital Signs Height(in): 73 Pulse(bpm): 84 Weight(lbs): 180 Blood Pressure(mmHg): 99/62 Body Mass Index(BMI): 23.7 Temperature(F): 98.5 Respiratory Rate(breaths/min): 18 Photos: [N/A:N/A] Left Calcaneus  N/A N/A Wound Location: Blister N/A N/A Wounding Event: Pressure Ulcer N/A N/A Primary Etiology: Hypertension, Osteoarthritis N/A N/A Comorbid History: 10/24/2020 N/A N/A Date Acquired: 22 N/A N/A Weeks of Treatment: Open N/A N/A Wound Status: No N/A N/A Wound Recurrence: 6.4x6x0.3 N/A N/A Measurements L x W x D (cm) 30.159 N/A N/A A (cm) : rea 9.048 N/A N/A Volume (cm) : 36.00% N/A N/A % Reduction in A rea: -92.00% N/A N/A % Reduction in Volume: Unstageable/Unclassified N/A N/A Classification: Large N/A N/A Exudate A mount: Serosanguineous N/A N/A Exudate Type: red,  brown N/A N/A Exudate Color: Distinct, outline attached N/A N/A Wound Margin: Large (67-100%) N/A N/A Granulation A mount: Pink, Friable N/A N/A Granulation Quality: Small (1-33%) N/A N/A Necrotic A mount: Fat Layer (Subcutaneous Tissue): Yes N/A N/A Exposed Structures: Fascia: No Tendon: No Muscle: No Joint: No Bone: No Small (1-33%) N/A N/A Epithelialization: Debridement - Excisional N/A N/A Debridement: Pre-procedure Verification/Time Out 11:18 N/A N/A Taken: Other N/A N/A Pain Control: Fat, Subcutaneous, Slough N/A N/A Tissue Debrided: Skin/Subcutaneous Tissue N/A N/A Level: 38.4 N/A N/A Debridement A (sq cm): rea Curette N/A N/A Instrument: Minimum N/A N/A Bleeding: Pressure N/A N/A Hemostasis A chieved: 1 N/A N/A Procedural Pain: 1 N/A N/A Post Procedural Pain: Procedure was tolerated well N/A N/A Debridement Treatment Response: 6.4x6x0.3 N/A N/A Post Debridement Measurements L x W x D (cm) 9.048 N/A N/A Post Debridement Volume: (cm) Unstageable/Unclassified N/A N/A Post Debridement Stage: Periwound has macerated skin N/A N/A Assessment Notes: Debridement N/A N/A Procedures Performed: Treatment Notes Electronic Signature(s) Signed: 06/11/2021 11:38:14 AM By: Fredirick Maudlin MD FACS Entered By: Fredirick Maudlin on 06/11/2021  11:38:13 -------------------------------------------------------------------------------- Multi-Disciplinary Care Plan Details Patient Name: Date of Service: Greg [name], Greg GER K. 06/11/2021 10:45 A M Medical Record Number: [id number] Patient Account Number: [id number] Date of Birth/Sex: Treating RN: 1931/12/21 (88 y.o. Greg [name] Primary Care Zuria Fosdick: Veleta Miners Other Clinician: Referring Armend Hochstatter: Treating Sony Schlarb/Extender: Jaci Lazier in Treatment: [place] reviewed with physician Active Inactive Wound/Skin Impairment Nursing Diagnoses: Impaired tissue integrity Knowledge deficit related to ulceration/compromised skin integrity Goals: Patient/caregiver will verbalize understanding of skin care regimen Date Initiated: 01/04/2021 Date Inactivated: 02/05/2021 Target Resolution Date: 02/01/2021 Goal Status: Met Ulcer/skin breakdown will have a volume reduction of 30% by week 4 Date Initiated: 02/05/2021 Date Inactivated: 04/03/2021 Target Resolution Date: 03/12/2021 Goal Status: Met Ulcer/skin breakdown will have a volume reduction of 50% by week 8 Date Initiated: 04/03/2021 Target Resolution Date: 07/12/2021 Goal Status: Active Interventions: Assess patient/caregiver ability to obtain necessary supplies Assess patient/caregiver ability to perform ulcer/skin care regimen upon admission and as needed Assess ulceration(s) every visit Provide education on ulcer and skin care Notes: 02/05/21: Wound not yet at 30% volume reduction, patient has infection and receiving IV antibiotics. Electronic Signature(s) Signed: 06/11/2021 7:06:00 PM By: Dellie Catholic RN Entered By: Dellie Catholic on 06/11/2021 11:55:42 -------------------------------------------------------------------------------- Pain Assessment Details Patient Name: Date of Service: Greg [name], Greg GER K. 06/11/2021 10:45 A M Medical Record Number: [id number] Patient  Account Number: [id number] Date of Birth/Sex: Treating RN: 1931/06/09 (88 y.o. Greg [name] Primary Care Cinnamon Morency: Veleta Miners Other Clinician: Referring Ebin Palazzi: Treating Elta Angell/Extender: Jaci Lazier in Treatment: 22 Active Problems Location of Pain Severity and Description of Pain Patient Has Paino Yes Site Locations Pain Location: Pain in Ulcers With Dressing Change: Yes Duration of the Pain. Constant / Intermittento Constant Rate the pain. Current Pain Level: 5 Worst Pain Level: 10 Least Pain Level: 1 Tolerable Pain Level: 3 Character of Pain Describe the Pain: Difficult to Pinpoint Pain Management and Medication Current Pain Management: Medication: No Cold Application: No Rest: Yes Massage: No Activity: No T.E.N.S.: No Heat Application: No Leg drop or elevation: No Is the Current Pain Management Adequate: Adequate How does your wound impact your activities of daily livingo Sleep: No Bathing: No Appetite: No Relationship With Others: No Bladder Continence: No Emotions: No Bowel Continence: No Work: No Toileting: No Drive: No Dressing: No Hobbies: No Electronic Signature(s) Signed: 06/11/2021 7:06:00 PM By: Dellie Catholic  RN Entered By: Dellie Catholic on 06/11/2021 11:08:49 -------------------------------------------------------------------------------- Patient/Caregiver Education Details Patient Name: Date of Service: Greg [name], Greg GER K. 3/7/2023andnbsp10:45 [place] Number: [id number] Patient Account Number: [id number] Date of Birth/Gender: Treating RN: 08-25-1931 (88 y.o. Greg [name] Primary Care Physician: Veleta Miners Other Clinician: Referring Physician: Treating Physician/Extender: Jaci Lazier in Treatment: 22 Education Assessment Education Provided To: Patient Education Topics Provided Wound/Skin Impairment: Methods: Explain/Verbal Responses: Return  demonstration correctly Electronic Signature(s) Signed: 06/11/2021 7:06:00 PM By: Dellie Catholic RN Entered By: Dellie Catholic on 06/11/2021 11:56:03 -------------------------------------------------------------------------------- Wound Assessment Details Patient Name: Date of Service: Greg [name], Greg GER K. 06/11/2021 10:45 A M Medical Record Number: [id number] Patient Account Number: [id number] Date of Birth/Sex: Treating RN: [birth year] (88 y.o. M) Primary Care Preet Perrier: Veleta Miners Other Clinician: Referring Kian Gamarra: Treating Johsua Shevlin/Extender: Jaci Lazier in Treatment: 22 Wound Status Wound Number: 1 Primary Etiology: Pressure Ulcer Wound Location: Left Calcaneus Wound Status: Open Wounding Event: Blister Comorbid History: Hypertension, Osteoarthritis Date Acquired: 10/24/2020 Weeks Of Treatment: 22 Clustered Wound: No Photos Wound Measurements Length: (cm) 6.4 Width: (cm) 6 Depth: (cm) 0.3 Area: (cm) 30.159 Volume: (cm) 9.048 % Reduction in Area: 36% % Reduction in Volume: -92% Epithelialization: Small (1-33%) Wound Description Classification: Unstageable/Unclassified Wound Margin: Distinct, outline attached Exudate Amount: Large Exudate Type: Serosanguineous Exudate Color: red, brown Foul Odor After Cleansing: No Slough/Fibrino Yes Wound Bed Granulation Amount: Large (67-100%) Exposed Structure Granulation Quality: Pink, Friable Fascia Exposed: No Necrotic Amount: Small (1-33%) Fat Layer (Subcutaneous Tissue) Exposed: Yes Necrotic Quality: Adherent Slough Tendon Exposed: No Muscle Exposed: No Joint Exposed: No Bone Exposed: No Assessment Notes Periwound has macerated skin Treatment Notes Wound #1 (Calcaneus) Wound Laterality: Left Cleanser Wound Cleanser Discharge Instruction: Cleanse the wound with wound cleanser prior to applying a clean dressing using gauze sponges, not tissue or cotton balls. Peri-Wound Care Sween  Lotion (Moisturizing lotion) Discharge Instruction: Apply moisturizing lotion as directed Topical Primary Dressing Hydrofera Blue Ready Foam, 2.5 x2.5 in Discharge Instruction: Preferred, may use Silver Alginate if cannot get HFB Santyl Ointment Discharge Instruction: Apply nickel thick amount to wound bed, under silver alginate Secondary Dressing Woven Gauze Sponge, Non-Sterile 4x4 in Discharge Instruction: Apply over primary dressing as directed. ALLEVYN Heel 4 1/2in x 5 1/2in / 10.5cm x 13.5cm Discharge Instruction: Apply over primary dressing as directed. Secured With [employer], 4.5x3.1 (in/yd) Discharge Instruction: Secure with Kerlix as directed. Paper Tape, 2x10 (in/yd) Discharge Instruction: Secure dressing with tape as directed. Compression Wrap Compression Stockings Add-Ons Electronic Signature(s) Signed: 06/11/2021 7:06:00 PM By: Dellie Catholic RN Entered By: Dellie Catholic on 06/11/2021 11:10:54 -------------------------------------------------------------------------------- Vitals Details Patient Name: Date of Service: Greg [name], Greg GER K. 06/11/2021 10:45 A M Medical Record Number: [id number] Patient Account Number: [id number] Date of Birth/Sex: Treating RN: 07-06-1931 (88 y.o. Greg [name] Primary Care Anntonette Madewell: Veleta Miners Other Clinician: Referring Terie Lear: Treating Afsana Liera/Extender: Jaci Lazier in Treatment: 22 Vital Signs Time Taken: 11:04 Temperature (F): 98.5 Height (in): 73 Pulse (bpm): 84 Weight (lbs): 180 Respiratory Rate (breaths/min): 18 Body Mass Index (BMI): 23.7 Blood Pressure (mmHg): 99/62 Reference Range: 80 - 120 mg / dl Electronic Signature(s) Signed: 06/11/2021 7:06:00 PM By: Dellie Catholic RN Entered By: Dellie Catholic on 06/11/2021 11:08:04

## 2021-06-12 ENCOUNTER — Other Ambulatory Visit: Payer: Self-pay

## 2021-06-12 ENCOUNTER — Encounter (HOSPITAL_COMMUNITY)
Admission: RE | Disposition: A | Discharge status: Home / Self Care | Payer: Self-pay | Source: Ambulatory Visit | Attending: Cardiovascular Disease

## 2021-06-12 ENCOUNTER — Ambulatory Visit (HOSPITAL_COMMUNITY)
Admission: RE | Admit: 2021-06-12 | Discharge: 2021-06-12 | Disposition: A | Discharge status: Home / Self Care | Payer: Medicare Other | Source: Ambulatory Visit | Attending: Cardiovascular Disease | Admitting: Cardiovascular Disease

## 2021-06-12 DIAGNOSIS — N189 Chronic kidney disease, unspecified: Secondary | ICD-10-CM | POA: Insufficient documentation

## 2021-06-12 DIAGNOSIS — I70222 Atherosclerosis of native arteries of extremities with rest pain, left leg: Secondary | ICD-10-CM

## 2021-06-12 DIAGNOSIS — I70202 Unspecified atherosclerosis of native arteries of extremities, left leg: Secondary | ICD-10-CM | POA: Diagnosis not present

## 2021-06-12 DIAGNOSIS — Z86718 Personal history of other venous thrombosis and embolism: Secondary | ICD-10-CM | POA: Diagnosis not present

## 2021-06-12 DIAGNOSIS — Z7901 Long term (current) use of anticoagulants: Secondary | ICD-10-CM | POA: Insufficient documentation

## 2021-06-12 DIAGNOSIS — L97429 Non-pressure chronic ulcer of left heel and midfoot with unspecified severity: Secondary | ICD-10-CM | POA: Diagnosis not present

## 2021-06-12 DIAGNOSIS — M86172 Other acute osteomyelitis, left ankle and foot: Secondary | ICD-10-CM | POA: Insufficient documentation

## 2021-06-12 DIAGNOSIS — G20 Parkinson's disease: Secondary | ICD-10-CM | POA: Insufficient documentation

## 2021-06-12 DIAGNOSIS — E785 Hyperlipidemia, unspecified: Secondary | ICD-10-CM | POA: Insufficient documentation

## 2021-06-12 DIAGNOSIS — Z79899 Other long term (current) drug therapy: Secondary | ICD-10-CM | POA: Insufficient documentation

## 2021-06-12 DIAGNOSIS — I70244 Atherosclerosis of native arteries of left leg with ulceration of heel and midfoot: Secondary | ICD-10-CM | POA: Insufficient documentation

## 2021-06-12 DIAGNOSIS — I739 Peripheral vascular disease, unspecified: Secondary | ICD-10-CM

## 2021-06-12 HISTORY — PX: ABDOMINAL AORTOGRAM W/LOWER EXTREMITY: CATH118223

## 2021-06-12 SURGERY — ABDOMINAL AORTOGRAM W/LOWER EXTREMITY
Anesthesia: LOCAL

## 2021-06-12 MED ORDER — SODIUM CHLORIDE 0.9% FLUSH: 3.0000 mL | Freq: Two times a day (BID) | INTRAVENOUS | Status: DC

## 2021-06-12 MED ORDER — SODIUM CHLORIDE 0.9 % IV SOLN
250.0000 mL | INTRAVENOUS | Status: DC | PRN
  Administered 2021-06-12: 250 mL via INTRAVENOUS

## 2021-06-12 MED ORDER — FENTANYL CITRATE (PF) 100 MCG/2ML IJ SOLN
INTRAMUSCULAR | Status: DC | PRN
  Administered 2021-06-12: 25 ug via INTRAVENOUS

## 2021-06-12 MED ORDER — SODIUM CHLORIDE 0.9 % IV SOLN: 250.0000 mL | INTRAVENOUS | Status: DC | PRN

## 2021-06-12 MED ORDER — ACETAMINOPHEN 325 MG PO TABS: 650.0000 mg | ORAL_TABLET | ORAL | Status: DC | PRN

## 2021-06-12 MED ORDER — HEPARIN (PORCINE) IN NACL 1000-0.9 UT/500ML-% IV SOLN
INTRAVENOUS | Status: DC | PRN
Start: 2021-06-12 — End: 2021-06-12
  Administered 2021-06-12 (×2): 500 mL

## 2021-06-12 MED ORDER — LABETALOL HCL 5 MG/ML IV SOLN: 10.0000 mg | INTRAVENOUS | Status: DC | PRN

## 2021-06-12 MED ORDER — LIDOCAINE HCL (PF) 1 % IJ SOLN
INTRAMUSCULAR | Status: DC | PRN
  Administered 2021-06-12: 15 mL

## 2021-06-12 MED ORDER — SODIUM CHLORIDE 0.9 % IV SOLN: INTRAVENOUS | Status: DC

## 2021-06-12 MED ORDER — HEPARIN (PORCINE) IN NACL 1000-0.9 UT/500ML-% IV SOLN
INTRAVENOUS | Status: AC
  Filled 2021-06-12: qty 500

## 2021-06-12 MED ORDER — FENTANYL CITRATE (PF) 100 MCG/2ML IJ SOLN
INTRAMUSCULAR | Status: AC
  Filled 2021-06-12: qty 2

## 2021-06-12 MED ORDER — LIDOCAINE HCL (PF) 1 % IJ SOLN
INTRAMUSCULAR | Status: AC
  Filled 2021-06-12: qty 30

## 2021-06-12 MED ORDER — ASPIRIN 81 MG PO CHEW
81.0000 mg | CHEWABLE_TABLET | ORAL | Status: AC
  Administered 2021-06-12: 81 mg via ORAL
  Filled 2021-06-12: qty 1

## 2021-06-12 MED ORDER — SODIUM CHLORIDE 0.9% FLUSH: 3.0000 mL | INTRAVENOUS | Status: DC | PRN

## 2021-06-12 MED ORDER — IODIXANOL 320 MG/ML IV SOLN
INTRAVENOUS | Status: DC | PRN
  Administered 2021-06-12: 40 mL via INTRA_ARTERIAL

## 2021-06-12 MED ORDER — ONDANSETRON HCL 4 MG/2ML IJ SOLN: 4.0000 mg | Freq: Four times a day (QID) | INTRAMUSCULAR | Status: DC | PRN

## 2021-06-12 SURGICAL SUPPLY — 16 items
CATH ANGIO 5F PIGTAIL 65CM (CATHETERS) ×1 IMPLANT
CATH CROSS OVER TEMPO 5F (CATHETERS) ×1 IMPLANT
CATH NAVICROSS ST 65CM (CATHETERS) IMPLANT
CATH STRAIGHT 5FR 65CM (CATHETERS) ×1 IMPLANT
CATHETER NAVICROSS ST 65CM (CATHETERS) ×2
DEVICE CLOSURE MYNXGRIP 5F (Vascular Products) ×1 IMPLANT
GLIDEWIRE ADV .035X260CM (WIRE) ×1 IMPLANT
GUIDEWIRE ANGLED .035X150CM (WIRE) ×1 IMPLANT
KIT ANGIASSIST CO2 SYSTEM (KITS) ×1 IMPLANT
KIT PV (KITS) ×2 IMPLANT
SHEATH PINNACLE 5F 10CM (SHEATH) ×1 IMPLANT
SHEATH PROBE COVER 6X72 (BAG) ×1 IMPLANT
TRANSDUCER W/STOPCOCK (MISCELLANEOUS) ×2 IMPLANT
TRAY PV CATH (CUSTOM PROCEDURE TRAY) ×2 IMPLANT
WIRE HITORQ VERSACORE ST 145CM (WIRE) ×1 IMPLANT
WIRE MICRO SET SILHO 5FR 7 (SHEATH) ×1 IMPLANT

## 2021-06-12 NOTE — Interval H&P Note (Signed)
History and Physical Interval Note: ? ?06/12/2021 ?11:47 AM ? ?Greg [name]  has presented today for surgery, with the diagnosis of pad.  The various methods of treatment have been discussed with the patient and family. After consideration of risks, benefits and other options for treatment, the patient has consented to  Procedure(s): ?ABDOMINAL AORTOGRAM W/LOWER EXTREMITY (N/A) as a surgical intervention.  The patient's history has been reviewed, patient examined, no change in status, stable for surgery.  I have reviewed the patient's chart and labs.  Questions were answered to the patient's satisfaction.   ? ? ?[name] ? ? ?

## 2021-06-13 ENCOUNTER — Ambulatory Visit: Payer: Medicare Other | Admitting: Infectious Disease

## 2021-06-13 ENCOUNTER — Encounter (HOSPITAL_COMMUNITY): Payer: Self-pay | Admitting: Cardiovascular Disease

## 2021-06-17 ENCOUNTER — Non-Acute Institutional Stay (SKILLED_NURSING_FACILITY): Payer: Medicare Other | Admitting: Orthopedic Surgery

## 2021-06-17 ENCOUNTER — Encounter: Payer: Self-pay | Admitting: Orthopedic Surgery

## 2021-06-17 DIAGNOSIS — M86272 Subacute osteomyelitis, left ankle and foot: Secondary | ICD-10-CM | POA: Diagnosis not present

## 2021-06-17 DIAGNOSIS — E538 Deficiency of other specified B group vitamins: Secondary | ICD-10-CM

## 2021-06-17 DIAGNOSIS — G20 Parkinson's disease: Secondary | ICD-10-CM

## 2021-06-17 DIAGNOSIS — I82411 Acute embolism and thrombosis of right femoral vein: Secondary | ICD-10-CM

## 2021-06-17 DIAGNOSIS — I10 Essential (primary) hypertension: Secondary | ICD-10-CM

## 2021-06-17 DIAGNOSIS — R7303 Prediabetes: Secondary | ICD-10-CM

## 2021-06-17 DIAGNOSIS — M810 Age-related osteoporosis without current pathological fracture: Secondary | ICD-10-CM

## 2021-06-17 DIAGNOSIS — N1832 Chronic kidney disease, stage 3b: Secondary | ICD-10-CM

## 2021-06-17 DIAGNOSIS — I739 Peripheral vascular disease, unspecified: Secondary | ICD-10-CM | POA: Diagnosis not present

## 2021-06-17 DIAGNOSIS — D500 Iron deficiency anemia secondary to blood loss (chronic): Secondary | ICD-10-CM

## 2021-06-17 DIAGNOSIS — R600 Localized edema: Secondary | ICD-10-CM

## 2021-06-17 DIAGNOSIS — K5901 Slow transit constipation: Secondary | ICD-10-CM

## 2021-06-17 NOTE — Progress Notes (Unsigned)
. Location:  [facility] Room Number: N27/A Place of Service:  SNF [phone]) Provider: Octavia Heir, NP  Patient Care Team: [name], MD as PCP - General (Internal Medicine) [name], Octaviano Batty, DO as Consulting Physician (Neurology)  Extended Emergency Contact Information Primary Emergency Contact: Damico,jane Address: [address] [place].          apt [phone] [place] Home Phone: [phone] Mobile Phone: [phone] Relation: Spouse Secondary Emergency Contact: Wooley,kenneth Home Phone: [phone] Mobile Phone: [phone] Relation: Son  Code Status:  Full Code Goals of care: Advanced Directive information Advanced Directives 06/17/2021  Does Patient Have a Medical Advance Directive? Yes  Type of Advance Directive Living will  Does patient want to make changes to medical advance directive? No - Patient declined  Would patient like information on creating a medical advance directive? -     Chief Complaint  Patient presents with   Medical Management of Chronic Issues    Routine visit. Discuss the need for Shingrix vaccine, or post pone if patient refuses.     HPI:  Pt is a 88 y.o. male seen today for medical management of chronic diseases.     Past Medical History:  Diagnosis Date   Acute bronchiolitis    Acute osteomyelitis of calcaneum, left (HCC) 01/11/2021   Anemia    Bursitis of both hips    Hearing reduced    hearing aid   History of basal cell carcinoma    Hyperlipidemia    Malaise and fatigue    MRSA infection 01/11/2021   Osteoarthritis    hand   Osteoporosis    Palpitation    Vitamin D deficiency    Past Surgical History:  Procedure Laterality Date   ABDOMINAL AORTOGRAM W/LOWER EXTREMITY N/A 06/12/2021   Procedure: ABDOMINAL AORTOGRAM W/LOWER EXTREMITY;  Surgeon: Iran Ouch, MD;  Location: [facility];  Service: Cardiovascular;  Laterality: N/A;   CATARACT EXTRACTION  05/31/2015    Eye/right   COLONOSCOPY  02/05/2005, 03/06/2005   normal, 10 years ago   MOHS micrographic surgery     Face surgery   ORIF ACETABULAR FRACTURE Right 10/09/2020   Procedure: OPEN REDUCTION INTERNAL FIXATION (ORIF) ACETABULAR FRACTURE;  Surgeon: [name], MD;  Location: [facility] OR;  Service: Orthopedics;  Laterality: Right;    No Known Allergies  Outpatient Encounter Medications as of 06/17/2021  Medication Sig   Acetaminophen 325 MG CAPS Take 650 mg by mouth every 6 (six) hours as needed (pain or fever).   albuterol (VENTOLIN HFA) 108 (90 Base) MCG/ACT inhaler Inhale 2 puffs into the lungs every 4 (four) hours as needed for wheezing or shortness of breath.   Amino Acids-Protein Hydrolys (FEEDING SUPPLEMENT, PRO-STAT SUGAR FREE 64,) LIQD Take 30 mLs by mouth in the morning and at bedtime.   apixaban (ELIQUIS) 5 MG TABS tablet Take 1 tablet (5 mg total) by mouth 2 (two) times daily.   Calcium Carbonate-Vitamin D (CALCIUM-VITAMIN D3 PO) Take 600 mg by mouth in the morning.   carbidopa-levodopa (SINEMET CR) 50-200 MG tablet Take 1 tablet by mouth at bedtime.   carbidopa-levodopa (SINEMET IR) 25-100 MG tablet Take 1 tablet by mouth 4 (four) times daily.   collagenase (SANTYL) ointment Apply 1 application topically every other day.   ferrous sulfate 325 (65 FE) MG tablet Take 325 mg by mouth every Monday, Wednesday, and Friday.   polyethylene glycol (MIRALAX / GLYCOLAX) 17 g packet Take 17  g by mouth every other day.   senna (SENOKOT) 8.6 MG TABS tablet Take 1 tablet (8.6 mg total) by mouth daily.   sulfamethoxazole-trimethoprim (BACTRIM) 400-80 MG tablet Take 1 tablet by mouth 2 (two) times daily.   torsemide (DEMADEX) 20 MG tablet Take 20 mg by mouth daily. Sun, Mon, Wed, Fri, Sat   Torsemide 40 MG TABS Take by mouth daily. Tues, Thrus   vitamin B-12 (CYANOCOBALAMIN) 1000 MCG tablet Take 1,000 mcg by mouth daily.   Vitamin D, Cholecalciferol, 25 MCG (1000 UT) TABS Take 1,000 Units by mouth daily.    zinc oxide 20 % ointment Apply 1 application topically as needed for irritation. To buttocks after every incontinent episode and as needed for redness.   [DISCONTINUED] torsemide (DEMADEX) 20 MG tablet Take 20-40 mg by mouth See admin instructions. Take 20mg  on Sun, Mon, Wed, Fri, Sat. Take 40mg  on Tue and  Thu. (Patient not taking: Reported on 06/17/2021)   No facility-administered encounter medications on file as of 06/17/2021.    Review of Systems  Immunization History  Administered Date(s) Administered   H1N1 06/13/2008   Influenza Split 03/05/2006, 02/26/2007, 01/26/2008, 01/18/2009, 12/12/2009, 01/16/2011, 01/29/2012, 02/01/2013, 02/08/2014, 02/13/2015, 01/10/2016, 02/23/2017, 02/24/2018, 01/24/2019, 12/22/2019   Influenza, High Dose Seasonal PF 01/30/2021   PFIZER(Purple Top)SARS-COV-2 Vaccination 05/04/2019, 05/25/2019, 09/01/2020   Pfizer Covid-19 Vaccine Bivalent Booster 62yrs & up 12/27/2019, 12/26/2020   Pneumococcal Conjugate-13 02/10/2014   Pneumococcal Polysaccharide-23 06/10/2011   Zoster, Live 07/04/2009   Pertinent  Health Maintenance Due  Topic Date Due   INFLUENZA VACCINE  Completed   Fall Risk 10/12/2020 01/10/2021 03/22/2021 05/03/2021 06/12/2021  Falls in the past year? - 1 0 1 -  Was there an injury with Fall? - 1 0 1 -  Fall Risk Category Calculator - 2 0 2 -  Fall Risk Category - Moderate Low Moderate -  Patient Fall Risk Level High fall risk High fall risk - High fall risk High fall risk  Patient at Risk for Falls Due to - - - History of fall(s);Impaired balance/gait;Impaired mobility -  Patient at Risk for Falls Due to - - - Parkinson's, past hip fracture -  Fall risk Follow up - - - Falls evaluation completed;Education provided;Falls prevention discussed -   Functional Status Survey:    Vitals:   06/17/21 1112  BP: 102/80  Pulse: 86  Resp: 19  Temp: 97.6 F (36.4 C)  SpO2: 97%  Weight: 184 lb 14.4 oz (83.9 kg)  Height: 5\' 9"  (1.753 m)   Body mass  index is 27.3 kg/m. Physical Exam  Labs reviewed: Recent Labs    10/09/20 0546 10/09/20 0937 10/10/20 0607 10/11/20 0037 10/12/20 0146 10/25/20 0000 03/22/21 1141 04/04/21 0000 04/11/21 0000 06/04/21 0957  NA  --    < > 131*   < > 132*   < > 138 139 138 138  K  --    < > 4.2   < > 4.1   < > 4.4 4.4 4.3 4.4  CL  --    < > 97*   < > 97*   < > 101 103 103 97  CO2  --    < > 28   < > 28   < > 26 29* 29* 29  GLUCOSE  --    < > 134*   < > 122*  --  127*  --   --  83  BUN  --    < > 23   < >  24*   < > 48* 39* 37* 35*  CREATININE  --    < > 1.20   < > 1.14   < > 1.65* 1.6* 1.5* 1.49*  CALCIUM  --    < > 8.7*   < > 8.4*   < > 8.6 8.6* 8.3* 8.9  MG  --   --  2.1  --   --   --   --   --   --   --   PHOS 3.4  --   --   --  3.5  --   --   --   --   --    < > = values in this interval not displayed.   Recent Labs    10/09/20 0937 10/11/20 0037 10/12/20 0146 12/26/20 0000 12/27/20 0000  AST 22 34  --  19 18  ALT 7 <5  --  9* 6*  ALKPHOS 31* 24*  --  55 46  BILITOT 1.7* 1.1  --   --   --   PROT 5.9* 4.6*  --   --   --   ALBUMIN 3.4* 2.6* 2.6* 3.7 2.9*   Recent Labs    10/12/20 0146 10/25/20 0000 02/21/21 0000 03/22/21 1141 04/18/21 0000 06/04/21 0957  WBC 8.5   < > 6.5 11.7* 7.1 5.7  NEUTROABS 6.4   < > 4,310.00 9,992* 4,665.00  --   HGB 7.8*   < > 10.5* 9.9* 10.1* 11.1*  HCT 22.8*   < > 33* 31.2* 32* 32.8*  MCV 96.6  --   --  85.7  --  87  PLT 116*   < > 307 222 291 236   < > = values in this interval not displayed.   No results found for: TSH Lab Results  Component Value Date   HGBA1C 5.8 05/23/2021   No results found for: CHOL, HDL, LDLCALC, LDLDIRECT, TRIG, CHOLHDL  Significant Diagnostic Results in last 30 days:  PERIPHERAL VASCULAR CATHETERIZATION  Result Date: 06/12/2021 1.  No significant aortoiliac disease. 2.  Left lower extremity: Heavily calcified vessels throughout with no obstructive disease affecting the SFA or popliteal arteries.  Heavily calcified  disease causing severe stenosis in the proximal and mid posterior tibial artery but with collaterals.  Patent anterior tibial and peroneal arteries. Recommendations: The patient has good inline flow throughout.  Revascularization of the proximal portion of posterior tibial artery will not offer much additional blood flow and will be associated with significant risk of distal embolization considering degree of calcifications. Continue wound care.  No indication for revascularization.    Assessment/Plan There are no diagnoses linked to this encounter.   Family/ staff Communication: ***  Labs/tests ordered:  ***

## 2021-06-18 ENCOUNTER — Encounter: Payer: Self-pay | Admitting: Orthopedic Surgery

## 2021-06-20 ENCOUNTER — Encounter: Payer: Self-pay | Admitting: Infectious Disease

## 2021-06-20 ENCOUNTER — Ambulatory Visit (INDEPENDENT_AMBULATORY_CARE_PROVIDER_SITE_OTHER): Payer: Medicare Other | Admitting: Infectious Disease

## 2021-06-20 ENCOUNTER — Other Ambulatory Visit: Payer: Self-pay

## 2021-06-20 VITALS — BP 119/69 | HR 84 | Temp 98.4°F | Ht 69.0 in | Wt 187.0 lb

## 2021-06-20 DIAGNOSIS — N1832 Chronic kidney disease, stage 3b: Secondary | ICD-10-CM | POA: Diagnosis not present

## 2021-06-20 DIAGNOSIS — I82411 Acute embolism and thrombosis of right femoral vein: Secondary | ICD-10-CM | POA: Diagnosis not present

## 2021-06-20 DIAGNOSIS — M86172 Other acute osteomyelitis, left ankle and foot: Secondary | ICD-10-CM | POA: Diagnosis not present

## 2021-06-20 DIAGNOSIS — A4902 Methicillin resistant Staphylococcus aureus infection, unspecified site: Secondary | ICD-10-CM | POA: Diagnosis not present

## 2021-06-20 DIAGNOSIS — G20 Parkinson's disease: Secondary | ICD-10-CM | POA: Diagnosis not present

## 2021-06-20 NOTE — Progress Notes (Signed)
? ?Subjective:  ?Chief complaint: Follow-up for calcaneal osteomyelitis ? ? Patient ID: Jonetta SpeakRoger K Loux, male    DOB: 1931-04-14, 88 y.o.   MRN: [id number] ? ?HPI ? ?Fredrik CoveRoger is an 88 year-old Caucasian man with a past medical history significant for parkinsonism, chronic kidney disease, hypertension recent fall and hip fracture on the right status post ORIF by Dr. Carola FrostHandy. ? ?He then developed a pressure ulcer apparently on his left heel that has worsened despite wound care at his skilled nursing facility, now with referral to wound care and being followed by Dr. Leanord Hawkingobson. ? ?Ultimately a culture was taken from the wound although I did not initially have access to it which grew methicillin-resistant Staph aureus.  The patient had been on doxycycline and switched to Bactrim making me presume that it was doxycycline resistant. ? ?He was treated with intravenous vancomycin ? ?He was  referred to our clinic for further evaluation and management. ? ?At his first visit with me I was quite clear with him and his wife that his calcaneal osteomyelitis is not curable through antibiotics and that he really needs a below the knee amputation to affect cure. ? ?He was not psychologically ready for that at that time and still remains not ready for below the knee amputation. ? ?He and his wife did understand that there is a risk of this infection spreading from his calcaneus into the bloodstream and causing sepsis which could hospitalize him could end his life, could lead to spread of infection to other organs. ? ?I was able to get the antimicrobial sensitivity data faxed to us and is listed below: ? ? ? ? ?I have placed him on Bactrim double strength tablet twice daily and he is remained on this.  He continues to follow-up with Dr. Leanord Hawkingobson. ? ?Returns to clinic for follow-up today. ? ?His calcaneal osteomyelitis is unsurprisingly not improving. ? ?We had further discussions today I was clear that the only way to cure his infection is for  him to have a below the knee amputation. ? ?He has seen cardiology and had angiography which has shown there is no need for any further revascularization of his lower extremities. ? ? ? ? ? ?Past Medical History:  ?Diagnosis Date  ? Acute bronchiolitis   ? Acute osteomyelitis of calcaneum, left (HCC) 01/11/2021  ? Anemia   ? Bursitis of both hips   ? Hearing reduced   ? hearing aid  ? History of basal cell carcinoma   ? Hyperlipidemia   ? Malaise and fatigue   ? MRSA infection 01/11/2021  ? Osteoarthritis   ? hand  ? Osteoporosis   ? Palpitation   ? Vitamin D deficiency   ? ? ?Past Surgical History:  ?Procedure Laterality Date  ? ABDOMINAL AORTOGRAM W/LOWER EXTREMITY N/A 06/12/2021  ? Procedure: ABDOMINAL AORTOGRAM W/LOWER EXTREMITY;  Surgeon: Iran OuchArida, Muhammad A, MD;  Location: [facility];  Service: Cardiovascular;  Laterality: N/A;  ? CATARACT EXTRACTION  05/31/2015  ? Eye/right  ? COLONOSCOPY  02/05/2005, 03/06/2005  ? normal, 10 years ago  ? MOHS micrographic surgery    ? Face surgery  ? ORIF ACETABULAR FRACTURE Right 10/09/2020  ? Procedure: OPEN REDUCTION INTERNAL FIXATION (ORIF) ACETABULAR FRACTURE;  Surgeon: Myrene GalasHandy, Michael, MD;  Location: [facility] OR;  Service: Orthopedics;  Laterality: Right;  ? ? ?Family History  ?Problem Relation Age of Onset  ? Prostate cancer Father   ? Skin cancer Brother   ? ? ?  ?Social History  ? ?  Socioeconomic History  ? Marital status: Married  ?  Spouse name: [name]  ? Number of children: 2  ? Years of education: Not on file  ? Highest education level: Not on file  ?Occupational History  ? Occupation: retired  ?  Comment: art professor  ?Tobacco Use  ? Smoking status: Never  ? Smokeless tobacco: Never  ?Vaping Use  ? Vaping Use: Never used  ?Substance and Sexual Activity  ? Alcohol use: Never  ? Drug use: Never  ? Sexual activity: Not on file  ?Other Topics Concern  ? Not on file  ?Social History Narrative  ? Pt just move from [place] to [place]  ? Pt leaving with wife  ? ?Social  Determinants of Health  ? ?Financial Resource Strain: Low Risk   ? Difficulty of Paying Living Expenses: Not hard at all  ?Food Insecurity: No Food Insecurity  ? Worried About Programme researcher, broadcasting/film/video in the Last Year: Never true  ? Ran Out of Food in the Last Year: Never true  ?Transportation Needs: No Transportation Needs  ? Lack of Transportation (Medical): No  ? Lack of Transportation (Non-Medical): No  ?Physical Activity: Inactive  ? Days of Exercise per Week: 0 days  ? Minutes of Exercise per Session: 0 min  ?Stress: No Stress Concern Present  ? Feeling of Stress : Not at all  ?Social Connections: Moderately Integrated  ? Frequency of Communication with Friends and Family: More than three times a week  ? Frequency of Social Gatherings with Friends and Family: More than three times a week  ? Attends Religious Services: 1 to 4 times per year  ? Active Member of Clubs or Organizations: No  ? Attends Banker Meetings: Never  ? Marital Status: Married  ? ? ?No Known Allergies ? ? ?Current Outpatient Medications:  ?  Acetaminophen 325 MG CAPS, Take 650 mg by mouth every 6 (six) hours as needed (pain or fever)., Disp: , Rfl:  ?  albuterol (VENTOLIN HFA) 108 (90 Base) MCG/ACT inhaler, Inhale 2 puffs into the lungs every 4 (four) hours as needed for wheezing or shortness of breath., Disp: , Rfl:  ?  Amino Acids-Protein Hydrolys (FEEDING SUPPLEMENT, PRO-STAT SUGAR FREE 64,) LIQD, Take 30 mLs by mouth in the morning and at bedtime., Disp: , Rfl:  ?  apixaban (ELIQUIS) 5 MG TABS tablet, Take 1 tablet (5 mg total) by mouth 2 (two) times daily., Disp: 60 tablet, Rfl: 3 ?  Calcium Carbonate-Vitamin D (CALCIUM-VITAMIN D3 PO), Take 600 mg by mouth in the morning., Disp: , Rfl:  ?  carbidopa-levodopa (SINEMET CR) 50-200 MG tablet, Take 1 tablet by mouth at bedtime., Disp: 90 tablet, Rfl: 1 ?  carbidopa-levodopa (SINEMET IR) 25-100 MG tablet, Take 1 tablet by mouth 4 (four) times daily., Disp: 360 tablet, Rfl: 1 ?   collagenase (SANTYL) ointment, Apply 1 application topically every other day., Disp: , Rfl:  ?  ferrous sulfate 325 (65 FE) MG tablet, Take 325 mg by mouth every Monday, Wednesday, and Friday., Disp: , Rfl:  ?  polyethylene glycol (MIRALAX / GLYCOLAX) 17 g packet, Take 17 g by mouth every other day., Disp: , Rfl:  ?  senna (SENOKOT) 8.6 MG TABS tablet, Take 1 tablet (8.6 mg total) by mouth daily., Disp: 120 tablet, Rfl: 0 ?  sulfamethoxazole-trimethoprim (BACTRIM) 400-80 MG tablet, Take 1 tablet by mouth 2 (two) times daily., Disp: 60 tablet, Rfl: 11 ?  torsemide (DEMADEX) 20 MG tablet, Take  20 mg by mouth daily. Sun, Mon, Wed, Fri, Sat, Disp: , Rfl:  ?  Torsemide 40 MG TABS, Take by mouth daily. Tues, Thrus, Disp: , Rfl:  ?  vitamin B-12 (CYANOCOBALAMIN) 1000 MCG tablet, Take 1,000 mcg by mouth daily., Disp: , Rfl:  ?  Vitamin D, Cholecalciferol, 25 MCG (1000 UT) TABS, Take 1,000 Units by mouth daily., Disp: , Rfl:  ?  zinc oxide 20 % ointment, Apply 1 application topically as needed for irritation. To buttocks after every incontinent episode and as needed for redness., Disp: , Rfl:  ? ? ?Review of Systems  ?Constitutional:  Negative for activity change, appetite change, chills, diaphoresis, fatigue, fever and unexpected weight change.  ?HENT:  Negative for congestion, rhinorrhea, sinus pressure, sneezing, sore throat and trouble swallowing.   ?Eyes:  Negative for photophobia and visual disturbance.  ?Respiratory:  Negative for cough, chest tightness, shortness of breath, wheezing and stridor.   ?Cardiovascular:  Negative for chest pain, palpitations and leg swelling.  ?Gastrointestinal:  Negative for abdominal distention, abdominal pain, anal bleeding, blood in stool, constipation, diarrhea, nausea and vomiting.  ?Genitourinary:  Negative for difficulty urinating, dysuria, flank pain and hematuria.  ?Musculoskeletal:  Negative for arthralgias, back pain, gait problem, joint swelling and myalgias.  ?Skin:  Positive  for wound. Negative for color change, pallor and rash.  ?Neurological:  Positive for tremors. Negative for dizziness, weakness and light-headedness.  ?Hematological:  Negative for adenopathy. Does not bruise

## 2021-06-20 NOTE — Patient Instructions (Signed)
She did not have curative below the knee amputation with Dr. Lajoyce Corners you will need to make another appointment with [place] but if you do should not be a reason to follow-up with me and thus a new infectious disease issue arises or you want me to retest for MRSA colonization ?

## 2021-06-21 LAB — CBC WITH DIFFERENTIAL/PLATELET
Absolute Monocytes: 795 cells/uL (ref 200–950)
Basophils Absolute: 28 cells/uL (ref 0–200)
Basophils Relative: 0.4 %
Eosinophils Absolute: 142 cells/uL (ref 15–500)
Eosinophils Relative: 2 %
HCT: 33.9 % — ABNORMAL LOW (ref 38.5–50.0)
Hemoglobin: 10.9 g/dL — ABNORMAL LOW (ref 13.2–17.1)
Lymphs Abs: 760 cells/uL — ABNORMAL LOW (ref 850–3900)
MCH: 29.3 pg (ref 27.0–33.0)
MCHC: 32.2 g/dL (ref 32.0–36.0)
MCV: 91.1 fL (ref 80.0–100.0)
MPV: 10.4 fL (ref 7.5–12.5)
Monocytes Relative: 11.2 %
Neutro Abs: 5375 cells/uL (ref 1500–7800)
Neutrophils Relative %: 75.7 %
Platelets: 234 10*3/uL (ref 140–400)
RBC: 3.72 10*6/uL — ABNORMAL LOW (ref 4.20–5.80)
RDW: 14.2 % (ref 11.0–15.0)
Total Lymphocyte: 10.7 %
WBC: 7.1 10*3/uL (ref 3.8–10.8)

## 2021-06-21 LAB — COMPLETE METABOLIC PANEL WITH GFR
AG Ratio: 1.3 (calc) (ref 1.0–2.5)
ALT: 6 U/L — ABNORMAL LOW (ref 9–46)
AST: 12 U/L (ref 10–35)
Albumin: 3.9 g/dL (ref 3.6–5.1)
Alkaline phosphatase (APISO): 54 U/L (ref 35–144)
BUN/Creatinine Ratio: 21 (calc) (ref 6–22)
BUN: 32 mg/dL — ABNORMAL HIGH (ref 7–25)
CO2: 27 mmol/L (ref 20–32)
Calcium: 9.1 mg/dL (ref 8.6–10.3)
Chloride: 99 mmol/L (ref 98–110)
Creat: 1.51 mg/dL — ABNORMAL HIGH (ref 0.70–1.22)
Globulin: 3 g/dL (calc) (ref 1.9–3.7)
Glucose, Bld: 121 mg/dL — ABNORMAL HIGH (ref 65–99)
Potassium: 4.6 mmol/L (ref 3.5–5.3)
Sodium: 136 mmol/L (ref 135–146)
Total Bilirubin: 0.6 mg/dL (ref 0.2–1.2)
Total Protein: 6.9 g/dL (ref 6.1–8.1)
eGFR: 44 mL/min/{1.73_m2} — ABNORMAL LOW (ref >=60)

## 2021-06-21 LAB — C-REACTIVE PROTEIN: CRP: 31.9 mg/L — ABNORMAL HIGH (ref <8.0)

## 2021-06-21 LAB — SEDIMENTATION RATE: Sed Rate: 55 mm/h — ABNORMAL HIGH (ref 0–20)

## 2021-06-25 ENCOUNTER — Encounter (HOSPITAL_BASED_OUTPATIENT_CLINIC_OR_DEPARTMENT_OTHER): Payer: Medicare Other | Admitting: General Surgery

## 2021-06-28 ENCOUNTER — Encounter (HOSPITAL_BASED_OUTPATIENT_CLINIC_OR_DEPARTMENT_OTHER): Payer: Medicare Other | Admitting: General Surgery

## 2021-06-28 ENCOUNTER — Other Ambulatory Visit: Payer: Self-pay

## 2021-06-28 DIAGNOSIS — L89620 Pressure ulcer of left heel, unstageable: Secondary | ICD-10-CM | POA: Diagnosis not present

## 2021-06-28 NOTE — Progress Notes (Signed)
Spina Mikel K. ([id number]) ?, ?Visit Report for 06/28/2021 ?Chief Complaint Document Details ?Patient Name: Date of Service: ?Greg [name], Greg GER K. 06/28/2021 9:30 A M ?Medical Record Number: [id number] ?Patient Account Number: [id number] ?Date of Birth/Sex: Treating RN: ?December 08, 1931 (88 y.o. Greg [name]) Dellie Catholic ?Primary Care Provider: Veleta Miners Other Clinician: ?Referring Provider: ?Treating Provider/Extender: Fredirick Maudlin ?Veleta Miners ?Weeks in Treatment: 25 ?Information Obtained from: Patient ?Chief Complaint ?01/04/2021; patient is here for review of a unstageable pressure ulcer on the left heel ?Electronic Signature(s) ?Signed: 06/28/2021 10:26:13 AM By: Fredirick Maudlin MD FACS ?Entered By: Fredirick Maudlin on 06/28/2021 10:26:12 ?-------------------------------------------------------------------------------- ?Debridement Details ?Patient Name: Date of Service: ?Greg [name], Greg GER K. 06/28/2021 9:30 A M ?Medical Record Number: [id number] ?Patient Account Number: [id number] ?Date of Birth/Sex: Treating RN: ?Dec 18, 1931 (88 y.o. Greg [name]) Dellie Catholic ?Primary Care Provider: Veleta Miners Other Clinician: ?Referring Provider: ?Treating Provider/Extender: Fredirick Maudlin ?Veleta Miners ?Weeks in Treatment: 25 ?Debridement Performed for Assessment: Wound #1 Left Calcaneus ?Performed By: Physician Fredirick Maudlin, MD ?Debridement Type: Debridement ?Level of Consciousness (Pre-procedure): Awake and Alert ?Pre-procedure Verification/Time Out Yes - 10:15 ?Taken: ?Start Time: 10:15 ?Pain Control: ?Other : Benzocaine 20% ?T Area Debrided (L x W): ?otal 6.5 (cm) x 5 (cm) = 32.5 (cm?) ?Tissue and other material debrided: Non-Viable, Fat, [place], [place], [place] ?Level: Skin/Subcutaneous Tissue ?Debridement Description: Excisional ?Instrument: Curette ?Bleeding: Moderate ?Hemostasis Achieved: Pressure ?End Time: 10:17 ?Procedural Pain: 0 ?Post Procedural Pain: 0 ?Response to Treatment: Procedure was tolerated well ?Level  of Consciousness (Post- Awake and Alert ?procedure): ?Post Debridement Measurements of Total Wound ?Length: (cm) 6.5 ?Stage: Unstageable/Unclassified ?Width: (cm) 5 ?Depth: (cm) 0.3 ?Volume: (cm?) 7.658 ?Character of Wound/Ulcer Post Debridement: Improved ?Post Procedure Diagnosis ?Same as Pre-procedure ?Electronic Signature(s) ?Signed: 06/28/2021 11:43:08 AM By: Fredirick Maudlin MD FACS ?Signed: 06/28/2021 4:55:44 PM By: Dellie Catholic RN ?Entered By: Dellie Catholic on 06/28/2021 10:22:23 ?-------------------------------------------------------------------------------- ?HPI Details ?Patient Name: Date of Service: ?Greg [name], Greg GER K. 06/28/2021 9:30 A M ?Medical Record Number: [id number] ?Patient Account Number: [id number] ?Date of Birth/Sex: Treating RN: ?1931-07-06 (88 y.o. Greg [name]) Dellie Catholic ?Primary Care Provider: Veleta Miners Other Clinician: ?Referring Provider: ?Treating Provider/Extender: Fredirick Maudlin ?Veleta Miners ?Weeks in Treatment: 25 ?History of Present Illness ?HPI Description: ADMISSION ?01/04/2021 ?Mr. Duvernay is a pleasant 88 year old woman who lives at [facility] in the skilled facility. He is accompanied by his wife. I did previously been notified ?by Dr. Lyndel Safe primary physician about this patient. He apparently fractured his right acetabulum in July. Sometime in the recovery of this developed a ?pressure ulcer on the right heel. When I was first contacted by Dr. Lyndel Safe this was described as a necrotic wound and indeed this is an accurate description. ?Unfortunately cultures of this have shown MRSA and after 1 negative x-ray a subsequent x-ray on 01/02/2021 showed changes of osteomyelitis. The patient ?was started on IV vancomycin at the facility and I believe is also on oral trimethoprim/sulfamethoxazole. They are using silver alginate which was done at my ?suggestion. Sedimentation rate and the said facility was 55 CRP P was also elevated at 73.7 compatible with his osteomyelitis ?Past  medical history includes a right acetabular fracture, DVT in the right femoral vein also in September on Eliquis, fairly severe Parkinson's disease. He has ?had [facility] in the skilled facility. ?ABI in our clinic was noncompressible on the left ?10/11; patient presents for follow-up. He has no issues or complaints today. He saw Dr. Tommy Medal on 10/7. He is currently on IV  vancomycin. He currently ?denies systemic signs of infection. ?10/18 remains on IV vancomycin. Santyl and silver alginate to the wound bed. [facility] the [facility] [place]. His wound has made ?some minor progress there is not as much necrotic tissue and some viable tissue on the surface however there is still an odor. ?The patient had noninvasive arterial tests done at my suggestion. On the left his ABI was noncompressible his TBI was 0.37 with monophasic and biphasic ?waveforms. Arterial Doppler showed monophasic waveforms at the ATA and PTA. However there was no focally hemodynamic significant stenosis identified. 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 Remains on IV vancomycin difficult to get consistent answers but it seems like they were not interested in the vascular surgery consult at this time. The ?wound itself somewhat improved in terms of the amount of necrotic material but still a very difficult necrotic wound. There is exposed bone. We are using ?Santyl and silver alginate ?11/15; he has completed her IV antibiotics and now is on trimethoprim sulfamethoxazole although I did not look over infectious disease notes. The wound is not ?really improved in size although the condition of the wound surface looks somewhat better there is still exposed bone. We have been using Santyl and silver ?alginate being [facility] at [facility] ?I think it is quite possible the patient has significant PAD however the patient and his wife were not interested in consideration of a vascular referral  therefore I ?have not made this ?12/6; 3-week follow-up at their request. The patient completed IV antibiotics and apparently is on Septra DS. He lives in the skilled area of [facility]. ?We tried to switch him to [facility] last time apparently that is not on their formulary and he was "unaffordable" per the information we got from the ?facility. Therefore still on Santyl and silver alginate. They are apparently rigorously offloading the heel when in bed. ?His wife had some questions about walking with physical therapy. As long as this is not putting undue pressure on the Achilles part of his heel where the wound ?is this should not be a problem ?12/28; Still on Septra regular strength twice a day. Apparently this has been extended by Dr. Drucilla Schmidt. We are using Santyl under [facility]. We were ?never able to get a copy of [facility] formulary. ?1/24; 1 month follow-up. Follows with Dr. Drucilla Schmidt in March. Still using Santyl and Hydrofera Blue. Although the wound surface does not look so bad there is ?extension of this wound superiorly up the back of his Achilles tendon. There is some very firm material which I think is probably calcification rather than bone. ?2/7; 2-week follow-up. Reviewed using Santyl and Hydrofera Blue for a prolonged period. His arterial studies show noncompressible ABIs however monophasic ?waveforms below the SFA and occluded posterior tibial arterie in the mid and distal aspect.. Peroneal artery in the mid aspect was not visualized. ?Both the patient and his wife previously had refused any suggestion of an aggressive approach to the vascular issue however I do not think we are going to ?heal this wound if the blood flow is obviously inadequate. I explained to him the neck step would be a consultation to a vascular interventional list for ?consideration of angiography and he wants to proceed with this as does his wife. ?The wound is stalled. We made considerable improvement  with the obvious infection when he first came in he has been treated for osteomyelitis and we have ?  managed to get necrotic tissue off the surface of this however not making any further improvement. ?2/21; 2 w

## 2021-06-28 NOTE — Progress Notes (Addendum)
Greg [name] ([id number]) , Visit Report for 06/28/2021 Arrival Information Details Patient Name: Date of Service: Greg [name] GER K. 06/28/2021 9:30 A M Medical Record Number: [id number] Patient Account Number: [id number] Date of Birth/Sex: Treating RN: Jan 09, 1932 (88 y.o. Greg [name] Primary Care Greg [name]: Veleta [name] Other Clinician: Referring Greg [name]: Treating Greg [name]/Extender: Greg [name] in Treatment: 25 Visit Information History Since Last Visit Added or deleted any medications: No Patient Arrived: Wheel Chair Any new allergies or adverse reactions: No Arrival Time: 09:53 Had a fall or experienced change in No Accompanied By: spouse activities of daily living that may affect Transfer Assistance: Manual risk of falls: Patient Identification Verified: Yes Signs or symptoms of abuse/neglect since last visito No Patient Requires Transmission-Based Precautions: No Hospitalized since last visit: No Patient Has Alerts: Yes Implantable device outside of the clinic excluding No Patient Alerts: Patient on Blood Thinner cellular tissue based products placed in the center L ABI non compressible since last visit: Pain Present Now: Yes Electronic Signature(s) Signed: 06/28/2021 4:55:44 PM By: Greg Catholic RN Entered By: Greg [name] on 06/28/2021 09:59:39 -------------------------------------------------------------------------------- Lower Extremity Assessment Details Patient Name: Date of Service: Greg [name], [name] K. 06/28/2021 9:30 A M Medical Record Number: [id number] Patient Account Number: [id number] Date of Birth/Sex: Treating RN: 1931-09-24 (88 y.o. Greg [name] Primary Care Dechelle [name]: Veleta [name] Other Clinician: Referring Breannah Kratt: Treating Greg [name]/Extender: Greg [name] in Treatment: 25 Edema Assessment Assessed: Greg [name]: No] Greg [name]: No] Edema: [Left: Ye] [Right: s] Calf Left: Right: Point  of Measurement: 36 cm From Medial Instep 45 cm Ankle Left: Right: Point of Measurement: 11 cm From Medial Instep 34 cm Electronic Signature(s) Signed: 06/28/2021 4:55:44 PM By: Greg Catholic RN Entered By: Greg [name] on 06/28/2021 10:05:32 -------------------------------------------------------------------------------- Multi Wound Chart Details Patient Name: Date of Service: Greg [name], [name] K. 06/28/2021 9:30 A M Medical Record Number: [id number] Patient Account Number: [id number] Date of Birth/Sex: Treating RN: April 20, 1931 (88 y.o. Greg [name] Primary Care Karanveer Ramakrishnan: Veleta [name] Other Clinician: Referring Layza Summa: Treating Greg [name]/Extender: Greg [name] in Treatment: 25 Vital Signs Height(in): 73 Pulse(bpm): 80 Weight(lbs): 180 Blood Pressure(mmHg): 117/66 Body Mass Index(BMI): 23.7 Temperature(F): 98.3 Respiratory Rate(breaths/min): 16 Photos: [N/A:N/A] Left Calcaneus N/A N/A Wound Location: Blister N/A N/A Wounding Event: Pressure Ulcer N/A N/A Primary Etiology: Hypertension, Osteoarthritis N/A N/A Comorbid History: 10/24/2020 N/A N/A Date Acquired: 25 N/A N/A [name] of Treatment: Open N/A N/A Wound Status: No N/A N/A Wound Recurrence: 6.5x5x0.3 N/A N/A Measurements L x W x D (cm) 25.525 N/A N/A A (cm) : rea 7.658 N/A N/A Volume (cm) : 45.80% N/A N/A % Reduction in A rea: -62.50% N/A N/A % Reduction in Volume: Unstageable/Unclassified N/A N/A Classification: Large N/A N/A Exudate A mount: Serosanguineous N/A N/A Exudate Type: red, brown N/A N/A Exudate Color: Distinct, outline attached N/A N/A Wound Margin: Large (67-100%) N/A N/A Granulation A mount: Pink, Friable N/A N/A Granulation Quality: Small (1-33%) N/A N/A Necrotic A mount: Fat Layer (Subcutaneous Tissue): Yes N/A N/A Exposed Structures: Fascia: No Tendon: No Muscle: No Joint: No Bone: No Small (1-33%) N/A  N/A Epithelialization: Debridement - Excisional N/A N/A Debridement: Pre-procedure Verification/Time Out 10:15 N/A N/A Taken: Other N/A N/A Pain Control: Fat, Slough N/A N/A Tissue Debrided: Skin/Subcutaneous Tissue N/A N/A Level: 32.5 N/A N/A Debridement A (sq cm): rea Curette N/A N/A Instrument: Moderate N/A N/A Bleeding: Pressure N/A N/A Hemostasis A chieved: 0 N/A N/A Procedural Pain: 0 N/A N/A Post Procedural Pain:  Procedure was tolerated well N/A N/A Debridement Treatment Response: 6.5x5x0.3 N/A N/A Post Debridement Measurements L x W x D (cm) 7.658 N/A N/A Post Debridement Volume: (cm) Unstageable/Unclassified N/A N/A Post Debridement Stage: Debridement N/A N/A Procedures Performed: Treatment Notes Electronic Signature(s) Signed: 06/28/2021 10:25:56 AM By: Greg Maudlin MD FACS Signed: 06/28/2021 4:55:44 PM By: Greg Catholic RN Entered By: Greg [name] on 06/28/2021 10:25:55 -------------------------------------------------------------------------------- Multi-Disciplinary Care Plan Details Patient Name: Date of Service: Greg [name], [name] K. 06/28/2021 9:30 A M Medical Record Number: [id number] Patient Account Number: [id number] Date of Birth/Sex: Treating RN: May 06, 1931 (88 y.o. Greg [name] Primary Care Greg [name]: Veleta [name] Other Clinician: Referring Greg [name]: Treating Greg [name]/Extender: Greg [name] in Treatment: Greg [name] reviewed with physician Active Inactive Electronic Signature(s) Signed: 11/12/2021 6:15:53 PM By: Greg Gouty RN, BSN Entered By: Greg [name] on 07/29/2021 16:56:48 -------------------------------------------------------------------------------- Pain Assessment Details Patient Name: Date of Service: Greg [name], [name] K. 06/28/2021 9:30 A M Medical Record Number: [id number] Patient Account Number: [id number] Date of Birth/Sex: Treating RN: April 21, 1931 (88 y.o. Greg [name] Primary Care Greg [name]: Veleta [name] Other Clinician: Referring Greg [name]: Treating Greg [name]/Extender: Greg [name] in Treatment: 25 Active Problems Location of Pain Severity and Description of Pain Patient Has Paino Yes Site Locations Pain Location: Pain in Ulcers With Dressing Change: Yes Duration of the Pain. Constant / Intermittento Constant Rate the pain. Current Pain Level: 7 Worst Pain Level: 10 Least Pain Level: 3 Tolerable Pain Level: 7 Character of Pain Describe the Pain: Difficult to Pinpoint Pain Management and Medication Current Pain Management: Medication: Yes Cold Application: No Rest: Yes Massage: No Activity: No T.E.N.S.: No Heat Application: No Leg drop or elevation: No Is the Current Pain Management Adequate: Adequate How does your wound impact your activities of daily livingo Sleep: No Bathing: No Appetite: No Relationship With Others: No Bladder Continence: No Emotions: No Bowel Continence: No Work: No Toileting: No Drive: No Dressing: No Hobbies: No Electronic Signature(s) Signed: 06/28/2021 4:55:44 PM By: Greg Catholic RN Entered By: Greg [name] on 06/28/2021 10:01:47 -------------------------------------------------------------------------------- Wound Assessment Details Patient Name: Date of Service: Greg [name], [name] K. 06/28/2021 9:30 A M Medical Record Number: [id number] Patient Account Number: [id number] Date of Birth/Sex: Treating RN: 03-24-1932 (88 y.o. Greg [name] Primary Care Shariah Assad: Veleta [name] Other Clinician: Referring Shama Monfils: Treating Therin Vetsch/Extender: Greg [name] in Treatment: 25 Wound Status Wound Number: 1 Primary Etiology: Pressure Ulcer Wound Location: Left Calcaneus Wound Status: Open Wounding Event: Blister Comorbid History: Hypertension, Osteoarthritis Date Acquired: 10/24/2020 [name] Of Treatment: 25 Clustered Wound:  No Photos Wound Measurements Length: (cm) 6.5 Width: (cm) 5 Depth: (cm) 0.3 Area: (cm) 25.525 Volume: (cm) 7.658 % Reduction in Area: 45.8% % Reduction in Volume: -62.5% Epithelialization: Small (1-33%) Tunneling: No Undermining: No Wound Description Classification: Unstageable/Unclassified Wound Margin: Distinct, outline attached Exudate Amount: Large Exudate Type: Serosanguineous Exudate Color: red, brown Foul Odor After Cleansing: No Slough/Fibrino Yes Wound Bed Granulation Amount: Large (67-100%) Exposed Structure Granulation Quality: Pink, Friable Fascia Exposed: No Necrotic Amount: Small (1-33%) Fat Layer (Subcutaneous Tissue) Exposed: Yes Necrotic Quality: Adherent Slough Tendon Exposed: No Muscle Exposed: No Joint Exposed: No Bone Exposed: No Electronic Signature(s) Signed: 06/28/2021 4:55:44 PM By: Greg Catholic RN Entered By: Greg [name] on 06/28/2021 10:06:51 -------------------------------------------------------------------------------- Vitals Details Patient Name: Date of Service: Greg [name], [name] K. 06/28/2021 9:30 A M Medical Record Number: [id number] Patient Account Number: [id number] Date of Birth/Sex: Treating RN: 06/18/1931 (88 y.o.  Greg [name] Primary Care Kindrick Lankford: Veleta [name] Other Clinician: Referring Manolito Jurewicz: Treating Davari Lopes/Extender: Greg [name] in Treatment: 25 Vital Signs Time Taken: 09:59 Temperature (F): 98.3 Height (in): 73 Pulse (bpm): 80 Weight (lbs): 180 Respiratory Rate (breaths/min): 16 Body Mass Index (BMI): 23.7 Blood Pressure (mmHg): 117/66 Reference Range: 80 - 120 mg / dl Electronic Signature(s) Signed: 06/28/2021 4:55:44 PM By: Greg Catholic RN Entered By: Greg [name] on 06/28/2021 10:00:11

## 2021-07-01 ENCOUNTER — Ambulatory Visit (INDEPENDENT_AMBULATORY_CARE_PROVIDER_SITE_OTHER): Payer: Medicare Other | Admitting: Orthopedic Surgery

## 2021-07-01 ENCOUNTER — Encounter: Payer: Self-pay | Admitting: Orthopedic Surgery

## 2021-07-01 ENCOUNTER — Ambulatory Visit (INDEPENDENT_AMBULATORY_CARE_PROVIDER_SITE_OTHER): Payer: Medicare Other

## 2021-07-01 DIAGNOSIS — M86272 Subacute osteomyelitis, left ankle and foot: Secondary | ICD-10-CM

## 2021-07-01 DIAGNOSIS — M79672 Pain in left foot: Secondary | ICD-10-CM

## 2021-07-01 NOTE — Progress Notes (Signed)
? ?Office Visit Note ?  ?Patient: Greg [name]           ?Date of Birth: Feb 19, 1932           ?MRN: [id number] ?Visit Date: 07/01/2021 ?             ?Requested by: Tommy Medal, Lavell Islam, MD ?[address] ?[place],  [place] [address] ?PCP: Virgie Dad, MD ? ?Chief Complaint  ?Patient presents with  ? Left Foot - Wound Check  ?  Acute osteomyelitis of calcaneus  ? ? ? ? ?HPI: ?Patient is an 88 year old gentleman who is seen for initial evaluation and consultation with infectious disease.  Patient has been undergoing prolonged conservative therapy with antibiotics and wound care for the chronic osteomyelitis of the left calcaneus.  Wound cultures have been positive for MRSA. ? ?Assessment & Plan: ?Visit Diagnoses:  ?1. Pain in left foot   ?2. Subacute osteomyelitis, left ankle and foot ([place])   ? ? ?Plan: Recommended proceeding with a transtibial amputation on the left.  Anticipate [facility] nursing level approximately 3 to 5 days after surgery.  Patient and his wife wish to proceed with surgery this week. ? ?Follow-Up Instructions: No follow-ups on file.  ? ?Ortho Exam ? ?Patient is alert, oriented, no adenopathy, well-dressed, normal affect, normal respiratory effort. ?On examination the Doppler was used and patient has a strong dopplerable biphasic dorsalis pedis and posterior tibial pulse he has a large chronic ulcer over the left calcaneus patient has cellulitis of the foot and ankle as well as swelling.  Patient has multiple medical problems including peripheral vascular disease Parkinson's disease stage IIIb kidney disease.  Sed rate is 55 and a CRP is 31.9.  Hemoglobin is 10.9 with an albumin of 2.9. ? ?Imaging: ?XR Foot 2 Views Left ? ?Result Date: 07/01/2021 ?2 view radiographs of the left foot shows extensive soft tissue defect over the calcaneus posteriorly as well as severe peripheral vascular disease with calcification of the arteries down through the foot.   ?No images are attached to the encounter. ? ?Labs: ?Lab Results  ?Component Value Date  ? HGBA1C 5.8 05/23/2021  ? HGBA1C 5.9 (H) 10/08/2020  ? ESRSEDRATE 55 (H) 06/20/2021  ? ESRSEDRATE 75 (H) 03/22/2021  ? ESRSEDRATE 48 02/14/2021  ? CRP 31.9 (H) 06/20/2021  ? CRP 151.2 (H) 03/22/2021  ? CRP 16.8 01/15/2021  ? ? ? ?Lab Results  ?Component Value Date  ? ALBUMIN 2.9 (A) 12/27/2020  ? ALBUMIN 3.7 12/26/2020  ? ALBUMIN 2.6 (L) 10/12/2020  ? ? ?Lab Results  ?Component Value Date  ? MG 2.1 10/10/2020  ? ?Lab Results  ?Component Value Date  ? VD25OH 30.55 10/09/2020  ? ? ?No results found for: PREALBUMIN ? ?  Latest Ref Rng & Units 06/20/2021  ? 11:20 AM 06/04/2021  ?  9:57 AM 04/18/2021  ? 12:00 AM  ?CBC EXTENDED  ?WBC 3.8 - 10.8 Thousand/uL 7.1   5.7   7.1       ?RBC 4.20 - 5.80 Million/uL 3.72   3.78   3.59       ?Hemoglobin 13.2 - 17.1 g/dL 10.9   11.1   10.1       ?HCT 38.5 - 50.0 % 33.9   32.8   32       ?Platelets 140 - 400 Thousand/uL 234   236   291       ?NEUT# 1,500 - 7,800  cells/uL 5,375    4,665.00       ?Lymph# 850 - 3,900 cells/uL 760      ?  ? This result is from an external source.  ? ? ? ?There is no height or weight on file to calculate BMI. ? ?Orders:  ?Orders Placed This Encounter  ?Procedures  ? XR Foot 2 Views Left  ? ?No orders of the defined types were placed in this encounter. ? ? ? Procedures: ?No procedures performed ? ?Clinical Data: ?No additional findings. ? ?ROS: ? ?All other systems negative, except as noted in the HPI. ?Review of Systems ? ?Objective: ?Vital Signs: There were no vitals taken for this visit. ? ?Specialty Comments:  ?No specialty comments available. ? ?PMFS History: ?Patient Active Problem List  ? Diagnosis Date Noted  ? Vitamin B12 deficiency 03/05/2021  ? Candida rash of groin 03/05/2021  ? (HFpEF) heart failure with preserved ejection fraction ([place]) 02/04/2021  ? Acute osteomyelitis of left calcaneus ([place]) 01/11/2021  ? MRSA infection 01/11/2021  ? CKD (chronic kidney  disease) stage 3, GFR 30-59 ml/min ([facility]) 01/08/2021  ? DVT of deep femoral vein, right ([place]) 12/22/2020  ? Pressure ulcer, unstageable ([place]) 11/12/2020  ? Hyponatremia 10/15/2020  ? Prediabetes 10/15/2020  ? HTN (hypertension) 10/15/2020  ? Slow transit constipation 10/15/2020  ? Blood loss anemia 10/15/2020  ? Closed displaced fracture of right acetabulum ([place])   ? Closed right hip fracture ([place]) 10/08/2020  ? Parkinson's disease ([place]) 09/25/2020  ? BPH (benign prostatic hyperplasia) 09/25/2020  ? Edema 09/25/2020  ? Osteoarthritis, multiple sites 09/25/2020  ? Osteoporosis 09/25/2020  ? Hyperlipidemia 09/25/2020  ? ?Past Medical History:  ?Diagnosis Date  ? Acute bronchiolitis   ? Acute osteomyelitis of calcaneum, left ([place]) 01/11/2021  ? Anemia   ? Bursitis of both hips   ? Hearing reduced   ? hearing aid  ? History of basal cell carcinoma   ? Hyperlipidemia   ? Malaise and fatigue   ? MRSA infection 01/11/2021  ? Osteoarthritis   ? hand  ? Osteoporosis   ? Palpitation   ? Vitamin D deficiency   ?  ?Family History  ?Problem Relation Age of Onset  ? Prostate cancer Father   ? Skin cancer Brother   ?  ?Past Surgical History:  ?Procedure Laterality Date  ? ABDOMINAL AORTOGRAM W/LOWER EXTREMITY N/A 06/12/2021  ? Procedure: ABDOMINAL AORTOGRAM W/LOWER EXTREMITY;  Surgeon: Wellington Hampshire, MD;  Location: [facility];  Service: Cardiovascular;  Laterality: N/A;  ? CATARACT EXTRACTION  05/31/2015  ? Eye/right  ? COLONOSCOPY  02/05/2005, 03/06/2005  ? normal, 10 years ago  ? MOHS micrographic surgery    ? Face surgery  ? ORIF ACETABULAR FRACTURE Right 10/09/2020  ? Procedure: OPEN REDUCTION INTERNAL FIXATION (ORIF) ACETABULAR FRACTURE;  Surgeon: [place], MD;  Location: [facility];  Service: Orthopedics;  Laterality: Right;  ? ?Social History  ? ?Occupational History  ? Occupation: retired  ?  Comment: art professor  ?Tobacco Use  ? Smoking status: Never  ? Smokeless tobacco: Never  ?Vaping Use  ? Vaping Use: Never  used  ?Substance and Sexual Activity  ? Alcohol use: Never  ? Drug use: Never  ? Sexual activity: Not on file  ? ? ? ? ? ?

## 2021-07-02 ENCOUNTER — Other Ambulatory Visit: Payer: Self-pay

## 2021-07-02 ENCOUNTER — Encounter (HOSPITAL_COMMUNITY): Payer: Self-pay | Admitting: Orthopedic Surgery

## 2021-07-02 NOTE — Pre-Procedure Instructions (Signed)
? ?   Greg [name] ? 07/02/2021  ? ? [name]'s procedure is scheduled on tomorrow at 10:30 AM. ? ? Report to [facility] Entrance "A" Admitting Office at 8:00 AM. ? ? Call this number if you have problems the morning of surgery: [phone] ? ? Remember: ? Do not eat food after midnight tonight. ? You may drink clear liquids until 7:30 AM.  Clear liquids allowed are: Water, Juice (non-citric and without pulp - diabetics please choose diet or no sugar options), Carbonated beverages - (diabetics please choose diet or no sugar options), Clear Tea, Black Coffee only (no creamer, milk or cream including half and half), and Gatorade (diabetics please choose diet or no sugar options) ?  ? Take these medicines the morning of surgery  while drinking clear liquids:  Acetaminophen (Tylenol), Apixaban (Eliquis), Carbidopa-Levodopa, Senna (Senakot), Albuterol inhaler - if needed (have pt bring inhaler with him in the AM) ?  ? Do not wear jewelry. ? Do not wear lotions, powders, cologne or deodorant. ? Men may shave face and neck. ? Do not bring valuables to the hospital. ? [facility] is not responsible for any belongings or valuables. ? ?Contacts, dentures or bridgework may not be worn into surgery.  Leave your suitcase in the car.  After surgery it may be brought to your room. ? ?For patients admitted to the hospital, discharge time will be determined by your treatment team. ? ?Please have patient shower in the AM using an antibacterial soap (Dial). Have use a clean washcloth and have a clean towel to dry off with. Make sure he does not put any lotions, powders, cologne or deodorant on after showering. We ask that he wears clean clothes, something easy to take off and easy to put back on when he is discharged.  ? ?Please have patient brush his teeth prior to coming to the hospital. ? ?Any questions today, please call me, Hyman Bible, RN at [phone]. I am here until 7:30 PM. ? ? ?  ?

## 2021-07-02 NOTE — Progress Notes (Signed)
Pt is a resident at [facility]. I have spoken with Frederich Balding, Director of patient development. She states that pt's wife wanted her to call us and get instructions about his surgery. She states she is trying to help them out. I have faxed the pre-op instructions to Almyra Free at [phone]. I have spoke with pt's wife and have gone over pt's medical history. I did give her the instructions also but told her that Almyra Free would the written instructions. Pt does not have a history of HTN or heart disease. Pt is pre-Diabetic. His last A1C was 5,8 on 05/23/21. Pt does have Parkinson's disease, PVD. Pt is to continue Eliquis, Dr. Sharol Given does not hold Eliquis prior to surgery. Pt will be arriving via [facility] transportation. Wife will also be coming.  ? ?Showers instructions given to Mrs. Crismon. ?

## 2021-07-03 ENCOUNTER — Inpatient Hospital Stay (HOSPITAL_COMMUNITY): Payer: Medicare Other | Admitting: Certified Registered Nurse Anesthetist

## 2021-07-03 ENCOUNTER — Encounter (HOSPITAL_COMMUNITY): Payer: Self-pay | Admitting: Orthopedic Surgery

## 2021-07-03 ENCOUNTER — Inpatient Hospital Stay (HOSPITAL_COMMUNITY)
Admission: RE | Admit: 2021-07-03 | Discharge: 2021-07-05 | DRG: 475 | Disposition: A | Discharge status: Skilled Nursing Facility | Payer: Medicare Other | Source: Ambulatory Visit | Attending: Orthopedic Surgery | Admitting: Orthopedic Surgery

## 2021-07-03 ENCOUNTER — Other Ambulatory Visit: Payer: Self-pay

## 2021-07-03 ENCOUNTER — Encounter (HOSPITAL_COMMUNITY)
Admission: RE | Disposition: A | Discharge status: Skilled Nursing Facility | Payer: Self-pay | Source: Ambulatory Visit | Attending: Orthopedic Surgery

## 2021-07-03 DIAGNOSIS — M869 Osteomyelitis, unspecified: Secondary | ICD-10-CM | POA: Diagnosis not present

## 2021-07-03 DIAGNOSIS — E785 Hyperlipidemia, unspecified: Secondary | ICD-10-CM | POA: Diagnosis present

## 2021-07-03 DIAGNOSIS — M86672 Other chronic osteomyelitis, left ankle and foot: Secondary | ICD-10-CM | POA: Diagnosis present

## 2021-07-03 DIAGNOSIS — I10 Essential (primary) hypertension: Secondary | ICD-10-CM | POA: Diagnosis not present

## 2021-07-03 DIAGNOSIS — S88112A Complete traumatic amputation at level between knee and ankle, left lower leg, initial encounter: Secondary | ICD-10-CM

## 2021-07-03 DIAGNOSIS — I70262 Atherosclerosis of native arteries of extremities with gangrene, left leg: Secondary | ICD-10-CM | POA: Diagnosis present

## 2021-07-03 DIAGNOSIS — D649 Anemia, unspecified: Secondary | ICD-10-CM | POA: Diagnosis not present

## 2021-07-03 DIAGNOSIS — E559 Vitamin D deficiency, unspecified: Secondary | ICD-10-CM | POA: Diagnosis present

## 2021-07-03 DIAGNOSIS — G20 Parkinson's disease: Secondary | ICD-10-CM | POA: Diagnosis present

## 2021-07-03 DIAGNOSIS — R739 Hyperglycemia, unspecified: Secondary | ICD-10-CM

## 2021-07-03 DIAGNOSIS — Z8042 Family history of malignant neoplasm of prostate: Secondary | ICD-10-CM

## 2021-07-03 DIAGNOSIS — L97429 Non-pressure chronic ulcer of left heel and midfoot with unspecified severity: Secondary | ICD-10-CM | POA: Diagnosis present

## 2021-07-03 DIAGNOSIS — M86172 Other acute osteomyelitis, left ankle and foot: Secondary | ICD-10-CM | POA: Diagnosis not present

## 2021-07-03 DIAGNOSIS — M80059D Age-related osteoporosis with current pathological fracture, unspecified femur, subsequent encounter for fracture with routine healing: Secondary | ICD-10-CM

## 2021-07-03 DIAGNOSIS — Z808 Family history of malignant neoplasm of other organs or systems: Secondary | ICD-10-CM

## 2021-07-03 DIAGNOSIS — Z85828 Personal history of other malignant neoplasm of skin: Secondary | ICD-10-CM | POA: Diagnosis not present

## 2021-07-03 DIAGNOSIS — F0280 Dementia in other diseases classified elsewhere without behavioral disturbance: Secondary | ICD-10-CM | POA: Diagnosis present

## 2021-07-03 DIAGNOSIS — M810 Age-related osteoporosis without current pathological fracture: Secondary | ICD-10-CM

## 2021-07-03 DIAGNOSIS — N289 Disorder of kidney and ureter, unspecified: Secondary | ICD-10-CM | POA: Diagnosis not present

## 2021-07-03 DIAGNOSIS — R7303 Prediabetes: Secondary | ICD-10-CM | POA: Diagnosis present

## 2021-07-03 DIAGNOSIS — Z9841 Cataract extraction status, right eye: Secondary | ICD-10-CM | POA: Diagnosis not present

## 2021-07-03 HISTORY — DX: Prediabetes: R73.03

## 2021-07-03 HISTORY — DX: Parkinson's disease without dyskinesia, without mention of fluctuations: G20.A1

## 2021-07-03 HISTORY — DX: Parkinson's disease: G20

## 2021-07-03 HISTORY — DX: Unspecified dementia, unspecified severity, without behavioral disturbance, psychotic disturbance, mood disturbance, and anxiety: F03.90

## 2021-07-03 HISTORY — DX: Other complications of anesthesia, initial encounter: T88.59XA

## 2021-07-03 HISTORY — PX: AMPUTATION: SHX166

## 2021-07-03 HISTORY — DX: Peripheral vascular disease, unspecified: I73.9

## 2021-07-03 LAB — CBC WITH DIFFERENTIAL/PLATELET
Abs Immature Granulocytes: 0.03 10*3/uL (ref 0.00–0.07)
Basophils Absolute: 0 10*3/uL (ref 0.0–0.1)
Basophils Relative: 0 %
Eosinophils Absolute: 0 10*3/uL (ref 0.0–0.5)
Eosinophils Relative: 0 %
HCT: 29.6 % — ABNORMAL LOW (ref 39.0–52.0)
Hemoglobin: 9.5 g/dL — ABNORMAL LOW (ref 13.0–17.0)
Immature Granulocytes: 0 %
Lymphocytes Relative: 8 %
Lymphs Abs: 0.6 10*3/uL — ABNORMAL LOW (ref 0.7–4.0)
MCH: 29.5 pg (ref 26.0–34.0)
MCHC: 32.1 g/dL (ref 30.0–36.0)
MCV: 91.9 fL (ref 80.0–100.0)
Monocytes Absolute: 0.2 10*3/uL (ref 0.1–1.0)
Monocytes Relative: 3 %
Neutro Abs: 6.6 10*3/uL (ref 1.7–7.7)
Neutrophils Relative %: 89 %
Platelets: 207 10*3/uL (ref 150–400)
RBC: 3.22 MIL/uL — ABNORMAL LOW (ref 4.22–5.81)
RDW: 15.8 % — ABNORMAL HIGH (ref 11.5–15.5)
WBC: 7.4 10*3/uL (ref 4.0–10.5)
nRBC: 0 % (ref 0.0–0.2)

## 2021-07-03 LAB — COMPREHENSIVE METABOLIC PANEL
ALT: 6 U/L (ref 0–44)
AST: 14 U/L — ABNORMAL LOW (ref 15–41)
Albumin: 3.4 g/dL — ABNORMAL LOW (ref 3.5–5.0)
Alkaline Phosphatase: 48 U/L (ref 38–126)
Anion gap: 8 (ref 5–15)
BUN: 34 mg/dL — ABNORMAL HIGH (ref 8–23)
CO2: 26 mmol/L (ref 22–32)
Calcium: 9 mg/dL (ref 8.9–10.3)
Chloride: 102 mmol/L (ref 98–111)
Creatinine, Ser: 1.49 mg/dL — ABNORMAL HIGH (ref 0.61–1.24)
GFR, Estimated: 45 mL/min — ABNORMAL LOW (ref >60)
Glucose, Bld: 96 mg/dL (ref 70–99)
Potassium: 4.4 mmol/L (ref 3.5–5.1)
Sodium: 136 mmol/L (ref 135–145)
Total Bilirubin: 0.5 mg/dL (ref 0.3–1.2)
Total Protein: 6.9 g/dL (ref 6.5–8.1)

## 2021-07-03 LAB — PREALBUMIN: Prealbumin: 18.5 mg/dL (ref 18–38)

## 2021-07-03 LAB — HEMOGLOBIN A1C
Hgb A1c MFr Bld: 5.8 % — ABNORMAL HIGH (ref 4.8–5.6)
Mean Plasma Glucose: 119.76 mg/dL

## 2021-07-03 LAB — VITAMIN D 25 HYDROXY (VIT D DEFICIENCY, FRACTURES): Vit D, 25-Hydroxy: 28.44 ng/mL — ABNORMAL LOW (ref 30–100)

## 2021-07-03 LAB — MAGNESIUM: Magnesium: 2.2 mg/dL (ref 1.7–2.4)

## 2021-07-03 SURGERY — AMPUTATION BELOW KNEE
Anesthesia: Regional | Site: Knee | Laterality: Left

## 2021-07-03 MED ORDER — CLONIDINE HCL (ANALGESIA) 100 MCG/ML EP SOLN
EPIDURAL | Status: DC | PRN
  Administered 2021-07-03: 50 ug
  Administered 2021-07-03: 30 ug

## 2021-07-03 MED ORDER — 0.9 % SODIUM CHLORIDE (POUR BTL) OPTIME
TOPICAL | Status: DC | PRN
  Administered 2021-07-03: 1000 mL

## 2021-07-03 MED ORDER — ASCORBIC ACID 500 MG PO TABS
1000.0000 mg | ORAL_TABLET | Freq: Every day | ORAL | Status: DC
  Administered 2021-07-03 – 2021-07-05 (×3): 1000 mg via ORAL
  Filled 2021-07-03 (×3): qty 2

## 2021-07-03 MED ORDER — LABETALOL HCL 5 MG/ML IV SOLN: 10.0000 mg | INTRAVENOUS | Status: DC | PRN

## 2021-07-03 MED ORDER — BISACODYL 5 MG PO TBEC: 5.0000 mg | DELAYED_RELEASE_TABLET | Freq: Every day | ORAL | Status: DC | PRN

## 2021-07-03 MED ORDER — HYDROMORPHONE HCL 1 MG/ML IJ SOLN: 0.5000 mg | INTRAMUSCULAR | Status: DC | PRN

## 2021-07-03 MED ORDER — ACETAMINOPHEN 325 MG PO TABS: 325.0000 mg | ORAL_TABLET | Freq: Four times a day (QID) | ORAL | Status: DC | PRN

## 2021-07-03 MED ORDER — CARBIDOPA-LEVODOPA ER 50-200 MG PO TBCR
1.0000 | EXTENDED_RELEASE_TABLET | Freq: Every day | ORAL | Status: DC
  Administered 2021-07-03 – 2021-07-04 (×2): 1 via ORAL
  Filled 2021-07-03 (×3): qty 1

## 2021-07-03 MED ORDER — ALUM & MAG HYDROXIDE-SIMETH 200-200-20 MG/5ML PO SUSP: 15.0000 mL | ORAL | Status: DC | PRN

## 2021-07-03 MED ORDER — PROPOFOL 1000 MG/100ML IV EMUL
INTRAVENOUS | Status: AC
  Filled 2021-07-03: qty 100

## 2021-07-03 MED ORDER — LACTATED RINGERS IV SOLN: INTRAVENOUS | Status: DC

## 2021-07-03 MED ORDER — PHENOL 1.4 % MT LIQD: 1.0000 | OROMUCOSAL | Status: DC | PRN

## 2021-07-03 MED ORDER — FLEET ENEMA 7-19 GM/118ML RE ENEM: 1.0000 | ENEMA | Freq: Once | RECTAL | Status: DC | PRN

## 2021-07-03 MED ORDER — CEFAZOLIN SODIUM-DEXTROSE 2-4 GM/100ML-% IV SOLN
2.0000 g | INTRAVENOUS | Status: AC
  Administered 2021-07-03: 2 g via INTRAVENOUS
  Filled 2021-07-03: qty 100

## 2021-07-03 MED ORDER — DEXAMETHASONE SODIUM PHOSPHATE 4 MG/ML IJ SOLN
INTRAMUSCULAR | Status: DC | PRN
  Administered 2021-07-03: 2 mg via INTRAVENOUS
  Administered 2021-07-03: 3 mg via INTRAVENOUS

## 2021-07-03 MED ORDER — SODIUM CHLORIDE 0.9 % IV SOLN: INTRAVENOUS | Status: DC

## 2021-07-03 MED ORDER — TORSEMIDE 20 MG PO TABS
20.0000 mg | ORAL_TABLET | ORAL | Status: DC
  Administered 2021-07-05: 20 mg via ORAL
  Filled 2021-07-03: qty 1

## 2021-07-03 MED ORDER — JUVEN PO PACK
1.0000 | PACK | Freq: Two times a day (BID) | ORAL | Status: DC
  Administered 2021-07-04 – 2021-07-05 (×3): 1 via ORAL
  Filled 2021-07-03 (×3): qty 1

## 2021-07-03 MED ORDER — ORAL CARE MOUTH RINSE: 15.0000 mL | Freq: Once | OROMUCOSAL | Status: AC

## 2021-07-03 MED ORDER — ALBUTEROL SULFATE (2.5 MG/3ML) 0.083% IN NEBU: 3.0000 mL | INHALATION_SOLUTION | RESPIRATORY_TRACT | Status: DC | PRN

## 2021-07-03 MED ORDER — MAGNESIUM SULFATE 2 GM/50ML IV SOLN: 2.0000 g | Freq: Every day | INTRAVENOUS | Status: DC | PRN

## 2021-07-03 MED ORDER — CHLORHEXIDINE GLUCONATE 0.12 % MT SOLN
15.0000 mL | Freq: Once | OROMUCOSAL | Status: AC
  Administered 2021-07-03: 15 mL via OROMUCOSAL
  Filled 2021-07-03: qty 15

## 2021-07-03 MED ORDER — FENTANYL CITRATE (PF) 250 MCG/5ML IJ SOLN
INTRAMUSCULAR | Status: AC
  Filled 2021-07-03: qty 5

## 2021-07-03 MED ORDER — ZINC SULFATE 220 (50 ZN) MG PO CAPS
220.0000 mg | ORAL_CAPSULE | Freq: Every day | ORAL | Status: DC
Start: 2021-07-03 — End: 2021-07-05
  Administered 2021-07-03 – 2021-07-05 (×3): 220 mg via ORAL
  Filled 2021-07-03 (×3): qty 1

## 2021-07-03 MED ORDER — OXYCODONE HCL 5 MG PO TABS
10.0000 mg | ORAL_TABLET | ORAL | Status: DC | PRN
  Administered 2021-07-04: 15 mg via ORAL
  Filled 2021-07-03: qty 3
  Filled 2021-07-03: qty 2

## 2021-07-03 MED ORDER — FENTANYL CITRATE PF 50 MCG/ML IJ SOSY: 50.0000 ug | PREFILLED_SYRINGE | Freq: Once | INTRAMUSCULAR | Status: DC

## 2021-07-03 MED ORDER — TORSEMIDE 20 MG PO TABS
40.0000 mg | ORAL_TABLET | ORAL | Status: DC
  Administered 2021-07-04: 40 mg via ORAL
  Filled 2021-07-03: qty 2

## 2021-07-03 MED ORDER — HYDRALAZINE HCL 20 MG/ML IJ SOLN: 5.0000 mg | INTRAMUSCULAR | Status: DC | PRN

## 2021-07-03 MED ORDER — ALBUMIN HUMAN 5 % IV SOLN: INTRAVENOUS | Status: DC | PRN

## 2021-07-03 MED ORDER — PROPOFOL 500 MG/50ML IV EMUL
INTRAVENOUS | Status: DC | PRN
  Administered 2021-07-03: 75 ug/kg/min via INTRAVENOUS

## 2021-07-03 MED ORDER — FENTANYL CITRATE (PF) 100 MCG/2ML IJ SOLN: 25.0000 ug | INTRAMUSCULAR | Status: DC | PRN

## 2021-07-03 MED ORDER — POLYETHYLENE GLYCOL 3350 17 G PO PACK: 17.0000 g | PACK | Freq: Every day | ORAL | Status: DC | PRN

## 2021-07-03 MED ORDER — PROPOFOL 10 MG/ML IV BOLUS
INTRAVENOUS | Status: DC | PRN
  Administered 2021-07-03 (×2): 10 mg via INTRAVENOUS

## 2021-07-03 MED ORDER — OXYCODONE HCL 5 MG PO TABS
5.0000 mg | ORAL_TABLET | ORAL | Status: DC | PRN
  Administered 2021-07-03 – 2021-07-04 (×2): 10 mg via ORAL
  Filled 2021-07-03: qty 2

## 2021-07-03 MED ORDER — PHENYLEPHRINE 40 MCG/ML (10ML) SYRINGE FOR IV PUSH (FOR BLOOD PRESSURE SUPPORT)
PREFILLED_SYRINGE | INTRAVENOUS | Status: DC | PRN
  Administered 2021-07-03 (×2): 120 ug via INTRAVENOUS

## 2021-07-03 MED ORDER — CEFAZOLIN SODIUM-DEXTROSE 2-4 GM/100ML-% IV SOLN
2.0000 g | Freq: Three times a day (TID) | INTRAVENOUS | Status: AC
  Administered 2021-07-03 (×2): 2 g via INTRAVENOUS
  Filled 2021-07-03 (×2): qty 100

## 2021-07-03 MED ORDER — APIXABAN 5 MG PO TABS
5.0000 mg | ORAL_TABLET | Freq: Two times a day (BID) | ORAL | Status: DC
  Administered 2021-07-03 – 2021-07-05 (×4): 5 mg via ORAL
  Filled 2021-07-03 (×4): qty 1

## 2021-07-03 MED ORDER — CARBIDOPA-LEVODOPA 25-100 MG PO TABS
1.0000 | ORAL_TABLET | Freq: Four times a day (QID) | ORAL | Status: DC
  Administered 2021-07-03 – 2021-07-05 (×6): 1 via ORAL
  Filled 2021-07-03 (×6): qty 1

## 2021-07-03 MED ORDER — METOPROLOL TARTRATE 5 MG/5ML IV SOLN: 2.0000 mg | INTRAVENOUS | Status: DC | PRN

## 2021-07-03 MED ORDER — TRANEXAMIC ACID-NACL 1000-0.7 MG/100ML-% IV SOLN
1000.0000 mg | INTRAVENOUS | Status: AC
  Administered 2021-07-03: 1000 mg via INTRAVENOUS
  Filled 2021-07-03: qty 100

## 2021-07-03 MED ORDER — ONDANSETRON HCL 4 MG/2ML IJ SOLN: 4.0000 mg | Freq: Four times a day (QID) | INTRAMUSCULAR | Status: DC | PRN

## 2021-07-03 MED ORDER — PHENYLEPHRINE HCL-NACL 20-0.9 MG/250ML-% IV SOLN
INTRAVENOUS | Status: DC | PRN
  Administered 2021-07-03: 40 ug/min via INTRAVENOUS

## 2021-07-03 MED ORDER — POTASSIUM CHLORIDE CRYS ER 20 MEQ PO TBCR: 20.0000 meq | EXTENDED_RELEASE_TABLET | Freq: Every day | ORAL | Status: DC | PRN

## 2021-07-03 MED ORDER — FENTANYL CITRATE (PF) 100 MCG/2ML IJ SOLN
INTRAMUSCULAR | Status: AC
  Administered 2021-07-03: 100 ug
  Filled 2021-07-03: qty 2

## 2021-07-03 MED ORDER — DROPERIDOL 2.5 MG/ML IJ SOLN: 0.6250 mg | Freq: Once | INTRAMUSCULAR | Status: DC | PRN

## 2021-07-03 MED ORDER — DOCUSATE SODIUM 100 MG PO CAPS
100.0000 mg | ORAL_CAPSULE | Freq: Every day | ORAL | Status: DC
  Administered 2021-07-04 – 2021-07-05 (×2): 100 mg via ORAL
  Filled 2021-07-03 (×2): qty 1

## 2021-07-03 MED ORDER — GUAIFENESIN-DM 100-10 MG/5ML PO SYRP: 15.0000 mL | ORAL_SOLUTION | ORAL | Status: DC | PRN

## 2021-07-03 MED ORDER — FENTANYL CITRATE 100 MCG/2ML IJ SOSY: 50.0000 ug | PREFILLED_SYRINGE | Freq: Once | INTRAMUSCULAR | Status: DC

## 2021-07-03 MED ORDER — ROPIVACAINE HCL 5 MG/ML IJ SOLN
INTRAMUSCULAR | Status: DC | PRN
  Administered 2021-07-03: 20 mL via PERINEURAL
  Administered 2021-07-03: 30 mL via PERINEURAL

## 2021-07-03 MED ORDER — PANTOPRAZOLE SODIUM 40 MG PO TBEC
40.0000 mg | DELAYED_RELEASE_TABLET | Freq: Every day | ORAL | Status: DC
  Administered 2021-07-03 – 2021-07-05 (×3): 40 mg via ORAL
  Filled 2021-07-03 (×3): qty 1

## 2021-07-03 MED ORDER — TORSEMIDE 20 MG PO TABS: 20.0000 mg | ORAL_TABLET | ORAL | Status: DC

## 2021-07-03 SURGICAL SUPPLY — 39 items
BAG COUNTER SPONGE SURGICOUNT (BAG) IMPLANT
BAG SPNG CNTER NS LX DISP (BAG)
BLADE SAW RECIP 87.9 MT (BLADE) ×2 IMPLANT
BLADE SURG 21 STRL SS (BLADE) ×2 IMPLANT
BNDG COHESIVE 6X5 TAN STRL LF (GAUZE/BANDAGES/DRESSINGS) IMPLANT
CANISTER WOUND CARE 500ML ATS (WOUND CARE) ×2 IMPLANT
CANISTER WOUNDNEG PRESSURE 500 (CANNISTER) ×1 IMPLANT
COVER SURGICAL LIGHT HANDLE (MISCELLANEOUS) ×2 IMPLANT
CUFF TOURN SGL QUICK 34 (TOURNIQUET CUFF) ×2
CUFF TRNQT CYL 34X4.125X (TOURNIQUET CUFF) ×1 IMPLANT
DRAPE DERMATAC (DRAPES) ×2 IMPLANT
DRAPE INCISE IOBAN 66X45 STRL (DRAPES) ×2 IMPLANT
DRAPE U-SHAPE 47X51 STRL (DRAPES) ×2 IMPLANT
DRESSING PREVENA PLUS CUSTOM (GAUZE/BANDAGES/DRESSINGS) ×1 IMPLANT
DRSG PREVENA PLUS CUSTOM (GAUZE/BANDAGES/DRESSINGS) ×2
DURAPREP 26ML APPLICATOR (WOUND CARE) ×2 IMPLANT
ELECT REM PT RETURN 9FT ADLT (ELECTROSURGICAL) ×2
ELECTRODE REM PT RTRN 9FT ADLT (ELECTROSURGICAL) ×1 IMPLANT
GLOVE SURG ORTHO LTX SZ9 (GLOVE) ×2 IMPLANT
GLOVE SURG UNDER POLY LF SZ9 (GLOVE) ×2 IMPLANT
GOWN STRL REUS W/ TWL XL LVL3 (GOWN DISPOSABLE) ×2 IMPLANT
GOWN STRL REUS W/TWL XL LVL3 (GOWN DISPOSABLE) ×4
KIT BASIN OR (CUSTOM PROCEDURE TRAY) ×2 IMPLANT
KIT TURNOVER KIT B (KITS) ×2 IMPLANT
MANIFOLD NEPTUNE II (INSTRUMENTS) ×2 IMPLANT
NS IRRIG 1000ML POUR BTL (IV SOLUTION) ×2 IMPLANT
PACK ORTHO EXTREMITY (CUSTOM PROCEDURE TRAY) ×2 IMPLANT
PAD ARMBOARD 7.5X6 YLW CONV (MISCELLANEOUS) ×2 IMPLANT
PREVENA RESTOR ARTHOFORM 46X30 (CANNISTER) ×3 IMPLANT
SPONGE T-LAP 18X18 ~~LOC~~+RFID (SPONGE) IMPLANT
STAPLER VISISTAT 35W (STAPLE) IMPLANT
STOCKINETTE IMPERVIOUS LG (DRAPES) ×2 IMPLANT
SUT ETHILON 2 0 PSLX (SUTURE) IMPLANT
SUT SILK 2 0 (SUTURE) ×2
SUT SILK 2-0 18XBRD TIE 12 (SUTURE) ×1 IMPLANT
SUT VIC AB 1 CTX 27 (SUTURE) ×4 IMPLANT
TOWEL GREEN STERILE (TOWEL DISPOSABLE) ×2 IMPLANT
TUBE CONNECTING 12X1/4 (SUCTIONS) ×2 IMPLANT
YANKAUER SUCT BULB TIP NO VENT (SUCTIONS) ×2 IMPLANT

## 2021-07-03 NOTE — Transfer of Care (Signed)
Immediate Anesthesia Transfer of Care Note ? ?Patient: Greg [name] ? ?Procedure(s) Performed: LEFT BELOW KNEE AMPUTATION (Left: Knee) ? ?Patient Location: PACU ? ?Anesthesia Type:MAC and Regional ? ?Level of Consciousness: drowsy and patient cooperative ? ?Airway & Oxygen Therapy: Patient Spontanous Breathing and Patient connected to face mask oxygen ? ?Post-op Assessment: Report given to RN and Post -op Vital signs reviewed and stable ? ?Post vital signs: Reviewed and stable ? ?Last Vitals:  ?Vitals Value Taken Time  ?BP 115/64 07/03/21 1204  ?Temp 36.5 ?C 07/03/21 1205  ?Pulse 75 07/03/21 1206  ?Resp 14 07/03/21 1206  ?SpO2 99 % 07/03/21 1206  ?Vitals shown include unvalidated device data. ? ?Last Pain:  ?Vitals:  ? 07/03/21 1205  ?PainSc: Asleep  ?   ? ?  ? ?Complications: No notable events documented. ?

## 2021-07-03 NOTE — Anesthesia Postprocedure Evaluation (Signed)
Anesthesia Post Note ? ?Patient: [name] ? ?Procedure(s) Performed: LEFT BELOW KNEE AMPUTATION (Left: Knee) ? ?  ? ?Patient location during evaluation: PACU ?Anesthesia Type: Regional ?Level of consciousness: awake and alert ?Pain management: pain level controlled ?Vital Signs Assessment: post-procedure vital signs reviewed and stable ?Respiratory status: spontaneous breathing ?Cardiovascular status: stable ?Anesthetic complications: no ? ? ?No notable events documented. ? ?Last Vitals:  ?Vitals:  ? 07/03/21 1435 07/03/21 1452  ?BP: 95/76 (!) 83/55  ?Pulse: 83 74  ?Resp: 20 11  ?Temp:    ?SpO2: 100% 98%  ?  ?Last Pain:  ?Vitals:  ? 07/03/21 1435  ?PainSc: 0-No pain  ? ? ?  ?  ?  ?  ?  ?  ? ?Greg [name] ? ? ? ? ?

## 2021-07-03 NOTE — Plan of Care (Signed)

## 2021-07-03 NOTE — Anesthesia Procedure Notes (Signed)
Procedure Name: [place] ?Date/Time: 07/03/2021 11:21 AM ?Performed by: Dorthea Cove, CRNA ?Pre-anesthesia Checklist: Patient identified, Emergency Drugs available, Suction available, Patient being monitored and Timeout performed ?Patient Re-evaluated:Patient Re-evaluated prior to induction ?Oxygen Delivery Method: Simple face mask ?Preoxygenation: Pre-oxygenation with 100% oxygen ?Induction Type: IV induction ?Placement Confirmation: positive ETCO2 and CO2 detector ? ? ? ? ?

## 2021-07-03 NOTE — H&P (Signed)
Greg [name] is an 88 y.o. male.   ?Chief Complaint: Chronic necrotic ulcer left heel ?HPI: Greg [name] is an 88 year old gentleman who is seen for initial evaluation and consultation with infectious disease.  Greg [name] has been undergoing prolonged conservative therapy with antibiotics and wound care for the chronic osteomyelitis of the left calcaneus.  Wound cultures have been positive for MRSA. ? ?Past Medical History:  ?Diagnosis Date  ? Acute bronchiolitis   ? Acute osteomyelitis of calcaneum, left ([place]) 01/11/2021  ? Anemia   ? Bursitis of both hips   ? Complication of anesthesia   ? Greg [name] states Greg [name] was a little "loopy" for several days  ? Greg [name] ([place])   ? due to Parkinson's  ? Hearing reduced   ? hearing aid  ? History of basal cell carcinoma   ? Hyperlipidemia   ? Malaise and fatigue   ? MRSA infection 01/11/2021  ? Osteoarthritis   ? hand  ? Osteoporosis   ? Palpitation   ? Parkinson's disease ([place])   ? Peripheral vascular disease ([place])   ? blood clots  ? Pre-diabetes   ? Vitamin D deficiency   ? ? ?Past Surgical History:  ?Procedure Laterality Date  ? ABDOMINAL AORTOGRAM W/LOWER EXTREMITY N/A 06/12/2021  ? Procedure: ABDOMINAL AORTOGRAM W/LOWER EXTREMITY;  Surgeon: Wellington Hampshire, MD;  Location: [facility];  Service: Cardiovascular;  Laterality: N/A;  ? CATARACT EXTRACTION  05/31/2015  ? Eye/right  ? COLONOSCOPY  02/05/2005, 03/06/2005  ? normal, 10 years ago  ? MOHS micrographic surgery    ? Face surgery  ? ORIF ACETABULAR FRACTURE Right 10/09/2020  ? Procedure: OPEN REDUCTION INTERNAL FIXATION (ORIF) ACETABULAR FRACTURE;  Surgeon: Altamese [place], MD;  Location: [facility];  Service: Orthopedics;  Laterality: Right;  ? ? ?Family History  ?Problem Relation Age of Onset  ? Prostate cancer Father   ? Skin cancer Brother   ? ?Social History:  reports that Greg [name] has never smoked. Greg [name] has never used smokeless tobacco. Greg [name] reports that Greg [name] does not drink alcohol and does not use drugs. ? ?Allergies: No Known  Allergies ? ?No medications prior to admission.  ? ? ?No results found for this or any previous visit (from the past 48 hour(s)). ?XR Foot 2 Views Left ? ?Result Date: 07/01/2021 ?2 view radiographs of the left foot shows extensive soft tissue defect over the calcaneus posteriorly as well as severe peripheral vascular disease with calcification of the arteries down through the foot.  ? ?Review of Systems  ?All other systems reviewed and are negative. ? ?There were no vitals taken for this visit. ?Physical Exam  ?Greg [name] is alert, oriented, no adenopathy, well-dressed, normal affect, normal respiratory effort. ?On examination the Doppler was used and Greg [name] has a strong dopplerable biphasic dorsalis pedis and posterior tibial pulse Greg [name] has a large chronic ulcer over the left calcaneus Greg [name] has cellulitis of the foot and ankle as well as swelling.  Greg [name] has multiple medical problems including peripheral vascular disease Parkinson's disease stage IIIb kidney disease.  Sed rate is 55 and a CRP is 31.9.  Hemoglobin is 10.9 with an albumin of 2.9. ?Assessment/Plan ?1. Pain in left foot   ?2. Subacute osteomyelitis, left ankle and foot ([place])   ?  ?  ?Plan: Recommended proceeding with a transtibial amputation on the left.  Anticipate Greg [name] returning to [facility] at the skilled nursing level approximately 3 to 5 days after surgery.  ? ?Newt Minion, MD ?07/03/2021, 6:51 AM ? ? ? ?

## 2021-07-03 NOTE — Anesthesia Procedure Notes (Signed)
Anesthesia Regional Block: Adductor canal block  ? ?Pre-Anesthetic Checklist: , timeout performed,  Correct Patient, Correct Site, Correct Laterality,  Correct Procedure, Correct Position, site marked,  Risks and benefits discussed,  Surgical consent,  Pre-op evaluation,  At surgeon's request and post-op pain management ? ?Laterality: Lower and Left ? ?Prep: chloraprep     ?  ?Needles:  ?Injection technique: Single-shot ? ?Needle Type: Stimiplex   ? ? ?Needle Length: 9cm  ?Needle Gauge: 21  ? ? ? ?Additional Needles: ? ? ?Procedures:,,,, ultrasound used (permanent image in chart),,    ?Narrative:  ?Start time: 07/03/2021 9:00 AM ?End time: 07/03/2021 9:12 AM ?Injection made incrementally with aspirations every 5 mL. ? ?Performed by: Personally  ?Anesthesiologist: Nolon Nations, MD ? ?Additional Notes: ?BP cuff, EKG monitors applied. Sedation begun. Artery and nerve location verified with ultrasound. Anesthetic injected incrementally (53ml), slowly, and after negative aspirations under direct u/s guidance. Good fascial/perineural spread. Tolerated well. ? ? ? ? ?

## 2021-07-03 NOTE — Op Note (Signed)
07/03/2021 ? ?12:14 PM ? ?PATIENT:  Greg [name]   ? ?PRE-OPERATIVE DIAGNOSIS:  Osteomyelitis Left Foot ? ?POST-OPERATIVE DIAGNOSIS:  Same ? ?PROCEDURE:  LEFT BELOW KNEE AMPUTATION ? ?SURGEON:  Nadara Mustard, MD ? ?ANESTHESIA:   General ? ?PREOPERATIVE INDICATIONS:  Greg [name] is a  88 y.o. male with a diagnosis of Osteomyelitis Left Foot who failed conservative measures and elected for surgical management.   ? ?The risks benefits and alternatives were discussed with the patient preoperatively including but not limited to the risks of infection, bleeding, nerve injury, cardiopulmonary complications, the need for revision surgery, among others, and the patient was willing to proceed. ? ?OPERATIVE IMPLANTS: prevena custom and arthroform dressing ? ? ?OPERATIVE FINDINGS: Patient had extremely calcified vessels there was good petechial bleeding and healthy viable muscle. ? ?OPERATIVE PROCEDURE: Patient was brought to the operating room after undergoing a regional anesthetic.  After adequate levels anesthesia were obtained a thigh tourniquet was placed and the lower extremity was prepped using DuraPrep draped into a sterile field. The foot was draped out of the sterile field with impervious stockinette.  A timeout was called and the tourniquet inflated.  A transverse skin incision was made 12 cm distal to the tibial tubercle, the incision curved proximally, and a large posterior flap was created.  The tibia was transected just proximal to the skin incision and beveled anteriorly.  The fibula was transected just proximal to the tibial incision.  The sciatic nerve was pulled cut and allowed to retract.  The vascular bundles were suture ligated with 2-0 silk.  The tourniquet was deflated and hemostasis obtained.  The deep and superficial fascial layers were closed using #1 Vicryl.  The skin was closed using staples.  The Prevena customizable dressing was applied this was overwrapped with the arthroform sponge.  Collier Flowers  was used to secure the sponges and the circumferential compression was secured to the skin with Dermatac.  This was connected to the wound VAC pump and had a good suction fit this was covered with a stump shrinker and a limb protector.  Patient was taken to the PACU in stable condition. ? ? ?DISCHARGE PLANNING: ? ?Antibiotic duration: 24-hour antibiotics ? ?Weightbearing: Nonweightbearing on the operative extremity ? ?Pain medication: Opioid pathway ? ?Dressing care/ Wound VAC: Continue wound VAC with the Prevena plus pump at discharge for 1 week ? ?Ambulatory devices: Walker or kneeling scooter ? ?Discharge to: Discharge planning based on recommendations per physical therapy ? ?Follow-up: In the office 1 week after discharge. ? ? ? ? ? ? ? ? ?

## 2021-07-03 NOTE — Anesthesia Preprocedure Evaluation (Addendum)
Anesthesia Evaluation  ?Patient identified by MRN, date of birth, ID band ?Patient awake ? ? ? ?Reviewed: ?Allergy & Precautions, H&P , NPO status , Patient's Chart, lab work & pertinent test results, reviewed documented beta blocker date and time  ? ?Airway ?Mallampati: III ? ?TM Distance: >3 FB ?Neck ROM: full ? ? ? Dental ? ?(+) Teeth Intact, Dental Advisory Given, Poor Dentition ?  ?Pulmonary ?neg pulmonary ROS,  ?  ?Pulmonary exam normal ?breath sounds clear to auscultation ? ? ? ? ? ? Cardiovascular ?Exercise Tolerance: Good ?hypertension, + angina + Peripheral Vascular Disease  ? ?Rhythm:regular Rate:Normal ?+ Systolic murmurs ?Echo 02/2021 ??1. Left ventricular ejection fraction, by estimation, is 60 to 65%. The left ventricle has normal function. The left ventricle has no regional wall motion abnormalities. There is mild asymmetric left ventricular hypertrophy of the basal and septal segments. Left ventricular diastolic parameters are consistent with Grade I diastolic dysfunction (impaired relaxation).  ??2. Right ventricular systolic function is normal. The right ventricular size is normal.  ??3. The mitral valve is degenerative. No evidence of mitral valve regurgitation. No evidence of mitral stenosis. Moderate mitral annular calcification.  ??4. The aortic valve is tricuspid. There is moderate calcification of the aortic valve. There is moderate thickening of the aortic valve. Aortic valve regurgitation is not visualized. Sclerosis without stenosis.  ??5. The inferior vena cava is normal in size with greater than 50% respiratory variability, suggesting right atrial pressure of 3 mmHg.  ?  ?Neuro/Psych ?PSYCHIATRIC DISORDERS Dementia  Neuromuscular disease   ? GI/Hepatic ?negative GI ROS, Neg liver ROS,   ?Endo/Other  ?negative endocrine ROS ? Renal/GU ?Renal disease  ?negative genitourinary ?  ?Musculoskeletal ? ?(+) Arthritis , Osteoarthritis,   ? Abdominal ?  ?Peds ?  Hematology ? ?(+) Blood dyscrasia, anemia ,   ?Anesthesia Other Findings ? ? Reproductive/Obstetrics ?negative OB ROS ? ?  ? ? ? ? ? ? ? ? ? ? ? ? ? ?  ?  ? ? ? ? ? ? ?Anesthesia Physical ? ?Anesthesia Plan ? ?ASA: 3 ? ?Anesthesia Plan: Regional  ? ?Post-op Pain Management: Regional block*  ? ?Induction: Intravenous ? ?PONV Risk Score and Plan: 2 and Ondansetron, Treatment may vary due to age or medical condition, Propofol infusion and TIVA ? ?Airway Management Planned: Natural Airway ? ?Additional Equipment: None ? ?Intra-op Plan:  ? ?Post-operative Plan:  ? ?Informed Consent: I have reviewed the patients History and Physical, chart, labs and discussed the procedure including the risks, benefits and alternatives for the proposed anesthesia with the patient or authorized representative who has indicated his/her understanding and acceptance.  ? ? ? ?Dental advisory given ? ?Plan Discussed with: CRNA ? ?Anesthesia Plan Comments: ( ? ?)  ? ? ? ? ? ?Anesthesia Quick Evaluation ? ?

## 2021-07-03 NOTE — Progress Notes (Signed)
Orthopedic Tech Progress Note ?Patient Details:  ?MARLYN RABINE ?04/04/1932 ?[id number] ? ?Called in order to [employer] for an AMPUSHIELD BKA with the works  ? ?Patient ID: ELDON ZIETLOW, male   DOB: Jun 28, 1931, 88 y.o.   MRN: [id number] ? ?Donald Pore ?07/03/2021, 11:22 AM ? ?

## 2021-07-03 NOTE — Anesthesia Procedure Notes (Signed)
Anesthesia Regional Block: Popliteal block  ? ?Pre-Anesthetic Checklist: , timeout performed,  Correct Patient, Correct Site, Correct Laterality,  Correct Procedure, Correct Position, site marked,  Risks and benefits discussed,  Surgical consent,  Pre-op evaluation,  At surgeon's request and post-op pain management ? ?Laterality: Lower and Left ? ?Prep: chloraprep     ?  ?Needles:  ?Injection technique: Single-shot ? ?Needle Type: Stimiplex   ? ? ?Needle Length: 10cm  ?Needle Gauge: 21  ? ? ? ?Additional Needles: ? ? ?Procedures:,,,, ultrasound used (permanent image in chart),,   ?Motor weakness within 5 minutes.  ?Narrative:  ?Start time: 07/03/2021 9:12 AM ?End time: 07/03/2021 9:20 AM ?Injection made incrementally with aspirations every 5 mL. ? ?Performed by: Personally  ?Anesthesiologist: Lewie Loron, MD ? ?Additional Notes: ?Nerve located and needle positioned with direct ultrasound guidance. Good perineural spread. Patient tolerated well. ? ? ? ? ?

## 2021-07-04 ENCOUNTER — Encounter (HOSPITAL_COMMUNITY): Payer: Self-pay | Admitting: Orthopedic Surgery

## 2021-07-04 LAB — CBC
HCT: 24 % — ABNORMAL LOW (ref 39.0–52.0)
Hemoglobin: 7.6 g/dL — ABNORMAL LOW (ref 13.0–17.0)
MCH: 29.1 pg (ref 26.0–34.0)
MCHC: 31.7 g/dL (ref 30.0–36.0)
MCV: 92 fL (ref 80.0–100.0)
Platelets: 179 10*3/uL (ref 150–400)
RBC: 2.61 MIL/uL — ABNORMAL LOW (ref 4.22–5.81)
RDW: 15.9 % — ABNORMAL HIGH (ref 11.5–15.5)
WBC: 8.8 10*3/uL (ref 4.0–10.5)
nRBC: 0 % (ref 0.0–0.2)

## 2021-07-04 MED ORDER — CHLORHEXIDINE GLUCONATE CLOTH 2 % EX PADS
6.0000 | MEDICATED_PAD | Freq: Every day | CUTANEOUS | Status: DC
  Administered 2021-07-04 – 2021-07-05 (×2): 6 via TOPICAL

## 2021-07-04 NOTE — [place] FL2 (Signed)
?[place] MEDICAID FL2 LEVEL OF CARE SCREENING TOOL  ?  ? ?IDENTIFICATION  ?Patient Name: ?Greg [name] Birthdate: 17-Dec-1931 Sex: male Admission Date (Current Location): ?07/03/2021  ?[place] Number: ? [place] ?  Facility and Address:  ?[facility], [address], [place], [place] [address] ?     Provider Number: ?[id number]  ?Attending Physician Name and Address:  ?Nadara Mustard, MD ? Relative Name and Phone Number:  ?Crocker,jane Spouse [phone]  [phone] ?   ?Current Level of Care: ?Hospital Recommended Level of Care: ?Skilled Nursing Facility Prior Approval Number: ?  ? ?Date Approved/Denied: ?  PASRR Number: ?  ? ?Discharge Plan: ?SNF ?  ? ?Current Diagnoses: ?Patient Active Problem List  ? Diagnosis Date Noted  ? Below-knee amputation of left lower extremity (HCC) 07/03/2021  ? Vitamin B12 deficiency 03/05/2021  ? Candida rash of groin 03/05/2021  ? (HFpEF) heart failure with preserved ejection fraction (HCC) 02/04/2021  ? Acute osteomyelitis of left calcaneus (HCC) 01/11/2021  ? MRSA infection 01/11/2021  ? CKD (chronic kidney disease) stage 3, GFR 30-59 ml/min (HCC) 01/08/2021  ? DVT of deep femoral vein, right (HCC) 12/22/2020  ? Pressure ulcer, unstageable (HCC) 11/12/2020  ? Hyponatremia 10/15/2020  ? Prediabetes 10/15/2020  ? HTN (hypertension) 10/15/2020  ? Slow transit constipation 10/15/2020  ? Blood loss anemia 10/15/2020  ? Closed displaced fracture of right acetabulum (HCC)   ? Closed right hip fracture (HCC) 10/08/2020  ? Parkinson's disease (HCC) 09/25/2020  ? BPH (benign prostatic hyperplasia) 09/25/2020  ? Edema 09/25/2020  ? Osteoarthritis, multiple sites 09/25/2020  ? Osteoporosis 09/25/2020  ? Hyperlipidemia 09/25/2020  ? ? ?Orientation RESPIRATION BLADDER Height & Weight   ?  ?Self, Time, Situation, Place ? Normal Incontinent Weight: 187 lb (84.8 kg) ?Height:  5\' 10"  (177.8 cm)  ?BEHAVIORAL SYMPTOMS/MOOD NEUROLOGICAL BOWEL NUTRITION STATUS   ?    Continent Diet (see discharge summary)  ?AMBULATORY STATUS COMMUNICATION OF NEEDS Skin   ?Total Care Verbally Surgical wounds ?  ?  ?  ?    ?     ?     ? ? ?Personal Care Assistance Level of Assistance  ?Bathing, Feeding, Dressing, Total care Bathing Assistance: Maximum assistance ?Feeding assistance: Independent ?Dressing Assistance: Maximum assistance ?Total Care Assistance: Maximum assistance  ? ?Functional Limitations Info  ?Sight, Hearing, Speech Sight Info: Adequate ?Hearing Info: Adequate ?Speech Info: Adequate  ? ? ?SPECIAL CARE FACTORS FREQUENCY  ?PT (By licensed PT), OT (By licensed OT)   ?  ?PT Frequency: 5x week ?OT Frequency: 5x week ?  ?  ?  ?   ? ? ?Contractures Contractures Info: Not present  ? ? ?Additional Factors Info  ?Code Status, Allergies Code Status Info: full ?Allergies Info: NKA ?  ?  ?  ?   ? ?Current Medications (07/04/2021):  This is the current hospital active medication list ?Current Facility-Administered Medications  ?Medication Dose Route Frequency Provider Last Rate Last Admin  ? 0.9 %  sodium chloride infusion   Intravenous Continuous Nadara Mustard, MD   Stopped at 07/04/21 [id number]  ? acetaminophen (TYLENOL) tablet 325-650 mg  325-650 mg Oral Q6H PRN Nadara Mustard, MD      ? albuterol (PROVENTIL) (2.5 MG/3ML) 0.083% nebulizer solution 3 mL  3 mL Inhalation Q4H PRN Nadara Mustard, MD      ? alum & mag hydroxide-simeth (MAALOX/MYLANTA) 200-200-20 MG/5ML suspension 15-30 mL  15-30 mL Oral Q2H PRN Aldean Baker  V, MD      ? apixaban (ELIQUIS) tablet 5 mg  5 mg Oral BID Newt Minion, MD   5 mg at 07/04/21 [id number]  ? ascorbic acid (VITAMIN C) tablet 1,000 mg  1,000 mg Oral Daily Newt Minion, MD   1,000 mg at 07/04/21 [id number]  ? bisacodyl (DULCOLAX) EC tablet 5 mg  5 mg Oral Daily PRN Newt Minion, MD      ? carbidopa-levodopa (SINEMET CR) 50-200 MG per tablet controlled release 1 tablet  1 tablet Oral QHS Newt Minion, MD   1 tablet at 07/03/21 2218  ? carbidopa-levodopa (SINEMET  IR) 25-100 MG per tablet immediate release 1 tablet  1 tablet Oral QID Newt Minion, MD   1 tablet at 07/04/21 1555  ? Chlorhexidine Gluconate Cloth 2 % PADS 6 each  6 each Topical Daily Newt Minion, MD   6 each at 07/04/21 [phone]  ? docusate sodium (COLACE) capsule 100 mg  100 mg Oral Daily Newt Minion, MD   100 mg at 07/04/21 [id number]  ? fentaNYL (SUBLIMAZE) injection 50 mcg  50 mcg Intravenous Once Newt Minion, MD      ? guaiFENesin-dextromethorphan [facility] DM) 100-10 MG/5ML syrup 15 mL  15 mL Oral Q4H PRN Newt Minion, MD      ? hydrALAZINE (APRESOLINE) injection 5 mg  5 mg Intravenous Q20 Min PRN Newt Minion, MD      ? HYDROmorphone (DILAUDID) injection 0.5-1 mg  0.5-1 mg Intravenous Q4H PRN Newt Minion, MD      ? labetalol (NORMODYNE) injection 10 mg  10 mg Intravenous Q10 min PRN Newt Minion, MD      ? magnesium sulfate IVPB 2 g 50 mL  2 g Intravenous Daily PRN Newt Minion, MD      ? metoprolol tartrate (LOPRESSOR) injection 2-5 mg  2-5 mg Intravenous Q2H PRN Newt Minion, MD      ? nutrition supplement (JUVEN) (JUVEN) powder packet 1 packet  1 packet Oral BID BM Newt Minion, MD   1 packet at 07/04/21 1555  ? ondansetron (ZOFRAN) injection 4 mg  4 mg Intravenous Q6H PRN Newt Minion, MD      ? oxyCODONE (Oxy IR/ROXICODONE) immediate release tablet 10-15 mg  10-15 mg Oral Q4H PRN Newt Minion, MD      ? oxyCODONE (Oxy IR/ROXICODONE) immediate release tablet 5-10 mg  5-10 mg Oral Q4H PRN Newt Minion, MD   10 mg at 07/04/21 [id number]  ? pantoprazole (PROTONIX) EC tablet 40 mg  40 mg Oral Daily Newt Minion, MD   40 mg at 07/04/21 [id number]  ? phenol (CHLORASEPTIC) mouth spray 1 spray  1 spray Mouth/Throat PRN Newt Minion, MD      ? polyethylene glycol (MIRALAX / GLYCOLAX) packet 17 g  17 g Oral Daily PRN Newt Minion, MD      ? potassium chloride SA (KLOR-CON M) CR tablet 20-40 mEq  20-40 mEq Oral Daily PRN Newt Minion, MD      ? sodium phosphate (FLEET) 7-19 GM/118ML enema 1  enema  1 enema Rectal Once PRN Newt Minion, MD      ? Derrill Memo ON 07/05/2021] torsemide [facility]) tablet 20 mg  20 mg Oral Once per day on Sun Mon Wed Fri Sat Newt Minion, MD      ? torsemide [facility]) tablet 40 mg  40 mg Oral Once  per day on Tue Thu Duda, Marcus V, MD   40 mg at 07/04/21 [id number]  ? zinc sulfate capsule 220 mg  220 mg Oral Daily Newt Minion, MD   220 mg at 07/04/21 [id number]  ? ? ? ?Discharge Medications: ?Please see discharge summary for a list of discharge medications. ? ?Relevant Imaging Results: ? ?Relevant Lab Results: ? ? ?Additional Information ?SSN [id number] Pt is covid vaccinated x4 ? ?Joanne Chars, LCSW ? ? ? ? ?

## 2021-07-04 NOTE — Plan of Care (Signed)

## 2021-07-04 NOTE — Evaluation (Signed)
Occupational Therapy Evaluation ?Patient Details ?Name: Greg [name] ?MRN: [id number] ?DOB: 10/01/1931 ?Today's Date: 07/04/2021 ? ? ?History of Present Illness Pt is an 88 y.o. male admitted 07/03/21 with L foot osteomyelitis. 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 PMH includes dementia, Parkinson's disease, osteoporosis, R hip fx s/p ORIF (10/2020), PVD.  ? ?Clinical Impression ?  ?Pt resides in SNF at [facility]. He is assisted to pivot and uses a w/c as his primary means of mobility. Pt self feeds and grooms, he is otherwise dependent in ADLs. Pt with emotional lability during session, partly due to loss of limb, later tearful when talking about his work history. Pt requires min assist for supine<>sit and to laterally scoot along EOB. He stood pulling up on back of recliner x 3 with +2 mod assist. Pt works hard and is highly motivated to be as independent as possible. Will follow acutely, recommend return to SNF.  ?   ? ?Recommendations for follow up therapy are one component of a multi-disciplinary discharge planning process, led by the attending physician.  Recommendations may be updated based on patient status, additional functional criteria and insurance authorization.  ? ?Follow Up Recommendations ? Skilled nursing-short term rehab (<3 hours/day)  ?  ?Assistance Recommended at Discharge Frequent or constant Supervision/Assistance  ?Patient can return home with the following A lot of help with bathing/dressing/bathroom;Two people to help with walking and/or transfers;Assistance with cooking/housework;Direct supervision/assist for medications management;Direct supervision/assist for financial management;Assist for transportation;Help with stairs or ramp for entrance ? ?  ?Functional Status Assessment ? Patient has had a recent decline in their functional status and demonstrates the ability to make significant improvements in function in a reasonable and predictable amount of time.  ?Equipment Recommendations ? None recommended by  OT  ?  ?Recommendations for Other Services   ? ? ?  ?Precautions / Restrictions Precautions ?Precautions: Fall;Other (comment) ?Precaution Comments: L BKA wound vac ?Required Braces or Orthoses: Other Brace ?Other Brace: limb protector ?Restrictions ?Weight Bearing Restrictions: Yes ?LLE Weight Bearing: Non weight bearing  ? ?  ? ?Mobility Bed Mobility ?Overal bed mobility: Needs Assistance ?Bed Mobility: Rolling, Supine to Sit, Sit to Supine ?Rolling: Max assist ?  ?Supine to sit: Min assist, HOB elevated ?Sit to supine: Min assist, HOB elevated ?  ?General bed mobility comments: heavy reliance on HOB elevated and use of bed rail; minA for trunk elevation, increased time and effort scooting hips and BLE management; minA for LE management return to supine, assist to position pt on R side using bed pad ?  ? ?Transfers ?Overall transfer level: Needs assistance ?  ?Transfers: Sit to/from Stand ?Sit to Stand: +2 physical assistance, Mod assist ?  ?  ?  ?  ? Lateral/Scoot Transfers: Min assist ?General transfer comment: performed 3x sit<>stand from elevated EOB with BUEs pulling on rails on back of recliner, modA+1-2 for trunk elevation; pt with difficulty achieving fully upright due to R knee flexion and difficulty extending hips fully. pt able to perform lateral scoot transfer towards [facility] with minA for scooting hips ?  ? ?  ?Balance Overall balance assessment: Needs assistance ?Sitting-balance support: No upper extremity supported ?Sitting balance-Leahy Scale: Fair ?  ?  ?  ?Standing balance-Leahy Scale: Poor ?Standing balance comment: reliant on BUE support and external assist; dependent for pericare ?  ?  ?  ?  ?  ?  ?  ?  ?  ?  ?  ?   ? ?ADL either performed or assessed with clinical  judgement  ? ?ADL Overall ADL's : Needs assistance/impaired ?Eating/Feeding: Independent;Bed level ?  ?Grooming: Wash/dry hands;Wash/dry face;Sitting;Supervision/safety ?  ?Upper Body Bathing: Sitting;Moderate assistance ?  ?Lower Body  Bathing: Total assistance;+2 for physical assistance;Sit to/from stand ?  ?Upper Body Dressing : Minimal assistance;Sitting ?  ?Lower Body Dressing: Total assistance;+2 for physical assistance;Sit to/from stand ?  ?  ?  ?Toileting- Clothing Manipulation and Hygiene: +2 for physical assistance;Total assistance;Sit to/from stand ?  ?  ?  ?  ?   ? ? ? ?Vision Baseline Vision/History: 1 Wears glasses ?Ability to See in Adequate Light: 0 Adequate ?Patient Visual Report: No change from baseline ?   ?   ?Perception   ?  ?Praxis   ?  ? ?Pertinent Vitals/Pain Pain Assessment ?Pain Assessment: No/denies pain  ? ? ? ?Hand Dominance Right ?  ?Extremity/Trunk Assessment Upper Extremity Assessment ?Upper Extremity Assessment: Overall WFL for tasks assessed (arthritic changes in hands, slowed movement) ?  ?Lower Extremity Assessment ?Lower Extremity Assessment: Defer to PT evaluation ?RLE Deficits / Details: reports h/o knee flexion contracture "for 80 years now"; hip strength functionally <3/5 strength ?LLE Deficits / Details: s/p L BKA; hip at least 3/5 strength; lacking full knee ROM ?  ?Cervical / Trunk Assessment ?Cervical / Trunk Assessment: Kyphotic ?  ?Communication Communication ?Communication: Expressive difficulties ?  ?Cognition Arousal/Alertness: Awake/alert ?[facility] for tasks assessed/performed ?Overall Cognitive Status: History of cognitive impairments - at baseline ?  ?  ?  ?  ?  ?  ?  ?  ?  ?  ?  ?  ?  ?  ?  ?  ?General Comments: H/o dementia, parkinson's disease. pt demonstrates good awareness of medical history, although timeline not fully accurate ?  ?  ?General Comments  initiated post-op BKA exercise - limb guard (doffed for skin check and knee flex/ext, redonned), phantom limb pain, desensitization, edema control, positioning, therex ? ?  ?Exercises   ?  ?Shoulder Instructions    ? ? ?Home Living Family/patient expects to be discharged to:: Skilled nursing facility ?  ?  ?  ?  ?  ?  ?  ?   ?  ?  ?  ?  ?  ?  ?  ?  ?Additional Comments: [facility]; pt resides in SNF since hip fx 10/2020, wife resides in ALF ?  ? ?  ?Prior Functioning/Environment Prior Level of Function : Needs assist ?  ?  ?  ?Physical Assist : Mobility (physical);ADLs (physical) ?  ?  ?Mobility Comments: pt requires assist for w/c transfers - sometimes stand pivot with RW or scooting transfers; reports "I haven't gotten my balance back" ?ADLs Comments: assist from staff for ADL set-up, lower body dressing, bathing; use of grab bar in bathroom ?  ? ?  ?  ?OT Problem List: Decreased strength;Impaired balance (sitting and/or standing);Decreased cognition;Decreased coordination ?  ?   ?OT Treatment/Interventions: Self-care/ADL training;DME and/or AE instruction;Therapeutic activities;Patient/family education;Balance training  ?  ?OT Goals(Current goals can be found in the care plan section) Acute Rehab OT Goals ?OT Goal Formulation: With patient ?Time For Goal Achievement: 07/18/21 ?Potential to Achieve Goals: Good ?ADL Goals ?Pt Will Perform Grooming: with set-up;sitting ?Pt Will Perform Upper Body Bathing: (P) with min assist;sitting ?Pt Will Perform Upper Body Dressing: with supervision;sitting ?Pt Will Transfer to Toilet: with min assist;bedside commode;with transfer board ?Additional ADL Goal #1: Pt will perform supine>sit with supervision and use of rail in preparation for ADLs.  ?  OT Frequency: Min 2X/week ?  ? ?Co-evaluation PT/OT/SLP Co-Evaluation/Treatment: Yes ?Reason for Co-Treatment: For patient/therapist safety ?PT goals addressed during session: Mobility/safety with mobility;Balance ?OT goals addressed during session: ADL's and self-care;Strengthening/ROM ?  ? ?  ?AM-PAC OT "6 Clicks" Daily Activity     ?Outcome Measure Help from another person eating meals?: None ?Help from another person taking care of personal grooming?: A Little ?Help from another person toileting, which includes using toliet, bedpan, or  urinal?: Total ?Help from another person bathing (including washing, rinsing, drying)?: A Lot ?Help from another person to put on and taking off regular upper body clothing?: A Little ?Help from another pe

## 2021-07-04 NOTE — TOC Initial Note (Signed)
Transition of Care (TOC) - Initial/Assessment Note  ? ? ?Patient Details  ?Name: Greg [name] ?MRN: [id number] ?Date of Birth: 25-Aug-1931 ? ?Transition of Care [facility]) CM/SW Contact:    ?Joanne Chars, LCSW ?Phone Number: ?07/04/2021, 4:13 PM ? ?Clinical Narrative:       CSW met with pt and wife Opal Sidles regarding DC recommendation for SNF.  Permission given to speak with wife present.  They are residents at [facility].  Pt has been in the skilled nursing/rehab section there prior to his current admission and they are planning on him to return there as well.  Pt is vaccinated for covid with 2 boosters.          ? ? ?Expected Discharge Plan: [place] ?Barriers to Discharge: Continued Medical Work up ? ? ?Patient Goals and CMS Choice ?  ?CMS Medicare.gov Compare Post Acute Care list provided to::  (pt is from [facility], wants to return) ?  ? ?Expected Discharge Plan and Services ?Expected Discharge Plan: [place] ?In-house Referral: Clinical Social Work ?  ?Post Acute Care Choice: [facility] ?Living arrangements for the past 2 months: [place] ?                ?  ?  ?  ?  ?  ?  ?  ?  ?  ?  ? ?Prior Living Arrangements/Services ?Living arrangements for the past 2 months: [place] ?Lives with:: Facility Resident ?Patient language and need for interpreter reviewed:: Yes ?Do you feel safe going back to the place where you live?: Yes      ?Need for Family Participation in Patient Care: Yes (Comment) ?Care giver support system in place?: Yes (comment) ?Current home services: Other (comment) (na) ?Criminal Activity/Legal Involvement Pertinent to Current Situation/Hospitalization: No - Comment as needed ? ?Activities of Daily Living ?Home Assistive Devices/Equipment: Wheelchair ?ADL Screening (condition at time of admission) ?Patient's cognitive ability adequate to safely complete daily activities?: Yes ?Is the patient deaf or have  difficulty hearing?: Yes ?Does the patient have difficulty seeing, even when wearing glasses/contacts?: No ?Does the patient have difficulty concentrating, remembering, or making decisions?: No ?Patient able to express need for assistance with ADLs?: Yes ?Does the patient have difficulty dressing or bathing?: Yes ?Independently performs ADLs?: No ?Communication: Independent ?Dressing (OT): Needs assistance ?Is this a change from baseline?: Pre-admission baseline ?Grooming: Needs assistance ?Is this a change from baseline?: Pre-admission baseline ?Feeding: Independent ?Bathing: Needs assistance ?Is this a change from baseline?: Pre-admission baseline ?Toileting: Needs assistance ?Is this a change from baseline?: Pre-admission baseline ?In/Out Bed: Needs assistance ?Is this a change from baseline?: Pre-admission baseline ?Walks in Home: Dependent ?Is this a change from baseline?: Pre-admission baseline ?Does the patient have difficulty walking or climbing stairs?: Yes ?Weakness of Legs: None ?Weakness of Arms/Hands: None ? ?Permission Sought/Granted ?Permission sought to share information with : Family Supports ?Permission granted to share information with : Yes, Verbal Permission Granted ? Share Information with NAME: wife Opal Sidles ? Permission granted to share info w AGENCY: [place] ?   ?   ? ?Emotional Assessment ?Appearance:: Appears stated age ?Attitude/Demeanor/Rapport: Engaged ?Affect (typically observed): Pleasant ?Orientation: : Oriented to Self, Oriented to Place, Oriented to  Time, Oriented to Situation ?Alcohol / Substance Use: Not Applicable ?Psych Involvement: No (comment) ? ?Admission diagnosis:  Below-knee amputation of left lower extremity ([place]) [U31.497W] ?Patient Active Problem List  ? Diagnosis Date Noted  ? Below-knee  amputation of left lower extremity ([place]) 07/03/2021  ? Vitamin B12 deficiency 03/05/2021  ? Candida rash of groin 03/05/2021  ? (HFpEF) heart failure with preserved ejection  fraction ([place]) 02/04/2021  ? Acute osteomyelitis of left calcaneus ([place]) 01/11/2021  ? MRSA infection 01/11/2021  ? CKD (chronic kidney disease) stage 3, GFR 30-59 ml/min (HCC) 01/08/2021  ? DVT of deep femoral vein, right ([place]) 12/22/2020  ? Pressure ulcer, unstageable ([place]) 11/12/2020  ? Hyponatremia 10/15/2020  ? Prediabetes 10/15/2020  ? HTN (hypertension) 10/15/2020  ? Slow transit constipation 10/15/2020  ? Blood loss anemia 10/15/2020  ? Closed displaced fracture of right acetabulum ([place])   ? Closed right hip fracture ([place]) 10/08/2020  ? Parkinson's disease ([place]) 09/25/2020  ? BPH (benign prostatic hyperplasia) 09/25/2020  ? Edema 09/25/2020  ? Osteoarthritis, multiple sites 09/25/2020  ? Osteoporosis 09/25/2020  ? Hyperlipidemia 09/25/2020  ? ?PCP:  Virgie Dad, MD ?Pharmacy:   ?[facility] [id number] [place], [place] ?[place] ?[place] [address] ?Phone: [phone] Fax: [phone] ? ? ? ? ?Social Determinants of Health (SDOH) Interventions ?  ? ?Readmission Risk Interventions ?   ? View : No data to display.  ?  ?  ?  ? ? ? ?

## 2021-07-04 NOTE — Plan of Care (Signed)
  Problem: Education: Goal: Knowledge of General Education information will improve Description: Including pain rating scale, medication(s)/side effects and non-pharmacologic comfort measures Outcome: Progressing   Problem: Clinical Measurements: Goal: Ability to maintain clinical measurements within normal limits will improve Outcome: Progressing   Problem: Activity: Goal: Risk for activity intolerance will decrease Outcome: Progressing   Problem: Nutrition: Goal: Adequate nutrition will be maintained Outcome: Progressing   Problem: Coping: Goal: Level of anxiety will decrease Outcome: Progressing   Problem: Elimination: Goal: Will not experience complications related to bowel motility Outcome: Progressing   Problem: Safety: Goal: Ability to remain free from injury will improve Outcome: Progressing   

## 2021-07-04 NOTE — Progress Notes (Signed)
?  Inpatient Rehabilitation Admissions Coordinator  ? ?Patient is resident at [facility] at [facility] since 7/22. Wife is at the ALF there. Recommend return to SNF level as he has planned. We will not pursue CIR at this time. I will alert acute team. ? ?Ottie Glazier, RN, MSN ?Rehab Admissions Coordinator ?[phone] ?07/04/2021 11:30 AM ? ?

## 2021-07-04 NOTE — Progress Notes (Signed)
Patient ID: Greg [name], male   DOB: [birth year], 88 y.o.   MRN: [id number] ?Patient is an 88 year old gentleman postop day 1 left below-knee amputation.  There is no drainage in the wound VAC canister.  Patient states he has no pain.  Plan for physical therapy with discharge planning based on their recommendations. ?

## 2021-07-04 NOTE — Evaluation (Signed)
Physical Therapy Evaluation ?Patient Details ?Name: Greg [name] ?MRN: [id number] ?DOB: November 29, 1931 ?Today's Date: 07/04/2021 ? ?History of Present Illness ? Pt is an 88 y.o. male admitted 07/03/21 with L foot osteomyelitis. 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 PMH includes dementia, Parkinson's disease, osteoporosis, R hip fx s/p ORIF (10/2020), PVD. ?  ?Clinical Impression ? Pt presents with an overall decrease in functional mobility secondary to above. PTA, pt resident at [facility]; was living in ALF portion with his wife until hip fx 10/2020. Initiated post-op BKA educ re: precautions, positioning, therex, limb guard wear, phantom limb pain and importance of mobility. Today, pt able to initiate scooting and standing EOB, requiring modA+2. Pt plans to return to room for rehab at [facility]; will follow acutely to maximize functional mobility and independence.     ? ?Recommendations for follow up therapy are one component of a multi-disciplinary discharge planning process, led by the attending physician.  Recommendations may be updated based on patient status, additional functional criteria and insurance authorization. ? ?Follow Up Recommendations Skilled nursing-short term rehab (<3 hours/day) ? ?  ?Assistance Recommended at Discharge Frequent or constant Supervision/Assistance  ?Patient can return home with the following ? Two people to help with walking and/or transfers;A lot of help with bathing/dressing/bathroom ? ?  ?Equipment Recommendations None recommended by PT  ?Recommendations for Other Services ?    ?  ?Functional Status Assessment Patient has had a recent decline in their functional status and demonstrates the ability to make significant improvements in function in a reasonable and predictable amount of time.  ? ?  ?Precautions / Restrictions Precautions ?Precautions: Fall;Other (comment) ?Precaution Comments: L BKA wound vac ?Restrictions ?Weight Bearing Restrictions: Yes ?LLE Weight Bearing: Non weight bearing   ? ?  ? ?Mobility ? Bed Mobility ?Overal bed mobility: Needs Assistance ?Bed Mobility: Supine to Sit, Sit to Supine ?  ?  ?Supine to sit: Min assist, HOB elevated ?Sit to supine: Min assist, HOB elevated ?  ?General bed mobility comments: heavy reliance on HOB elevated and use of bed rail; minA for trunk elevation, increased time and effort scooting hips and BLE management; minA for LE management return to supine ?  ? ?Transfers ?Overall transfer level: Needs assistance ?Equipment used:  (back of recliner) ?Transfers: Sit to/from Stand, Bed to chair/wheelchair/BSC ?Sit to Stand: Mod assist, +2 safety/equipment, From elevated surface ?  ?  ?  ?  ? Lateral/Scoot Transfers: Min assist ?General transfer comment: performed 3x sit<>stand from elevated EOB with BUEs pulling on rails on back of recliner, modA+1-2 for trunk elevation; pt with difficulty achieving fully upright due to R knee flexion and difficulty extending hips fully. pt able to perform lateral scoot transfer towards [facility] with minA for scooting hips ?  ? ?Ambulation/Gait ?  ?  ?  ?  ?  ?  ?  ?  ? ?Stairs ?  ?  ?  ?  ?  ? ?Wheelchair Mobility ?  ? ?Modified Rankin (Stroke Patients Only) ?  ? ?  ? ?Balance Overall balance assessment: Needs assistance ?Sitting-balance support: No upper extremity supported ?Sitting balance-Leahy Scale: Fair ?  ?  ?  ?Standing balance-Leahy Scale: Poor ?Standing balance comment: reliant on BUE support and external assist; dependent for pericare ?  ?  ?  ?  ?  ?  ?  ?  ?  ?  ?  ?   ? ? ? ?Pertinent Vitals/Pain Pain Assessment ?Pain Assessment: No/denies pain  ? ? ?Home  Living Family/patient expects to be discharged to:: Skilled nursing facility ?  ?  ?  ?  ?  ?  ?  ?  ?  ?Additional Comments: from [facility] SNF; pt resides in SNF since hip fx 10/2020, wife resides in ALF  ?  ?Prior Function Prior Level of Function : Needs assist ?  ?  ?  ?Physical Assist : Mobility (physical);ADLs (physical) ?  ?  ?Mobility Comments: pt requires  assist for w/c transfers - sometimes stand pivot with RW or scooting transfers; reports "I haven't gotten my balance back" ?ADLs Comments: assist from staff for ADL set-up, lower body dressing, bathing; use of grab bar in bathroom ?  ? ? ?Hand Dominance  ?   ? ?  ?Extremity/Trunk Assessment  ? Upper Extremity Assessment ?Upper Extremity Assessment: Overall WFL for tasks assessed ?  ? ?Lower Extremity Assessment ?Lower Extremity Assessment: RLE deficits/detail;LLE deficits/detail;Generalized weakness ?RLE Deficits / Details: reports h/o knee flexion contracture "for 80 years now"; hip strength functionally <3/5 strength ?LLE Deficits / Details: s/p L BKA; hip at least 3/5 strength; lacking full knee ROM ?  ? ?Cervical / Trunk Assessment ?Cervical / Trunk Assessment: Kyphotic  ?Communication  ? Communication: Other (comment) (slowed speech)  ?Cognition Arousal/Alertness: Awake/alert ?Behavior During Therapy: [facility] for tasks assessed/performed ?Overall Cognitive Status: History of cognitive impairments - at baseline ?  ?  ?  ?  ?  ?  ?  ?  ?  ?  ?  ?  ?  ?  ?  ?  ?General Comments: H/o dementia, parkinson's disease. pt demonstrates good awareness of medical history, although timeline not fully accurate ?  ?  ? ?  ?General Comments General comments (skin integrity, edema, etc.): initiated post-op BKA exercise - limb guard (doffed for skin check and knee flex/ext, redonned), phantom limb pain, desensitization, edema control, positioning, therex ? ?  ?Exercises Amputee Exercises ?Quad Sets: Left, Supine ?Hip ABduction/ADduction: Left, Supine ?Straight Leg Raises: Left, Supine  ? ?Assessment/Plan  ?  ?PT Assessment Patient needs continued PT services  ?PT Problem List Decreased strength;Decreased range of motion;Decreased activity tolerance;Decreased balance;Decreased mobility;Decreased cognition;Decreased knowledge of use of DME;Decreased knowledge of precautions ? ?   ?  ?PT Treatment Interventions DME instruction;Stair  training;Functional mobility training;Therapeutic activities;Wheelchair mobility training;Therapeutic exercise;Balance training;Patient/family education   ? ?PT Goals (Current goals can be found in the Care Plan section)  ?Acute Rehab PT Goals ?Patient Stated Goal: Return to SNF for rehab ?PT Goal Formulation: With patient ?Time For Goal Achievement: 07/18/21 ?Potential to Achieve Goals: Good ? ?  ?Frequency Min 3X/week ?  ? ? ?Co-evaluation PT/OT/SLP Co-Evaluation/Treatment: Yes ?Reason for Co-Treatment: Complexity of the patient's impairments (multi-system involvement);Necessary to address cognition/behavior during functional activity;For patient/therapist safety;To address functional/ADL transfers ?PT goals addressed during session: Mobility/safety with mobility;Balance ?  ?  ? ? ?  ?AM-PAC PT "6 Clicks" Mobility  ?Outcome Measure Help needed turning from your back to your side while in a flat bed without using bedrails?: A Lot ?Help needed moving from lying on your back to sitting on the side of a flat bed without using bedrails?: A Lot ?Help needed moving to and from a bed to a chair (including a wheelchair)?: Total ?Help needed standing up from a chair using your arms (e.g., wheelchair or bedside chair)?: Total ?Help needed to walk in hospital room?: Total ?Help needed climbing 3-5 steps with a railing? : Total ?6 Click Score: 8 ? ?  ?End of Session Equipment  Utilized During Treatment: Gait belt ?Activity Tolerance: Patient tolerated treatment well ?Patient left: in bed;with call bell/phone within reach;with bed alarm set ?Nurse Communication: Mobility status;Need for lift equipment ?PT Visit Diagnosis: Other abnormalities of gait and mobility (R26.89);Muscle weakness (generalized) (M62.81) ?  ? ?Time: 0821-0900 ?PT Time Calculation (min) (ACUTE ONLY): 39 min ? ? ?Charges:   PT Evaluation ?$PT Eval Moderate Complexity: 1 Mod ?  ?  ?Ina HomesJaclyn Hema Lanza, PT, DPT ?Acute Rehabilitation Services  ?Pager  [phone] ?Office [phone] ? ?Malachy ChamberJaclyn L Kelleigh Skerritt ?07/04/2021, 10:13 AM ? ?

## 2021-07-04 NOTE — Plan of Care (Signed)
?  Problem: Education: ?Goal: Knowledge of General Education information will improve ?Description: Including pain rating scale, medication(s)/side effects and non-pharmacologic comfort measures ?07/04/2021 1920 by Charma Igo, LPN ?Outcome: Progressing ?07/04/2021 1920 by Charma Igo, LPN ?Outcome: Progressing ?  ?Problem: Health Behavior/Discharge Planning: ?Goal: Ability to manage health-related needs will improve ?07/04/2021 1920 by Charma Igo, LPN ?Outcome: Progressing ?07/04/2021 1920 by Charma Igo, LPN ?Outcome: Progressing ?  ?Problem: Clinical Measurements: ?Goal: Ability to maintain clinical measurements within normal limits will improve ?07/04/2021 1920 by Charma Igo, LPN ?Outcome: Progressing ?07/04/2021 1920 by Charma Igo, LPN ?Outcome: Progressing ?Goal: Will remain free from infection ?07/04/2021 1920 by Charma Igo, LPN ?Outcome: Progressing ?07/04/2021 1920 by Charma Igo, LPN ?Outcome: Progressing ?Goal: Diagnostic test results will improve ?07/04/2021 1920 by Charma Igo, LPN ?Outcome: Progressing ?07/04/2021 1920 by Charma Igo, LPN ?Outcome: Progressing ?Goal: Respiratory complications will improve ?07/04/2021 1920 by Charma Igo, LPN ?Outcome: Progressing ?07/04/2021 1920 by Charma Igo, LPN ?Outcome: Progressing ?Goal: Cardiovascular complication will be avoided ?07/04/2021 1920 by Charma Igo, LPN ?Outcome: Progressing ?07/04/2021 1920 by Charma Igo, LPN ?Outcome: Progressing ?  ?Problem: Activity: ?Goal: Risk for activity intolerance will decrease ?07/04/2021 1920 by Charma Igo, LPN ?Outcome: Progressing ?07/04/2021 1920 by Charma Igo, LPN ?Outcome: Progressing ?  ?Problem: Nutrition: ?Goal: Adequate nutrition will be maintained ?07/04/2021 1920 by Charma Igo, LPN ?Outcome: Progressing ?07/04/2021 1920 by Charma Igo, LPN ?Outcome: Progressing ?  ?Problem: Coping: ?Goal: Level of anxiety will decrease ?07/04/2021  1920 by Charma Igo, LPN ?Outcome: Progressing ?07/04/2021 1920 by Charma Igo, LPN ?Outcome: Progressing ?  ?Problem: Elimination: ?Goal: Will not experience complications related to bowel motility ?07/04/2021 1920 by Charma Igo, LPN ?Outcome: Progressing ?07/04/2021 1920 by Charma Igo, LPN ?Outcome: Progressing ?Goal: Will not experience complications related to urinary retention ?07/04/2021 1920 by Charma Igo, LPN ?Outcome: Progressing ?07/04/2021 1920 by Charma Igo, LPN ?Outcome: Progressing ?  ?Problem: Pain Managment: ?Goal: General experience of comfort will improve ?07/04/2021 1920 by Charma Igo, LPN ?Outcome: Progressing ?07/04/2021 1920 by Charma Igo, LPN ?Outcome: Progressing ?  ?Problem: Safety: ?Goal: Ability to remain free from injury will improve ?07/04/2021 1920 by Charma Igo, LPN ?Outcome: Progressing ?07/04/2021 1920 by Charma Igo, LPN ?Outcome: Progressing ?  ?Problem: Skin Integrity: ?Goal: Risk for impaired skin integrity will decrease ?07/04/2021 1920 by Charma Igo, LPN ?Outcome: Progressing ?07/04/2021 1920 by Charma Igo, LPN ?Outcome: Progressing ?  ?

## 2021-07-04 NOTE — Progress Notes (Signed)
?  Inpatient Rehabilitation Admissions Coordinator  ? ?Rehab consult received. I await therapy evals to assist with planning dispo. ? ?Ottie Glazier, RN, MSN ?Rehab Admissions Coordinator ?[phone] ?07/04/2021 8:15 AM ? ?

## 2021-07-05 LAB — CBC
HCT: 25.6 % — ABNORMAL LOW (ref 39.0–52.0)
Hemoglobin: 8.3 g/dL — ABNORMAL LOW (ref 13.0–17.0)
MCH: 29.1 pg (ref 26.0–34.0)
MCHC: 32.4 g/dL (ref 30.0–36.0)
MCV: 89.8 fL (ref 80.0–100.0)
Platelets: 198 10*3/uL (ref 150–400)
RBC: 2.85 MIL/uL — ABNORMAL LOW (ref 4.22–5.81)
RDW: 15.9 % — ABNORMAL HIGH (ref 11.5–15.5)
WBC: 6.9 10*3/uL (ref 4.0–10.5)
nRBC: 0 % (ref 0.0–0.2)

## 2021-07-05 MED ORDER — OXYCODONE-ACETAMINOPHEN 5-325 MG PO TABS: 1.0000 | ORAL_TABLET | ORAL | 0 refills | Status: DC | PRN

## 2021-07-05 NOTE — Plan of Care (Signed)

## 2021-07-05 NOTE — Care Management Important Message (Signed)
Important Message ? ?Patient Details  ?Name: Greg [name] ?MRN: [id number] ?Date of Birth: 29-Nov-1931 ? ? ?Medicare Important Message Given:  Yes ? ? ? ? ?Marylene Land  Brent Taillon-Martin ?07/05/2021, 1:20 PM ?

## 2021-07-05 NOTE — Progress Notes (Signed)
Occupational Therapy Treatment ?Patient Details ?Name: Greg [name] ?MRN: [id number] ?DOB: Apr 02, 1932 ?Today's Date: 07/05/2021 ? ? ?History of present illness Pt is an 88 y.o. male admitted 07/03/21 with L foot osteomyelitis. 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 PMH includes dementia, Parkinson's disease, osteoporosis, R hip fx s/p ORIF (10/2020), PVD. ?  ?OT comments ? Pt readily willing with work with therapy. Focus of session on bed mobility, sitting balance at EOB during grooming and sit<>stands using stedy from elevated bed. Pt making steady progress and is highly motivated. Pt discharging back to [facility] today.   ? ?Recommendations for follow up therapy are one component of a multi-disciplinary discharge planning process, led by the attending physician.  Recommendations may be updated based on patient status, additional functional criteria and insurance authorization. ?   ?Follow Up Recommendations ? Skilled nursing-short term rehab (<3 hours/day)  ?  ?Assistance Recommended at Discharge Frequent or constant Supervision/Assistance  ?Patient can return home with the following ? A lot of help with bathing/dressing/bathroom;Two people to help with walking and/or transfers;Assistance with cooking/housework;Direct supervision/assist for medications management;Direct supervision/assist for financial management;Assist for transportation;Help with stairs or ramp for entrance ?  ?Equipment Recommendations ? None recommended by OT  ?  ?Recommendations for Other Services   ? ?  ?Precautions / Restrictions Precautions ?Precautions: Fall;Other (comment) ?Precaution Comments: L BKA wound vac ?Required Braces or Orthoses: Other Brace ?Other Brace: limb protector ?Restrictions ?Weight Bearing Restrictions: Yes ?LLE Weight Bearing: Non weight bearing  ? ? ?  ? ?Mobility Bed Mobility ?Overal bed mobility: Needs Assistance ?Bed Mobility: Supine to Sit, Sit to Supine ?  ?  ?Supine to sit: Mod assist, HOB elevated ?Sit to supine: Min assist,  HOB elevated ?  ?General bed mobility comments: heavy reliance on HOB elevated and use of bed rail; minA for trunk elevation, increased time and effort scooting hips and BLE management; modA to scoot hips to EOB, assist for R LE back into bed ?  ? ?Transfers ?Overall transfer level: Needs assistance ?Equipment used: Ambulation equipment used ?Transfers: Sit to/from Stand ?Sit to Stand: Min assist, Mod assist, +2 physical assistance, From elevated surface ?  ?  ?  ?  ?  ?General transfer comment: multiple sit<>stands from elevated bed and stedy seat into stedy frame with min-modA+1-2 for trunk elevation, pt relying heavily on pulling with BUEs on stedy bar, difficulty engaging RLE hip extension; frequent verbal cues for upright posture and hip ext ?  ?  ?Balance Overall balance assessment: Needs assistance ?Sitting-balance support: No upper extremity supported ?Sitting balance-Leahy Scale: Poor ?Sitting balance - Comments: posterior lean when involved in ADLs at EOB, fair statically ?Postural control: Posterior lean ?  ?Standing balance-Leahy Scale: Poor ?  ?  ?  ?  ?  ?  ?  ?  ?  ?  ?  ?  ?   ? ?ADL either performed or assessed with clinical judgement  ? ?ADL Overall ADL's : Needs assistance/impaired ?  ?  ?Grooming: Oral care;Wash/dry hands;Wash/dry face;Sitting;Minimal assistance ?Grooming Details (indicate cue type and reason): assist for sitting balance at EOB ?Upper Body Bathing: Maximal assistance;Sitting ?  ?  ?  ?  ?  ?  ?  ?  ?  ?  ?  ?  ?  ?  ?  ?  ? ?Extremity/Trunk Assessment   ?  ?  ?  ?  ?  ? ?Vision   ?  ?  ?Perception   ?  ?Praxis   ?  ? ?  Cognition Arousal/Alertness: Awake/alert ?Behavior During Therapy: Flat affect ?Overall Cognitive Status: History of cognitive impairments - at baseline ?  ?  ?  ?  ?  ?  ?  ?  ?  ?  ?  ?  ?  ?  ?  ?  ?General Comments: H/o dementia, parkinson's disease. pt motivated to participate, following majority of simple commands with increased time ?  ?  ?   ?Exercises   ? ?   ?Shoulder Instructions   ? ? ?  ?General Comments limb guard removed for skin check and L knee ROM; replaced  ? ? ?Pertinent Vitals/ Pain       Pain Assessment ?Pain Assessment: Faces ?Faces Pain Scale: Hurts a little bit ?Pain Location: L calf area ?Pain Descriptors / Indicators: Sharp ?Pain Intervention(s): Monitored during session, Repositioned ? ?Home Living   ?  ?  ?  ?  ?  ?  ?  ?  ?  ?  ?  ?  ?  ?  ?  ?  ?  ?  ? ?  ?Prior Functioning/Environment    ?  ?  ?  ?   ? ?Frequency ? Min 2X/week  ? ? ? ? ?  ?Progress Toward Goals ? ?OT Goals(current goals can now be found in the care plan section) ? Progress towards OT goals: Progressing toward goals ? ?Acute Rehab OT Goals ?OT Goal Formulation: With patient ?Time For Goal Achievement: 07/18/21 ?Potential to Achieve Goals: Good  ?Plan Discharge plan remains appropriate   ? ?Co-evaluation ? ? ? PT/OT/SLP Co-Evaluation/Treatment: Yes ?Reason for Co-Treatment: For patient/therapist safety ?  ?OT goals addressed during session: ADL's and self-care ?  ? ?  ?AM-PAC OT "6 Clicks" Daily Activity     ?Outcome Measure ? ? Help from another person eating meals?: None ?Help from another person taking care of personal grooming?: A Little ?Help from another person toileting, which includes using toliet, bedpan, or urinal?: Total ?Help from another person bathing (including washing, rinsing, drying)?: A Lot ?Help from another person to put on and taking off regular upper body clothing?: A Little ?Help from another person to put on and taking off regular lower body clothing?: Total ?6 Click Score: 14 ? ?  ?End of Session Equipment Utilized During Treatment: Gait belt ? ?OT Visit Diagnosis: Unsteadiness on feet (R26.81);Other symptoms and signs involving cognitive function;Muscle weakness (generalized) (M62.81) ?  ?Activity Tolerance Patient tolerated treatment well ?  ?Patient Left in bed;with call bell/phone within reach;with bed alarm set ?  ?Nurse Communication   ?  ? ?    ? ?Time: 4431-5400 ?OT Time Calculation (min): 16 min ? ?Charges: OT General Charges ?$OT Visit: 1 Visit ?OT Treatments ?$Self Care/Home Management : 8-22 mins ? ?Martie Round, OTR/L ?Acute Rehabilitation Services ?Pager: [phone] ?Office: [phone] ? ?Evern Bio ?07/05/2021, 12:49 PM ?

## 2021-07-05 NOTE — Discharge Summary (Signed)
?Discharge Diagnoses:  ?Principal Problem: ?  Below-knee amputation of left lower extremity (HCC) ? ? ?Surgeries: Procedure(s): ?LEFT BELOW KNEE AMPUTATION on 07/03/2021  ?  ?Consultants:  ? ?Discharged Condition: Improved ? ?Hospital Course: Greg [name] is an 88 y.o. male who was admitted 07/03/2021 with a chief complaint of osteomyelitis left foot, with a final diagnosis of Osteomyelitis Left Foot.  Patient was brought to the operating room on 07/03/2021 and underwent Procedure(s): ?LEFT BELOW KNEE AMPUTATION.   ? ?Patient was given perioperative antibiotics:  ?Anti-infectives (From admission, onward)  ? ? Start     Dose/Rate Route Frequency Ordered Stop  ? 07/03/21 1700  ceFAZolin (ANCEF) IVPB 2g/100 mL premix       ? 2 g ?200 mL/hr over 30 Minutes Intravenous Every 8 hours 07/03/21 1556 07/03/21 2246  ? 07/03/21 0830  ceFAZolin (ANCEF) IVPB 2g/100 mL premix       ? 2 g ?200 mL/hr over 30 Minutes Intravenous On call to O.R. 07/03/21 3545 07/03/21 1118  ? ?  ?. ? ?Patient was given sequential compression devices, early ambulation, and aspirin for DVT prophylaxis. ? ?Recent vital signs: Patient Vitals for the past 24 hrs: ? BP Temp Temp src Pulse Resp SpO2  ?07/05/21 0517 126/69 -- -- 71 16 96 %  ?07/04/21 2032 118/62 99 ?F (37.2 ?C) Oral 80 15 96 %  ?07/04/21 1914 -- -- -- 80 -- 95 %  ?07/04/21 1602 (!) 101/55 98.3 ?F (36.8 ?C) Oral 82 14 97 %  ?07/04/21 0736 (!) 97/45 97.6 ?F (36.4 ?C) Oral 67 14 96 %  ?. ? ?Recent laboratory studies: No results found. ? ?Discharge Medications:   ?Allergies as of 07/05/2021   ?No Known Allergies ?  ? ?  ?Medication List  ?  ? ?STOP taking these medications   ? ?collagenase 250 UNIT/GM ointment ?Commonly known as: SANTYL ?  ?sulfamethoxazole-trimethoprim 400-80 MG tablet ?Commonly known as: BACTRIM ?  ? ?  ? ?TAKE these medications   ? ?acetaminophen 500 MG tablet ?Commonly known as: TYLENOL ?Take 1,000 mg by mouth every other day. Prior to L heel dressing changes. ?   ?acetaminophen 325 MG tablet ?Commonly known as: TYLENOL ?Take 650 mg by mouth every 6 (six) hours as needed (Pain score 1-3 or temp>100.5.). ?  ?albuterol 108 (90 Base) MCG/ACT inhaler ?Commonly known as: VENTOLIN HFA ?Inhale 2 puffs into the lungs every 4 (four) hours as needed for wheezing or shortness of breath. ?  ?apixaban 5 MG Tabs tablet ?Commonly known as: ELIQUIS ?Take 5 mg by mouth 2 (two) times daily. ?  ?calcium carbonate 1500 (600 Ca) MG Tabs tablet ?Commonly known as: OSCAL ?Take 600 mg of elemental calcium by mouth daily with breakfast. ?  ?carbidopa-levodopa 25-100 MG tablet ?Commonly known as: SINEMET IR ?Take 1 tablet by mouth 4 (four) times daily. ?  ?carbidopa-levodopa 50-200 MG tablet ?Commonly known as: SINEMET CR ?Take 1 tablet by mouth at bedtime. ?  ?feeding supplement (PRO-STAT SUGAR FREE 64) Liqd ?Take 30 mLs by mouth in the morning and at bedtime. ?  ?ferrous sulfate 325 (65 FE) MG tablet ?Take 325 mg by mouth every Monday, Wednesday, and Friday. ?  ?oxyCODONE-acetaminophen 5-325 MG tablet ?Commonly known as: PERCOCET/ROXICET ?Take 1 tablet by mouth every 4 (four) hours as needed. ?  ?polyethylene glycol 17 g packet ?Commonly known as: MIRALAX / GLYCOLAX ?Take 17 g by mouth every other day. ?  ?senna 8.6 MG Tabs tablet ?Commonly known as: SENOKOT ?Take  1 tablet (8.6 mg total) by mouth daily. ?What changed: how much to take ?  ?torsemide 20 MG tablet ?Commonly known as: DEMADEX ?Take 20-40 mg by mouth See admin instructions. Take 20mg  on Sun, Mon, Wed, Fri, Sat. Take 40mg  on Tue, Thu. ?  ?vitamin B-12 1000 MCG tablet ?Commonly known as: CYANOCOBALAMIN ?Take 1,000 mcg by mouth daily. ?  ?Vitamin D (Cholecalciferol) 25 MCG (1000 UT) Tabs ?Take 1,000 Units by mouth daily. ?  ?zinc oxide 20 % ointment ?Apply 1 application. topically See admin instructions. To buttocks after every incontinent episode and as needed for redness. ?  ? ?  ? ? ?Diagnostic Studies: PERIPHERAL VASCULAR  CATHETERIZATION ? ?Result Date: 06/12/2021 ?1.  No significant aortoiliac disease. 2.  Left lower extremity: Heavily calcified vessels throughout with no obstructive disease affecting the SFA or popliteal arteries.  Heavily calcified disease causing severe stenosis in the proximal and mid posterior tibial artery but with collaterals.  Patent anterior tibial and peroneal arteries. Recommendations: The patient has good inline flow throughout.  Revascularization of the proximal portion of posterior tibial artery will not offer much additional blood flow and will be associated with significant risk of distal embolization considering degree of calcifications. Continue wound care.  No indication for revascularization.  ? ?XR Foot 2 Views Left ? ?Result Date: 07/01/2021 ?2 view radiographs of the left foot shows extensive soft tissue defect over the calcaneus posteriorly as well as severe peripheral vascular disease with calcification of the arteries down through the foot.  ? ?Patient benefited maximally from their hospital stay and there were no complications.   ? ? ?Disposition: Discharge disposition: 62-Rehab Facility ? ? ? ? ? ?Discharge Instructions   ? ? Call MD / Call 911   Complete by: As directed ?  ? If you experience chest pain or shortness of breath, CALL 911 and be transported to the hospital emergency room.  If you develope a fever above 101 F, pus (white drainage) or increased drainage or redness at the wound, or calf pain, call your surgeon's office.  ? Constipation Prevention   Complete by: As directed ?  ? Drink plenty of fluids.  Prune juice may be helpful.  You may use a stool softener, such as Colace (over the counter) 100 mg twice a day.  Use MiraLax (over the counter) for constipation as needed.  ? Diet - low sodium heart healthy   Complete by: As directed ?  ? Increase activity slowly as tolerated   Complete by: As directed ?  ? Negative Pressure Wound Therapy - Incisional   Complete by: As directed ?   ? Attach vac dressing to prevena portable pump  ? Post-operative opioid taper instructions:   Complete by: As directed ?  ? POST-OPERATIVE OPIOID TAPER INSTRUCTIONS: ?It is important to wean off of your opioid medication as soon as possible. If you do not need pain medication after your surgery it is ok to stop day one. ?Opioids include: ?Codeine, Hydrocodone(Norco, Vicodin), Oxycodone(Percocet, oxycontin) and hydromorphone amongst others.  ?Long term and even short term use of opiods can cause: ?Increased pain response ?Dependence ?Constipation ?Depression ?Respiratory depression ?And more.  ?Withdrawal symptoms can include ?Flu like symptoms ?Nausea, vomiting ?And more ?Techniques to manage these symptoms ?Hydrate well ?Eat regular healthy meals ?Stay active ?Use relaxation techniques(deep breathing, meditating, yoga) ?Do Not substitute Alcohol to help with tapering ?If you have been on opioids for less than two weeks and do not have pain than it is  ok to stop all together.  ?Plan to wean off of opioids ?This plan should start within one week post op of your joint replacement. ?Maintain the same interval or time between taking each dose and first decrease the dose.  ?Cut the total daily intake of opioids by one tablet each day ?Next start to increase the time between doses. ?The last dose that should be eliminated is the evening dose.  ? ?  ? ?  ? ? Follow-up Information   ? ? Nadara Mustard, MD Follow up in 1 week(s).   ?Specialty: Orthopedic Surgery ?Contact information: ?[address] ?[place] [address] ?[phone] ? ? ?  ?  ? ?  ?  ? ?  ? ? ? ?Signed: ?Nadara Mustard ?07/05/2021, 7:27 AM ? ?

## 2021-07-05 NOTE — Progress Notes (Signed)
Physical Therapy Treatment ?Patient Details ?Name: Greg [name] ?MRN: [id number] ?DOB: 23-May-1931 ?Today's Date: 07/05/2021 ? ? ?History of Present Illness Pt is an 88 y.o. male admitted 07/03/21 with L foot osteomyelitis. 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 PMH includes dementia, Parkinson's disease, osteoporosis, R hip fx s/p ORIF (10/2020), PVD. ?  ?PT Comments  ? ? Pt progressing with mobility. Today's session focused on L knee ROM and standing trials in [place] for strengthening; pt motivated to participate. Pt remains limited by generalized weakness, decreased activity tolerance, and impaired balance strategies/postural reactions. Continue to recommend SNF-level therapies to maximize functional mobility and independence prior to return home. ?   ?Recommendations for follow up therapy are one component of a multi-disciplinary discharge planning process, led by the attending physician.  Recommendations may be updated based on patient status, additional functional criteria and insurance authorization. ? ?Follow Up Recommendations ? Skilled nursing-short term rehab (<3 hours/day) ?  ?  ?Assistance Recommended at Discharge Frequent or constant Supervision/Assistance  ?Patient can return home with the following Two people to help with walking and/or transfers;A lot of help with bathing/dressing/bathroom ?  ?Equipment Recommendations ? None recommended by PT  ?  ?Recommendations for Other Services   ? ? ?  ?Precautions / Restrictions Precautions ?Precautions: Fall;Other (comment) ?Precaution Comments: L BKA wound vac ?Required Braces or Orthoses: Other Brace ?Other Brace: limb protector ?Restrictions ?Weight Bearing Restrictions: Yes ?LLE Weight Bearing: Non weight bearing  ?  ? ?Mobility ? Bed Mobility ?Overal bed mobility: Needs Assistance ?Bed Mobility: Supine to Sit ?  ?  ?Supine to sit: Mod assist, HOB elevated ?  ?  ?General bed mobility comments: heavy reliance on HOB elevated and use of bed rail; minA for trunk elevation, increased  time and effort scooting hips and BLE management; modA to scoot hips to EOB ?  ? ?Transfers ?Overall transfer level: Needs assistance ?Equipment used: Ambulation equipment used (stedy) ?Transfers: Sit to/from Stand ?Sit to Stand: Min assist, Mod assist, +2 physical assistance, From elevated surface ?  ?  ?  ?  ?  ?General transfer comment: multiple sit<>stands from elevated bed and stedy seat into stedy frame with min-modA+1-2 for trunk elevation, pt relying heavily on pulling with BUEs on stedy bar, difficulty engaging RLE hip extension; frequent verbal cues for upright posture and hip ext ?  ? ?Ambulation/Gait ?  ?  ?  ?  ?  ?  ?  ?  ? ? ?Stairs ?  ?  ?  ?  ?  ? ? ?Wheelchair Mobility ?  ? ?Modified Rankin (Stroke Patients Only) ?  ? ? ?  ?Balance Overall balance assessment: Needs assistance ?Sitting-balance support: No upper extremity supported ?Sitting balance-Leahy Scale: Fair ?  ?  ?  ?Standing balance-Leahy Scale: Poor ?Standing balance comment: reliant on BUE support and external assist ?  ?  ?  ?  ?  ?  ?  ?  ?  ?  ?  ?  ? ?  ?Cognition Arousal/Alertness: Awake/alert ?Behavior During Therapy: [facility] for tasks assessed/performed ?Overall Cognitive Status: History of cognitive impairments - at baseline ?  ?  ?  ?  ?  ?  ?  ?  ?  ?  ?  ?  ?  ?  ?  ?  ?General Comments: H/o dementia, parkinson's disease. pt motivated to participate, following majority of simple commands with increased time ?  ?  ? ?  ?Exercises Amputee Exercises ?Quad Sets: Left, Supine ?Straight Leg Raises: Left,  Supine ? ?  ?General Comments General comments (skin integrity, edema, etc.): limb guard removed for skin check and L knee ROM; replaced ?  ?  ? ?Pertinent Vitals/Pain Pain Assessment ?Pain Assessment: Faces ?Faces Pain Scale: Hurts a little bit ?Pain Location: L calf area ?Pain Descriptors / Indicators: Sharp ?Pain Intervention(s): Monitored during session, Repositioned  ? ? ?Home Living   ?  ?  ?  ?  ?  ?  ?  ?  ?  ?   ?  ?Prior  Function    ?  ?  ?   ? ?PT Goals (current goals can now be found in the care plan section) Progress towards PT goals: Progressing toward goals ? ?  ?Frequency ? ? ? Min 3X/week ? ? ? ?  ?PT Plan Current plan remains appropriate  ? ? ?Co-evaluation   ?  ?  ?  ?  ? ?  ?AM-PAC PT "6 Clicks" Mobility   ?Outcome Measure ? Help needed turning from your back to your side while in a flat bed without using bedrails?: A Lot ?Help needed moving from lying on your back to sitting on the side of a flat bed without using bedrails?: A Lot ?Help needed moving to and from a bed to a chair (including a wheelchair)?: Total ?Help needed standing up from a chair using your arms (e.g., wheelchair or bedside chair)?: Total ?Help needed to walk in hospital room?: Total ?Help needed climbing 3-5 steps with a railing? : Total ?6 Click Score: 8 ? ?  ?End of Session Equipment Utilized During Treatment: Gait belt ?Activity Tolerance: Patient tolerated treatment well ?Patient left: in bed;with call bell/phone within reach;with bed alarm set ?Nurse Communication: Mobility status;Need for lift equipment ?PT Visit Diagnosis: Other abnormalities of gait and mobility (R26.89);Muscle weakness (generalized) (M62.81) ?  ? ? ?Time: 5374-8270 ?PT Time Calculation (min) (ACUTE ONLY): 17 min ? ?Charges:  $Therapeutic Exercise: 8-22 mins          ?          ?Ina Homes, PT, DPT ?Acute Rehabilitation Services  ?Pager [phone] ?Office [phone] ? ?Malachy Chamber ?07/05/2021, 12:07 PM ? ?

## 2021-07-05 NOTE — TOC Transition Note (Signed)
Transition of Care (TOC) - CM/SW Discharge Note ? ? ?Patient Details  ?Name: Greg [name] ?MRN: [id number] ?Date of Birth: [birth year] ? ?Transition of Care [facility]) CM/SW Contact:  ?Lorri Frederick, LCSW ?Phone Number: ?07/05/2021, 10:22 AM ? ? ?Clinical Narrative:   Pt discharging to [facility]. RN call [phone] for report.   ? ? ? ?Final next level of care: Skilled Nursing Facility ?Barriers to Discharge: Barriers Resolved ? ? ?Patient Goals and CMS Choice ?  ?CMS Medicare.gov Compare Post Acute Care list provided to::  (pt is from [facility], wants to return) ?  ? ?Discharge Placement ?  ?           ?Patient chooses bed at:  [facility]) ?Patient to be transferred to facility by: [facility] ?Name of family member notified: wife Erskine Squibb ?Patient and family notified of of transfer: 07/05/21 ? ?Discharge Plan and Services ?In-house Referral: Clinical Social Work ?  ?Post Acute Care Choice: Skilled Nursing Facility          ?  ?  ?  ?  ?  ?  ?  ?  ?  ?  ? ?Social Determinants of Health (SDOH) Interventions ?  ? ? ?Readmission Risk Interventions ?   ? View : No data to display.  ?  ?  ?  ? ? ? ? ? ?

## 2021-07-05 NOTE — [place] FL2 (Signed)
?[place] MEDICAID FL2 LEVEL OF CARE SCREENING TOOL  ?  ? ?IDENTIFICATION  ?Patient Name: ?Greg [name] Birthdate: 10-03-1931 Sex: male Admission Date (Current Location): ?07/03/2021  ?[place] and [place] Number: ? [place] ?  Facility and Address:  ?[facility], [place] [address], [place], [place] [address] ?     Provider Number: ?[id number]  ?Attending Physician Name and Address:  ?Newt Minion, MD ? Relative Name and Phone Number:  ?Downum,jane Spouse [phone]  [phone] ?   ?Current Level of Care: ?Hospital Recommended Level of Care: ?[place] Prior Approval Number: ?  ? ?Date Approved/Denied: ?  PASRR Number: ?[id number] ? ?Discharge Plan: ?SNF ?  ? ?Current Diagnoses: ?Patient Active Problem List  ? Diagnosis Date Noted  ? Below-knee amputation of left lower extremity ([place]) 07/03/2021  ? Vitamin B12 deficiency 03/05/2021  ? Candida rash of groin 03/05/2021  ? (HFpEF) heart failure with preserved ejection fraction ([place]) 02/04/2021  ? Acute osteomyelitis of left calcaneus ([place]) 01/11/2021  ? MRSA infection 01/11/2021  ? CKD (chronic kidney disease) stage 3, GFR 30-59 ml/min (HCC) 01/08/2021  ? DVT of deep femoral vein, right ([place]) 12/22/2020  ? Pressure ulcer, unstageable ([place]) 11/12/2020  ? Hyponatremia 10/15/2020  ? Prediabetes 10/15/2020  ? HTN (hypertension) 10/15/2020  ? Slow transit constipation 10/15/2020  ? Blood loss anemia 10/15/2020  ? Closed displaced fracture of right acetabulum ([place])   ? Closed right hip fracture ([place]) 10/08/2020  ? Parkinson's disease ([place]) 09/25/2020  ? BPH (benign prostatic hyperplasia) 09/25/2020  ? Edema 09/25/2020  ? Osteoarthritis, multiple sites 09/25/2020  ? Osteoporosis 09/25/2020  ? Hyperlipidemia 09/25/2020  ? ? ?Orientation RESPIRATION BLADDER Height & Weight   ?  ?Self, Time, Situation, Place ? Normal Incontinent Weight: 187 lb (84.8 kg) ?Height:  5\' 10"  (177.8 cm)  ?BEHAVIORAL SYMPTOMS/MOOD NEUROLOGICAL BOWEL  NUTRITION STATUS  ?    Continent Diet (see discharge summary)  ?AMBULATORY STATUS COMMUNICATION OF NEEDS Skin   ?Total Care Verbally Surgical wounds ?  ?  ?  ?    ?     ?     ? ? ?Personal Care Assistance Level of Assistance  ?Bathing, Feeding, Dressing, Total care Bathing Assistance: Maximum assistance ?Feeding assistance: Independent ?Dressing Assistance: Maximum assistance ?Total Care Assistance: Maximum assistance  ? ?Functional Limitations Info  ?Sight, Hearing, Speech Sight Info: Adequate ?Hearing Info: Adequate ?Speech Info: Adequate  ? ? ?SPECIAL CARE FACTORS FREQUENCY  ?PT (By licensed PT), OT (By licensed OT)   ?  ?PT Frequency: evaluate and treat ?OT Frequency: evaluate and treat ?  ?  ?  ?   ? ? ?Contractures Contractures Info: Not present  ? ? ?Additional Factors Info  ?Code Status, Allergies Code Status Info: full ?Allergies Info: NKA ?  ?  ?  ?   ? ?Current Medications (07/05/2021):  This is the current hospital active medication list ?Current Facility-Administered Medications  ?Medication Dose Route Frequency Provider Last Rate Last Admin  ? 0.9 %  sodium chloride infusion   Intravenous Continuous Newt Minion, MD   Stopped at 07/04/21 [phone]  ? acetaminophen (TYLENOL) tablet 325-650 mg  325-650 mg Oral Q6H PRN Newt Minion, MD      ? albuterol (PROVENTIL) (2.5 MG/3ML) 0.083% nebulizer solution 3 mL  3 mL Inhalation Q4H PRN Newt Minion, MD      ? alum & mag hydroxide-simeth (MAALOX/MYLANTA) 200-200-20 MG/5ML suspension 15-30 mL  15-30 mL Oral Q2H PRN Sharol Given,  Illene Regulus, MD      ? apixaban [facility]) tablet 5 mg  5 mg Oral BID Newt Minion, MD   5 mg at 07/04/21 2107  ? ascorbic acid (VITAMIN C) tablet 1,000 mg  1,000 mg Oral Daily Newt Minion, MD   1,000 mg at 07/04/21 [id number]  ? bisacodyl (DULCOLAX) EC tablet 5 mg  5 mg Oral Daily PRN Newt Minion, MD      ? carbidopa-levodopa (SINEMET CR) 50-200 MG per tablet controlled release 1 tablet  1 tablet Oral QHS Newt Minion, MD   1 tablet at  07/04/21 2107  ? carbidopa-levodopa (SINEMET IR) 25-100 MG per tablet immediate release 1 tablet  1 tablet Oral QID Newt Minion, MD   1 tablet at 07/05/21 [phone]  ? Chlorhexidine Gluconate Cloth 2 % PADS 6 each  6 each Topical Daily Newt Minion, MD   6 each at 07/04/21 [phone]  ? docusate sodium (COLACE) capsule 100 mg  100 mg Oral Daily Newt Minion, MD   100 mg at 07/04/21 [id number]  ? fentaNYL (SUBLIMAZE) injection 50 mcg  50 mcg Intravenous Once Newt Minion, MD      ? guaiFENesin-dextromethorphan [facility] DM) 100-10 MG/5ML syrup 15 mL  15 mL Oral Q4H PRN Newt Minion, MD      ? hydrALAZINE (APRESOLINE) injection 5 mg  5 mg Intravenous Q20 Min PRN Newt Minion, MD      ? HYDROmorphone (DILAUDID) injection 0.5-1 mg  0.5-1 mg Intravenous Q4H PRN Newt Minion, MD      ? labetalol (NORMODYNE) injection 10 mg  10 mg Intravenous Q10 min PRN Newt Minion, MD      ? magnesium sulfate IVPB 2 g 50 mL  2 g Intravenous Daily PRN Newt Minion, MD      ? metoprolol tartrate (LOPRESSOR) injection 2-5 mg  2-5 mg Intravenous Q2H PRN Newt Minion, MD      ? nutrition supplement (JUVEN) (JUVEN) powder packet 1 packet  1 packet Oral BID BM Newt Minion, MD   1 packet at 07/04/21 1555  ? ondansetron (ZOFRAN) injection 4 mg  4 mg Intravenous Q6H PRN Newt Minion, MD      ? oxyCODONE (Oxy IR/ROXICODONE) immediate release tablet 10-15 mg  10-15 mg Oral Q4H PRN Newt Minion, MD   15 mg at 07/04/21 2107  ? oxyCODONE (Oxy IR/ROXICODONE) immediate release tablet 5-10 mg  5-10 mg Oral Q4H PRN Newt Minion, MD   10 mg at 07/04/21 [id number]  ? pantoprazole (PROTONIX) EC tablet 40 mg  40 mg Oral Daily Newt Minion, MD   40 mg at 07/04/21 [id number]  ? phenol (CHLORASEPTIC) mouth spray 1 spray  1 spray Mouth/Throat PRN Newt Minion, MD      ? polyethylene glycol (MIRALAX / GLYCOLAX) packet 17 g  17 g Oral Daily PRN Newt Minion, MD      ? potassium chloride SA (KLOR-CON M) CR tablet 20-40 mEq  20-40 mEq Oral Daily PRN Newt Minion, MD      ? sodium phosphate (FLEET) 7-19 GM/118ML enema 1 enema  1 enema Rectal Once PRN Newt Minion, MD      ? torsemide [facility]) tablet 20 mg  20 mg Oral Once per day on Sun Mon Wed Fri Sat Newt Minion, MD      ? torsemide [facility]) tablet 40 mg  40 mg Oral  Once per day on Tue Thu Duda, Marcus V, MD   40 mg at 07/04/21 [id number]  ? zinc sulfate capsule 220 mg  220 mg Oral Daily Newt Minion, MD   220 mg at 07/04/21 [id number]  ? ? ? ?Discharge Medications: ?Please see discharge summary for a list of discharge medications. ? ?Relevant Imaging Results: ? ?Relevant Lab Results: ? ? ?Additional Information ?SSN [id number] Pt is covid vaccinated x4 ? ?Joanne Chars, LCSW ? ? ? ? ?

## 2021-07-08 ENCOUNTER — Telehealth: Payer: Self-pay | Admitting: *Deleted

## 2021-07-08 ENCOUNTER — Non-Acute Institutional Stay (SKILLED_NURSING_FACILITY): Payer: Medicare Other | Admitting: Orthopedic Surgery

## 2021-07-08 ENCOUNTER — Encounter: Payer: Self-pay | Admitting: Orthopedic Surgery

## 2021-07-08 DIAGNOSIS — M810 Age-related osteoporosis without current pathological fracture: Secondary | ICD-10-CM

## 2021-07-08 DIAGNOSIS — Z89512 Acquired absence of left leg below knee: Secondary | ICD-10-CM

## 2021-07-08 DIAGNOSIS — R600 Localized edema: Secondary | ICD-10-CM | POA: Diagnosis not present

## 2021-07-08 DIAGNOSIS — I82411 Acute embolism and thrombosis of right femoral vein: Secondary | ICD-10-CM

## 2021-07-08 DIAGNOSIS — I10 Essential (primary) hypertension: Secondary | ICD-10-CM

## 2021-07-08 DIAGNOSIS — N1832 Chronic kidney disease, stage 3b: Secondary | ICD-10-CM

## 2021-07-08 DIAGNOSIS — E538 Deficiency of other specified B group vitamins: Secondary | ICD-10-CM

## 2021-07-08 DIAGNOSIS — I739 Peripheral vascular disease, unspecified: Secondary | ICD-10-CM | POA: Diagnosis not present

## 2021-07-08 DIAGNOSIS — K5903 Drug induced constipation: Secondary | ICD-10-CM

## 2021-07-08 DIAGNOSIS — G20 Parkinson's disease: Secondary | ICD-10-CM

## 2021-07-08 DIAGNOSIS — D500 Iron deficiency anemia secondary to blood loss (chronic): Secondary | ICD-10-CM

## 2021-07-08 DIAGNOSIS — R7303 Prediabetes: Secondary | ICD-10-CM

## 2021-07-08 DIAGNOSIS — S61412A Laceration without foreign body of left hand, initial encounter: Secondary | ICD-10-CM

## 2021-07-08 LAB — HEPATIC FUNCTION PANEL
ALT: 3 U/L — AB (ref 10–40)
AST: 12 — AB (ref 14–40)
Alkaline Phosphatase: 45 (ref 25–125)
Bilirubin, Total: 0.4

## 2021-07-08 LAB — COMPREHENSIVE METABOLIC PANEL
Albumin: 3.2 — AB (ref 3.5–5.0)
Calcium: 8.5 — AB (ref 8.7–10.7)
Globulin: 2.6

## 2021-07-08 LAB — BASIC METABOLIC PANEL
BUN: 35 — AB (ref 4–21)
CO2: 29 — AB (ref 13–22)
Chloride: 102 (ref 99–108)
Creatinine: 1.1 (ref 0.6–1.3)
Glucose: 91
Potassium: 4.2 mEq/L (ref 3.5–5.1)
Sodium: 139 (ref 137–147)

## 2021-07-08 LAB — SURGICAL PATHOLOGY

## 2021-07-08 NOTE — Telephone Encounter (Signed)
Transition Care Management Unsuccessful Follow-up Telephone Call ? ?Date of discharge and from where:  07/05/2021  ? ?Attempts:  1st Attempt ? ?Reason for unsuccessful TCM follow-up call:  Unable to reach patient [facility] SNF ? ?  ?

## 2021-07-08 NOTE — Progress Notes (Signed)
?Location:  [facility] ?Nursing Home Room Number: N27/A ?Place of Service:  SNF (31) ?Provider: Octavia Heir, NP ? ?Patient Care Team: ?[name], MD as PCP - General (Internal Medicine) ?Vladimir Faster, DO as Consulting Physician (Neurology) ? ?Extended Emergency Contact Information ?Primary Emergency Contact: Ferre,jane ?Address: [address] ?         [address] ? [address] [place] ?Home Phone: [phone] ?Mobile Phone: [phone] ?Relation: Spouse ?Secondary Emergency Contact: Steffenhagen,kenneth ?Home Phone: [phone] ?Mobile Phone: [phone] ?Relation: Son ? ?Code Status:  Full code ?Goals of care: Advanced Directive information ? ?  07/08/2021  ?  9:18 AM  ?Advanced Directives  ?Does Patient Have a Medical Advance Directive? Yes  ?Type of Advance Directive Living will  ?Does patient want to make changes to medical advance directive? No - Patient declined  ? ? ? ?Chief Complaint  ?Patient presents with  ? Hospitalization Follow-up  ?  Hospital follow-up. Discuss the need for Shingrix vaccine, or post pone if patient refuses.   ? ? ?HPI:  ?Pt is a 88 y.o. male seen today for f/u s/p hospitalization 03/29-03/31.  ? ?03/29 he underwent left below knee amputation by Dr. Lajoyce Corners due to ongoing left foot osteomyelitis. Tolerated procedure well. Prevena wound vac placed over surgical incision x 1 week. He was discharged back to skilled nursing at [facility] for PT/OT. Today, he denies left leg pain. No recent bowel movement. Eliquis restarted for DVT prophylaxis. Prevena with minimal serosanguinous drainage. 1 person assist for transfers. Afebrile. Overall, he is relieved he had the procedure and looks forward to working with PT/OT. F/u with Dr. Lajoyce Corners 07/11/2021.  ? ?PAD-  underwent abdominal aortogram of lower extremity 03/08 by Dr. Keturah Barre- no significant aortoiliac disease/ LLE heavily calcified (proximal and mid posterior tibial artery) with no obstructive disease affecting SFA  or popliteal arteries, no indication for revascularization ?BLE- LV EF 60-65% 02/20/2021, weight has fluctuated in past 2 months- unable to tolerate aggressive diuresis, pitting edema BLE L>R, remains on torsemide daily ?DVT of deep femoral vein- diagnosed 12/2020, remains on Eliquis ?Parkinson's-  diagnosed 2019, followed by neurology, remains on sinemet ?HTN- BUN/creat 34/1.49 07/03/2021, hypotension last month- improved, off carvedilol at this time ?CKD 3b- see above ?Prediabetes- - A1c 5.8 07/03/2021 ?Anemia- hgb 8.3 07/05/2021 , remains on ferrous sulfate M/W/F ?Vitamin B12 deficiency- B12 205 03/04/2021, remains on cyanocobalamin daily ?Osteoporosis- ORIF right hip 10/10/2020, stopped fosamax 2018 (6 years use), remains on calcium and vitamin D supplements ?Constipation- LBM 03/28, remains on miralax QOD and senna ? ? ?Past Medical History:  ?Diagnosis Date  ? Acute bronchiolitis   ? Acute osteomyelitis of calcaneum, left (HCC) 01/11/2021  ? Anemia   ? Bursitis of both hips   ? Complication of anesthesia   ? wife states pt was a little "loopy" for several days  ? Dementia (HCC)   ? due to Parkinson's  ? Hearing reduced   ? hearing aid  ? History of basal cell carcinoma   ? Hyperlipidemia   ? Malaise and fatigue   ? MRSA infection 01/11/2021  ? Osteoarthritis   ? hand  ? Osteoporosis   ? Palpitation   ? Parkinson's disease (HCC)   ? Peripheral vascular disease (HCC)   ? blood clots  ? Pre-diabetes   ? Vitamin D deficiency   ? ?Past Surgical History:  ?Procedure Laterality Date  ? ABDOMINAL AORTOGRAM W/LOWER EXTREMITY N/A 06/12/2021  ? Procedure: ABDOMINAL AORTOGRAM  W/LOWER EXTREMITY;  Surgeon: Iran Ouch, MD;  [facility];  Service: Cardiovascular;  Laterality: N/A;  ? AMPUTATION Left 07/03/2021  ? Procedure: LEFT BELOW KNEE AMPUTATION;  Surgeon: Nadara Mustard, MD;  Location: [facility] OR;  Service: Orthopedics;  Laterality: Left;  ? CATARACT EXTRACTION  05/31/2015  ? Eye/right  ? COLONOSCOPY   02/05/2005, 03/06/2005  ? normal, 10 years ago  ? MOHS micrographic surgery    ? Face surgery  ? ORIF ACETABULAR FRACTURE Right 10/09/2020  ? Procedure: OPEN REDUCTION INTERNAL FIXATION (ORIF) ACETABULAR FRACTURE;  Surgeon: [name], MD;  Location: [facility] OR;  Service: Orthopedics;  Laterality: Right;  ? ? ?No Known Allergies ? ?Outpatient Encounter Medications as of 07/08/2021  ?Medication Sig  ? acetaminophen (TYLENOL) 325 MG tablet Take 650 mg by mouth every 6 (six) hours as needed (Pain score 1-3 or temp>100.5.).  ? acetaminophen (TYLENOL) 500 MG tablet Take 1,000 mg by mouth every other day. Prior to L heel dressing changes.  ? albuterol (VENTOLIN HFA) 108 (90 Base) MCG/ACT inhaler Inhale 2 puffs into the lungs every 4 (four) hours as needed for wheezing or shortness of breath.  ? Amino Acids-Protein Hydrolys (FEEDING SUPPLEMENT, PRO-STAT SUGAR FREE 64,) LIQD Take 30 mLs by mouth in the morning and at bedtime.  ? apixaban (ELIQUIS) 5 MG TABS tablet Take 5 mg by mouth 2 (two) times daily.  ? calcium carbonate (OSCAL) 1500 (600 Ca) MG TABS tablet Take 600 mg of elemental calcium by mouth daily with breakfast.  ? carbidopa-levodopa (SINEMET CR) 50-200 MG tablet Take 1 tablet by mouth at bedtime.  ? carbidopa-levodopa (SINEMET IR) 25-100 MG tablet Take 1 tablet by mouth in the morning, at noon, in the evening, and at bedtime. 06:00 AM, 12:00 PM, 06:00 PM, 12:00 AM  ? ferrous sulfate 325 (65 FE) MG tablet Take 325 mg by mouth every Monday, Wednesday, and Friday.  ? oxyCODONE-acetaminophen (PERCOCET/ROXICET) 5-325 MG tablet Take 1 tablet by mouth every 4 (four) hours as needed.  ? polyethylene glycol (MIRALAX / GLYCOLAX) 17 g packet Take 17 g by mouth every other day.  ? senna (SENOKOT) 8.6 MG tablet Take 1 tablet by mouth daily.  ? torsemide (DEMADEX) 20 MG tablet Take 20-40 mg by mouth See admin instructions. Take 20mg  on Sun, Mon, Wed, Fri, Sat. Take 40mg  on Tue, Thu.  ? vitamin B-12 (CYANOCOBALAMIN) 1000 MCG tablet  Take 1,000 mcg by mouth daily.  ? Vitamin D, Cholecalciferol, 25 MCG (1000 UT) TABS Take 1,000 Units by mouth daily.  ? zinc oxide 20 % ointment Apply 1 application. topically See admin instructions. To buttocks after every incontinent episode and as needed for redness.  ? [DISCONTINUED] carbidopa-levodopa (SINEMET IR) 25-100 MG tablet Take 1 tablet by mouth 4 (four) times daily.  ? [DISCONTINUED] senna (SENOKOT) 8.6 MG TABS tablet Take 1 tablet (8.6 mg total) by mouth daily. (Patient taking differently: Take 30 tablets by mouth daily.)  ? ?No facility-administered encounter medications on file as of 07/08/2021.  ? ? ?Review of Systems  ?Constitutional:  Positive for activity change. Negative for appetite change, chills, fatigue and fever.  ?HENT:  Negative for congestion and sneezing.   ?Eyes:  Negative for visual disturbance.  ?Respiratory:  Negative for cough, shortness of breath and wheezing.   ?Cardiovascular:  Positive for leg swelling. Negative for chest pain.  ?Gastrointestinal:  Positive for constipation. Negative for abdominal distention, abdominal pain, diarrhea, nausea and vomiting.  ?Genitourinary:  Negative for dysuria,  frequency and hematuria.  ?Musculoskeletal:  Positive for arthralgias and gait problem.  ?Skin:  Positive for wound.  ?Neurological:  Positive for tremors, weakness and numbness. Negative for dizziness and headaches.  ?Psychiatric/Behavioral:  Negative for confusion, dysphoric mood and sleep disturbance. The patient is not nervous/anxious.   ? ?Immunization History  ?Administered Date(s) Administered  ? H1N1 06/13/2008  ? Influenza Split 03/05/2006, 02/26/2007, 01/26/2008, 01/18/2009, 12/12/2009, 01/16/2011, 01/29/2012, 02/01/2013, 02/08/2014, 02/13/2015, 01/10/2016, 02/23/2017, 02/24/2018, 01/24/2019, 12/22/2019  ? Influenza, High Dose Seasonal PF 01/30/2021  ? PFIZER(Purple Top)SARS-COV-2 Vaccination 05/04/2019, 05/25/2019, 09/01/2020  ? Research officer, trade unionfizer Covid-19 Vaccine Bivalent Booster 2627yrs &  up 12/27/2019, 12/26/2020  ? Pneumococcal Conjugate-13 02/10/2014  ? Pneumococcal Polysaccharide-23 06/10/2011  ? Zoster, Live 07/04/2009  ? ?Pertinent  Health Maintenance Due  ?Topic Date Due  ? INFLU

## 2021-07-09 ENCOUNTER — Ambulatory Visit: Payer: Medicare Other | Admitting: Cardiovascular Disease

## 2021-07-11 ENCOUNTER — Non-Acute Institutional Stay (SKILLED_NURSING_FACILITY): Payer: Medicare Other | Admitting: Internal Medicine

## 2021-07-11 ENCOUNTER — Encounter: Payer: Self-pay | Admitting: Internal Medicine

## 2021-07-11 ENCOUNTER — Ambulatory Visit (INDEPENDENT_AMBULATORY_CARE_PROVIDER_SITE_OTHER): Payer: Medicare Other | Admitting: Orthopedic Surgery

## 2021-07-11 DIAGNOSIS — Z89512 Acquired absence of left leg below knee: Secondary | ICD-10-CM

## 2021-07-11 DIAGNOSIS — E538 Deficiency of other specified B group vitamins: Secondary | ICD-10-CM

## 2021-07-11 DIAGNOSIS — S88112A Complete traumatic amputation at level between knee and ankle, left lower leg, initial encounter: Secondary | ICD-10-CM

## 2021-07-11 DIAGNOSIS — R600 Localized edema: Secondary | ICD-10-CM

## 2021-07-11 DIAGNOSIS — G20 Parkinson's disease: Secondary | ICD-10-CM

## 2021-07-11 DIAGNOSIS — I82411 Acute embolism and thrombosis of right femoral vein: Secondary | ICD-10-CM | POA: Diagnosis not present

## 2021-07-11 DIAGNOSIS — G20A1 Parkinson's disease without dyskinesia, without mention of fluctuations: Secondary | ICD-10-CM

## 2021-07-11 DIAGNOSIS — D500 Iron deficiency anemia secondary to blood loss (chronic): Secondary | ICD-10-CM

## 2021-07-11 NOTE — Progress Notes (Signed)
?Provider:  [name] MD ?Location:   [facility] ?Nursing Home Room Number: 33 ?Place of Service:  SNF (31) ? ?PCP: [name], MD ?Patient Care Team: ?[name], MD as PCP - General (Internal Medicine) ?Vladimir Faster, DO as Consulting Physician (Neurology) ? ?Extended Emergency Contact Information ?Primary Emergency Contact: Hargadon,jane ?Address: [address] ?         [address] ? [address] [place] ?Home Phone: [phone] ?Mobile Phone: [phone] ?Relation: Spouse ?Secondary Emergency Contact: Peltzer,kenneth ?Home Phone: [phone] ?Mobile Phone: [phone] ?Relation: Son ? ?Code Status: Full Code ?Goals of Care: Advanced Directive information ? ?  07/11/2021  ?  9:14 AM  ?Advanced Directives  ?Does Patient Have a Medical Advance Directive? Yes  ?Type of Advance Directive Living will  ?Does patient want to make changes to medical advance directive? No - Patient declined  ? ? ? ? ?Chief Complaint  ?Patient presents with  ? Readmit To SNF  ?  Readmission to SNF  ? ? ?HPI: Patient is a 88 y.o. male seen today for Readmission to SNF after Amputation ? ?Admitted from 03/29 to 03/31 for left BKA ? ?Patient has h/o Parkinson disease Follows with Dr [name] Diagn[place] in 2019 in [place] ?Has ,Bradykinesia and Rigidity ?MRI of head show mild small vessel disease as well as a few old microhemorrhages in the right cerebellum and right cerebral hemisphere.  MRI of the cervical spine that same date demonstrating multilevel degenerative changes, most pronounced at C3-C4. ? Also has h/o Osteoporosis , Vit D def Has been on Fosamax before, BPH, Arthritis,  ? ?History of right femur fracture in 08/22 followed by acute DVT of right leg. ? ?Patient developed osteomyelitis in his left heel positive for MRSA was treated with IV Vanco and Bactrim.  He finally agreed for amputation and underwent left BKA on 03/23 by Dr. Lajoyce Corners ? ?Patient is  back in SNF.  Did not have any acute  complaints it is working really hard with the therapy.  Denies any weakness shortness of breath pain. ? ? ?Past Medical History:  ?Diagnosis Date  ? Acute bronchiolitis   ? Acute osteomyelitis of calcaneum, left (HCC) 01/11/2021  ? Anemia   ? Bursitis of both hips   ? Complication of anesthesia   ? wife states pt was a little "loopy" for several days  ? Dementia (HCC)   ? due to Parkinson's  ? Hearing reduced   ? hearing aid  ? History of basal cell carcinoma   ? Hyperlipidemia   ? Malaise and fatigue   ? MRSA infection 01/11/2021  ? Osteoarthritis   ? hand  ? Osteoporosis   ? Palpitation   ? Parkinson's disease (HCC)   ? Peripheral vascular disease (HCC)   ? blood clots  ? Pre-diabetes   ? Vitamin D deficiency   ? ?Past Surgical History:  ?Procedure Laterality Date  ? ABDOMINAL AORTOGRAM W/LOWER EXTREMITY N/A 06/12/2021  ? Procedure: ABDOMINAL AORTOGRAM W/LOWER EXTREMITY;  Surgeon: Iran Ouch, MD;  [facility];  Service: Cardiovascular;  Laterality: N/A;  ? AMPUTATION Left 07/03/2021  ? Procedure: LEFT BELOW KNEE AMPUTATION;  Surgeon: Nadara Mustard, MD;  Location: [facility] OR;  Service: Orthopedics;  Laterality: Left;  ? CATARACT EXTRACTION  05/31/2015  ? Eye/right  ? COLONOSCOPY  02/05/2005, 03/06/2005  ? normal, 10 years ago  ? MOHS micrographic surgery    ? Face surgery  ? ORIF ACETABULAR FRACTURE  Right 10/09/2020  ? Procedure: OPEN REDUCTION INTERNAL FIXATION (ORIF) ACETABULAR FRACTURE;  Surgeon: Myrene GalasHandy, Michael, MD;  Location: [facility] OR;  Service: Orthopedics;  Laterality: Right;  ? ? reports that he has never smoked. He has never used smokeless tobacco. He reports that he does not drink alcohol and does not use drugs. ?Social History  ? ?Socioeconomic History  ? Marital status: Married  ?  Spouse name: Tarri AbernethyJane Elliot  ? Number of children: 2  ? Years of education: Not on file  ? Highest education level: Not on file  ?Occupational History  ? Occupation: retired  ?  Comment: art professor  ?Tobacco Use  ?  Smoking status: Never  ? Smokeless tobacco: Never  ?Vaping Use  ? Vaping Use: Never used  ?Substance and Sexual Activity  ? Alcohol use: Never  ? Drug use: Never  ? Sexual activity: Not on file  ?Other Topics Concern  ? Not on file  ?Social History Narrative  ? [place]  ? Pt leaving with wife  ? ?Social Determinants of Health  ? ?Financial Resource Strain: Low Risk   ? Difficulty of Paying Living Expenses: Not hard at all  ?Food Insecurity: No Food Insecurity  ? Worried About Programme researcher, broadcasting/film/videounning Out of Food in the Last Year: Never true  ? Ran Out of Food in the Last Year: Never true  ?Transportation Needs: No Transportation Needs  ? Lack of Transportation (Medical): No  ? Lack of Transportation (Non-Medical): No  ?Physical Activity: Inactive  ? Days of Exercise per Week: 0 days  ? Minutes of Exercise per Session: 0 min  ?Stress: No Stress Concern Present  ? Feeling of Stress : Not at all  ?Social Connections: Moderately Integrated  ? Frequency of Communication with Friends and Family: More than three times a week  ? Frequency of Social Gatherings with Friends and Family: More than three times a week  ? Attends Religious Services: 1 to 4 times per year  ? Active Member of Clubs or Organizations: No  ? Attends BankerClub or Organization Meetings: Never  ? Marital Status: Married  ?Intimate Partner Violence: Not At Risk  ? Fear of Current or Ex-Partner: No  ? Emotionally Abused: No  ? Physically Abused: No  ? Sexually Abused: No  ? ? ?Functional Status Survey: ?  ? ?Family History  ?Problem Relation Age of Onset  ? Prostate cancer Father   ? Skin cancer Brother   ? ? ?Health Maintenance  ?Topic Date Due  ? Zoster Vaccines- Shingrix (1 of 2) Never done  ? TETANUS/TDAP  01/31/2022 (Originally 04/23/1950)  ? INFLUENZA VACCINE  11/05/2021  ? Pneumonia Vaccine 4665+ Years old  Completed  ? COVID-19 Vaccine  Completed  ? HPV VACCINES  Aged Out  ? ? ?No Known Allergies ? ?Allergies as of 07/11/2021   ?No Known Allergies ?   ? ?  ?Medication List  ?  ? ?  ? Accurate as of July 11, 2021  9:14 AM. If you have any questions, ask your nurse or doctor.  ?  ?  ? ?  ? ?acetaminophen 500 MG tablet ?Commonly known as: TYLENOL ?Take 1,000 mg by mouth every other day. Prior to L heel dressing changes. ?  ?acetaminophen 325 MG tablet ?Commonly known as: TYLENOL ?Take 650 mg by mouth every 6 (six) hours as needed (Pain score 1-3 or temp>100.5.). ?  ?albuterol 108 (90 Base) MCG/ACT inhaler ?Commonly known as: VENTOLIN HFA ?Inhale 2 puffs  into the lungs every 4 (four) hours as needed for wheezing or shortness of breath. ?  ?apixaban 5 MG Tabs tablet ?Commonly known as: ELIQUIS ?Take 5 mg by mouth 2 (two) times daily. ?  ?calcium carbonate 1500 (600 Ca) MG Tabs tablet ?Commonly known as: OSCAL ?Take 600 mg of elemental calcium by mouth daily with breakfast. ?  ?carbidopa-levodopa 25-100 MG tablet ?Commonly known as: SINEMET IR ?Take 1 tablet by mouth in the morning, at noon, in the evening, and at bedtime. 06:00 AM, 12:00 PM, 06:00 PM, 12:00 AM ?  ?carbidopa-levodopa 50-200 MG tablet ?Commonly known as: SINEMET CR ?Take 1 tablet by mouth at bedtime. ?  ?feeding supplement (PRO-STAT SUGAR FREE 64) Liqd ?Take 30 mLs by mouth in the morning and at bedtime. ?  ?ferrous sulfate 325 (65 FE) MG tablet ?Take 325 mg by mouth every Monday, Wednesday, and Friday. ?  ?oxyCODONE-acetaminophen 5-325 MG tablet ?Commonly known as: PERCOCET/ROXICET ?Take 1 tablet by mouth every 4 (four) hours as needed. ?  ?polyethylene glycol 17 g packet ?Commonly known as: MIRALAX / GLYCOLAX ?Take 17 g by mouth every other day. ?  ?senna 8.6 MG tablet ?Commonly known as: SENOKOT ?Take 1 tablet by mouth daily. ?  ?torsemide 20 MG tablet ?Commonly known as: DEMADEX ?Take 20-40 mg by mouth See admin instructions. Take 20mg  on Sun, Mon, Wed, Fri, Sat. Take 40mg  on Tue, Thu. ?  ?vitamin B-12 1000 MCG tablet ?Commonly known as: CYANOCOBALAMIN ?Take 1,000 mcg by mouth daily. ?  ?Vitamin D  (Cholecalciferol) 25 MCG (1000 UT) Tabs ?Take 1,000 Units by mouth daily. ?  ?zinc oxide 20 % ointment ?Apply 1 application. topically See admin instructions. To buttocks after every incontinent episode an

## 2021-07-12 ENCOUNTER — Encounter: Payer: Self-pay | Admitting: Orthopedic Surgery

## 2021-07-12 NOTE — Progress Notes (Signed)
? ?Office Visit Note ?  ?Patient: Greg [name]           ?Date of Birth: 02-27-1932           ?MRN: [id number] ?Visit Date: 07/11/2021 ?             ?Requested by: Virgie Dad, MD ?[address] ?[place],  [place] [address] ?PCP: Virgie Dad, MD ? ?Chief Complaint  ?Patient presents with  ? Left Leg - Routine Post Op  ?  07/03/21 left BKA   ? ? ? ? ?HPI: ?Patient is a 88 year old gentleman who is 1 week status post left below-knee amputation.  The wound VAC is removed.  Patient use the new sponge design. ? ?Assessment & Plan: ?Visit Diagnoses:  ?1. Below-knee amputation of left lower extremity ([place])   ? ? ?Plan: Patient was placed in the stump shrinker we will follow-up in 1 week for evaluation for staple removal. ? ?Follow-Up Instructions: Return in about 1 week (around 07/18/2021).  ? ?Ortho Exam ? ?Patient is alert, oriented, no adenopathy, well-dressed, normal affect, normal respiratory effort. ?Examination patient has no complaints of pain the incision is well approximated there is a good contour for the residual limb.  The dressing was easy to remove. ? ?Imaging: ?No results found. ? ? ? ?Labs: ?Lab Results  ?Component Value Date  ? HGBA1C 5.8 (H) 07/03/2021  ? HGBA1C 5.8 05/23/2021  ? HGBA1C 5.9 (H) 10/08/2020  ? ESRSEDRATE 55 (H) 06/20/2021  ? ESRSEDRATE 75 (H) 03/22/2021  ? ESRSEDRATE 48 02/14/2021  ? CRP 31.9 (H) 06/20/2021  ? CRP 151.2 (H) 03/22/2021  ? CRP 16.8 01/15/2021  ? ? ? ?Lab Results  ?Component Value Date  ? ALBUMIN 3.2 (A) 07/08/2021  ? ALBUMIN 3.4 (L) 07/03/2021  ? ALBUMIN 2.9 (A) 12/27/2020  ? PREALBUMIN 18.5 07/03/2021  ? ? ?Lab Results  ?Component Value Date  ? MG 2.2 07/03/2021  ? MG 2.1 10/10/2020  ? ?Lab Results  ?Component Value Date  ? VD25OH 28.44 (L) 07/03/2021  ? VD25OH 30.55 10/09/2020  ? ? ?Lab Results  ?Component Value Date  ? PREALBUMIN 18.5 07/03/2021  ? ? ?  Latest Ref Rng & Units 07/05/2021  ?  3:02 AM 07/04/2021  ?  1:28 AM 07/03/2021  ?  4:18 PM  ?CBC EXTENDED  ?WBC 4.0  - 10.5 K/uL 6.9   8.8   7.4    ?RBC 4.22 - 5.81 MIL/uL 2.85   2.61   3.22    ?Hemoglobin 13.0 - 17.0 g/dL 8.3   7.6   9.5    ?HCT 39.0 - 52.0 % 25.6   24.0   29.6    ?Platelets 150 - 400 K/uL 198   179   207    ?NEUT# 1.7 - 7.7 K/uL   6.6    ?Lymph# 0.7 - 4.0 K/uL   0.6    ? ? ? ?There is no height or weight on file to calculate BMI. ? ?Orders:  ?No orders of the defined types were placed in this encounter. ? ?No orders of the defined types were placed in this encounter. ? ? ? Procedures: ?No procedures performed ? ?Clinical Data: ?No additional findings. ? ?ROS: ? ?All other systems negative, except as noted in the HPI. ?Review of Systems ? ?Objective: ?Vital Signs: There were no vitals taken for this visit. ? ?Specialty Comments:  ?No specialty comments available. ? ?PMFS History: ?Patient Active Problem List  ? Diagnosis Date  Noted  ? Below-knee amputation of left lower extremity ([place]) 07/03/2021  ? Vitamin B12 deficiency 03/05/2021  ? Candida rash of groin 03/05/2021  ? (HFpEF) heart failure with preserved ejection fraction ([place]) 02/04/2021  ? Acute osteomyelitis of left calcaneus ([place]) 01/11/2021  ? MRSA infection 01/11/2021  ? CKD (chronic kidney disease) stage 3, GFR 30-59 ml/min (HCC) 01/08/2021  ? DVT of deep femoral vein, right ([place]) 12/22/2020  ? Pressure ulcer, unstageable ([place]) 11/12/2020  ? Hyponatremia 10/15/2020  ? Prediabetes 10/15/2020  ? HTN (hypertension) 10/15/2020  ? Slow transit constipation 10/15/2020  ? Blood loss anemia 10/15/2020  ? Closed displaced fracture of right acetabulum ([place])   ? Closed right hip fracture ([place]) 10/08/2020  ? Parkinson's disease ([place]) 09/25/2020  ? BPH (benign prostatic hyperplasia) 09/25/2020  ? Edema 09/25/2020  ? Osteoarthritis, multiple sites 09/25/2020  ? Osteoporosis 09/25/2020  ? Hyperlipidemia 09/25/2020  ? ?Past Medical History:  ?Diagnosis Date  ? Acute bronchiolitis   ? Acute osteomyelitis of calcaneum, left ([place]) 01/11/2021  ? Anemia   ? Bursitis of both  hips   ? Complication of anesthesia   ? wife states pt was a little "loopy" for several days  ? Dementia ([place])   ? due to Parkinson's  ? Hearing reduced   ? hearing aid  ? History of basal cell carcinoma   ? Hyperlipidemia   ? Malaise and fatigue   ? MRSA infection 01/11/2021  ? Osteoarthritis   ? hand  ? Osteoporosis   ? Palpitation   ? Parkinson's disease ([place])   ? Peripheral vascular disease ([place])   ? blood clots  ? Pre-diabetes   ? Vitamin D deficiency   ?  ?Family History  ?Problem Relation Age of Onset  ? Prostate cancer Father   ? Skin cancer Brother   ?  ?Past Surgical History:  ?Procedure Laterality Date  ? ABDOMINAL AORTOGRAM W/LOWER EXTREMITY N/A 06/12/2021  ? Procedure: ABDOMINAL AORTOGRAM W/LOWER EXTREMITY;  Surgeon: Wellington Hampshire, MD;  Location: [facility];  Service: Cardiovascular;  Laterality: N/A;  ? AMPUTATION Left 07/03/2021  ? Procedure: LEFT BELOW KNEE AMPUTATION;  Surgeon: Newt Minion, MD;  Location: [facility];  Service: Orthopedics;  Laterality: Left;  ? CATARACT EXTRACTION  05/31/2015  ? Eye/right  ? COLONOSCOPY  02/05/2005, 03/06/2005  ? normal, 10 years ago  ? MOHS micrographic surgery    ? Face surgery  ? ORIF ACETABULAR FRACTURE Right 10/09/2020  ? Procedure: OPEN REDUCTION INTERNAL FIXATION (ORIF) ACETABULAR FRACTURE;  Surgeon: Altamese Heflin, MD;  Location: [facility];  Service: Orthopedics;  Laterality: Right;  ? ?Social History  ? ?Occupational History  ? Occupation: retired  ?  Comment: art professor  ?Tobacco Use  ? Smoking status: Never  ? Smokeless tobacco: Never  ?Vaping Use  ? Vaping Use: Never used  ?Substance and Sexual Activity  ? Alcohol use: Never  ? Drug use: Never  ? Sexual activity: Not on file  ? ? ? ? ? ?

## 2021-07-13 ENCOUNTER — Encounter: Payer: Self-pay | Admitting: Internal Medicine

## 2021-07-18 LAB — CBC: RBC: 3.33 — AB (ref 3.87–5.11)

## 2021-07-18 LAB — COMPREHENSIVE METABOLIC PANEL
Calcium: 8.9 (ref 8.7–10.7)
eGFR: 64

## 2021-07-18 LAB — CBC AND DIFFERENTIAL
HCT: 30 — AB (ref 41–53)
Hemoglobin: 9.8 — AB (ref 13.5–17.5)
Neutrophils Absolute: 3471
Platelets: 276 10*3/uL (ref 150–400)
WBC: 5.5

## 2021-07-18 LAB — BASIC METABOLIC PANEL
BUN: 34 — AB (ref 4–21)
CO2: 32 — AB (ref 13–22)
Chloride: 100 (ref 99–108)
Creatinine: 1.1 (ref 0.6–1.3)
Glucose: 82
Potassium: 4.5 mEq/L (ref 3.5–5.1)
Sodium: 137 (ref 137–147)

## 2021-07-19 ENCOUNTER — Non-Acute Institutional Stay (SKILLED_NURSING_FACILITY): Payer: Medicare Other | Admitting: Orthopedic Surgery

## 2021-07-19 ENCOUNTER — Encounter: Payer: Self-pay | Admitting: Orthopedic Surgery

## 2021-07-19 DIAGNOSIS — Z89512 Acquired absence of left leg below knee: Secondary | ICD-10-CM

## 2021-07-19 DIAGNOSIS — D500 Iron deficiency anemia secondary to blood loss (chronic): Secondary | ICD-10-CM

## 2021-07-19 DIAGNOSIS — R600 Localized edema: Secondary | ICD-10-CM | POA: Diagnosis not present

## 2021-07-19 DIAGNOSIS — G20 Parkinson's disease: Secondary | ICD-10-CM

## 2021-07-19 DIAGNOSIS — I82411 Acute embolism and thrombosis of right femoral vein: Secondary | ICD-10-CM | POA: Diagnosis not present

## 2021-07-19 DIAGNOSIS — G20A1 Parkinson's disease without dyskinesia, without mention of fluctuations: Secondary | ICD-10-CM

## 2021-07-19 NOTE — Progress Notes (Signed)
?Location:  [facility] Room Number: N27/A ?Place of Service:  SNF (31) ?Provider: Yvonna Alanis, NP ? ?Patient Care Team: ?Virgie Dad, MD as PCP - General (Internal Medicine) ?Ludwig Clarks, DO as Consulting Physician (Neurology) ? ?Extended Emergency Contact Information ?Primary Emergency Contact: Yount,jane ?Address: [address] ?         [address] ? [address] [place] ?Home Phone: [phone] ?Mobile Phone: [phone] ?Relation: Spouse ?Secondary Emergency Contact: Ponti,kenneth ?Home Phone: [phone] ?Mobile Phone: [phone] ?Relation: Son ? ?Code Status:  Full Code ?Goals of care: Advanced Directive information ? ?  07/19/2021  ?  9:37 AM  ?Advanced Directives  ?Does Patient Have a Medical Advance Directive? Yes  ?Type of Advance Directive Living will  ?Does patient want to make changes to medical advance directive? No - Patient declined  ? ? ? ?Chief Complaint  ?Patient presents with  ? Acute Visit  ?  Ankle edema  ? ? ?HPI:  ?Pt is a 88 y.o. male seen today for an acute visit for ankle edema.  ? ?Currently resides on the skilled nursing unit at [facility].  ? ?S/P left below knee amputation- surgery 03/29 by Dr. Sharol Given due to ongoing osteomyelitis of left heel, tolerated procedure well, prevena wound vac discontinued 04/06, doing well with PT/OT, denies pain, some phantom "feelings,"  remains on Eliquis for DVT prophylaxis, LBM 04/14  ?Bilateral leg edema- BUN/creat 34/1.1 07/18/2021, improved since recent surgery, denies cough or sob, remains on daily torsemide, see weights below ?DVT- - diagnosed 12/2020, remains on Eliquis ?Parkinson's- diagnosed 2019, followed by neurology, remains on Sinemet ?Anemia- Hgb 9.8 (04/13)> 8.3 (03/31), remains on ferrous sulfate ? ?Recent weights: ? 04/13- 181.3 lbs ? 03/24- 186.9 lbs ? 03/17- 187.1 lbs ? ?  ? ?Past Medical History:  ?Diagnosis Date  ? Acute bronchiolitis   ? Acute osteomyelitis of calcaneum,  left ([place]) 01/11/2021  ? Anemia   ? Bursitis of both hips   ? Complication of anesthesia   ? wife states pt was a little "loopy" for several days  ? Dementia ([place])   ? due to Parkinson's  ? Hearing reduced   ? hearing aid  ? History of basal cell carcinoma   ? Hyperlipidemia   ? Malaise and fatigue   ? MRSA infection 01/11/2021  ? Osteoarthritis   ? hand  ? Osteoporosis   ? Palpitation   ? Parkinson's disease ([place])   ? Peripheral vascular disease ([place])   ? blood clots  ? Pre-diabetes   ? Vitamin D deficiency   ? ?Past Surgical History:  ?Procedure Laterality Date  ? ABDOMINAL AORTOGRAM W/LOWER EXTREMITY N/A 06/12/2021  ? Procedure: ABDOMINAL AORTOGRAM W/LOWER EXTREMITY;  Surgeon: Wellington Hampshire, MD;  Location: [facility];  Service: Cardiovascular;  Laterality: N/A;  ? AMPUTATION Left 07/03/2021  ? Procedure: LEFT BELOW KNEE AMPUTATION;  Surgeon: Newt Minion, MD;  Location: [facility];  Service: Orthopedics;  Laterality: Left;  ? CATARACT EXTRACTION  05/31/2015  ? Eye/right  ? COLONOSCOPY  02/05/2005, 03/06/2005  ? normal, 10 years ago  ? MOHS micrographic surgery    ? Face surgery  ? ORIF ACETABULAR FRACTURE Right 10/09/2020  ? Procedure: OPEN REDUCTION INTERNAL FIXATION (ORIF) ACETABULAR FRACTURE;  Surgeon: Altamese [place], MD;  Location: [facility];  Service: Orthopedics;  Laterality: Right;  ? ? ?No Known Allergies ? ?Outpatient Encounter Medications as of 07/19/2021  ?Medication Sig  ? acetaminophen (  TYLENOL) 325 MG tablet Take 650 mg by mouth every 6 (six) hours as needed (Pain score 1-3 or temp>100.5.).  ? acetaminophen (TYLENOL) 500 MG tablet Take 1,000 mg by mouth every other day. Prior to L heel dressing changes.  ? albuterol (VENTOLIN HFA) 108 (90 Base) MCG/ACT inhaler Inhale 2 puffs into the lungs every 4 (four) hours as needed for wheezing or shortness of breath.  ? Amino Acids-Protein Hydrolys (FEEDING SUPPLEMENT, PRO-STAT SUGAR FREE 64,) LIQD Take 30 mLs by mouth in the morning and at bedtime.  ? apixaban  (ELIQUIS) 5 MG TABS tablet Take 5 mg by mouth 2 (two) times daily.  ? calcium carbonate (OSCAL) 1500 (600 Ca) MG TABS tablet Take 600 mg of elemental calcium by mouth daily with breakfast.  ? carbidopa-levodopa (SINEMET CR) 50-200 MG tablet Take 1 tablet by mouth at bedtime.  ? carbidopa-levodopa (SINEMET IR) 25-100 MG tablet Take 1 tablet by mouth in the morning, at noon, in the evening, and at bedtime. 06:00 AM, 12:00 PM, 06:00 PM, 12:00 AM  ? ferrous sulfate 325 (65 FE) MG tablet Take 325 mg by mouth every Monday, Wednesday, and Friday.  ? oxyCODONE-acetaminophen (PERCOCET/ROXICET) 5-325 MG tablet Take 1 tablet by mouth every 4 (four) hours as needed.  ? polyethylene glycol (MIRALAX / GLYCOLAX) 17 g packet Take 17 g by mouth every other day.  ? senna (SENOKOT) 8.6 MG tablet Take 1 tablet by mouth daily.  ? torsemide (DEMADEX) 20 MG tablet Take 20-40 mg by mouth See admin instructions. Take 20mg  on Sun, Mon, Wed, Fri, Sat. Take 40mg  on Tue, Thu.  ? vitamin B-12 (CYANOCOBALAMIN) 1000 MCG tablet Take 1,000 mcg by mouth daily.  ? Vitamin D, Cholecalciferol, 25 MCG (1000 UT) TABS Take 1,000 Units by mouth daily.  ? zinc oxide 20 % ointment Apply 1 application. topically See admin instructions. To buttocks after every incontinent episode and as needed for redness.  ? ?No facility-administered encounter medications on file as of 07/19/2021.  ? ? ?Review of Systems  ?Constitutional:  Negative for activity change, appetite change, chills, fatigue and fever.  ?HENT:  Negative for congestion and sore throat.   ?Eyes:  Negative for visual disturbance.  ?Respiratory:  Negative for cough, shortness of breath and wheezing.   ?Cardiovascular:  Positive for leg swelling. Negative for chest pain.  ?Gastrointestinal:  Positive for constipation and diarrhea. Negative for abdominal distention, abdominal pain, nausea and vomiting.  ?Genitourinary:  Positive for frequency. Negative for dysuria and hematuria.  ?Musculoskeletal:  Positive  for arthralgias and gait problem.  ?Skin:  Positive for wound.  ?Neurological:  Positive for tremors and weakness. Negative for dizziness and headaches.  ?Psychiatric/Behavioral:  Negative for confusion, dysphoric mood and sleep disturbance. The patient is not nervous/anxious.   ? ?Immunization History  ?Administered Date(s) Administered  ? H1N1 06/13/2008  ? Influenza Split 03/05/2006, 02/26/2007, 01/26/2008, 01/18/2009, 12/12/2009, 01/16/2011, 01/29/2012, 02/01/2013, 02/08/2014, 02/13/2015, 01/10/2016, 02/23/2017, 02/24/2018, 01/24/2019, 12/22/2019  ? Influenza, High Dose Seasonal PF 01/30/2021  ? PFIZER(Purple Top)SARS-COV-2 Vaccination 05/04/2019, 05/25/2019, 09/01/2020  ? Pension scheme manager 60yrs & up 12/27/2019, 12/26/2020  ? Pneumococcal Conjugate-13 02/10/2014  ? Pneumococcal Polysaccharide-23 06/10/2011  ? Zoster, Live 07/04/2009  ? ?Pertinent  Health Maintenance Due  ?Topic Date Due  ? INFLUENZA VACCINE  11/05/2021  ? ? ?  07/03/2021  ?  5:00 PM 07/03/2021  ?  7:16 PM 07/04/2021  ?  9:30 AM 07/04/2021  ?  7:18 PM 07/05/2021  ?  7:15 AM  ?  Fall Risk  ?Patient Fall Risk Level Moderate fall risk Moderate fall risk Moderate fall risk Moderate fall risk High fall risk  ? ?Functional Status Survey: ?  ? ?Vitals:  ? 07/19/21 0933  ?BP: 116/67  ?Pulse: 83  ?Resp: 18  ?Temp: (!) 97.2 ?F (36.2 ?C)  ?SpO2: 95%  ?Weight: 181 lb 4.8 oz (82.2 kg)  ?Height: 5\' 9"  (1.753 m)  ? ?Body mass index is 26.77 kg/m?Marland Kitchen ?Physical Exam ?Vitals reviewed.  ?Constitutional:   ?   General: He is not in acute distress. ?HENT:  ?   Head: Normocephalic.  ?Eyes:  ?   General:     ?   Right eye: No discharge.     ?   Left eye: No discharge.  ?Neck:  ?   Vascular: No carotid bruit.  ?Cardiovascular:  ?   Rate and Rhythm: Normal rate and regular rhythm.  ?   Pulses: Normal pulses.  ?   Heart sounds: Murmur heard.  ?Pulmonary:  ?   Effort: Pulmonary effort is normal. No respiratory distress.  ?   Breath sounds: Normal breath  sounds. No wheezing or rales.  ?Abdominal:  ?   General: Bowel sounds are normal. There is no distension.  ?   Palpations: Abdomen is soft.  ?   Tenderness: There is no abdominal tenderness.  ?Musculoskelet

## 2021-07-25 ENCOUNTER — Ambulatory Visit (INDEPENDENT_AMBULATORY_CARE_PROVIDER_SITE_OTHER): Payer: Medicare Other | Admitting: Orthopedic Surgery

## 2021-07-25 DIAGNOSIS — Z89512 Acquired absence of left leg below knee: Secondary | ICD-10-CM

## 2021-07-25 DIAGNOSIS — S88112A Complete traumatic amputation at level between knee and ankle, left lower leg, initial encounter: Secondary | ICD-10-CM

## 2021-07-25 LAB — CBC AND DIFFERENTIAL
HCT: 32 — AB (ref 41–53)
Hemoglobin: 10.3 — AB (ref 13.5–17.5)
Platelets: 242 10*3/uL (ref 150–400)
WBC: 6

## 2021-07-25 LAB — CBC: RBC: 3.49 — AB (ref 3.87–5.11)

## 2021-08-06 ENCOUNTER — Encounter: Payer: Self-pay | Admitting: Orthopedic Surgery

## 2021-08-06 NOTE — Progress Notes (Signed)
? ?Office Visit Note ?  ?Patient: Greg [name]           ?Date of Birth: 02/20/1932           ?MRN: [id number] ?Visit Date: 07/25/2021 ?             ?Requested by: Virgie Dad, MD ?[address] ?[place],  [place] [address] ?PCP: Virgie Dad, MD ? ?Chief Complaint  ?Patient presents with  ? Left Leg - Routine Post Op  ?  07/03/21 left BKA  ? ? ? ? ?HPI: ?Patient is an 88 year old gentleman who is 3 weeks status post left transtibial amputation.  Patient has no pain or complaints. ? ?Assessment & Plan: ?Visit Diagnoses:  ?1. Below-knee amputation of left lower extremity ([place])   ? ? ?Plan: Patient is given a prescription for [employer] for a prosthesis.  He will need a 3 XL stump shrinker.  He may begin showering. ? ?Follow-Up Instructions: Return in about 3 weeks (around 08/15/2021).  ? ?Ortho Exam ? ?Patient is alert, oriented, no adenopathy, well-dressed, normal affect, normal respiratory effort. ?Examination incision is well-healed staples are removed.  Patient lacks about 20 degrees of full extension.  The incision is well-healed. ? ?Patient is a new left transtibial  amputee. ? ?Patient's current comorbidities are not expected to impact the ability to function with the prescribed prosthesis. ?Patient verbally communicates a strong desire to use a prosthesis. ?Patient currently requires mobility aids to ambulate without a prosthesis.  Expects not to use mobility aids with a new prosthesis. ? ?Patient is a K2 level ambulator that will use a prosthesis to walk around their home and the community over low level environmental barriers.  ? ? ? ? ?Imaging: ?No results found. ? ? ?Labs: ?Lab Results  ?Component Value Date  ? HGBA1C 5.8 (H) 07/03/2021  ? HGBA1C 5.8 05/23/2021  ? HGBA1C 5.9 (H) 10/08/2020  ? ESRSEDRATE 55 (H) 06/20/2021  ? ESRSEDRATE 75 (H) 03/22/2021  ? ESRSEDRATE 48 02/14/2021  ? CRP 31.9 (H) 06/20/2021  ? CRP 151.2 (H) 03/22/2021  ? CRP 16.8 01/15/2021  ? ? ? ?Lab Results  ?Component Value Date  ?  ALBUMIN 3.2 (A) 07/08/2021  ? ALBUMIN 3.4 (L) 07/03/2021  ? ALBUMIN 2.9 (A) 12/27/2020  ? PREALBUMIN 18.5 07/03/2021  ? ? ?Lab Results  ?Component Value Date  ? MG 2.2 07/03/2021  ? MG 2.1 10/10/2020  ? ?Lab Results  ?Component Value Date  ? VD25OH 28.44 (L) 07/03/2021  ? VD25OH 30.55 10/09/2020  ? ? ?Lab Results  ?Component Value Date  ? PREALBUMIN 18.5 07/03/2021  ? ? ?  Latest Ref Rng & Units 07/18/2021  ? 12:00 AM 07/05/2021  ?  3:02 AM 07/04/2021  ?  1:28 AM  ?CBC EXTENDED  ?WBC  5.5      6.9   8.8    ?RBC 3.87 - 5.11 3.33      2.85   2.61    ?Hemoglobin 13.5 - 17.5 9.8      8.3   7.6    ?HCT 41 - 53 30      25.6   24.0    ?Platelets 150 - 400 K/uL 276      198   179    ?NEUT#  3,471.00         ?  ? This result is from an external source.  ? ? ? ?There is no height or weight on file to calculate BMI. ? ?  Orders:  ?No orders of the defined types were placed in this encounter. ? ?No orders of the defined types were placed in this encounter. ? ? ? Procedures: ?No procedures performed ? ?Clinical Data: ?No additional findings. ? ?ROS: ? ?All other systems negative, except as noted in the HPI. ?Review of Systems ? ?Objective: ?Vital Signs: There were no vitals taken for this visit. ? ?Specialty Comments:  ?No specialty comments available. ? ?PMFS History: ?Patient Active Problem List  ? Diagnosis Date Noted  ? Below-knee amputation of left lower extremity ([place]) 07/03/2021  ? Vitamin B12 deficiency 03/05/2021  ? Candida rash of groin 03/05/2021  ? (HFpEF) heart failure with preserved ejection fraction ([place]) 02/04/2021  ? Acute osteomyelitis of left calcaneus ([place]) 01/11/2021  ? MRSA infection 01/11/2021  ? CKD (chronic kidney disease) stage 3, GFR 30-59 ml/min (HCC) 01/08/2021  ? DVT of deep femoral vein, right ([place]) 12/22/2020  ? Pressure ulcer, unstageable ([place]) 11/12/2020  ? Hyponatremia 10/15/2020  ? Prediabetes 10/15/2020  ? HTN (hypertension) 10/15/2020  ? Slow transit constipation 10/15/2020  ? Blood loss anemia  10/15/2020  ? Closed displaced fracture of right acetabulum ([place])   ? Closed right hip fracture ([place]) 10/08/2020  ? Parkinson's disease ([place]) 09/25/2020  ? BPH (benign prostatic hyperplasia) 09/25/2020  ? Edema 09/25/2020  ? Osteoarthritis, multiple sites 09/25/2020  ? Osteoporosis 09/25/2020  ? Hyperlipidemia 09/25/2020  ? ?Past Medical History:  ?Diagnosis Date  ? Acute bronchiolitis   ? Acute osteomyelitis of calcaneum, left ([place]) 01/11/2021  ? Anemia   ? Bursitis of both hips   ? Complication of anesthesia   ? wife states pt was a little "loopy" for several days  ? Dementia ([place])   ? due to Parkinson's  ? Hearing reduced   ? hearing aid  ? History of basal cell carcinoma   ? Hyperlipidemia   ? Malaise and fatigue   ? MRSA infection 01/11/2021  ? Osteoarthritis   ? hand  ? Osteoporosis   ? Palpitation   ? Parkinson's disease ([place])   ? Peripheral vascular disease ([place])   ? blood clots  ? Pre-diabetes   ? Vitamin D deficiency   ?  ?Family History  ?Problem Relation Age of Onset  ? Prostate cancer Father   ? Skin cancer Brother   ?  ?Past Surgical History:  ?Procedure Laterality Date  ? ABDOMINAL AORTOGRAM W/LOWER EXTREMITY N/A 06/12/2021  ? Procedure: ABDOMINAL AORTOGRAM W/LOWER EXTREMITY;  Surgeon: Wellington Hampshire, MD;  Location: [facility];  Service: Cardiovascular;  Laterality: N/A;  ? AMPUTATION Left 07/03/2021  ? Procedure: LEFT BELOW KNEE AMPUTATION;  Surgeon: Newt Minion, MD;  Location: [facility];  Service: Orthopedics;  Laterality: Left;  ? CATARACT EXTRACTION  05/31/2015  ? Eye/right  ? COLONOSCOPY  02/05/2005, 03/06/2005  ? normal, 10 years ago  ? MOHS micrographic surgery    ? Face surgery  ? ORIF ACETABULAR FRACTURE Right 10/09/2020  ? Procedure: OPEN REDUCTION INTERNAL FIXATION (ORIF) ACETABULAR FRACTURE;  Surgeon: Altamese Clovis, MD;  Location: [facility];  Service: Orthopedics;  Laterality: Right;  ? ?Social History  ? ?Occupational History  ? Occupation: retired  ?  Comment: art professor  ?Tobacco  Use  ? Smoking status: Never  ? Smokeless tobacco: Never  ?Vaping Use  ? Vaping Use: Never used  ?Substance and Sexual Activity  ? Alcohol use: Never  ? Drug use: Never  ? Sexual activity: Not on file  ? ? ? ? ? ?

## 2021-08-12 ENCOUNTER — Encounter: Payer: Self-pay | Admitting: Orthopedic Surgery

## 2021-08-12 ENCOUNTER — Non-Acute Institutional Stay (SKILLED_NURSING_FACILITY): Payer: Medicare Other | Admitting: Orthopedic Surgery

## 2021-08-12 DIAGNOSIS — E538 Deficiency of other specified B group vitamins: Secondary | ICD-10-CM

## 2021-08-12 DIAGNOSIS — M810 Age-related osteoporosis without current pathological fracture: Secondary | ICD-10-CM

## 2021-08-12 DIAGNOSIS — D500 Iron deficiency anemia secondary to blood loss (chronic): Secondary | ICD-10-CM

## 2021-08-12 DIAGNOSIS — L0231 Cutaneous abscess of buttock: Secondary | ICD-10-CM | POA: Diagnosis not present

## 2021-08-12 DIAGNOSIS — I10 Essential (primary) hypertension: Secondary | ICD-10-CM

## 2021-08-12 DIAGNOSIS — I82411 Acute embolism and thrombosis of right femoral vein: Secondary | ICD-10-CM | POA: Diagnosis not present

## 2021-08-12 DIAGNOSIS — K5903 Drug induced constipation: Secondary | ICD-10-CM

## 2021-08-12 DIAGNOSIS — G20 Parkinson's disease: Secondary | ICD-10-CM

## 2021-08-12 DIAGNOSIS — R600 Localized edema: Secondary | ICD-10-CM

## 2021-08-12 DIAGNOSIS — Z89512 Acquired absence of left leg below knee: Secondary | ICD-10-CM

## 2021-08-12 DIAGNOSIS — N1832 Chronic kidney disease, stage 3b: Secondary | ICD-10-CM

## 2021-08-12 DIAGNOSIS — G20A1 Parkinson's disease without dyskinesia, without mention of fluctuations: Secondary | ICD-10-CM

## 2021-08-12 DIAGNOSIS — R7303 Prediabetes: Secondary | ICD-10-CM

## 2021-08-12 MED ORDER — SACCHAROMYCES BOULARDII 250 MG PO CAPS: 250.0000 mg | ORAL_CAPSULE | Freq: Two times a day (BID) | ORAL | 0 refills | Status: AC

## 2021-08-12 MED ORDER — DOXYCYCLINE HYCLATE 100 MG PO TABS: 100.0000 mg | ORAL_TABLET | Freq: Two times a day (BID) | ORAL | 0 refills | Status: AC

## 2021-08-12 NOTE — Progress Notes (Signed)
?Location:  [facility] ?Nursing Home Room Number: N27/A ?Place of Service:  SNF (31) ?Provider: Octavia HeirAmy E Merrill Villarruel, NP ? ?Patient Care Team: ?Mahlon GammonGupta, Anjali L, MD as PCP - General (Internal Medicine) ?Vladimir Fasterat, Rebecca S, DO as Consulting Physician (Neurology) ? ?Extended Emergency Contact Information ?Primary Emergency Contact: Scardina,jane ?Address: [address] ?         [address] ? [id number] [place] ?Home Phone: [phone] ?Mobile Phone: [phone] ?Relation: Spouse ?Secondary Emergency Contact: Morath,kenneth ?Home Phone: [phone] ?Mobile Phone: [phone] ?Relation: Son ? ?Code Status:  Full code ?Goals of care: Advanced Directive information ? ?  08/12/2021  ?  9:43 AM  ?Advanced Directives  ?Does Patient Have a Medical Advance Directive? Yes  ?Type of Advance Directive Living will  ?Does patient want to make changes to medical advance directive? No - Patient declined  ? ? ? ?Chief Complaint  ?Patient presents with  ? Medical Management of Chronic Issues  ?  Routine visit.  ? Quality Metric Gaps  ?  Discuss the need for Shingrix vaccine, or post pone if patient refuses.   ? ? ?HPI:  ?Pt is a 88 y.o. male seen today for medical management of chronic diseases.   ? 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 Lajoyce Cornersuda due to ongoing osteomyelitis (+MRSA) of left heel, doing well with PT/OT, denies phantom pain, visited [facility] for prosthesis- advised to f/u in 3 weeks, remains on Eliquis for DVT prophylaxis ?Abscess to left buttocks- nurse reports pus filled blister to left buttocks today, patient denies pain/fever, h/o incontinence ?BLE- LV EF 60-65% 02/20/2021, weight has fluctuated in past 2 months- unable to tolerate aggressive diuresis, pitting edema BLE L>R, remains on torsemide daily ?DVT of deep femoral vein- diagnosed 12/2020, remains on Eliquis ?Parkinson's-  diagnosed 2019, followed by neurology, remains on sinemet ?HTN- BUN/creat 34/1.1 07/18/2021, hypotension last month- improved,  off carvedilol at this time ?CKD 3b- see above ?Prediabetes- - A1c 5.8 07/03/2021 ?Anemia- hgb 10.3 07/25/2021 , remains on ferrous sulfate M/W/F ?Vitamin B12 deficiency- B12 205 03/04/2021, remains on cyanocobalamin daily ?Osteoporosis- ORIF right hip 10/10/2020, stopped fosamax 2018 (6 years use), remains on calcium and vitamin D supplements ?Constipation- LBM 05/07, remains on miralax QOD and senna ? ?No recent falls or injuries. Ambulates with wheelchair.  ? ?Recent blood pressures: ? 05/02- 100/52 ? 04/25- 103/62 ? 04/18- 107/57 ? ?Recent weights: ? 05/01- 176.6 lbs ? 04/03- 181.3 lbs ? 03/03- 184.8 lbs ? ?Past Medical History:  ?Diagnosis Date  ? Acute bronchiolitis   ? Acute osteomyelitis of calcaneum, left (HCC) 01/11/2021  ? Anemia   ? Bursitis of both hips   ? Complication of anesthesia   ? wife states pt was a little "loopy" for several days  ? Dementia (HCC)   ? due to Parkinson's  ? Hearing reduced   ? hearing aid  ? History of basal cell carcinoma   ? Hyperlipidemia   ? Malaise and fatigue   ? MRSA infection 01/11/2021  ? Osteoarthritis   ? hand  ? Osteoporosis   ? Palpitation   ? Parkinson's disease (HCC)   ? Peripheral vascular disease (HCC)   ? blood clots  ? Pre-diabetes   ? Vitamin D deficiency   ? ?Past Surgical History:  ?Procedure Laterality Date  ? ABDOMINAL AORTOGRAM W/LOWER EXTREMITY N/A 06/12/2021  ? Procedure: ABDOMINAL AORTOGRAM W/LOWER EXTREMITY;  Surgeon: Iran OuchArida, Muhammad A, MD;  Location: [facility] INVASIVE CV LAB;  Service: Cardiovascular;  Laterality:  N/A;  ? AMPUTATION Left 07/03/2021  ? Procedure: LEFT BELOW KNEE AMPUTATION;  Surgeon: Nadara Mustard, MD;  Location: [facility] OR;  Service: Orthopedics;  Laterality: Left;  ? CATARACT EXTRACTION  05/31/2015  ? Eye/right  ? COLONOSCOPY  02/05/2005, 03/06/2005  ? normal, 10 years ago  ? MOHS micrographic surgery    ? Face surgery  ? ORIF ACETABULAR FRACTURE Right 10/09/2020  ? Procedure: OPEN REDUCTION INTERNAL FIXATION (ORIF) ACETABULAR FRACTURE;  Surgeon:  [name], MD;  Location: [facility] OR;  Service: Orthopedics;  Laterality: Right;  ? ? ?No Known Allergies ? ?Outpatient Encounter Medications as of 08/12/2021  ?Medication Sig  ? acetaminophen (TYLENOL) 325 MG tablet Take 650 mg by mouth every 6 (six) hours as needed (Pain score 1-3 or temp>100.5.).  ? albuterol (VENTOLIN HFA) 108 (90 Base) MCG/ACT inhaler Inhale 2 puffs into the lungs every 4 (four) hours as needed for wheezing or shortness of breath.  ? Amino Acids-Protein Hydrolys (FEEDING SUPPLEMENT, PRO-STAT SUGAR FREE 64,) LIQD Take 30 mLs by mouth in the morning and at bedtime.  ? apixaban (ELIQUIS) 5 MG TABS tablet Take 5 mg by mouth 2 (two) times daily.  ? calcium carbonate (OSCAL) 1500 (600 Ca) MG TABS tablet Take 600 mg of elemental calcium by mouth daily with breakfast.  ? carbidopa-levodopa (SINEMET CR) 50-200 MG tablet Take 1 tablet by mouth at bedtime.  ? carbidopa-levodopa (SINEMET IR) 25-100 MG tablet Take 1 tablet by mouth in the morning, at noon, in the evening, and at bedtime. 06:00 AM, 12:00 PM, 06:00 PM, 12:00 AM  ? ferrous sulfate 325 (65 FE) MG tablet Take 325 mg by mouth every Monday, Wednesday, and Friday.  ? Multiple Vitamins-Minerals (PRESERVISION AREDS 2+MULTI VIT) CAPS Take 1 capsule by mouth in the morning and at bedtime.  ? oxyCODONE-acetaminophen (PERCOCET/ROXICET) 5-325 MG tablet Take 1 tablet by mouth every 4 (four) hours as needed.  ? polyethylene glycol (MIRALAX / GLYCOLAX) 17 g packet Take 17 g by mouth every other day.  ? senna (SENOKOT) 8.6 MG tablet Take 1 tablet by mouth daily.  ? torsemide (DEMADEX) 20 MG tablet Take 20-40 mg by mouth See admin instructions. Take 20mg  on Sun, Mon, Wed, Fri, Sat. Take 40mg  on Tue, Thu.  ? vitamin B-12 (CYANOCOBALAMIN) 1000 MCG tablet Take 1,000 mcg by mouth daily.  ? Vitamin D, Cholecalciferol, 25 MCG (1000 UT) TABS Take 1,000 Units by mouth daily.  ? zinc oxide 20 % ointment Apply 1 application. topically See admin instructions. To buttocks  after every incontinent episode and as needed for redness.  ? [DISCONTINUED] acetaminophen (TYLENOL) 500 MG tablet Take 1,000 mg by mouth every other day. Prior to L heel dressing changes.  ? ?No facility-administered encounter medications on file as of 08/12/2021.  ? ? ?Review of Systems  ?Constitutional:  Negative for activity change, appetite change, chills, fatigue and fever.  ?HENT:  Negative for congestion and trouble swallowing.   ?Eyes:  Negative for visual disturbance.  ?Respiratory:  Negative for cough, shortness of breath and wheezing.   ?Cardiovascular:  Positive for leg swelling. Negative for chest pain.  ?Gastrointestinal:  Positive for constipation. Negative for abdominal distention, abdominal pain, diarrhea, nausea and vomiting.  ?Genitourinary:  Negative for dysuria, frequency and hematuria.  ?     Incontinence  ?Musculoskeletal:  Positive for gait problem.  ?Skin:  Positive for wound.  ?Neurological:  Positive for weakness. Negative for dizziness and headaches.  ?Psychiatric/Behavioral:  Negative for confusion, dysphoric mood and sleep  disturbance. The patient is not nervous/anxious.   ? ?Immunization History  ?Administered Date(s) Administered  ? H1N1 06/13/2008  ? Influenza Split 03/05/2006, 02/26/2007, 01/26/2008, 01/18/2009, 12/12/2009, 01/16/2011, 01/29/2012, 02/01/2013, 02/08/2014, 02/13/2015, 01/10/2016, 02/23/2017, 02/24/2018, 01/24/2019, 12/22/2019  ? Influenza, High Dose Seasonal PF 01/30/2021  ? PFIZER(Purple Top)SARS-COV-2 Vaccination 05/04/2019, 05/25/2019, 09/01/2020  ? Research officer, trade [employer] 29yrs & up 12/27/2019, 12/26/2020  ? Pneumococcal Conjugate-13 02/10/2014  ? Pneumococcal Polysaccharide-23 06/10/2011  ? Zoster, Live 07/04/2009  ? ?Pertinent  Health Maintenance Due  ?Topic Date Due  ? INFLUENZA VACCINE  11/05/2021  ? ? ?  07/03/2021  ?  5:00 PM 07/03/2021  ?  7:16 PM 07/04/2021  ?  9:30 AM 07/04/2021  ?  7:18 PM 07/05/2021  ?  7:15 AM  ?Fall Risk  ?Patient Fall Risk  Level Moderate fall risk Moderate fall risk Moderate fall risk Moderate fall risk High fall risk  ? ?Functional Status Survey: ?  ? ?Vitals:  ? 08/12/21 0937  ?BP: (!) 100/52  ?Pulse: 79  ?Resp: 18

## 2021-08-15 ENCOUNTER — Ambulatory Visit (INDEPENDENT_AMBULATORY_CARE_PROVIDER_SITE_OTHER): Payer: Medicare Other | Admitting: Orthopedic Surgery

## 2021-08-15 DIAGNOSIS — Z89512 Acquired absence of left leg below knee: Secondary | ICD-10-CM

## 2021-08-15 DIAGNOSIS — S88112A Complete traumatic amputation at level between knee and ankle, left lower leg, initial encounter: Secondary | ICD-10-CM

## 2021-08-17 ENCOUNTER — Encounter: Payer: Self-pay | Admitting: Orthopedic Surgery

## 2021-08-17 NOTE — Progress Notes (Signed)
? ?Office Visit Note ?  ?Patient: Greg [name]           ?Date of Birth: [birth year]           ?MRN: [id number] ?Visit Date: 08/15/2021 ?             ?Requested by: Virgie Dad, MD ?[address] ?[place],  [place] [address] ?PCP: Virgie Dad, MD ? ?Chief Complaint  ?Patient presents with  ? Left Leg - Routine Post Op  ?  07/03/21 left BKA   ? ? ? ? ?HPI: ?Patient is an 88 year old gentleman who presents status post left transtibial amputation.  Patient is working with Office manager.  He is now on a 3 XL shrinker. ? ?Assessment & Plan: ?Visit Diagnoses:  ?1. Below-knee amputation of left lower extremity ([place])   ? ? ?Plan: Discussed the importance of working on knee extension he is still working with therapy. ? ?Follow-Up Instructions: Return in about 4 weeks (around 09/12/2021).  ? ?Ortho Exam ? ?Patient is alert, oriented, no adenopathy, well-dressed, normal affect, normal respiratory effort. ?Examination the incision is well-healed there is still some swelling.  Recommended decreasing the shrinker size. ? ?Imaging: ?No results found. ?No images are attached to the encounter. ? ?Labs: ?Lab Results  ?Component Value Date  ? HGBA1C 5.8 (H) 07/03/2021  ? HGBA1C 5.8 05/23/2021  ? HGBA1C 5.9 (H) 10/08/2020  ? ESRSEDRATE 55 (H) 06/20/2021  ? ESRSEDRATE 75 (H) 03/22/2021  ? ESRSEDRATE 48 02/14/2021  ? CRP 31.9 (H) 06/20/2021  ? CRP 151.2 (H) 03/22/2021  ? CRP 16.8 01/15/2021  ? ? ? ?Lab Results  ?Component Value Date  ? ALBUMIN 3.2 (A) 07/08/2021  ? ALBUMIN 3.4 (L) 07/03/2021  ? ALBUMIN 2.9 (A) 12/27/2020  ? PREALBUMIN 18.5 07/03/2021  ? ? ?Lab Results  ?Component Value Date  ? MG 2.2 07/03/2021  ? MG 2.1 10/10/2020  ? ?Lab Results  ?Component Value Date  ? VD25OH 28.44 (L) 07/03/2021  ? VD25OH 30.55 10/09/2020  ? ? ?Lab Results  ?Component Value Date  ? PREALBUMIN 18.5 07/03/2021  ? ? ?  Latest Ref Rng & Units 07/25/2021  ? 12:00 AM 07/18/2021  ? 12:00 AM 07/05/2021  ?  3:02 AM  ?CBC EXTENDED  ?WBC  6.0      5.5       6.9    ?RBC 3.87 - 5.11 3.49      3.33      2.85    ?Hemoglobin 13.5 - 17.5 10.3      9.8      8.3    ?HCT 41 - 53 32      30      25.6    ?Platelets 150 - 400 K/uL 242      276      198    ?NEUT#   3,471.00        ?  ? This result is from an external source.  ? ? ? ?There is no height or weight on file to calculate BMI. ? ?Orders:  ?No orders of the defined types were placed in this encounter. ? ?No orders of the defined types were placed in this encounter. ? ? ? Procedures: ?No procedures performed ? ?Clinical Data: ?No additional findings. ? ?ROS: ? ?All other systems negative, except as noted in the HPI. ?Review of Systems ? ?Objective: ?Vital Signs: There were no vitals taken for this visit. ? ?Specialty Comments:  ?No specialty comments  available. ? ?PMFS History: ?Patient Active Problem List  ? Diagnosis Date Noted  ? Below-knee amputation of left lower extremity ([place]) 07/03/2021  ? Vitamin B12 deficiency 03/05/2021  ? Candida rash of groin 03/05/2021  ? (HFpEF) heart failure with preserved ejection fraction ([place]) 02/04/2021  ? Acute osteomyelitis of left calcaneus ([place]) 01/11/2021  ? MRSA infection 01/11/2021  ? CKD (chronic kidney disease) stage 3, GFR 30-59 ml/min (HCC) 01/08/2021  ? DVT of deep femoral vein, right ([place]) 12/22/2020  ? Pressure ulcer, unstageable ([place]) 11/12/2020  ? Hyponatremia 10/15/2020  ? Prediabetes 10/15/2020  ? HTN (hypertension) 10/15/2020  ? Slow transit constipation 10/15/2020  ? Blood loss anemia 10/15/2020  ? Closed displaced fracture of right acetabulum ([place])   ? Closed right hip fracture ([place]) 10/08/2020  ? Parkinson's disease ([place]) 09/25/2020  ? BPH (benign prostatic hyperplasia) 09/25/2020  ? Edema 09/25/2020  ? Osteoarthritis, multiple sites 09/25/2020  ? Osteoporosis 09/25/2020  ? Hyperlipidemia 09/25/2020  ? ?Past Medical History:  ?Diagnosis Date  ? Acute bronchiolitis   ? Acute osteomyelitis of calcaneum, left ([place]) 01/11/2021  ? Anemia   ? Bursitis of both hips   ?  Complication of anesthesia   ? wife states pt was a little "loopy" for several days  ? Dementia ([place])   ? due to Parkinson's  ? Hearing reduced   ? hearing aid  ? History of basal cell carcinoma   ? Hyperlipidemia   ? Malaise and fatigue   ? MRSA infection 01/11/2021  ? Osteoarthritis   ? hand  ? Osteoporosis   ? Palpitation   ? Parkinson's disease ([place])   ? Peripheral vascular disease ([place])   ? blood clots  ? Pre-diabetes   ? Vitamin D deficiency   ?  ?Family History  ?Problem Relation Age of Onset  ? Prostate cancer Father   ? Skin cancer Brother   ?  ?Past Surgical History:  ?Procedure Laterality Date  ? ABDOMINAL AORTOGRAM W/LOWER EXTREMITY N/A 06/12/2021  ? Procedure: ABDOMINAL AORTOGRAM W/LOWER EXTREMITY;  Surgeon: Wellington Hampshire, MD;  Location: [facility];  Service: Cardiovascular;  Laterality: N/A;  ? AMPUTATION Left 07/03/2021  ? Procedure: LEFT BELOW KNEE AMPUTATION;  Surgeon: Newt Minion, MD;  Location: [facility];  Service: Orthopedics;  Laterality: Left;  ? CATARACT EXTRACTION  05/31/2015  ? Eye/right  ? COLONOSCOPY  02/05/2005, 03/06/2005  ? normal, 10 years ago  ? MOHS micrographic surgery    ? Face surgery  ? ORIF ACETABULAR FRACTURE Right 10/09/2020  ? Procedure: OPEN REDUCTION INTERNAL FIXATION (ORIF) ACETABULAR FRACTURE;  Surgeon: Altamese Trion, MD;  Location: [facility];  Service: Orthopedics;  Laterality: Right;  ? ?Social History  ? ?Occupational History  ? Occupation: retired  ?  Comment: art professor  ?Tobacco Use  ? Smoking status: Never  ? Smokeless tobacco: Never  ?Vaping Use  ? Vaping Use: Never used  ?Substance and Sexual Activity  ? Alcohol use: Never  ? Drug use: Never  ? Sexual activity: Not on file  ? ? ? ? ? ?

## 2021-09-12 ENCOUNTER — Ambulatory Visit (INDEPENDENT_AMBULATORY_CARE_PROVIDER_SITE_OTHER): Payer: Medicare Other | Admitting: Orthopedic Surgery

## 2021-09-12 ENCOUNTER — Encounter: Payer: Self-pay | Admitting: Orthopedic Surgery

## 2021-09-12 DIAGNOSIS — S88112A Complete traumatic amputation at level between knee and ankle, left lower leg, initial encounter: Secondary | ICD-10-CM

## 2021-09-12 NOTE — Progress Notes (Signed)
Office Visit Note   Patient: Greg [name]           Date of Birth: [birth year]           MRN: [id number] Visit Date: 09/12/2021              Requested by: Virgie Dad, MD [address] [place],   [address] PCP: Virgie Dad, MD  Chief Complaint  Patient presents with   Left Leg - Routine Post Op    07/03/21 left BKA      HPI: Patient is an 88 year old gentleman who is 9 weeks status post left transtibial amputation.  Assessment & Plan: Visit Diagnoses:  1. Below-knee amputation of left lower extremity ([place])     Plan: Patient will follow-up with [facility] tomorrow for casting for a prosthesis.  Plan to follow-up in about 6 weeks.  Patient is given a prescription for [facility] [place] to work on Personnel officer after his prosthesis is available and to continue working on knee extension.  Follow-Up Instructions: Return in about 4 weeks (around 10/10/2021).   Ortho Exam  Patient is alert, oriented, no adenopathy, well-dressed, normal affect, normal respiratory effort. Examination patient lacks about 20 degrees to full extension on the left knee.  There is no redness no ulcers no cellulitis or swelling in the residual limb.  Imaging: No results found. No images are attached to the encounter.  Labs: Lab Results  Component Value Date   HGBA1C 5.8 (H) 07/03/2021   HGBA1C 5.8 05/23/2021   HGBA1C 5.9 (H) 10/08/2020   ESRSEDRATE 55 (H) 06/20/2021   ESRSEDRATE 75 (H) 03/22/2021   ESRSEDRATE 48 02/14/2021   CRP 31.9 (H) 06/20/2021   CRP 151.2 (H) 03/22/2021   CRP 16.8 01/15/2021     Lab Results  Component Value Date   ALBUMIN 3.2 (A) 07/08/2021   ALBUMIN 3.4 (L) 07/03/2021   ALBUMIN 2.9 (A) 12/27/2020   PREALBUMIN 18.5 07/03/2021    Lab Results  Component Value Date   MG 2.2 07/03/2021   MG 2.1 10/10/2020   Lab Results  Component Value Date   VD25OH 28.44 (L) 07/03/2021   VD25OH 30.55 10/09/2020    Lab Results  Component Value Date    PREALBUMIN 18.5 07/03/2021      Latest Ref Rng & Units 07/25/2021   12:00 AM 07/18/2021   12:00 AM 07/05/2021    3:02 AM  CBC EXTENDED  WBC  6.0     5.5     6.9   RBC 3.87 - 5.11 3.49     3.33     2.85   Hemoglobin 13.5 - 17.5 10.3     9.8     8.3   HCT 41 - 53 32     30     25.6   Platelets 150 - 400 K/uL 242     276     198   NEUT#   3,471.00          This result is from an external source.     There is no height or weight on file to calculate BMI.  Orders:  No orders of the defined types were placed in this encounter.  No orders of the defined types were placed in this encounter.    Procedures: No procedures performed  Clinical Data: No additional findings.  ROS:  All other systems negative, except as noted in the HPI. Review of Systems  Objective: Vital Signs: There were  no vitals taken for this visit.  Specialty Comments:  No specialty comments available.  PMFS History: Patient Active Problem List   Diagnosis Date Noted   Below-knee amputation of left lower extremity ([place]) 07/03/2021   Vitamin B12 deficiency 03/05/2021   Candida rash of groin 03/05/2021   (HFpEF) heart failure with preserved ejection fraction ([place]) 02/04/2021   Acute osteomyelitis of left calcaneus ([place]) 01/11/2021   MRSA infection 01/11/2021   CKD (chronic kidney disease) stage 3, GFR 30-59 ml/min ([place]) 01/08/2021   DVT of deep femoral vein, right ([place]) 12/22/2020   Pressure ulcer, unstageable ([place]) 11/12/2020   Hyponatremia 10/15/2020   Prediabetes 10/15/2020   HTN (hypertension) 10/15/2020   Slow transit constipation 10/15/2020   Blood loss anemia 10/15/2020   Closed displaced fracture of right acetabulum [facility])    Closed right hip fracture ([place]) 10/08/2020   Parkinson's disease ([place]) 09/25/2020   BPH (benign prostatic hyperplasia) 09/25/2020   Edema 09/25/2020   Osteoarthritis, multiple sites 09/25/2020   Osteoporosis 09/25/2020   Hyperlipidemia 09/25/2020   Past Medical  History:  Diagnosis Date   Acute bronchiolitis    Acute osteomyelitis of calcaneum, left ([place]) 01/11/2021   Anemia    Bursitis of both hips    Complication of anesthesia    wife states pt was a little "loopy" for several days   Dementia ([place])    due to Parkinson's   Hearing reduced    hearing aid   History of basal cell carcinoma    Hyperlipidemia    Malaise and fatigue    MRSA infection 01/11/2021   Osteoarthritis    hand   Osteoporosis    Palpitation    Parkinson's disease ([place])    Peripheral vascular disease ([place])    blood clots   Pre-diabetes    Vitamin D deficiency     Family History  Problem Relation Age of Onset   Prostate cancer Father    Skin cancer Brother     Past Surgical History:  Procedure Laterality Date   ABDOMINAL AORTOGRAM W/LOWER EXTREMITY N/A 06/12/2021   Procedure: ABDOMINAL AORTOGRAM W/LOWER EXTREMITY;  Surgeon: Wellington Hampshire, MD;  Location: [facility];  Service: Cardiovascular;  Laterality: N/A;   AMPUTATION Left 07/03/2021   Procedure: LEFT BELOW KNEE AMPUTATION;  Surgeon: Newt Minion, MD;  Location: [facility];  Service: Orthopedics;  Laterality: Left;   CATARACT EXTRACTION  05/31/2015   Eye/right   COLONOSCOPY  02/05/2005, 03/06/2005   normal, 10 years ago   MOHS micrographic surgery     Face surgery   ORIF ACETABULAR FRACTURE Right 10/09/2020   Procedure: OPEN REDUCTION INTERNAL FIXATION (ORIF) ACETABULAR FRACTURE;  Surgeon: Altamese , MD;  Location: [facility];  Service: Orthopedics;  Laterality: Right;   Social History   Occupational History   Occupation: retired    Comment: Engineer, site professor  Tobacco Use   Smoking status: Never   Smokeless tobacco: Never  Vaping Use   Vaping Use: Never used  Substance and Sexual Activity   Alcohol use: Never   Drug use: Never   Sexual activity: Not on file

## 2021-09-18 ENCOUNTER — Non-Acute Institutional Stay (SKILLED_NURSING_FACILITY): Payer: Medicare Other | Admitting: Orthopedic Surgery

## 2021-09-18 ENCOUNTER — Encounter: Payer: Self-pay | Admitting: Orthopedic Surgery

## 2021-09-18 DIAGNOSIS — R7303 Prediabetes: Secondary | ICD-10-CM

## 2021-09-18 DIAGNOSIS — I82411 Acute embolism and thrombosis of right femoral vein: Secondary | ICD-10-CM | POA: Diagnosis not present

## 2021-09-18 DIAGNOSIS — G20 Parkinson's disease: Secondary | ICD-10-CM

## 2021-09-18 DIAGNOSIS — N492 Inflammatory disorders of scrotum: Secondary | ICD-10-CM

## 2021-09-18 DIAGNOSIS — D500 Iron deficiency anemia secondary to blood loss (chronic): Secondary | ICD-10-CM

## 2021-09-18 DIAGNOSIS — N1832 Chronic kidney disease, stage 3b: Secondary | ICD-10-CM

## 2021-09-18 DIAGNOSIS — R600 Localized edema: Secondary | ICD-10-CM

## 2021-09-18 DIAGNOSIS — M810 Age-related osteoporosis without current pathological fracture: Secondary | ICD-10-CM

## 2021-09-18 DIAGNOSIS — I10 Essential (primary) hypertension: Secondary | ICD-10-CM

## 2021-09-18 DIAGNOSIS — E538 Deficiency of other specified B group vitamins: Secondary | ICD-10-CM

## 2021-09-18 DIAGNOSIS — K5903 Drug induced constipation: Secondary | ICD-10-CM

## 2021-09-18 DIAGNOSIS — Z89512 Acquired absence of left leg below knee: Secondary | ICD-10-CM

## 2021-09-18 NOTE — Progress Notes (Signed)
Location:  [facility] Room Number: N27/A Place of Service:  SNF [phone]) Provider: Yvonna Alanis, NP   Patient Care Team: Virgie Dad, MD as PCP - General (Internal Medicine) [name], Eustace Quail, DO as Consulting Physician (Neurology)  Extended Emergency Contact Information Primary Emergency Contact: Murch,jane Address: [place].          apt [id number] Johnnette Litter of [place] Phone: [phone] Mobile Phone: [phone] Relation: Spouse Secondary Emergency Contact: Arline,kenneth Home Phone: [phone] Mobile Phone: [phone] Relation: Son  Code Status:  Full Code Goals of care: Advanced Directive information    09/18/2021    1:19 PM  Advanced Directives  Does Patient Have a Medical Advance Directive? Yes  Type of Advance Directive Living will  Does patient want to make changes to medical advance directive? No - Patient declined     Chief Complaint  Patient presents with   Medical Management of Chronic Issues    Routine visit.   Quality Metric Gaps    Discuss the need for Shingrix vaccine and additional Covid booster, or post pone if patient refuses.     HPI:  Pt is a 88 y.o. male seen today for medical management of chronic diseases.    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 Sharol Given due to ongoing osteomyelitis (+MRSA) of left heel, doing well with PT/OT, denies phantom pain, working with [facility] for [facility], remains on Eliquis for DVT prophylaxis BLE- LV EF 60-65% 02/20/2021, improved since amputation, unable to tolerate aggressive diuresis in past, pitting edema BLE L>R, remains on torsemide  DVT of deep femoral vein- diagnosed 12/2020, remains on Eliquis Parkinson's-  diagnosed 2019, followed by neurology, remains on sinemet HTN- BUN/creat 34/1.1 07/18/2021, coreg discontinued  due to hypotension CKD 3b- see above Prediabetes- - A1c 5.8 07/03/2021 Anemia- hgb 10.3 07/25/2021 , remains on ferrous sulfate M/W/F Vitamin B12 deficiency- B12 205 03/04/2021, remains on cyanocobalamin daily Osteoporosis- ORIF right hip 10/10/2020, stopped fosamax 2018 (6 years use), remains on calcium and vitamin D supplements Constipation- LBM 06/14, remains on miralax and senna  No recent falls or injuries. Ambulates with wheelchair.   Recent blood pressures:  06/13- 139/74  06/06- 137/74  05/30- 99/55  Recent weights:  06/01- 175.9 lbs  05/01- 176.6 lbs  04/03- 181.3 lbs      Past Medical History:  Diagnosis Date   Acute bronchiolitis    Acute osteomyelitis of calcaneum, left ([place]) 01/11/2021   Anemia    Bursitis of both hips    Complication of anesthesia    wife states pt was a little "loopy" for several days   Dementia ([place])    due to Parkinson's   Hearing reduced    hearing aid   History of basal cell carcinoma    Hyperlipidemia    Malaise and fatigue    MRSA infection 01/11/2021   Osteoarthritis    hand   Osteoporosis    Palpitation    Parkinson's disease ([place])    Peripheral vascular disease ([place])    blood clots   Pre-diabetes    Vitamin D deficiency    Past Surgical History:  Procedure Laterality Date   ABDOMINAL AORTOGRAM W/LOWER EXTREMITY N/A 06/12/2021   Procedure: ABDOMINAL AORTOGRAM W/LOWER EXTREMITY;  Surgeon: Wellington Hampshire, MD;  Location: [facility];  Service: Cardiovascular;  Laterality: N/A;   AMPUTATION Left 07/03/2021   Procedure: LEFT BELOW KNEE AMPUTATION;  Surgeon: Nadara Mustard, MD;  Location: [facility] OR;  Service: Orthopedics;  Laterality: Left;   CATARACT EXTRACTION  05/31/2015   Eye/right   COLONOSCOPY  02/05/2005, 03/06/2005   normal, 10 years ago   MOHS micrographic surgery     Face surgery   ORIF ACETABULAR FRACTURE Right 10/09/2020   Procedure: OPEN REDUCTION INTERNAL FIXATION (ORIF) ACETABULAR FRACTURE;  Surgeon: [name], MD;  Location: [facility] OR;  Service: Orthopedics;  Laterality: Right;    No Known Allergies  Outpatient Encounter Medications as of 09/18/2021  Medication Sig   acetaminophen (TYLENOL) 325 MG tablet Take 650 mg by mouth every 6 (six) hours as needed (Pain score 1-3 or temp>100.5.).   albuterol (VENTOLIN HFA) 108 (90 Base) MCG/ACT inhaler Inhale 2 puffs into the lungs every 4 (four) hours as needed for wheezing or shortness of breath.   Amino Acids-Protein Hydrolys (FEEDING SUPPLEMENT, PRO-STAT SUGAR FREE 64,) LIQD Take 30 mLs by mouth in the morning and at bedtime.   apixaban (ELIQUIS) 5 MG TABS tablet Take 5 mg by mouth 2 (two) times daily.   calcium carbonate (OSCAL) 1500 (600 Ca) MG TABS tablet Take 600 mg of elemental calcium by mouth daily with breakfast.   carbidopa-levodopa (SINEMET CR) 50-200 MG tablet Take 1 tablet by mouth at bedtime.   carbidopa-levodopa (SINEMET IR) 25-100 MG tablet Take 1 tablet by mouth in the morning, at noon, in the evening, and at bedtime. 06:00 AM, 12:00 PM, 06:00 PM, 12:00 AM   ferrous sulfate 325 (65 FE) MG tablet Take 325 mg by mouth every Monday, Wednesday, and Friday.   Multiple Vitamins-Minerals (PRESERVISION AREDS 2+MULTI VIT) CAPS Take 1 capsule by mouth in the morning and at bedtime.   oxyCODONE-acetaminophen (PERCOCET/ROXICET) 5-325 MG tablet Take 1 tablet by mouth every 4 (four) hours as needed.   polyethylene glycol (MIRALAX / GLYCOLAX) 17 g packet Take 17 g by mouth every other day.   senna (SENOKOT) 8.6 MG tablet Take 1 tablet by mouth daily.   torsemide (DEMADEX) 20 MG tablet Take 20 mg by mouth daily.   vitamin B-12 (CYANOCOBALAMIN) 1000 MCG tablet Take 1,000 mcg by mouth daily.   Vitamin D, Cholecalciferol, 25 MCG (1000 UT) TABS Take 1,000 Units by mouth daily.   zinc oxide 20 % ointment Apply 1 application. topically See admin instructions. To buttocks after every incontinent episode and as needed for redness.   No facility-administered  encounter medications on file as of 09/18/2021.    Review of Systems  Constitutional:  Negative for activity change, appetite change, chills, fatigue and fever.  HENT:  Negative for congestion and trouble swallowing.   Eyes:  Negative for visual disturbance.  Respiratory:  Negative for cough, shortness of breath and wheezing.   Cardiovascular:  Positive for leg swelling. Negative for chest pain.  Gastrointestinal:  Positive for constipation. Negative for abdominal pain, blood in stool, diarrhea, nausea and vomiting.  Genitourinary:  Negative for dysuria, frequency, hematuria, scrotal swelling and testicular pain.  Musculoskeletal:  Positive for arthralgias and gait problem.  Skin:  Negative for wound.  Neurological:  Positive for tremors and weakness. Negative for dizziness and headaches.  Psychiatric/Behavioral:  Negative for confusion, dysphoric mood and sleep disturbance. The patient is not nervous/anxious.     Immunization History  Administered Date(s) Administered   H1N1 06/13/2008   Influenza Split 03/05/2006, 02/26/2007, 01/26/2008, 01/18/2009, 12/12/2009, 01/16/2011, 01/29/2012, 02/01/2013, 02/08/2014, 02/13/2015,  01/10/2016, 02/23/2017, 02/24/2018, 01/24/2019, 12/22/2019   Influenza, High Dose Seasonal PF 01/30/2021   PFIZER(Purple Top)SARS-COV-2 Vaccination 05/04/2019, 05/25/2019, 09/01/2020   Pfizer Covid-19 Vaccine Bivalent Booster 28yrs & up 12/27/2019, 12/26/2020   Pneumococcal Conjugate-13 02/10/2014   Pneumococcal Polysaccharide-23 06/10/2011   Zoster, Live 07/04/2009   Pertinent  Health Maintenance Due  Topic Date Due   INFLUENZA VACCINE  11/05/2021      07/03/2021    5:00 PM 07/03/2021    7:16 PM 07/04/2021    9:30 AM 07/04/2021    7:18 PM 07/05/2021    7:15 AM  Fall Risk  Patient Fall Risk Level Moderate fall risk Moderate fall risk Moderate fall risk Moderate fall risk High fall risk   Functional Status Survey:    Vitals:   09/18/21 1314  BP: 139/74   Pulse: 86  Resp: 20  Temp: (!) 97.5 F (36.4 C)  SpO2: 95%  Weight: 175 lb 14.4 oz (79.8 kg)  Height: 5\' 9"  (1.753 m)   Body mass index is 25.98 kg/m. Physical Exam Vitals reviewed.  Constitutional:      General: He is not in acute distress. HENT:     Head: Normocephalic.     Right Ear: There is no impacted cerumen.     Left Ear: There is no impacted cerumen.     Nose: Nose normal.     Mouth/Throat:     Mouth: Mucous membranes are moist.  Eyes:     General:        Right eye: No discharge.        Left eye: No discharge.  Cardiovascular:     Rate and Rhythm: Normal rate and regular rhythm.     Pulses: Normal pulses.     Heart sounds: Murmur heard.  Pulmonary:     Effort: Pulmonary effort is normal. No respiratory distress.     Breath sounds: Normal breath sounds. No wheezing or rales.  Abdominal:     General: Bowel sounds are normal. There is no distension.     Palpations: Abdomen is soft.     Tenderness: There is no abdominal tenderness.  Genitourinary:    Comments: Approx 2-3 cm firm nodule to posterior scrotum, feel rubbery, patient notes some tenderness during exam, no skin breakdown.  Musculoskeletal:     Cervical back: Neck supple.     Right lower leg: Edema present.     Comments: Non pitting, left BKA  Skin:    General: Skin is warm and dry.     Capillary Refill: Capillary refill takes less than 2 seconds.  Neurological:     General: No focal deficit present.     Mental Status: He is alert and oriented to person, place, and time.     Motor: Weakness present.     Gait: Gait abnormal.     Comments: Wheelchair, resting hand tremor  Psychiatric:        Mood and Affect: Mood normal.        Behavior: Behavior normal.     Labs reviewed: Recent Labs    10/09/20 0546 10/09/20 0937 10/10/20 0607 10/11/20 0037 10/12/20 0146 10/25/20 0000 06/04/21 0957 06/20/21 1120 07/03/21 0913 07/08/21 0000 07/18/21 0000  NA  --    < > 131*   < > 132*   < > 138 136  136 139 137  K  --    < > 4.2   < > 4.1   < > 4.4 4.6 4.4 4.2 4.5  CL  --    < >  97*   < > 97*   < > 97 99 102 102 100  CO2  --    < > 28   < > 28   < > 29 27 26  29* 32*  GLUCOSE  --    < > 134*   < > 122*   < > 83 121* 96  --   --   BUN  --    < > 23   < > 24*   < > 35* 32* 34* 35* 34*  CREATININE  --    < > 1.20   < > 1.14   < > 1.49* 1.51* 1.49* 1.1 1.1  CALCIUM  --    < > 8.7*   < > 8.4*   < > 8.9 9.1 9.0 8.5* 8.9  MG  --   --  2.1  --   --   --   --   --  2.2  --   --   PHOS 3.4  --   --   --  3.5  --   --   --   --   --   --    < > = values in this interval not displayed.   Recent Labs    10/11/20 0037 10/12/20 0146 12/27/20 0000 06/20/21 1120 07/03/21 0913 07/08/21 0000  AST 34   < > 18 12 14* 12*  ALT <5   < > 6* 6* 6 3*  ALKPHOS 24*   < > 46  --  48 45  BILITOT 1.1  --   --  0.6 0.5  --   PROT 4.6*  --   --  6.9 6.9  --   ALBUMIN 2.6*   < > 2.9*  --  3.4* 3.2*   < > = values in this interval not displayed.   Recent Labs    06/20/21 1120 07/03/21 1618 07/04/21 0128 07/05/21 0302 07/18/21 0000 07/25/21 0000  WBC 7.1 7.4 8.8 6.9 5.5 6.0  NEUTROABS 5,375 6.6  --   --  3,471.00  --   HGB 10.9* 9.5* 7.6* 8.3* 9.8* 10.3*  HCT 33.9* 29.6* 24.0* 25.6* 30* 32*  MCV 91.1 91.9 92.0 89.8  --   --   PLT 234 207 179 198 276 242   No results found for: "TSH" Lab Results  Component Value Date   HGBA1C 5.8 (H) 07/03/2021   No results found for: "CHOL", "HDL", "LDLCALC", "LDLDIRECT", "TRIG", "CHOLHDL"  Significant Diagnostic Results in last 30 days:  No results found.  Assessment/Plan 1. Scrotal nodule - approx 2-3 cm firm, rubbery nodule palpated - first noticed by CNA during care 06/14 - will order ultrasound of scrotum  2. S/P below knee amputation, left ([place]) -  surgery 03/29 by Dr. Sharol Given due to ongoing osteomyelitis (+MRSA) of left heel - working with [facility] for prothesis - cont PT/OT  3. Bilateral leg edema - non pitting - weights stable - cannot  tolerate aggressive diuresis - cont torsemide  4. DVT of deep femoral vein, right ([facility]) - cont Eliquis- started 12/2020 - plan to discontinue when more ambulatory  5. Parkinson's disease ([place]) - cont sinemet  6. Primary hypertension - controlled - coreg d/c due to hypotension  7. Stage 3b chronic kidney disease ([place]) - avoid nephrotoxic drugs like NSAIDS and dose adjust medications to be renally excreted - encourage hydration with water  8. Prediabetes - A1c stable  9. Blood loss anemia - hgb stable -  cont ferrous sulfate  10. Age-related osteoporosis without current pathological fracture - cont calcium and vitamin D  11. Vitamin B12 deficiency - cont B12 supplement  12. Drug-induced constipation - abdomen soft - cont miralax and senna    Family/ staff Communication: plan discussed with patient, wife and nurse  Labs/tests ordered:  ultrasound of scrotum

## 2021-09-23 ENCOUNTER — Non-Acute Institutional Stay (SKILLED_NURSING_FACILITY): Payer: Medicare Other | Admitting: Orthopedic Surgery

## 2021-09-23 ENCOUNTER — Encounter: Payer: Self-pay | Admitting: Orthopedic Surgery

## 2021-09-23 DIAGNOSIS — I82411 Acute embolism and thrombosis of right femoral vein: Secondary | ICD-10-CM

## 2021-09-23 DIAGNOSIS — N4889 Other specified disorders of penis: Secondary | ICD-10-CM | POA: Diagnosis not present

## 2021-09-23 DIAGNOSIS — N1832 Chronic kidney disease, stage 3b: Secondary | ICD-10-CM

## 2021-09-23 DIAGNOSIS — D500 Iron deficiency anemia secondary to blood loss (chronic): Secondary | ICD-10-CM

## 2021-09-23 DIAGNOSIS — Z89512 Acquired absence of left leg below knee: Secondary | ICD-10-CM

## 2021-09-23 DIAGNOSIS — N492 Inflammatory disorders of scrotum: Secondary | ICD-10-CM

## 2021-09-23 DIAGNOSIS — G20 Parkinson's disease: Secondary | ICD-10-CM

## 2021-09-23 DIAGNOSIS — G20A1 Parkinson's disease without dyskinesia, without mention of fluctuations: Secondary | ICD-10-CM

## 2021-09-23 DIAGNOSIS — R600 Localized edema: Secondary | ICD-10-CM | POA: Diagnosis not present

## 2021-09-23 DIAGNOSIS — I10 Essential (primary) hypertension: Secondary | ICD-10-CM

## 2021-09-23 DIAGNOSIS — R7303 Prediabetes: Secondary | ICD-10-CM

## 2021-09-23 MED ORDER — DOXYCYCLINE HYCLATE 100 MG PO TABS: 100.0000 mg | ORAL_TABLET | Freq: Two times a day (BID) | ORAL | 0 refills | Status: AC

## 2021-09-23 MED ORDER — TORSEMIDE 10 MG PO TABS: 10.0000 mg | ORAL_TABLET | Freq: Every day | ORAL | 0 refills | Status: DC

## 2021-09-23 NOTE — Progress Notes (Signed)
Location:  [facility] Room Number: N27/A Place of Service:  SNF [phone]) Provider:  Octavia Heir, NP   [name], MD  Patient Care Team: [name], MD as PCP - General (Internal Medicine) [name], Octaviano Batty, DO as Consulting Physician (Neurology)  Extended Emergency Contact Information Primary Emergency Contact: Strout,jane Address: [address] [place].          apt [phone] Darden Amber of [place] Home Phone: [phone] Mobile Phone: [phone] Relation: Spouse Secondary Emergency Contact: Doleman,kenneth Home Phone: [phone] Mobile Phone: [phone] Relation: Son  Code Status:  Full code Goals of care: Advanced Directive information    09/18/2021    1:19 PM  Advanced Directives  Does Patient Have a Medical Advance Directive? Yes  Type of Advance Directive Living will  Does patient want to make changes to medical advance directive? No - Patient declined     Chief Complaint  Patient presents with   Acute Visit    Penile edema, scrotal nodule    HPI:  Pt is a 88 y.o. male seen today for acute visit due to penile edema and scrotal nodule.   06/14 he was noted to have scrotal nodule. 06/18 ultrasound scrotum unremarkable. Since ultrasound, the skin over nodule has become opened and bleeding often. He reports intermittent burning sensation. Pain relieved by tylenol prn.   Today, he noticed increased penile edema over shaft portion. He denies pain, but reports mild discomfort. He is not having trouble urinating. He is able to retract shaft without difficulty. He is on low dose furosemide due to chronic lower leg edema. He is unable to tolerate high doses of diuretics in past.   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 Lajoyce Corners due to ongoing osteomyelitis (+MRSA) of left heel, doing well with PT/OT, denies phantom pain, working with [facility] for prosthesis, remains on Eliquis for DVT prophylaxis BLE- LV EF 60-65% 02/20/2021, improved  since amputation, unable to tolerate aggressive diuresis in past, pitting edema BLE L>R, remains on torsemide  DVT of deep femoral vein- diagnosed 12/2020, remains on Eliquis Parkinson's-  diagnosed 2019, followed by neurology, remains on sinemet HTN- BUN/creat 34/1.1 07/18/2021, coreg discontinued due to hypotension CKD 3b- see above Prediabetes- - A1c 5.8 07/03/2021 Anemia- hgb 10.3 07/25/2021 , remains on ferrous sulfate M/W/F  Past Medical History:  Diagnosis Date   Acute bronchiolitis    Acute osteomyelitis of calcaneum, left (HCC) 01/11/2021   Anemia    Bursitis of both hips    Complication of anesthesia    wife states pt was a little "loopy" for several days   Dementia (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   Hearing reduced    hearing aid   History of basal cell carcinoma    Hyperlipidemia    Malaise and fatigue    MRSA infection 01/11/2021   Osteoarthritis    hand   Osteoporosis    Palpitation    Parkinson's disease (HCC)    Peripheral vascular disease (HCC)    blood clots   Pre-diabetes    Vitamin D deficiency    Past Surgical History:  Procedure Laterality Date   ABDOMINAL AORTOGRAM W/LOWER EXTREMITY N/A 06/12/2021   Procedure: ABDOMINAL AORTOGRAM W/LOWER EXTREMITY;  Surgeon: Iran Ouch, MD;  Location: [facility] IN[facility]AS[facility];  Service: Cardiovascular;  Laterality: N/A;   AMPUTATION Left 07/03/2021   Procedure: LEFT BELOW KNEE AMPUTATION;  Surgeon: Nadara Mustard, MD;  [facility];  Service:  Orthopedics;  Laterality: Left;   CATARACT EXTRACTION  05/31/2015   Eye/right   COLONOSCOPY  02/05/2005, 03/06/2005   normal, 10 years ago   MOHS micrographic surgery     Face surgery   ORIF ACETABULAR FRACTURE Right 10/09/2020   Procedure: OPEN REDUCTION INTERNAL FIXATION (ORIF) ACETABULAR FRACTURE;  Surgeon: [name], MD;  Location: [facility] OR;  Service: Orthopedics;  Laterality: Right;    No Known Allergies  Outpatient Encounter Medications as of 09/23/2021  Medication Sig    acetaminophen (TYLENOL) 325 MG tablet Take 650 mg by mouth every 6 (six) hours as needed (Pain score 1-3 or temp>100.5.).   albuterol (VENTOLIN HFA) 108 (90 Base) MCG/ACT inhaler Inhale 2 puffs into the lungs every 4 (four) hours as needed for wheezing or shortness of breath.   Amino Acids-Protein Hydrolys (FEEDING SUPPLEMENT, PRO-STAT SUGAR FREE 64,) LIQD Take 30 mLs by mouth in the morning and at bedtime.   apixaban (ELIQUIS) 5 MG TABS tablet Take 5 mg by mouth 2 (two) times daily.   calcium carbonate (OSCAL) 1500 (600 Ca) MG TABS tablet Take 600 mg of elemental calcium by mouth daily with breakfast.   carbidopa-levodopa (SINEMET CR) 50-200 MG tablet Take 1 tablet by mouth at bedtime.   carbidopa-levodopa (SINEMET IR) 25-100 MG tablet Take 1 tablet by mouth in the morning, at noon, in the evening, and at bedtime. 06:00 AM, 12:00 PM, 06:00 PM, 12:00 AM   ferrous sulfate 325 (65 FE) MG tablet Take 325 mg by mouth every Monday, Wednesday, and Friday.   Multiple Vitamins-Minerals (PRESERVISION AREDS 2+MULTI VIT) CAPS Take 1 capsule by mouth in the morning and at bedtime.   oxyCODONE-acetaminophen (PERCOCET/ROXICET) 5-325 MG tablet Take 1 tablet by mouth every 4 (four) hours as needed.   polyethylene glycol (MIRALAX / GLYCOLAX) 17 g packet Take 17 g by mouth every other day.   senna (SENOKOT) 8.6 MG tablet Take 1 tablet by mouth daily.   torsemide (DEMADEX) 20 MG tablet Take 20 mg by mouth daily.   vitamin B-12 (CYANOCOBALAMIN) 1000 MCG tablet Take 1,000 mcg by mouth daily.   Vitamin D, Cholecalciferol, 25 MCG (1000 UT) TABS Take 1,000 Units by mouth daily.   zinc oxide 20 % ointment Apply 1 application. topically See admin instructions. To buttocks after every incontinent episode and as needed for redness.   No facility-administered encounter medications on file as of 09/23/2021.    Review of Systems  Constitutional:  Negative for activity change, appetite change, chills, fatigue and fever.   HENT:  Negative for congestion and trouble swallowing.   Eyes:  Negative for visual disturbance.  Respiratory:  Negative for cough, shortness of breath and wheezing.   Cardiovascular:  Positive for leg swelling. Negative for chest pain.  Gastrointestinal:  Positive for constipation. Negative for abdominal distention, abdominal pain, diarrhea, nausea and vomiting.  Genitourinary:  Negative for dysuria and frequency.  Musculoskeletal:  Positive for gait problem.  Skin:  Positive for wound.  Neurological:  Positive for tremors and weakness. Negative for dizziness and light-headedness.  Psychiatric/Behavioral:  Negative for confusion, dysphoric mood and sleep disturbance. The patient is not nervous/anxious.     Immunization History  Administered Date(s) Administered   H1N1 06/13/2008   Influenza Split 03/05/2006, 02/26/2007, 01/26/2008, 01/18/2009, 12/12/2009, 01/16/2011, 01/29/2012, 02/01/2013, 02/08/2014, 02/13/2015, 01/10/2016, 02/23/2017, 02/24/2018, 01/24/2019, 12/22/2019   Influenza, High Dose Seasonal PF 01/30/2021   PFIZER(Purple Top)SARS-COV-2 Vaccination 05/04/2019, 05/25/2019, 09/01/2020   Pfizer Covid-19 Vaccine Bivalent Booster 62yrs & up 12/27/2019, 12/26/2020  Pneumococcal Conjugate-13 02/10/2014   Pneumococcal Polysaccharide-23 06/10/2011   Zoster, Live 07/04/2009   Pertinent  Health Maintenance Due  Topic Date Due   INFLUENZA VACCINE  11/05/2021      07/03/2021    5:00 PM 07/03/2021    7:16 PM 07/04/2021    9:30 AM 07/04/2021    7:18 PM 07/05/2021    7:15 AM  Fall Risk  Patient Fall Risk Level Moderate fall risk Moderate fall risk Moderate fall risk Moderate fall risk High fall risk   Functional Status Survey:    Vitals:   09/23/21 1736  BP: 139/74  Pulse: 86  Resp: 20  Temp: 97.9 F (36.6 C)  SpO2: 96%  Weight: 175 lb 14.4 oz (79.8 kg)  Height: 5\' 9"  (1.753 m)   Body mass index is 25.98 kg/m. Physical Exam Vitals reviewed.  Constitutional:       General: He is not in acute distress. HENT:     Head: Normocephalic.  Eyes:     General:        Right eye: No discharge.        Left eye: No discharge.  Cardiovascular:     Rate and Rhythm: Normal rate and regular rhythm.     Pulses: Normal pulses.     Heart sounds: Murmur heard.  Pulmonary:     Effort: Pulmonary effort is normal. No respiratory distress.     Breath sounds: Normal breath sounds. No wheezing or rales.  Abdominal:     General: Bowel sounds are normal. There is no distension.     Palpations: Abdomen is soft.     Tenderness: There is no abdominal tenderness.  Genitourinary:    Comments: Shaft of penis mildly swollen, able to retract, no redness or discharge Musculoskeletal:     Cervical back: Neck supple.     Right lower leg: Edema present.     Comments: Non pitting, L BKA  Skin:    General: Skin is warm and dry.     Capillary Refill: Capillary refill takes less than 2 seconds.     Findings: Lesion present.     Comments: Approx 2-3 cm, firm, rubbery nodule palpated over posterior scrotum, skin over lesion now open with bloody drainage, tolerated exam well  Neurological:     General: No focal deficit present.     Mental Status: He is alert. Mental status is at baseline.     Motor: Weakness present.     Gait: Gait abnormal.     Comments: Wheelchair, hoyer  Psychiatric:        Mood and Affect: Mood normal.        Behavior: Behavior normal.     Labs reviewed: Recent Labs    10/09/20 0546 10/09/20 0937 10/10/20 0607 10/11/20 0037 10/12/20 0146 10/25/20 0000 06/04/21 0957 06/20/21 1120 07/03/21 0913 07/08/21 0000 07/18/21 0000  NA  --    < > 131*   < > 132*   < > 138 136 136 139 137  K  --    < > 4.2   < > 4.1   < > 4.4 4.6 4.4 4.2 4.5  CL  --    < > 97*   < > 97*   < > 97 99 102 102 100  CO2  --    < > 28   < > 28   < > 29 27 26  29* 32*  GLUCOSE  --    < > 134*   < >  122*   < > 83 121* 96  --   --   BUN  --    < > 23   < > 24*   < > 35* 32* 34* 35*  34*  CREATININE  --    < > 1.20   < > 1.14   < > 1.49* 1.51* 1.49* 1.1 1.1  CALCIUM  --    < > 8.7*   < > 8.4*   < > 8.9 9.1 9.0 8.5* 8.9  MG  --   --  2.1  --   --   --   --   --  2.2  --   --   PHOS 3.4  --   --   --  3.5  --   --   --   --   --   --    < > = values in this interval not displayed.   Recent Labs    10/11/20 0037 10/12/20 0146 12/27/20 0000 06/20/21 1120 07/03/21 0913 07/08/21 0000  AST 34   < > 18 12 14* 12*  ALT <5   < > 6* 6* 6 3*  ALKPHOS 24*   < > 46  --  48 45  BILITOT 1.1  --   --  0.6 0.5  --   PROT 4.6*  --   --  6.9 6.9  --   ALBUMIN 2.6*   < > 2.9*  --  3.4* 3.2*   < > = values in this interval not displayed.   Recent Labs    06/20/21 1120 07/03/21 1618 07/04/21 0128 07/05/21 0302 07/18/21 0000 07/25/21 0000  WBC 7.1 7.4 8.8 6.9 5.5 6.0  NEUTROABS 5,375 6.6  --   --  3,471.00  --   HGB 10.9* 9.5* 7.6* 8.3* 9.8* 10.3*  HCT 33.9* 29.6* 24.0* 25.6* 30* 32*  MCV 91.1 91.9 92.0 89.8  --   --   PLT 234 207 179 198 276 242   No results found for: "TSH" Lab Results  Component Value Date   HGBA1C 5.8 (H) 07/03/2021   No results found for: "CHOL", "HDL", "LDLCALC", "LDLDIRECT", "TRIG", "CHOLHDL"  Significant Diagnostic Results in last 30 days:  No results found.  Assessment/Plan 1. Scrotal nodule - 06/18 ultrasound scrotum unremarkable - ? Abscess versus nodule - skin over area now open and bloody - will start antibiotics due to location, risk of infection - doxycycline (VIBRA-TABS) 100 MG tablet; Take 1 tablet (100 mg total) by mouth 2 (two) times daily for 10 days.  Dispense: 20 tablet; Refill: 0  2. Penile edema - shaft mildly swollen - will add torsemide 10 mg po daily x 2 days - torsemide (DEMADEX) 10 MG tablet; Take 1 tablet (10 mg total) by mouth daily for 2 days.  Dispense: 2 tablet; Refill: 0  3. S/P below knee amputation, left ([place]) -  surgery 03/29 by Dr. Sharol Given due to ongoing osteomyelitis (+MRSA) of left heel - no phantom  pain - working with [facility] for prothesis - cont PT/OT   4. Bilateral leg edema - non pitting to RLE - unable to tolerate aggressive diuresis - cont torsemide  5. DVT of deep femoral vein, right (HCC) - cont Eliquis- started 12/2020 - plan to discontinue when more ambulatory   6. Parkinson's disease ([place]) - cont sinemet - cont skilled nursing care  7. Primary hypertension - controlled without medication - coreg d/c due to hypotension  8. Stage 3b  chronic kidney disease ([place]) - avoid nephrotoxic drugs like NSAIDS and dose adjust medications to be renally excreted - encourage hydration with water   9. Prediabetes - A1c stable  10. Blood loss anemia - hgb stable - cont ferrous sulfate    Family/ staff Communication: plan discussed with patient and nurse  Labs/tests ordered:  none

## 2021-10-07 ENCOUNTER — Other Ambulatory Visit: Payer: Self-pay | Admitting: Orthopedic Surgery

## 2021-10-07 DIAGNOSIS — N492 Inflammatory disorders of scrotum: Secondary | ICD-10-CM

## 2021-10-07 MED ORDER — DOXYCYCLINE HYCLATE 100 MG PO TABS: 100.0000 mg | ORAL_TABLET | Freq: Two times a day (BID) | ORAL | 0 refills | Status: AC

## 2021-10-07 MED ORDER — DOXYCYCLINE HYCLATE 100 MG PO TABS: 100.0000 mg | ORAL_TABLET | Freq: Two times a day (BID) | ORAL | 0 refills | Status: DC

## 2021-10-07 NOTE — Progress Notes (Signed)
Scrotal abscess improved with antibiotics. Treatment options discussed with wife. Will continue doxycycline x 7 days.

## 2021-10-24 ENCOUNTER — Ambulatory Visit (INDEPENDENT_AMBULATORY_CARE_PROVIDER_SITE_OTHER): Payer: Medicare Other | Admitting: Orthopedic Surgery

## 2021-10-24 ENCOUNTER — Encounter: Payer: Self-pay | Admitting: Internal Medicine

## 2021-10-24 ENCOUNTER — Non-Acute Institutional Stay (SKILLED_NURSING_FACILITY): Payer: Medicare Other | Admitting: Internal Medicine

## 2021-10-24 DIAGNOSIS — I82411 Acute embolism and thrombosis of right femoral vein: Secondary | ICD-10-CM | POA: Diagnosis not present

## 2021-10-24 DIAGNOSIS — R600 Localized edema: Secondary | ICD-10-CM | POA: Diagnosis not present

## 2021-10-24 DIAGNOSIS — G20 Parkinson's disease: Secondary | ICD-10-CM

## 2021-10-24 DIAGNOSIS — N492 Inflammatory disorders of scrotum: Secondary | ICD-10-CM

## 2021-10-24 DIAGNOSIS — N1832 Chronic kidney disease, stage 3b: Secondary | ICD-10-CM

## 2021-10-24 DIAGNOSIS — Z89512 Acquired absence of left leg below knee: Secondary | ICD-10-CM

## 2021-10-24 DIAGNOSIS — S88112A Complete traumatic amputation at level between knee and ankle, left lower leg, initial encounter: Secondary | ICD-10-CM

## 2021-10-24 DIAGNOSIS — I10 Essential (primary) hypertension: Secondary | ICD-10-CM

## 2021-10-24 NOTE — Progress Notes (Signed)
Location:  [facility] Room Number: 27 Place of Service:  SNF (31)  Provider:   Code Status: full code Goals of Care:     10/24/2021    4:00 PM  Advanced Directives  Does Patient Have a Medical Advance Directive? Yes  Type of Advance Directive Living will  Does patient want to make changes to medical advance directive? No - Patient declined     Chief Complaint  Patient presents with   Medical Management of Chronic Issues   Quality Metric Gaps    Patient due for shingrix.    HPI: Patient is a 88 y.o. male seen today for medical management of chronic diseases.    Patient is s/p Left BKA for Left Osteomyelitis  Also has h/o  Patient has h/o Parkinson disease Follows with Dr [name] Diagnosed in 2019 in [place]   Also has h/o Osteoporosis , Vit D def Has been on Fosamax before, BPH, Arthritis,    History of right femur fracture followed by acute DVT of right leg.  Patient is doing well No pain or discomfort Eating well Working with therapy Not able to still use his Prosthesis due to mis fitting He did have scrotal abscess and was treated with Doxycyline It is now mostly resolved No other acute issues  Wt Readings from Last 3 Encounters:  10/24/21 186 lb 9.6 oz (84.6 kg)  09/23/21 175 lb 14.4 oz (79.8 kg)  09/18/21 175 lb 14.4 oz (79.8 kg)      Past Medical History:  Diagnosis Date   Acute bronchiolitis    Acute osteomyelitis of calcaneum, left (HCC) 01/11/2021   Anemia    Bursitis of both hips    Complication of anesthesia    wife states pt was a little "loopy" for several days   Dementia (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   Hearing reduced    hearing aid   History of basal cell carcinoma    Hyperlipidemia    Malaise and fatigue    MRSA infection 01/11/2021   Osteoarthritis    hand   Osteoporosis    Palpitation    Parkinson's disease (HCC)    Peripheral vascular disease (HCC)    blood clots   Pre-diabetes    Vitamin D deficiency      Past Surgical History:  Procedure Laterality Date   ABDOMINAL AORTOGRAM W/LOWER EXTREMITY N/A 06/12/2021   Procedure: ABDOMINAL AORTOGRAM W/LOWER EXTREMITY;  Surgeon: Iran Ouch, MD;  Location: [facility];  Service: Cardiovascular;  Laterality: N/A;   AMPUTATION Left 07/03/2021   Procedure: LEFT BELOW KNEE AMPUTATION;  Surgeon: Nadara Mustard, MD;  Location: [facility] OR;  Service: Orthopedics;  Laterality: Left;   CATARACT EXTRACTION  05/31/2015   Eye/right   COLONOSCOPY  02/05/2005, 03/06/2005   normal, 10 years ago   MOHS micrographic surgery     Face surgery   ORIF ACETABULAR FRACTURE Right 10/09/2020   Procedure: OPEN REDUCTION INTERNAL FIXATION (ORIF) ACETABULAR FRACTURE;  Surgeon: [name], MD;  Location: [facility] OR;  Service: Orthopedics;  Laterality: Right;    No Known Allergies  Outpatient Encounter Medications as of 10/24/2021  Medication Sig   acetaminophen (TYLENOL) 325 MG tablet Take 650 mg by mouth every 6 (six) hours as needed (Pain score 1-3 or temp>100.5.).   albuterol (VENTOLIN HFA) 108 (90 Base) MCG/ACT inhaler Inhale 2 puffs into the lungs every 4 (four) hours as needed for wheezing or shortness of breath.   Amino  Acids-Protein Hydrolys (FEEDING SUPPLEMENT, PRO-STAT SUGAR FREE 64,) LIQD Take 30 mLs by mouth in the morning and at bedtime.   apixaban (ELIQUIS) 5 MG TABS tablet Take 5 mg by mouth 2 (two) times daily.   calcium carbonate (OSCAL) 1500 (600 Ca) MG TABS tablet Take 600 mg of elemental calcium by mouth daily with breakfast.   carbidopa-levodopa (SINEMET CR) 50-200 MG tablet Take 1 tablet by mouth at bedtime.   carbidopa-levodopa (SINEMET IR) 25-100 MG tablet Take 1 tablet by mouth in the morning, at noon, in the evening, and at bedtime. 06:00 AM, 12:00 PM, 06:00 PM, 12:00 AM   ferrous sulfate 325 (65 FE) MG tablet Take 325 mg by mouth every Monday, Wednesday, and Friday.   Multiple Vitamins-Minerals (PRESERVISION AREDS 2+MULTI VIT) CAPS Take 1 capsule  by mouth in the morning and at bedtime.   oxyCODONE-acetaminophen (PERCOCET/ROXICET) 5-325 MG tablet Take 1 tablet by mouth every 4 (four) hours as needed.   polyethylene glycol (MIRALAX / GLYCOLAX) 17 g packet Take 17 g by mouth every other day.   senna (SENOKOT) 8.6 MG tablet Take 1 tablet by mouth daily.   torsemide (DEMADEX) 20 MG tablet Take 20 mg by mouth daily.   vitamin B-12 (CYANOCOBALAMIN) 1000 MCG tablet Take 1,000 mcg by mouth daily.   Vitamin D, Cholecalciferol, 25 MCG (1000 UT) TABS Take 1,000 Units by mouth daily.   zinc oxide 20 % ointment Apply 1 application. topically See admin instructions. To buttocks after every incontinent episode and as needed for redness.   [DISCONTINUED] apixaban (ELIQUIS) 5 MG TABS tablet Take 5 mg by mouth 2 (two) times daily.   [DISCONTINUED] torsemide (DEMADEX) 10 MG tablet Take 1 tablet (10 mg total) by mouth daily for 2 days.   No facility-administered encounter medications on file as of 10/24/2021.    Review of Systems:  Review of Systems  Constitutional:  Negative for activity change, appetite change and unexpected weight change.  HENT: Negative.    Respiratory:  Negative for cough and shortness of breath.   Cardiovascular:  Negative for leg swelling.  Gastrointestinal:  Negative for constipation.  Genitourinary:  Negative for frequency.  Musculoskeletal:  Positive for gait problem. Negative for arthralgias and myalgias.  Skin: Negative.  Negative for rash.  Neurological:  Negative for dizziness and weakness.  Psychiatric/Behavioral:  Negative for confusion and sleep disturbance.   All other systems reviewed and are negative.   Health Maintenance  Topic Date Due   Zoster Vaccines- Shingrix (1 of 2) Never done   TETANUS/TDAP  01/31/2022 (Originally 04/23/1950)   INFLUENZA VACCINE  11/05/2021   COVID-19 Vaccine (4 - Pfizer series) 01/18/2022   Pneumonia Vaccine 34+ Years old  Completed   HPV VACCINES  Aged Out    Physical  Exam: Vitals:   10/24/21 1436  BP: 107/66  Pulse: 73  Resp: 20  Temp: 97.7 F (36.5 C)  SpO2: 95%  Weight: 186 lb 9.6 oz (84.6 kg)  Height: 5\' 9"  (1.753 m)   Body mass index is 27.56 kg/m. Physical Exam Vitals reviewed.  Constitutional:      Appearance: Normal appearance.  HENT:     Head: Normocephalic.     Nose: Nose normal.     Mouth/Throat:     Mouth: Mucous membranes are moist.     Pharynx: Oropharynx is clear.  Eyes:     Pupils: Pupils are equal, round, and reactive to light.  Cardiovascular:     Rate and Rhythm: Normal rate and  regular rhythm.     Pulses: Normal pulses.     Heart sounds: No murmur heard. Pulmonary:     Effort: Pulmonary effort is normal. No respiratory distress.     Breath sounds: Normal breath sounds. No rales.  Abdominal:     General: Abdomen is flat. Bowel sounds are normal.     Palpations: Abdomen is soft.  Genitourinary:    Comments: I did not feel any nodule or Abscess anymore Musculoskeletal:        General: Swelling present.     Cervical back: Neck supple.     Comments: S/p Left BKA  Skin:    General: Skin is warm.     Comments: Has small Pressure ulcer on scalp  Neurological:     General: No focal deficit present.     Mental Status: He is alert and oriented to person, place, and time.  Psychiatric:        Mood and Affect: Mood normal.        Thought Content: Thought content normal.     Labs reviewed: Basic Metabolic Panel: Recent Labs    06/04/21 0957 06/20/21 1120 07/03/21 0913 07/08/21 0000 07/18/21 0000  NA 138 136 136 139 137  K 4.4 4.6 4.4 4.2 4.5  CL 97 99 102 102 100  CO2 29 27 26  29* 32*  GLUCOSE 83 121* 96  --   --   BUN 35* 32* 34* 35* 34*  CREATININE 1.49* 1.51* 1.49* 1.1 1.1  CALCIUM 8.9 9.1 9.0 8.5* 8.9  MG  --   --  2.2  --   --    Liver Function Tests: Recent Labs    12/27/20 0000 06/20/21 1120 07/03/21 0913 07/08/21 0000  AST 18 12 14* 12*  ALT 6* 6* 6 3*  ALKPHOS 46  --  48 45  BILITOT   --  0.6 0.5  --   PROT  --  6.9 6.9  --   ALBUMIN 2.9*  --  3.4* 3.2*   No results for input(s): "LIPASE", "AMYLASE" in the last 8760 hours. No results for input(s): "AMMONIA" in the last 8760 hours. CBC: Recent Labs    06/20/21 1120 07/03/21 1618 07/04/21 0128 07/05/21 0302 07/18/21 0000 07/25/21 0000  WBC 7.1 7.4 8.8 6.9 5.5 6.0  NEUTROABS 5,375 6.6  --   --  3,471.00  --   HGB 10.9* 9.5* 7.6* 8.3* 9.8* 10.3*  HCT 33.9* 29.6* 24.0* 25.6* 30* 32*  MCV 91.1 91.9 92.0 89.8  --   --   PLT 234 207 179 198 276 242   Lipid Panel: No results for input(s): "CHOL", "HDL", "LDLCALC", "TRIG", "CHOLHDL", "LDLDIRECT" in the last 8760 hours. Lab Results  Component Value Date   HGBA1C 5.8 (H) 07/03/2021    Procedures since last visit: No results found.  Assessment/Plan 1. S/P below knee amputation, left (HCC) Working with therapy Also needs Prothesis adjustment No Pain  Some phantom feeeling  2. DVT of deep femoral vein, right (HCC) Discontinue Eliquis as he is now more mobile Was on Eliquis for almost 1 year  3. Bilateral leg edema On low dose of Torsemide  4. Parkinson's disease (HCC) On sinemet  5. Anemia On Iron Repeat CBC  6. Stage 3b chronic kidney disease (HCC) Creat stable  7. Scrotal nodule Seems mostly resolved Will continue monitor 07/05/2021 in the past has been negative 8 Pressure wound in skull as he sleeps on it Foam dressing Dermatology to evaluate  CBC in  1 week Next appt:  Visit date not found

## 2021-10-24 NOTE — Progress Notes (Deleted)
Location:   [facility] Room Number: 27 Place of Service:  SNF [phone]) Provider:  Veleta Miners MD  Virgie Dad, MD  Patient Care Team: Virgie Dad, MD as PCP - General (Internal Medicine) [name], Eustace Quail, DO as Consulting Physician (Neurology)  Extended Emergency Contact Information Primary Emergency Contact: Gelber,jane Address: [place].          apt [phone] Johnnette Litter of [place] Phone: [phone] Mobile Phone: [phone] Relation: Spouse Secondary Emergency Contact: Adriance,kenneth Home Phone: [phone] Mobile Phone: [phone] Relation: Son  Code Status:  Full Code Goals of care: Advanced Directive information    10/24/2021    4:00 PM  Advanced Directives  Does Patient Have a Medical Advance Directive? Yes  Type of Advance Directive Living will  Does patient want to make changes to medical advance directive? No - Patient declined     Chief Complaint  Patient presents with   Medical Management of Chronic Issues   Quality Metric Gaps    Patient due for shingrix.    HPI:  Pt is a 88 y.o. male seen today for medical management of chronic diseases.     Past Medical History:  Diagnosis Date   Acute bronchiolitis    Acute osteomyelitis of calcaneum, left ([place]) 01/11/2021   Anemia    Bursitis of both hips    Complication of anesthesia    wife states pt was a little "loopy" for several days   Dementia ([place])    due to Parkinson's   Hearing reduced    hearing aid   History of basal cell carcinoma    Hyperlipidemia    Malaise and fatigue    MRSA infection 01/11/2021   Osteoarthritis    hand   Osteoporosis    Palpitation    Parkinson's disease ([place])    Peripheral vascular disease ([place])    blood clots   Pre-diabetes    Vitamin D deficiency    Past Surgical History:  Procedure Laterality Date   ABDOMINAL AORTOGRAM W/LOWER EXTREMITY N/A 06/12/2021   Procedure: ABDOMINAL AORTOGRAM W/LOWER  EXTREMITY;  Surgeon: Wellington Hampshire, MD;  Location: [facility];  Service: Cardiovascular;  Laterality: N/A;   AMPUTATION Left 07/03/2021   Procedure: LEFT BELOW KNEE AMPUTATION;  Surgeon: Newt Minion, MD;  Location: [facility];  Service: Orthopedics;  Laterality: Left;   CATARACT EXTRACTION  05/31/2015   Eye/right   COLONOSCOPY  02/05/2005, 03/06/2005   normal, 10 years ago   MOHS micrographic surgery     Face surgery   ORIF ACETABULAR FRACTURE Right 10/09/2020   Procedure: OPEN REDUCTION INTERNAL FIXATION (ORIF) ACETABULAR FRACTURE;  Surgeon: Altamese Orwell, MD;  Location: [facility];  Service: Orthopedics;  Laterality: Right;    No Known Allergies  Allergies as of 10/24/2021   No Known Allergies      Medication List        Accurate as of October 24, 2021  4:00 PM. If you have any questions, ask your nurse or doctor.          acetaminophen 325 MG tablet Commonly known as: TYLENOL Take 650 mg by mouth every 6 (six) hours as needed (Pain score 1-3 or temp>100.5.).   albuterol 108 (90 Base) MCG/ACT inhaler Commonly known as: VENTOLIN HFA Inhale 2 puffs into the lungs every 4 (four) hours as needed for wheezing or shortness of breath.   calcium carbonate 1500 (600 Ca) MG Tabs tablet Commonly known  as: OSCAL Take 600 mg of elemental calcium by mouth daily with breakfast.   carbidopa-levodopa 25-100 MG tablet Commonly known as: SINEMET IR Take 1 tablet by mouth in the morning, at noon, in the evening, and at bedtime. 06:00 AM, 12:00 PM, 06:00 PM, 12:00 AM   carbidopa-levodopa 50-200 MG tablet Commonly known as: SINEMET CR Take 1 tablet by mouth at bedtime.   Eliquis 5 MG Tabs tablet Generic drug: apixaban Take 5 mg by mouth 2 (two) times daily. What changed: Another medication with the same name was removed. Continue taking this medication, and follow the directions you see here. Changed by: [name], MD   feeding supplement (PRO-STAT SUGAR FREE 64) Liqd Take 30  mLs by mouth in the morning and at bedtime.   ferrous sulfate 325 (65 FE) MG tablet Take 325 mg by mouth every Monday, Wednesday, and Friday.   oxyCODONE-acetaminophen 5-325 MG tablet Commonly known as: PERCOCET/ROXICET Take 1 tablet by mouth every 4 (four) hours as needed.   polyethylene glycol 17 g packet Commonly known as: MIRALAX / GLYCOLAX Take 17 g by mouth every other day.   PreserVision AREDS 2+Multi Vit Caps Take 1 capsule by mouth in the morning and at bedtime.   senna 8.6 MG tablet Commonly known as: SENOKOT Take 1 tablet by mouth daily.   torsemide 20 MG tablet Commonly known as: DEMADEX Take 20 mg by mouth daily. What changed: Another medication with the same name was removed. Continue taking this medication, and follow the directions you see here. Changed by: [name], MD   vitamin B-12 1000 MCG tablet Commonly known as: CYANOCOBALAMIN Take 1,000 mcg by mouth daily.   Vitamin D (Cholecalciferol) 25 MCG (1000 UT) Tabs Take 1,000 Units by mouth daily.   zinc oxide 20 % ointment Apply 1 application. topically See admin instructions. To buttocks after every incontinent episode and as needed for redness.        Review of Systems  Immunization History  Administered Date(s) Administered   H1N1 06/13/2008   Influenza Split 03/05/2006, 02/26/2007, 01/26/2008, 01/18/2009, 12/12/2009, 01/16/2011, 01/29/2012, 02/01/2013, 02/08/2014, 02/13/2015, 01/10/2016, 02/23/2017, 02/24/2018, 01/24/2019, 12/22/2019   Influenza, High Dose Seasonal PF 01/30/2021   Moderna SARS-COV2 Booster Vaccination 09/18/2021   PFIZER(Purple Top)SARS-COV-2 Vaccination 05/04/2019, 05/25/2019, 09/01/2020   Pfizer Covid-19 Vaccine Bivalent Booster 74yrs & up 12/27/2019, 12/26/2020   Pneumococcal Conjugate-13 02/10/2014   Pneumococcal Polysaccharide-23 06/10/2011   Zoster, Live 07/04/2009   Pertinent  Health Maintenance Due  Topic Date Due   INFLUENZA VACCINE  11/05/2021       07/03/2021    5:00 PM 07/03/2021    7:16 PM 07/04/2021    9:30 AM 07/04/2021    7:18 PM 07/05/2021    7:15 AM  Fall Risk  Patient Fall Risk Level Moderate fall risk Moderate fall risk Moderate fall risk Moderate fall risk High fall risk   Functional Status Survey:    Vitals:   10/24/21 1436  BP: 107/66  Pulse: 73  Resp: 20  Temp: 97.7 F (36.5 C)  SpO2: 95%  Weight: 186 lb 9.6 oz (84.6 kg)  Height: 5\' 9"  (1.753 m)   Body mass index is 27.56 kg/m. Physical Exam  Labs reviewed: Recent Labs    06/04/21 0957 06/20/21 1120 07/03/21 0913 07/08/21 0000 07/18/21 0000  NA 138 136 136 139 137  K 4.4 4.6 4.4 4.2 4.5  CL 97 99 102 102 100  CO2 29 27 26  29* 32*  GLUCOSE 83 121*  96  --   --   BUN 35* 32* 34* 35* 34*  CREATININE 1.49* 1.51* 1.49* 1.1 1.1  CALCIUM 8.9 9.1 9.0 8.5* 8.9  MG  --   --  2.2  --   --    Recent Labs    12/27/20 0000 06/20/21 1120 07/03/21 0913 07/08/21 0000  AST 18 12 14* 12*  ALT 6* 6* 6 3*  ALKPHOS 46  --  48 45  BILITOT  --  0.6 0.5  --   PROT  --  6.9 6.9  --   ALBUMIN 2.9*  --  3.4* 3.2*   Recent Labs    06/20/21 1120 07/03/21 1618 07/04/21 0128 07/05/21 0302 07/18/21 0000 07/25/21 0000  WBC 7.1 7.4 8.8 6.9 5.5 6.0  NEUTROABS 5,375 6.6  --   --  3,471.00  --   HGB 10.9* 9.5* 7.6* 8.3* 9.8* 10.3*  HCT 33.9* 29.6* 24.0* 25.6* 30* 32*  MCV 91.1 91.9 92.0 89.8  --   --   PLT 234 207 179 198 276 242   No results found for: "TSH" Lab Results  Component Value Date   HGBA1C 5.8 (H) 07/03/2021   No results found for: "CHOL", "HDL", "LDLCALC", "LDLDIRECT", "TRIG", "CHOLHDL"  Significant Diagnostic Results in last 30 days:  No results found.  Assessment/Plan There are no diagnoses linked to this encounter.   Family/ staff Communication:   Labs/tests ordered:

## 2021-10-31 LAB — CBC AND DIFFERENTIAL
HCT: 41 (ref 41–53)
Hemoglobin: 13.2 — AB (ref 13.5–17.5)
Platelets: 217 10*3/uL (ref 150–400)
WBC: 6.3

## 2021-10-31 LAB — CBC: RBC: 4.59 (ref 3.87–5.11)

## 2021-11-05 ENCOUNTER — Encounter: Payer: Self-pay | Admitting: Orthopedic Surgery

## 2021-11-05 NOTE — Progress Notes (Signed)
Office Visit Note   Patient: Greg [name]           Date of Birth: October 01, 1931           MRN: [id number] Visit Date: 10/24/2021              Requested by: [name], MD [address] [place],  [place] [address] PCP: [name], MD  Chief Complaint  Patient presents with   Left Leg - Routine Post Op    07/03/21 left BKA      HPI: Patient is 4 months status post left transtibial amputation he is currently undergoing therapy at friend's home.  Patient had casting for prosthesis 6 weeks ago.  Assessment & Plan: Visit Diagnoses:  1. Below-knee amputation of left lower extremity (HCC)     Plan: Start gait training with the prosthesis follow-up as needed.  Follow-Up Instructions: Return if symptoms worsen or fail to improve.   Ortho Exam  Patient is alert, oriented, no adenopathy, well-dressed, normal affect, normal respiratory effort. Examination of the left leg there is a well-healed and consolidated residual limb.  Patient lacks about 5 degrees to full extension.  Imaging: No results found. No images are attached to the encounter.  Labs: Lab Results  Component Value Date   HGBA1C 5.8 (H) 07/03/2021   HGBA1C 5.8 05/23/2021   HGBA1C 5.9 (H) 10/08/2020   ESRSEDRATE 55 (H) 06/20/2021   ESRSEDRATE 75 (H) 03/22/2021   ESRSEDRATE 48 02/14/2021   CRP 31.9 (H) 06/20/2021   CRP 151.2 (H) 03/22/2021   CRP 16.8 01/15/2021     Lab Results  Component Value Date   ALBUMIN 3.2 (A) 07/08/2021   ALBUMIN 3.4 (L) 07/03/2021   ALBUMIN 2.9 (A) 12/27/2020   PREALBUMIN 18.5 07/03/2021    Lab Results  Component Value Date   MG 2.2 07/03/2021   MG 2.1 10/10/2020   Lab Results  Component Value Date   VD25OH 28.44 (L) 07/03/2021   VD25OH 30.55 10/09/2020    Lab Results  Component Value Date   PREALBUMIN 18.5 07/03/2021      Latest Ref Rng & Units 07/25/2021   12:00 AM 07/18/2021   12:00 AM 07/05/2021    3:02 AM  CBC EXTENDED  WBC  6.0     5.5     6.9    RBC 3.87 - 5.11 3.49     3.33     2.85   Hemoglobin 13.5 - 17.5 10.3     9.8     8.3   HCT 41 - 53 32     30     25.6   Platelets 150 - 400 K/uL 242     276     198   NEUT#   3,471.00          This result is from an external source.     There is no height or weight on file to calculate BMI.  Orders:  No orders of the defined types were placed in this encounter.  No orders of the defined types were placed in this encounter.    Procedures: No procedures performed  Clinical Data: No additional findings.  ROS:  All other systems negative, except as noted in the HPI. Review of Systems  Objective: Vital Signs: There were no vitals taken for this visit.  Specialty Comments:  No specialty comments available.  PMFS History: Patient Active Problem List   Diagnosis Date Noted   Below-knee amputation  of left lower extremity (HCC) 07/03/2021   Vitamin B12 deficiency 03/05/2021   [place] rash of groin 03/05/2021   (HFpEF) heart failure with preserved ejection fraction (HCC) 02/04/2021   Acute osteomyelitis of left calcaneus (HCC) 01/11/2021   MRSA infection 01/11/2021   CKD (chronic kidney disease) stage 3, GFR 30-59 ml/min (HCC) 01/08/2021   DVT of deep femoral vein, right (HCC) 12/22/2020   Pressure ulcer, unstageable (HCC) 11/12/2020   Hyponatremia 10/15/2020   Prediabetes 10/15/2020   HTN (hypertension) 10/15/2020   Slow transit constipation 10/15/2020   Blood loss anemia 10/15/2020   Closed displaced fracture of right acetabulum [facility])    Closed right hip fracture (HCC) 10/08/2020   Parkinson's disease (HCC) 09/25/2020   BPH (benign prostatic hyperplasia) 09/25/2020   Edema 09/25/2020   Osteoarthritis, multiple sites 09/25/2020   Osteoporosis 09/25/2020   Hyperlipidemia 09/25/2020   Past Medical History:  Diagnosis Date   Acute bronchiolitis    Acute osteomyelitis of calcaneum, left (HCC) 01/11/2021   Anemia    Bursitis of both hips    Complication of  anesthesia    wife states pt was a little "loopy" for several days   Dementia (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   Hearing reduced    hearing aid   History of basal cell carcinoma    Hyperlipidemia    Malaise and fatigue    MRSA infection 01/11/2021   Osteoarthritis    hand   Osteoporosis    Palpitation    Parkinson's disease (HCC)    Peripheral vascular disease (HCC)    blood clots   Pre-diabetes    Vitamin D deficiency     Family History  Problem Relation Age of Onset   Prostate cancer Father    Skin cancer Brother     Past Surgical History:  Procedure Laterality Date   ABDOMINAL AORTOGRAM W/LOWER EXTREMITY N/A 06/12/2021   Procedure: ABDOMINAL AORTOGRAM W/LOWER EXTREMITY;  Surgeon: Iran Ouch, MD;  Location: [facility];  Service: Cardiovascular;  Laterality: N/A;   AMPUTATION Left 07/03/2021   Procedure: LEFT BELOW KNEE AMPUTATION;  Surgeon: Nadara Mustard, MD;  Location: [facility];  Service: Orthopedics;  Laterality: Left;   CATARACT EXTRACTION  05/31/2015   Eye/right   COLONOSCOPY  02/05/2005, 03/06/2005   normal, 10 years ago   MOHS micrographic surgery     Face surgery   ORIF ACETABULAR FRACTURE Right 10/09/2020   Procedure: OPEN REDUCTION INTERNAL FIXATION (ORIF) ACETABULAR FRACTURE;  Surgeon: [name], MD;  Location: [facility];  Service: Orthopedics;  Laterality: Right;   Social History   Occupational History   Occupation: retired    Comment: Psychologist, educational professor  Tobacco Use   Smoking status: Never   Smokeless tobacco: Never  Vaping Use   Vaping Use: Never used  Substance and Sexual Activity   Alcohol use: Never   Drug use: Never   Sexual activity: Not on file

## 2021-11-18 ENCOUNTER — Encounter: Payer: Self-pay | Admitting: Orthopedic Surgery

## 2021-11-18 ENCOUNTER — Non-Acute Institutional Stay (SKILLED_NURSING_FACILITY): Payer: Medicare Other | Admitting: Orthopedic Surgery

## 2021-11-18 DIAGNOSIS — I10 Essential (primary) hypertension: Secondary | ICD-10-CM

## 2021-11-18 DIAGNOSIS — I82411 Acute embolism and thrombosis of right femoral vein: Secondary | ICD-10-CM

## 2021-11-18 DIAGNOSIS — L989 Disorder of the skin and subcutaneous tissue, unspecified: Secondary | ICD-10-CM

## 2021-11-18 DIAGNOSIS — N1832 Chronic kidney disease, stage 3b: Secondary | ICD-10-CM

## 2021-11-18 DIAGNOSIS — G20A1 Parkinson's disease without dyskinesia, without mention of fluctuations: Secondary | ICD-10-CM

## 2021-11-18 DIAGNOSIS — R7303 Prediabetes: Secondary | ICD-10-CM

## 2021-11-18 DIAGNOSIS — G20 Parkinson's disease: Secondary | ICD-10-CM

## 2021-11-18 DIAGNOSIS — R600 Localized edema: Secondary | ICD-10-CM

## 2021-11-18 DIAGNOSIS — S40212A Abrasion of left shoulder, initial encounter: Secondary | ICD-10-CM

## 2021-11-18 DIAGNOSIS — D500 Iron deficiency anemia secondary to blood loss (chronic): Secondary | ICD-10-CM

## 2021-11-18 DIAGNOSIS — N492 Inflammatory disorders of scrotum: Secondary | ICD-10-CM

## 2021-11-18 DIAGNOSIS — Z89512 Acquired absence of left leg below knee: Secondary | ICD-10-CM

## 2021-11-18 NOTE — Progress Notes (Signed)
Location:   [place] Room Number: 27-A Place of Service:  SNF [phone]) Provider:  Windell Moulding, NP  PCP: Virgie Dad, MD  Patient Care Team: Virgie Dad, MD as PCP - General (Internal Medicine) [name], Eustace Quail, DO as Consulting Physician (Neurology)  Extended Emergency Contact Information Primary Emergency Contact: Cannella,jane Address: [place].          apt [phone] Johnnette Litter of [place] Phone: [phone] Mobile Phone: [phone] Relation: Spouse Secondary Emergency Contact: Sadowski,kenneth Home Phone: [phone] Mobile Phone: [phone] Relation: Son  Code Status:  FULL CODE Goals of care: Advanced Directive information    11/18/2021   11:00 AM  Advanced Directives  Does Patient Have a Medical Advance Directive? Yes  Type of Advance Directive Living will  Does patient want to make changes to medical advance directive? No - Patient declined     Chief Complaint  Patient presents with   Medical Management of Chronic Issues    Routine Visit.   Immunizations     Discuss the need for Shingrix vaccine, and Influenza vaccine.    HPI:  Pt is a 88 y.o. male seen today for medical management of chronic diseases.    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 Sharol Given due to ongoing osteomyelitis (+MRSA) of left heel, doing well with PT/OT, denies phantom pain, working with [facility] for prosthesis- ambulating 200 ft with prosthesis, remains on Eliquis for DVT prophylaxis BLE- LV EF 60-65% 02/20/2021, improved since amputation, unable to tolerate aggressive diuresis in past, pitting edema BLE L>R, remains on torsemide  DVT of deep femoral vein- diagnosed 12/2020, remains on Eliquis Parkinson's-  diagnosed 2019, followed by neurology, remains on sinemet Left shoulder skin abrasion- first noticed 08/06, scratched open by patient, nursing staff covering with polymem dressing Skin lesion- requesting dermatology referral due to  raised mole to neck/left ear HTN- BUN/creat 34/1.1 07/18/2021, coreg discontinued due to hypotension CKD 3b- see above Prediabetes- - A1c 5.8 07/03/2021 Anemia- hgb 13.2 10/31/2021, remains on ferrous sulfate M/W/F Scrotal abscess- noted 06/14, scrotal U/S unremarkable, given doxycycline and resolved  No recent falls or injuries.   Wife reports some changes to hearing, R>L. He has completed 2 rounds of debrox.   Recent blood pressures:  08/08- 130/82  08/01- 140/87  07/25- 111/59  Recent weights:  08/02- 181.9 lbs  07/03- 186.8 lbs  06/01- 175.9 lbs    Past Medical History:  Diagnosis Date   Acute bronchiolitis    Acute osteomyelitis of calcaneum, left ([place]) 01/11/2021   Anemia    Bursitis of both hips    Complication of anesthesia    wife states pt was a little "loopy" for several days   Dementia ([place])    due to Parkinson's   Hearing reduced    hearing aid   History of basal cell carcinoma    Hyperlipidemia    Malaise and fatigue    MRSA infection 01/11/2021   Osteoarthritis    hand   Osteoporosis    Palpitation    Parkinson's disease ([place])    Peripheral vascular disease ([place])    blood clots   Pre-diabetes    Vitamin D deficiency    Past Surgical History:  Procedure Laterality Date   ABDOMINAL AORTOGRAM W/LOWER EXTREMITY N/A 06/12/2021   Procedure: ABDOMINAL AORTOGRAM W/LOWER EXTREMITY;  Surgeon: Wellington Hampshire, MD;  Location: [place] CV LAB;  Service: Cardiovascular;  Laterality: N/A;   AMPUTATION Left 07/03/2021  Procedure: LEFT BELOW KNEE AMPUTATION;  Surgeon: Nadara Mustard, MD;  Location: [facility] OR;  Service: Orthopedics;  Laterality: Left;   CATARACT EXTRACTION  05/31/2015   Eye/right   COLONOSCOPY  02/05/2005, 03/06/2005   normal, 10 years ago   MOHS micrographic surgery     Face surgery   ORIF ACETABULAR FRACTURE Right 10/09/2020   Procedure: OPEN REDUCTION INTERNAL FIXATION (ORIF) ACETABULAR FRACTURE;  Surgeon: [name], MD;  Location: [facility]  OR;  Service: Orthopedics;  Laterality: Right;    No Known Allergies  Allergies as of 11/18/2021   No Known Allergies      Medication List        Accurate as of November 18, 2021 11:00 AM. If you have any questions, ask your nurse or doctor.          acetaminophen 500 MG tablet Commonly known as: TYLENOL Take 500 mg by mouth 3 (three) times daily as needed for mild pain or moderate pain. What changed: Another medication with the same name was removed. Continue taking this medication, and follow the directions you see here. Changed by: Octavia Heir, NP   albuterol 108 (90 Base) MCG/ACT inhaler Commonly known as: VENTOLIN HFA Inhale 2 puffs into the lungs every 4 (four) hours as needed for wheezing or shortness of breath.   calcium carbonate 1500 (600 Ca) MG Tabs tablet Commonly known as: OSCAL Take 600 mg of elemental calcium by mouth daily with breakfast.   carbidopa-levodopa 25-100 MG tablet Commonly known as: SINEMET IR Take 1 tablet by mouth in the morning, at noon, in the evening, and at bedtime. 06:00 AM, 12:00 PM, 06:00 PM, 12:00 AM   carbidopa-levodopa 50-200 MG tablet Commonly known as: SINEMET CR Take 1 tablet by mouth at bedtime.   cyanocobalamin 1000 MCG tablet Commonly known as: VITAMIN B12 Take 1,000 mcg by mouth daily.   feeding supplement (PRO-STAT SUGAR FREE 64) Liqd Take 30 mLs by mouth in the morning and at bedtime.   ferrous sulfate 325 (65 FE) MG tablet Take 325 mg by mouth every Monday, Wednesday, and Friday.   polyethylene glycol 17 g packet Commonly known as: MIRALAX / GLYCOLAX Take 17 g by mouth every other day.   PreserVision AREDS 2+Multi Vit Caps Take 1 capsule by mouth in the morning and at bedtime.   senna 8.6 MG tablet Commonly known as: SENOKOT Take 1 tablet by mouth daily.   torsemide 20 MG tablet Commonly known as: DEMADEX Take 20 mg by mouth in the morning.   Vitamin D (Cholecalciferol) 25 MCG (1000 UT) Tabs Take 1,000  Units by mouth daily.   zinc oxide 20 % ointment Apply 1 application. topically See admin instructions. To buttocks after every incontinent episode and as needed for redness.        Review of Systems  Constitutional:  Negative for activity change, appetite change, chills, fatigue and fever.  HENT:  Positive for hearing loss. Negative for congestion, tinnitus and trouble swallowing.   Eyes:  Negative for visual disturbance.  Respiratory:  Negative for cough, shortness of breath and wheezing.   Cardiovascular:  Negative for chest pain and leg swelling.  Gastrointestinal:  Positive for constipation. Negative for abdominal distention, abdominal pain, diarrhea, nausea and vomiting.  Genitourinary:  Negative for dysuria, hematuria, scrotal swelling and testicular pain.  Musculoskeletal:  Positive for arthralgias and gait problem.  Skin:  Positive for wound.  Neurological:  Positive for tremors and weakness. Negative for dizziness and headaches.  Psychiatric/Behavioral:  Negative for confusion, dysphoric mood and sleep disturbance.     Immunization History  Administered Date(s) Administered   H1N1 06/13/2008   Influenza Split 03/05/2006, 02/26/2007, 01/26/2008, 01/18/2009, 12/12/2009, 01/16/2011, 01/29/2012, 02/01/2013, 02/08/2014, 02/13/2015, 01/10/2016, 02/23/2017, 02/24/2018, 01/24/2019, 12/22/2019   Influenza, High Dose Seasonal PF 01/30/2021   Moderna SARS-COV2 Booster Vaccination 09/18/2021   PFIZER(Purple Top)SARS-COV-2 Vaccination 05/04/2019, 05/25/2019, 09/01/2020   Pfizer Covid-19 Vaccine Bivalent Booster 94yrs & up 12/27/2019, 12/26/2020   Pneumococcal Conjugate-13 02/10/2014   Pneumococcal Polysaccharide-23 06/10/2011   Zoster, Live 07/04/2009   Pertinent  Health Maintenance Due  Topic Date Due   INFLUENZA VACCINE  11/05/2021      07/03/2021    5:00 PM 07/03/2021    7:16 PM 07/04/2021    9:30 AM 07/04/2021    7:18 PM 07/05/2021    7:15 AM  Fall Risk  Patient Fall Risk  Level Moderate fall risk Moderate fall risk Moderate fall risk Moderate fall risk High fall risk   Functional Status Survey:    Vitals:   11/18/21 1056  BP: 130/82  Pulse: 80  Resp: 18  Temp: (!) 97.3 F (36.3 C)  SpO2: 97%  Weight: 181 lb 14.4 oz (82.5 kg)  Height: 5\' 9"  (1.753 m)   Body mass index is 26.86 kg/m. Physical Exam Vitals reviewed.  Constitutional:      General: He is not in acute distress. HENT:     Head: Normocephalic.     Right Ear: Ear canal and external ear normal. There is no impacted cerumen.     Left Ear: Ear canal and external ear normal. There is no impacted cerumen.     Nose: Nose normal.     Mouth/Throat:     Mouth: Mucous membranes are moist.  Eyes:     General:        Right eye: No discharge.        Left eye: No discharge.  Cardiovascular:     Rate and Rhythm: Normal rate and regular rhythm.     Pulses: Normal pulses.     Heart sounds: Murmur heard.  Pulmonary:     Effort: Pulmonary effort is normal. No respiratory distress.     Breath sounds: Normal breath sounds. No wheezing.  Abdominal:     General: Bowel sounds are normal. There is no distension.     Palpations: Abdomen is soft.     Tenderness: There is no abdominal tenderness.  Musculoskeletal:     Cervical back: Neck supple.     Right lower leg: No edema.     Comments: L BKA stump closed, no skin breakdown  Skin:    General: Skin is warm and dry.     Capillary Refill: Capillary refill takes less than 2 seconds.     Comments: Approx 2-3 cm skin abrasion to left scapula, CDI, drainage serous, surrounding skin intact with mild erythema. Several skin lesions located to left neck and left ear approx 1-2 cm, raised, irregular shape and color, no drainage/tenderness, surrounding skin intact  Neurological:     General: No focal deficit present.     Mental Status: He is alert and oriented to person, place, and time.     Motor: Weakness present.     Gait: Gait abnormal.     Comments:  Wheelchair/walker  Psychiatric:        Mood and Affect: Mood normal.        Behavior: Behavior normal.     Labs reviewed: Recent Labs  06/04/21 0957 06/20/21 1120 07/03/21 0913 07/08/21 0000 07/18/21 0000  NA 138 136 136 139 137  K 4.4 4.6 4.4 4.2 4.5  CL 97 99 102 102 100  CO2 29 27 26  29* 32*  GLUCOSE 83 121* 96  --   --   BUN 35* 32* 34* 35* 34*  CREATININE 1.49* 1.51* 1.49* 1.1 1.1  CALCIUM 8.9 9.1 9.0 8.5* 8.9  MG  --   --  2.2  --   --    Recent Labs    12/27/20 0000 06/20/21 1120 07/03/21 0913 07/08/21 0000  AST 18 12 14* 12*  ALT 6* 6* 6 3*  ALKPHOS 46  --  48 45  BILITOT  --  0.6 0.5  --   PROT  --  6.9 6.9  --   ALBUMIN 2.9*  --  3.4* 3.2*   Recent Labs    06/20/21 1120 07/03/21 1618 07/04/21 0128 07/05/21 0302 07/18/21 0000 07/25/21 0000 10/31/21 0000  WBC 7.1 7.4 8.8 6.9 5.5 6.0 6.3  NEUTROABS 5,375 6.6  --   --  3,471.00  --   --   HGB 10.9* 9.5* 7.6* 8.3* 9.8* 10.3* 13.2*  HCT 33.9* 29.6* 24.0* 25.6* 30* 32* 41  MCV 91.1 91.9 92.0 89.8  --   --   --   PLT 234 207 179 198 276 242 217   No results found for: "TSH" Lab Results  Component Value Date   HGBA1C 5.8 (H) 07/03/2021   No results found for: "CHOL", "HDL", "LDLCALC", "LDLDIRECT", "TRIG", "CHOLHDL"  Significant Diagnostic Results in last 30 days:  No results found.  Assessment/Plan 1. S/P below knee amputation, left ([place]) - -  surgery 03/29 by Dr. Sharol Given due to ongoing osteomyelitis (+MRSA) of left heel - no phantom pain - working with [facility] for prothesis - cont PT/OT- ambulating 200 ft with prosthetic  2. Bilateral leg edema - non pitting edema - cont torsemide- cannot tolerate high doses  3. DVT of deep femoral vein, right ([place]) - Eliquis started 12/2020 - plan to d/c when more ambulatory  4. Parkinson's disease ([place]) - followed by neurology - cont sinemet - cont skilled nursing care  5. Abrasion of skin of left shoulder - first noted 08/06 - nursing has  covered with polymem dressing prn - mild erythema noted - start mupirocin 2% ointment- apply to left scapula abrasion BID x 7 days  6. Skin lesion of neck - approx 1-2 cm lesions to left neck/left ear - suspicious for malignancy - in house dermatology consult  7. Primary hypertension - controlled without medication - off coreg due to hypotension  8. Stage 3b chronic kidney disease ([place]) - avoid nephrotoxic drugs like NSAIDS and dose adjust medications to be renally excreted - encourage hydration with water   9. Prediabetes - A1c stable  10. Blood loss anemia - hgb improved, now 13.2  11. Scrotal abscess - noted 09/2021 - scrotal U/S unremarkable - resolved with doxycycline x 2 weeks    Family/ staff Communication: plan discussed with patient and nurse  Labs/tests ordered: in- house dermatology referral

## 2021-12-16 ENCOUNTER — Encounter: Payer: Self-pay | Admitting: Orthopedic Surgery

## 2021-12-16 ENCOUNTER — Non-Acute Institutional Stay (SKILLED_NURSING_FACILITY): Payer: Medicare Other | Admitting: Orthopedic Surgery

## 2021-12-16 DIAGNOSIS — G20 Parkinson's disease: Secondary | ICD-10-CM

## 2021-12-16 DIAGNOSIS — L989 Disorder of the skin and subcutaneous tissue, unspecified: Secondary | ICD-10-CM

## 2021-12-16 DIAGNOSIS — N1832 Chronic kidney disease, stage 3b: Secondary | ICD-10-CM

## 2021-12-16 DIAGNOSIS — D500 Iron deficiency anemia secondary to blood loss (chronic): Secondary | ICD-10-CM

## 2021-12-16 DIAGNOSIS — Z89512 Acquired absence of left leg below knee: Secondary | ICD-10-CM

## 2021-12-16 DIAGNOSIS — I82411 Acute embolism and thrombosis of right femoral vein: Secondary | ICD-10-CM | POA: Diagnosis not present

## 2021-12-16 DIAGNOSIS — I10 Essential (primary) hypertension: Secondary | ICD-10-CM

## 2021-12-16 DIAGNOSIS — G20A1 Parkinson's disease without dyskinesia, without mention of fluctuations: Secondary | ICD-10-CM

## 2021-12-16 DIAGNOSIS — R600 Localized edema: Secondary | ICD-10-CM

## 2021-12-16 DIAGNOSIS — R7303 Prediabetes: Secondary | ICD-10-CM

## 2021-12-16 NOTE — Progress Notes (Signed)
Location:  [facility] Room Number: 27/A Place of Service:    Provider:  Octavia Heir, NP   [name], MD  Patient Care Team: [name], MD as PCP - General (Internal Medicine) [name], Octaviano Batty, DO as Consulting Physician (Neurology)  Extended Emergency Contact Information Primary Emergency Contact: Ku,jane Address: [address] [place].          apt [phone]  [phone] Darden Amber of [place] Home Phone: [phone] Mobile Phone: [phone] Relation: Spouse Secondary Emergency Contact: Denis,kenneth Home Phone: [phone] Mobile Phone: [phone] Relation: Son  Code Status:  Full code Goals of care: Advanced Directive information    11/18/2021   11:00 AM  Advanced Directives  Does Patient Have a Medical Advance Directive? Yes  Type of Advance Directive Living will  Does patient want to make changes to medical advance directive? No - Patient declined     Chief Complaint  Patient presents with   Medical Management of Chronic Issues    HPI:  Pt is a 88 y.o. male seen today for medical management of chronic diseases.    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 Lajoyce Corners due to ongoing osteomyelitis (+MRSA) of left heel, doing well with PT/OT, denies phantom pain, now has prosthesis- continues to work with PT, remains on Eliquis for DVT prophylaxis Parkinson's-  diagnosed 2019, followed by neurology, remains on sinemet BLE- LV EF 60-65% 02/20/2021, improved since amputation, remains on torsemide  DVT of deep femoral vein- diagnosed 12/2020, remains on Eliquis Skin lesion- lesion to back of head, scheduled to see dermatology 10/05 HTN- BUN/creat 34/1.1 07/18/2021, coreg discontinued due to hypotension CKD 3b- see above Prediabetes- - A1c 5.8 07/03/2021 Anemia- hgb 13.2 10/31/2021, remains on ferrous sulfate M/W/F  No recent falls or injuries.   Recent blood pressures:  09/05- 156/75  08/29- 125/70  08/22- 116/60  Recent  weights:  09/05- 181.1 lbs  08/02- 181.9 lbs  07/03- 186.8 lbs       Past Medical History:  Diagnosis Date   Acute bronchiolitis    Acute osteomyelitis of calcaneum, left ([facility]) 01/11/2021   Anemia    Bursitis of both hips    Complication of anesthesia    wife states pt was a little "loopy" for several days   Dementia (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   Hearing reduced    hearing aid   History of basal cell carcinoma    Hyperlipidemia    Malaise and fatigue    MRSA infection 01/11/2021   Osteoarthritis    hand   Osteoporosis    Palpitation    Parkinson's disease ([facility])    Peripheral vascular disease ([facility])    blood clots   Pre-diabetes    Vitamin D deficiency    Past Surgical History:  Procedure Laterality Date   ABDOMINAL AORTOGRAM W/LOWER EXTREMITY N/A 06/12/2021   Procedure: ABDOMINAL AORTOGRAM W/LOWER EXTREMITY;  Surgeon: Iran Ouch, MD;  Location: [facility] CV LAB;  Service: Cardiovascular;  Laterality: N/A;   AMPUTATION Left 07/03/2021   Procedure: LEFT BELOW KNEE AMPUTATION;  Surgeon: Nadara Mustard, MD;  Location: [facility] OR;  Service: Orthopedics;  Laterality: Left;   CATARACT EXTRACTION  05/31/2015   Eye/right   COLONOSCOPY  02/05/2005, 03/06/2005   normal, 10 years ago   MOHS micrographic surgery     Face surgery   ORIF ACETABULAR FRACTURE Right 10/09/2020   Procedure: OPEN REDUCTION INTERNAL FIXATION (ORIF) ACETABULAR FRACTURE;  Surgeon: [name],  MD;  Location: [facility] OR;  Service: Orthopedics;  Laterality: Right;    No Known Allergies  Outpatient Encounter Medications as of 12/16/2021  Medication Sig   acetaminophen (TYLENOL) 500 MG tablet Take 500 mg by mouth 3 (three) times daily as needed for mild pain or moderate pain.   albuterol (VENTOLIN HFA) 108 (90 Base) MCG/ACT inhaler Inhale 2 puffs into the lungs every 4 (four) hours as needed for wheezing or shortness of breath.   Amino Acids-Protein Hydrolys (FEEDING SUPPLEMENT, PRO-STAT SUGAR FREE 64,) LIQD  Take 30 mLs by mouth in the morning and at bedtime.   calcium carbonate (OSCAL) 1500 (600 Ca) MG TABS tablet Take 600 mg of elemental calcium by mouth daily with breakfast.   carbidopa-levodopa (SINEMET CR) 50-200 MG tablet Take 1 tablet by mouth at bedtime.   carbidopa-levodopa (SINEMET IR) 25-100 MG tablet Take 1 tablet by mouth in the morning, at noon, in the evening, and at bedtime. 06:00 AM, 12:00 PM, 06:00 PM, 12:00 AM   ferrous sulfate 325 (65 FE) MG tablet Take 325 mg by mouth every Monday, Wednesday, and Friday.   Multiple Vitamins-Minerals (PRESERVISION AREDS 2+MULTI VIT) CAPS Take 1 capsule by mouth in the morning and at bedtime.   polyethylene glycol (MIRALAX / GLYCOLAX) 17 g packet Take 17 g by mouth every other day.   senna (SENOKOT) 8.6 MG tablet Take 1 tablet by mouth daily.   torsemide (DEMADEX) 20 MG tablet Take 20 mg by mouth in the morning.   vitamin B-12 (CYANOCOBALAMIN) 1000 MCG tablet Take 1,000 mcg by mouth daily.   Vitamin D, Cholecalciferol, 25 MCG (1000 UT) TABS Take 1,000 Units by mouth daily.   zinc oxide 20 % ointment Apply 1 application. topically See admin instructions. To buttocks after every incontinent episode and as needed for redness.   No facility-administered encounter medications on file as of 12/16/2021.    Review of Systems  Unable to perform ROS: Dementia  Constitutional:  Negative for activity change, appetite change, chills, fatigue and fever.  HENT:  Positive for hearing loss. Negative for congestion and trouble swallowing.   Eyes:  Negative for visual disturbance.  Respiratory:  Negative for cough, shortness of breath and wheezing.   Cardiovascular:  Negative for chest pain and leg swelling.  Gastrointestinal:  Negative for abdominal distention, abdominal pain, constipation, diarrhea, nausea and vomiting.  Genitourinary:  Negative for dysuria, frequency and hematuria.  Musculoskeletal:  Positive for arthralgias and gait problem.  Skin:  Positive  for color change and wound.  Neurological:  Positive for tremors and weakness. Negative for dizziness and headaches.  Psychiatric/Behavioral:  Negative for confusion, dysphoric mood and sleep disturbance. The patient is not nervous/anxious.     Immunization History  Administered Date(s) Administered   H1N1 06/13/2008   Influenza Split 03/05/2006, 02/26/2007, 01/26/2008, 01/18/2009, 12/12/2009, 01/16/2011, 01/29/2012, 02/01/2013, 02/08/2014, 02/13/2015, 01/10/2016, 02/23/2017, 02/24/2018, 01/24/2019, 12/22/2019   Influenza, High Dose Seasonal PF 01/30/2021   Moderna SARS-COV2 Booster Vaccination 09/18/2021   PFIZER(Purple Top)SARS-COV-2 Vaccination 05/04/2019, 05/25/2019, 09/01/2020   Pfizer Covid-19 Vaccine Bivalent Booster 36yrs & up 12/27/2019, 12/26/2020   Pneumococcal Conjugate-13 02/10/2014   Pneumococcal Polysaccharide-23 06/10/2011   Zoster, Live 07/04/2009   Pertinent  Health Maintenance Due  Topic Date Due   INFLUENZA VACCINE  11/05/2021      07/03/2021    5:00 PM 07/03/2021    7:16 PM 07/04/2021    9:30 AM 07/04/2021    7:18 PM 07/05/2021    7:15 AM  Fall Risk  Patient Fall Risk Level Moderate fall risk Moderate fall risk Moderate fall risk Moderate fall risk High fall risk   Functional Status Survey:    Vitals:   12/16/21 1120  BP: (!) 156/75  Pulse: 78  Resp: 20  Temp: (!) 97 F (36.1 C)  SpO2: 97%  Weight: 181 lb 1.6 oz (82.1 kg)  Height: 5\' 9"  (1.753 m)   Body mass index is 26.74 kg/m. Physical Exam Vitals reviewed.  Constitutional:      General: He is not in acute distress. HENT:     Head: Normocephalic.     Right Ear: There is no impacted cerumen.     Left Ear: There is no impacted cerumen.     Nose: Nose normal.     Mouth/Throat:     Mouth: Mucous membranes are moist.  Eyes:     General:        Right eye: No discharge.        Left eye: No discharge.  Cardiovascular:     Rate and Rhythm: Normal rate and regular rhythm.     Pulses: Normal  pulses.     Heart sounds: Murmur heard.  Pulmonary:     Effort: Pulmonary effort is normal. No respiratory distress.     Breath sounds: Normal breath sounds. No wheezing.  Abdominal:     General: Bowel sounds are normal. There is no distension.     Palpations: Abdomen is soft.     Tenderness: There is no abdominal tenderness.  Musculoskeletal:     Cervical back: Neck supple.     Right lower leg: No edema.     Comments: Left BKA, stump with no skin breakdown  Skin:    General: Skin is warm and dry.     Capillary Refill: Capillary refill takes less than 2 seconds.     Comments: Approx 1 cm lesion to posterior scalp, raised, non tender, no granulation tissue present, surrounding skin intact, no sign of infection  Neurological:     General: No focal deficit present.     Mental Status: He is alert and oriented to person, place, and time.     Motor: Weakness present.     Gait: Gait abnormal.     Comments: LLE prosthesis, ambulates with walker/wheelchair  Psychiatric:        Mood and Affect: Mood normal.        Behavior: Behavior normal.     Labs reviewed: Recent Labs    06/04/21 0957 06/20/21 1120 07/03/21 0913 07/08/21 0000 07/18/21 0000  NA 138 136 136 139 137  K 4.4 4.6 4.4 4.2 4.5  CL 97 99 102 102 100  CO2 29 27 26  29* 32*  GLUCOSE 83 121* 96  --   --   BUN 35* 32* 34* 35* 34*  CREATININE 1.49* 1.51* 1.49* 1.1 1.1  CALCIUM 8.9 9.1 9.0 8.5* 8.9  MG  --   --  2.2  --   --    Recent Labs    12/27/20 0000 06/20/21 1120 07/03/21 0913 07/08/21 0000  AST 18 12 14* 12*  ALT 6* 6* 6 3*  ALKPHOS 46  --  48 45  BILITOT  --  0.6 0.5  --   PROT  --  6.9 6.9  --   ALBUMIN 2.9*  --  3.4* 3.2*   Recent Labs    06/20/21 1120 07/03/21 1618 07/04/21 0128 07/05/21 0302 07/18/21 0000 07/25/21 0000 10/31/21 0000  WBC 7.1 7.4 8.8  6.9 5.5 6.0 6.3  NEUTROABS 5,375 6.6  --   --  3,471.00  --   --   HGB 10.9* 9.5* 7.6* 8.3* 9.8* 10.3* 13.2*  HCT 33.9* 29.6* 24.0* 25.6* 30*  32* 41  MCV 91.1 91.9 92.0 89.8  --   --   --   PLT 234 207 179 198 276 242 217   No results found for: "TSH" Lab Results  Component Value Date   HGBA1C 5.8 (H) 07/03/2021   No results found for: "CHOL", "HDL", "LDLCALC", "LDLDIRECT", "TRIG", "CHOLHDL"  Significant Diagnostic Results in last 30 days:  No results found.  Assessment/Plan 1. S/P below knee amputation, left ([place]) -  surgery 03/29 by Dr. Sharol Given due to ongoing osteomyelitis (+MRSA) of left heel - denies phantom pain - ambulating well with prosthesis - cont skilled nursing - cont PT/OT  2. Parkinson's disease ([place]) - followed by neurology - cont sinemet  3. Bilateral leg edema - cont torsemide  4. DVT of deep femoral vein, right ([place]) - Eliquis started 12/2020 - plan to d/c when more ambulatory   5. Skin lesion of scalp - scheduled with in house dermatology 01/09/2022  6. Primary hypertension - controlled without medication  7. Stage 3b chronic kidney disease ([place]) -avoid nephrotoxic drugs like NSAIDS and dose adjust medications to be renally excreted - encourage hydration with water   8. Prediabetes - A1c stable  9. Blood loss anemia - hgb stable    Family/ staff Communication: plan discussed with patient and nurse  Labs/tests ordered:  none, routine labs 01/2022

## 2021-12-30 ENCOUNTER — Encounter: Payer: Self-pay | Admitting: Orthopedic Surgery

## 2021-12-30 ENCOUNTER — Non-Acute Institutional Stay (SKILLED_NURSING_FACILITY): Payer: Medicare Other | Admitting: Orthopedic Surgery

## 2021-12-30 DIAGNOSIS — I82411 Acute embolism and thrombosis of right femoral vein: Secondary | ICD-10-CM

## 2021-12-30 DIAGNOSIS — D500 Iron deficiency anemia secondary to blood loss (chronic): Secondary | ICD-10-CM

## 2021-12-30 DIAGNOSIS — L989 Disorder of the skin and subcutaneous tissue, unspecified: Secondary | ICD-10-CM

## 2021-12-30 DIAGNOSIS — I10 Essential (primary) hypertension: Secondary | ICD-10-CM

## 2021-12-30 DIAGNOSIS — R600 Localized edema: Secondary | ICD-10-CM | POA: Diagnosis not present

## 2021-12-30 DIAGNOSIS — R7303 Prediabetes: Secondary | ICD-10-CM

## 2021-12-30 DIAGNOSIS — Z89512 Acquired absence of left leg below knee: Secondary | ICD-10-CM | POA: Diagnosis not present

## 2021-12-30 DIAGNOSIS — G20 Parkinson's disease: Secondary | ICD-10-CM

## 2021-12-30 DIAGNOSIS — N1832 Chronic kidney disease, stage 3b: Secondary | ICD-10-CM

## 2021-12-30 DIAGNOSIS — R058 Other specified cough: Secondary | ICD-10-CM | POA: Diagnosis not present

## 2021-12-30 DIAGNOSIS — G20A1 Parkinson's disease without dyskinesia, without mention of fluctuations: Secondary | ICD-10-CM

## 2021-12-30 NOTE — Progress Notes (Signed)
Location:  [place] Room Number: 27/A Place of Service:  SNF [id number]) Provider:  Yvonna Alanis, NP   Virgie Dad, MD  Patient Care Team: Virgie Dad, MD as PCP - General (Internal Medicine) [name], Eustace Quail, DO as Consulting Physician (Neurology)  Extended Emergency Contact Information Primary Emergency Contact: Blackston,jane Address: [place].          apt [phone] Johnnette Litter of [place] Phone: [phone] Mobile Phone: [phone] Relation: Spouse Secondary Emergency Contact: Shirah,kenneth Home Phone: [phone] Mobile Phone: [phone] Relation: Son  Code Status:  Full code Goals of care: Advanced Directive information    11/18/2021   11:00 AM  Advanced Directives  Does Patient Have a Medical Advance Directive? Yes  Type of Advance Directive Living will  Does patient want to make changes to medical advance directive? No - Patient declined     Chief Complaint  Patient presents with   Acute Visit    Productive cough    HPI:  Pt is a 88 y.o. male seen today for acute visit due to productive cough.   Productive cough began 09/20. Covid test 09/22 negative. He denies chest pain, sob, sore throat, malaise, or body aches. Mild fever at times. Productive cough with loose tan sputum. Talking and deep breaths stimulates cough. H/o heart failure. No weight fluctuations, or ankle edema. He is taking torsemide daily. He is receiving robitussin prn and cough improves.   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 Sharol Given due to ongoing osteomyelitis (+MRSA) of left heel, doing well with PT/OT, denies phantom pain, ambulates with prosthesis- continues to work with PT, remains on Eliquis for DVT prophylaxis Parkinson's-  diagnosed 2019, followed by neurology, remains on sinemet BLE- LV EF 60-65% 02/20/2021, improved since amputation, remains on torsemide  DVT of deep femoral vein- diagnosed 12/2020, remains on Eliquis Skin lesion- lesion  to back of head, scheduled to see dermatology 10/05 HTN- BUN/creat 34/1.1 07/18/2021, coreg discontinued due to hypotension CKD 3b- see above Prediabetes- - A1c 5.8 07/03/2021 Anemia- hgb 13.2 10/31/2021, remains on ferrous sulfate M/W/F   Past Medical History:  Diagnosis Date   Acute bronchiolitis    Acute osteomyelitis of calcaneum, left ([place]) 01/11/2021   Anemia    Bursitis of both hips    Complication of anesthesia    wife states pt was a little "loopy" for several days   Dementia ([place])    due to Parkinson's   Hearing reduced    hearing aid   History of basal cell carcinoma    Hyperlipidemia    Malaise and fatigue    MRSA infection 01/11/2021   Osteoarthritis    hand   Osteoporosis    Palpitation    Parkinson's disease ([place])    Peripheral vascular disease ([place])    blood clots   Pre-diabetes    Vitamin D deficiency    Past Surgical History:  Procedure Laterality Date   ABDOMINAL AORTOGRAM W/LOWER EXTREMITY N/A 06/12/2021   Procedure: ABDOMINAL AORTOGRAM W/LOWER EXTREMITY;  Surgeon: Wellington Hampshire, MD;  Location: [facility];  Service: Cardiovascular;  Laterality: N/A;   AMPUTATION Left 07/03/2021   Procedure: LEFT BELOW KNEE AMPUTATION;  Surgeon: Newt Minion, MD;  Location: [facility];  Service: Orthopedics;  Laterality: Left;   CATARACT EXTRACTION  05/31/2015   Eye/right   COLONOSCOPY  02/05/2005, 03/06/2005   normal, 10 years ago   MOHS micrographic surgery     Face  surgery   ORIF ACETABULAR FRACTURE Right 10/09/2020   Procedure: OPEN REDUCTION INTERNAL FIXATION (ORIF) ACETABULAR FRACTURE;  Surgeon: Altamese Weston, MD;  Location: [facility];  Service: Orthopedics;  Laterality: Right;    No Known Allergies  Outpatient Encounter Medications as of 12/30/2021  Medication Sig   acetaminophen (TYLENOL) 500 MG tablet Take 500 mg by mouth 3 (three) times daily as needed for mild pain or moderate pain.   albuterol (VENTOLIN HFA) 108 (90 Base) MCG/ACT inhaler Inhale 2  puffs into the lungs every 4 (four) hours as needed for wheezing or shortness of breath.   Amino Acids-Protein Hydrolys (FEEDING SUPPLEMENT, PRO-STAT SUGAR FREE 64,) LIQD Take 30 mLs by mouth in the morning and at bedtime.   calcium carbonate (OSCAL) 1500 (600 Ca) MG TABS tablet Take 600 mg of elemental calcium by mouth daily with breakfast.   carbidopa-levodopa (SINEMET CR) 50-200 MG tablet Take 1 tablet by mouth at bedtime.   carbidopa-levodopa (SINEMET IR) 25-100 MG tablet Take 1 tablet by mouth in the morning, at noon, in the evening, and at bedtime. 06:00 AM, 12:00 PM, 06:00 PM, 12:00 AM   ferrous sulfate 325 (65 FE) MG tablet Take 325 mg by mouth every Monday, Wednesday, and Friday.   Multiple Vitamins-Minerals (PRESERVISION AREDS 2+MULTI VIT) CAPS Take 1 capsule by mouth in the morning and at bedtime.   polyethylene glycol (MIRALAX / GLYCOLAX) 17 g packet Take 17 g by mouth every other day.   senna (SENOKOT) 8.6 MG tablet Take 1 tablet by mouth daily.   torsemide (DEMADEX) 20 MG tablet Take 20 mg by mouth in the morning.   vitamin B-12 (CYANOCOBALAMIN) 1000 MCG tablet Take 1,000 mcg by mouth daily.   Vitamin D, Cholecalciferol, 25 MCG (1000 UT) TABS Take 1,000 Units by mouth daily.   zinc oxide 20 % ointment Apply 1 application. topically See admin instructions. To buttocks after every incontinent episode and as needed for redness.   No facility-administered encounter medications on file as of 12/30/2021.    Review of Systems  Constitutional:  Negative for activity change, appetite change, chills, fatigue and fever.  HENT:  Negative for congestion, sore throat and trouble swallowing.   Eyes:  Negative for visual disturbance.  Respiratory:  Positive for cough. Negative for shortness of breath and wheezing.   Cardiovascular:  Negative for chest pain and leg swelling.  Gastrointestinal:  Negative for abdominal distention and abdominal pain.  Genitourinary:  Negative for dysuria, frequency  and hematuria.  Musculoskeletal:  Positive for arthralgias, back pain and gait problem.  Skin:  Negative for wound.  Neurological:  Positive for tremors and weakness. Negative for dizziness and headaches.  Psychiatric/Behavioral:  Negative for confusion and dysphoric mood. The patient is not nervous/anxious.     Immunization History  Administered Date(s) Administered   H1N1 06/13/2008   Influenza Split 03/05/2006, 02/26/2007, 01/26/2008, 01/18/2009, 12/12/2009, 01/16/2011, 01/29/2012, 02/01/2013, 02/08/2014, 02/13/2015, 01/10/2016, 02/23/2017, 02/24/2018, 01/24/2019, 12/22/2019   Influenza, High Dose Seasonal PF 01/30/2021   Moderna SARS-COV2 Booster Vaccination 09/18/2021   PFIZER(Purple Top)SARS-COV-2 Vaccination 05/04/2019, 05/25/2019, 09/01/2020   Pfizer Covid-19 Vaccine Bivalent Booster 72yrs & up 12/27/2019, 12/26/2020   Pneumococcal Conjugate-13 02/10/2014   Pneumococcal Polysaccharide-23 06/10/2011   Zoster, Live 07/04/2009   Pertinent  Health Maintenance Due  Topic Date Due   INFLUENZA VACCINE  11/05/2021      07/03/2021    5:00 PM 07/03/2021    7:16 PM 07/04/2021    9:30 AM 07/04/2021    7:18 PM  07/05/2021    7:15 AM  Fall Risk  Patient Fall Risk Level Moderate fall risk Moderate fall risk Moderate fall risk Moderate fall risk High fall risk   Functional Status Survey:    Vitals:   12/30/21 0955  BP: (!) 103/59  Pulse: 75  Resp: 16  Temp: 99.1 F (37.3 C)  SpO2: 99%  Weight: 181 lb 1.6 oz (82.1 kg)  Height: 5\' 9"  (1.753 m)   Body mass index is 26.74 kg/m. Physical Exam Vitals reviewed.  Constitutional:      General: He is not in acute distress. HENT:     Head: Normocephalic.     Right Ear: There is no impacted cerumen.     Left Ear: There is no impacted cerumen.     Nose: Nose normal.     Mouth/Throat:     Mouth: Mucous membranes are moist.  Eyes:     General:        Right eye: No discharge.        Left eye: No discharge.  Cardiovascular:     Rate  and Rhythm: Normal rate and regular rhythm.     Pulses: Normal pulses.     Heart sounds: Normal heart sounds.  Pulmonary:     Effort: Pulmonary effort is normal. No respiratory distress.     Breath sounds: Examination of the right-middle field reveals decreased breath sounds. Examination of the left-middle field reveals decreased breath sounds. Decreased breath sounds present. No wheezing or rales.  Abdominal:     General: Bowel sounds are normal. There is no distension.     Palpations: Abdomen is soft.     Tenderness: There is no abdominal tenderness.  Musculoskeletal:     Cervical back: Neck supple.     Right lower leg: No edema.     Comments: Left BKA  Skin:    General: Skin is warm and dry.     Capillary Refill: Capillary refill takes less than 2 seconds.  Neurological:     General: No focal deficit present.     Mental Status: He is alert and oriented to person, place, and time.     Motor: Weakness present.     Gait: Gait abnormal.     Comments: Walker/wheelchair  Psychiatric:        Mood and Affect: Mood normal.        Behavior: Behavior normal.     Labs reviewed: Recent Labs    06/04/21 0957 06/20/21 1120 07/03/21 0913 07/08/21 0000 07/18/21 0000  NA 138 136 136 139 137  K 4.4 4.6 4.4 4.2 4.5  CL 97 99 102 102 100  CO2 29 27 26  29* 32*  GLUCOSE 83 121* 96  --   --   BUN 35* 32* 34* 35* 34*  CREATININE 1.49* 1.51* 1.49* 1.1 1.1  CALCIUM 8.9 9.1 9.0 8.5* 8.9  MG  --   --  2.2  --   --    Recent Labs    06/20/21 1120 07/03/21 0913 07/08/21 0000  AST 12 14* 12*  ALT 6* 6 3*  ALKPHOS  --  48 45  BILITOT 0.6 0.5  --   PROT 6.9 6.9  --   ALBUMIN  --  3.4* 3.2*   Recent Labs    06/20/21 1120 07/03/21 1618 07/04/21 0128 07/05/21 0302 07/18/21 0000 07/25/21 0000 10/31/21 0000  WBC 7.1 7.4 8.8 6.9 5.5 6.0 6.3  NEUTROABS 5,375 6.6  --   --  3,471.00  --   --  HGB 10.9* 9.5* 7.6* 8.3* 9.8* 10.3* 13.2*  HCT 33.9* 29.6* 24.0* 25.6* 30* 32* 41  MCV 91.1  91.9 92.0 89.8  --   --   --   PLT 234 207 179 198 276 242 217   No results found for: "TSH" Lab Results  Component Value Date   HGBA1C 5.8 (H) 07/03/2021   No results found for: "CHOL", "HDL", "LDLCALC", "LDLDIRECT", "TRIG", "CHOLHDL"  Significant Diagnostic Results in last 30 days:  No results found.  Assessment/Plan 1. Productive cough - onset 09/20, sputum tan, mild fever - no other cold symptoms - diminished middle bases, otherwise clear - covid negative 09/22 - CXR- mild cardiomegaly with mildly elevated pulmonary vasculature, no infiltrates or acute findings - start torsemide 10 mg po QAM x 2 days  2. S/P below knee amputation, left ([place]) - ambulating well with prosthesis - cont skilled nursing - cont PT/OT  3. Parkinson's disease ([place]) - cont sinemet  4. Bilateral leg edema - cont torsemide  5. DVT of deep femoral vein, right ([place]) - Eliquis started 12/2020 - plan to d/c when more ambulatory  6. Skin lesion of scalp - scheduled with dermatology 10/05  7. Primary hypertension - off coreg due to hypotension  8. Stage 3b chronic kidney disease ([place]) - avoid nephrotoxic drugs like NSAIDS and dose adjust medications to be renally excreted - encourage hydration with water   9. Prediabetes - A1c stable - not on medication, diet controlled  10. Blood loss anemia - hgb stable - cont ferrous sulfate M/W/F    Family/ staff Communication: plan discussed with patient and nurse  Labs/tests ordered:  CXR

## 2022-01-07 ENCOUNTER — Non-Acute Institutional Stay (SKILLED_NURSING_FACILITY): Payer: Medicare Other | Admitting: Adult Health

## 2022-01-07 ENCOUNTER — Encounter: Payer: Self-pay | Admitting: Adult Health

## 2022-01-07 DIAGNOSIS — Z89512 Acquired absence of left leg below knee: Secondary | ICD-10-CM | POA: Diagnosis not present

## 2022-01-07 DIAGNOSIS — G20A1 Parkinson's disease without dyskinesia, without mention of fluctuations: Secondary | ICD-10-CM

## 2022-01-07 DIAGNOSIS — B027 Disseminated zoster: Secondary | ICD-10-CM | POA: Diagnosis not present

## 2022-01-07 MED ORDER — FAMCICLOVIR 500 MG PO TABS: 500.0000 mg | ORAL_TABLET | Freq: Three times a day (TID) | ORAL | 0 refills | Status: AC

## 2022-01-07 NOTE — Progress Notes (Signed)
Location:  [facility]illed Nursing Facility [facility]  Nursing Home Room Number: 27/A Place of Service:  SNF [id number]) Provider:  Lilyan Punt, MD  Patient Care Team: [name], MD as PCP - General (Internal Medicine) [name], Octaviano Batty, DO as Consulting Physician (Neurology)  Extended Emergency Contact Information Primary Emergency Contact: Klinge,jane Address: [address] [place].          apt [phone]  [phone] Darden Amber of [place] Home Phone: [phone] Mobile Phone: [phone] Relation: Spouse Secondary Emergency Contact: Wiltsie,kenneth Home Phone: [phone] Mobile Phone: [phone] Relation: Son  Code Status: Full Code  Goals of care: Advanced Directive information    01/07/2022   11:41 AM  Advanced Directives  Does Patient Have a Medical Advance Directive? Yes  Type of Estate agent of [place];Living will  Does patient want to make changes to medical advance directive? No - Patient declined  Copy of Healthcare Power of Attorney in Chart? Yes - validated most recent copy scanned in chart (See row information)     Chief Complaint  Patient presents with   Acute Visit    NSG home, review medications. Rash with blisters.    HPI:  Pt is a 88 y.o. male seen today for an acute visit for  rashes. He is a long-term care resident of [facility]. He has a PMH of Parkinson's disease and osteoporosis. He was seen today in his room with wife at bedside. He was noted to have erythematous rashes with blisters on his left chest, shoulder and back. He denies pain nor itching. Rashes appeared yesterday. No reported fever nor chills. Wife stated that resident has high pain tolerance.  He takes Sinemet for Parkinson's disease.  He is S/P left BKA  on 07/03/21 due to osteomyelitis of left foot.   Past Medical History:  Diagnosis Date   Acute bronchiolitis    Acute osteomyelitis of calcaneum, left ([facility]) 01/11/2021    Anemia    Bursitis of both hips    Complication of anesthesia    wife states pt was a little "loopy" for several days   Dementia (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   Hearing reduced    hearing aid   History of basal cell carcinoma    Hyperlipidemia    Malaise and fatigue    MRSA infection 01/11/2021   Osteoarthritis    hand   Osteoporosis    Palpitation    Parkinson's disease    Peripheral vascular disease ([facility])    blood clots   Pre-diabetes    Vitamin D deficiency    Past Surgical History:  Procedure Laterality Date   ABDOMINAL AORTOGRAM W/LOWER EXTREMITY N/A 06/12/2021   Procedure: ABDOMINAL AORTOGRAM W/LOWER EXTREMITY;  Surgeon: Iran Ouch, MD;  Location: [facility];  Service: Cardiovascular;  Laterality: N/A;   AMPUTATION Left 07/03/2021   Procedure: LEFT BELOW KNEE AMPUTATION;  Surgeon: Nadara Mustard, MD;  Location: [facility] OR;  Service: Orthopedics;  Laterality: Left;   CATARACT EXTRACTION  05/31/2015   Eye/right   COLONOSCOPY  02/05/2005, 03/06/2005   normal, 10 years ago   MOHS micrographic surgery     Face surgery   ORIF ACETABULAR FRACTURE Right 10/09/2020   Procedure: OPEN REDUCTION INTERNAL FIXATION (ORIF) ACETABULAR FRACTURE;  Surgeon: [name], MD;  Location: [facility] OR;  Service: Orthopedics;  Laterality: Right;    No Known Allergies  Allergies as of 01/07/2022   No Known Allergies  Medication List        Accurate as of January 07, 2022 11:49 AM. If you have any questions, ask your nurse or doctor.          acetaminophen 500 MG tablet Commonly known as: TYLENOL Take 1,000 mg by mouth 3 (three) times daily as needed for mild pain or moderate pain.   albuterol 108 (90 Base) MCG/ACT inhaler Commonly known as: VENTOLIN HFA Inhale 2 puffs into the lungs every 4 (four) hours as needed for wheezing or shortness of breath.   calcium carbonate 1500 (600 Ca) MG Tabs tablet Commonly known as: OSCAL Take 600 mg of elemental calcium by mouth daily  with breakfast.   carbidopa-levodopa 25-100 MG tablet Commonly known as: SINEMET IR Take 1 tablet by mouth in the morning, at noon, in the evening, and at bedtime. 06:00 AM, 12:00 PM, 06:00 PM, 12:00 AM   carbidopa-levodopa 50-200 MG tablet Commonly known as: SINEMET CR Take 1 tablet by mouth at bedtime.   cyanocobalamin 1000 MCG tablet Commonly known as: VITAMIN B12 Take 1,000 mcg by mouth daily.   famciclovir 500 MG tablet Commonly known as: FAMVIR Take 1 tablet (500 mg total) by mouth 3 (three) times daily for 10 days. Started by: Kenard Gower, NP   feeding supplement (PRO-STAT SUGAR FREE 64) Liqd Take 30 mLs by mouth in the morning and at bedtime.   ferrous sulfate 325 (65 FE) MG tablet Take 325 mg by mouth every Monday, Wednesday, and Friday.   polyethylene glycol 17 g packet Commonly known as: MIRALAX / GLYCOLAX Take 17 g by mouth every other day.   PreserVision AREDS 2+Multi Vit Caps Take 1 capsule by mouth in the morning and at bedtime.   senna 8.6 MG tablet Commonly known as: SENOKOT Take 1 tablet by mouth daily.   torsemide 20 MG tablet Commonly known as: DEMADEX Take 20 mg by mouth in the morning.   Vitamin D (Cholecalciferol) 25 MCG (1000 UT) Tabs Take 1,000 Units by mouth daily.   zinc oxide 20 % ointment Apply 1 application. topically See admin instructions. To buttocks after every incontinent episode and as needed for redness.        Review of Systems  Constitutional:  Negative for activity change, appetite change and fever.  HENT:  Negative for sore throat.   Eyes: Negative.   Cardiovascular:  Negative for chest pain and leg swelling.  Gastrointestinal:  Negative for abdominal distention, diarrhea and vomiting.  Genitourinary:  Negative for dysuria, frequency and urgency.  Skin:  Positive for rash. Negative for color change.  Neurological:  Negative for dizziness and headaches.  Psychiatric/Behavioral:  Negative for behavioral  problems and sleep disturbance. The patient is not nervous/anxious.     Immunization History  Administered Date(s) Administered   H1N1 06/13/2008   Influenza Split 03/05/2006, 02/26/2007, 01/26/2008, 01/18/2009, 12/12/2009, 01/16/2011, 01/29/2012, 02/01/2013, 02/08/2014, 02/13/2015, 01/10/2016, 02/23/2017, 02/24/2018, 01/24/2019, 12/22/2019   Influenza, High Dose Seasonal PF 01/30/2021   Moderna SARS-COV2 Booster Vaccination 09/18/2021   PFIZER(Purple Top)SARS-COV-2 Vaccination 05/04/2019, 05/25/2019, 09/01/2020   Pfizer Covid-19 Vaccine Bivalent Booster 78yrs & up 12/27/2019, 12/26/2020   Pneumococcal Conjugate-13 02/10/2014   Pneumococcal Polysaccharide-23 06/10/2011   Zoster, Live 07/04/2009   Pertinent  Health Maintenance Due  Topic Date Due   INFLUENZA VACCINE  11/05/2021      07/03/2021    5:00 PM 07/03/2021    7:16 PM 07/04/2021    9:30 AM 07/04/2021    7:18 PM 07/05/2021  7:15 AM  Fall Risk  Patient Fall Risk Level Moderate fall risk Moderate fall risk Moderate fall risk Moderate fall risk High fall risk   Functional Status Survey:    Vitals:   01/07/22 1136  BP: (!) 148/78  Pulse: 74  Resp: 17  Temp: 97.7 F (36.5 C)  SpO2: 92%  Weight: 181 lb 1.6 oz (82.1 kg)  Height: 5\' 9"  (3.710 m)   Body mass index is 26.74 kg/m. Physical Exam Constitutional:      Appearance: Normal appearance.  HENT:     Head: Normocephalic and atraumatic.     Mouth/Throat:     Mouth: Mucous membranes are moist.  Eyes:     Conjunctiva/sclera: Conjunctivae normal.  Cardiovascular:     Rate and Rhythm: Normal rate and regular rhythm.     Pulses: Normal pulses.     Heart sounds: Normal heart sounds.  Pulmonary:     Effort: Pulmonary effort is normal.     Breath sounds: Normal breath sounds.  Abdominal:     General: Bowel sounds are normal.     Palpations: Abdomen is soft.  Musculoskeletal:        General: No swelling. Normal range of motion.     Cervical back: Normal range of  motion.     Comments: Left BKA  Skin:    General: Skin is warm and dry.     Findings: Rash present.     Comments: Has erythematous rashes with blisters on left chest, shoulder and back.  Neurological:     General: No focal deficit present.     Mental Status: He is alert and oriented to person, place, and time.  Psychiatric:        Mood and Affect: Mood normal.        Behavior: Behavior normal.        Thought Content: Thought content normal.        Judgment: Judgment normal.     Labs reviewed: Recent Labs    06/04/21 0957 06/20/21 1120 07/03/21 0913 07/08/21 0000 07/18/21 0000  NA 138 136 136 139 137  K 4.4 4.6 4.4 4.2 4.5  CL 97 99 102 102 100  CO2 29 27 26  29* 32*  GLUCOSE 83 121* 96  --   --   BUN 35* 32* 34* 35* 34*  CREATININE 1.49* 1.51* 1.49* 1.1 1.1  CALCIUM 8.9 9.1 9.0 8.5* 8.9  MG  --   --  2.2  --   --    Recent Labs    06/20/21 1120 07/03/21 0913 07/08/21 0000  AST 12 14* 12*  ALT 6* 6 3*  ALKPHOS  --  48 45  BILITOT 0.6 0.5  --   PROT 6.9 6.9  --   ALBUMIN  --  3.4* 3.2*   Recent Labs    06/20/21 1120 07/03/21 1618 07/04/21 0128 07/05/21 0302 07/18/21 0000 07/25/21 0000 10/31/21 0000  WBC 7.1 7.4 8.8 6.9 5.5 6.0 6.3  NEUTROABS 5,375 6.6  --   --  3,471.00  --   --   HGB 10.9* 9.5* 7.6* 8.3* 9.8* 10.3* 13.2*  HCT 33.9* 29.6* 24.0* 25.6* 30* 32* 41  MCV 91.1 91.9 92.0 89.8  --   --   --   PLT 234 207 179 198 276 242 217   No results found for: "TSH" Lab Results  Component Value Date   HGBA1C 5.8 (H) 07/03/2021   No results found for: "CHOL", "HDL", "LDLCALC", "LDLDIRECT", "TRIG", "CHOLHDL"  Significant Diagnostic Results in last 30 days:  No results found.  Assessment/Plan 1. Disseminated herpes zoster -  rashes appeared yesterday, denies pain nor itch -  instructed on contact precautions - famciclovir (FAMVIR) 500 MG tablet; Take 1 tablet (500 mg total) by mouth 3 (three) times daily for 10 days.  Dispense: 30 tablet; Refill:  0  2. Parkinson's disease, unspecified whether dyskinesia present, unspecified whether manifestations fluctuate -  continue Sinemet -  fall precautions  3. S/P below knee amputation, left ([facility]) -  continue supportive care -  fall precautions    Family/ staff Communication:  Discussed plan of care with resident and charge nurse.  Labs/tests ordered:   None

## 2022-01-07 NOTE — Patient Instructions (Signed)

## 2022-01-15 ENCOUNTER — Encounter: Payer: Self-pay | Admitting: Orthopedic Surgery

## 2022-01-15 ENCOUNTER — Non-Acute Institutional Stay (SKILLED_NURSING_FACILITY): Payer: Medicare Other | Admitting: Orthopedic Surgery

## 2022-01-15 DIAGNOSIS — I82411 Acute embolism and thrombosis of right femoral vein: Secondary | ICD-10-CM

## 2022-01-15 DIAGNOSIS — B027 Disseminated zoster: Secondary | ICD-10-CM | POA: Diagnosis not present

## 2022-01-15 DIAGNOSIS — R600 Localized edema: Secondary | ICD-10-CM

## 2022-01-15 DIAGNOSIS — G20A1 Parkinson's disease without dyskinesia, without mention of fluctuations: Secondary | ICD-10-CM | POA: Diagnosis not present

## 2022-01-15 DIAGNOSIS — Z89512 Acquired absence of left leg below knee: Secondary | ICD-10-CM | POA: Diagnosis not present

## 2022-01-15 NOTE — Progress Notes (Signed)
Location:  [facility] Room Number: 27/A Place of Service:  SNF [phone]) Provider:  Octavia Heir, NP   [name], MD  Patient Care Team: [name], MD as PCP - General (Internal Medicine) [name], Octaviano Batty, DO as Consulting Physician (Neurology)  Extended Emergency Contact Information Primary Emergency Contact: Lydon,jane Address: [address] [place].          apt [phone] Darden Amber of [place] Home Phone: [phone] Mobile Phone: [phone] Relation: Spouse Secondary Emergency Contact: Galicia,kenneth Home Phone: [phone] Mobile Phone: [phone] Relation: Son  Code Status: Full code Goals of care: Advanced Directive information    01/07/2022   11:41 AM  Advanced Directives  Does Patient Have a Medical Advance Directive? Yes  Type of Estate agent of [place];Living will  Does patient want to make changes to medical advance directive? No - Patient declined  Copy of Healthcare Power of Attorney in Chart? Yes - validated most recent copy scanned in chart (See row information)     Chief Complaint  Patient presents with   Acute Visit    Shingles rash    HPI:  Pt is a 88 y.o. male seen today for acute visit due to recent shingles rash.   10/03 he was diagnosed with shingles. Erythematous rash with blisters to left chest/shoulder and upper back. He was prescribed famcyclovir x 10 days. He has denied pain to left chest/shoulder/back since diagnosis. Denies pain today. He has completed zoster vaccine. Wife requesting Shingrix vaccine once flu and covid vaccines have been administered.   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  Past Medical History:  Diagnosis Date   Acute bronchiolitis    Acute osteomyelitis of calcaneum, left (HCC) 01/11/2021   Anemia    Bursitis of both hips    Complication of anesthesia    wife states pt was a little "loopy" for several days   Dementia (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   Hearing reduced    hearing aid   History of basal cell carcinoma    Hyperlipidemia    Malaise and fatigue    MRSA infection 01/11/2021   Osteoarthritis    hand   Osteoporosis    Palpitation    Parkinson's disease    Peripheral vascular disease (HCC)    blood clots   Pre-diabetes    Vitamin D deficiency    Past Surgical History:  Procedure Laterality Date   ABDOMINAL AORTOGRAM W/LOWER EXTREMITY N/A 06/12/2021   Procedure: ABDOMINAL AORTOGRAM W/LOWER EXTREMITY;  Surgeon: Iran Ouch, MD;  [facility]: [facility];  Service: Cardiovascular;  Laterality: N/A;   AMPUTATION Left 07/03/2021   Procedure: LEFT BELOW KNEE AMPUTATION;  Surgeon: Nadara Mustard, MD;  Location: [facility] OR;  Service: Orthopedics;  Laterality: Left;   CATARACT EXTRACTION  05/31/2015   Eye/right   COLONOSCOPY  02/05/2005, 03/06/2005   normal, 10 years ago   MOHS micrographic surgery     Face surgery   ORIF ACETABULAR FRACTURE Right 10/09/2020   Procedure: OPEN REDUCTION INTERNAL FIXATION (ORIF) ACETABULAR FRACTURE;  Surgeon: [name], MD;  Location: [facility] OR;  Service: Orthopedics;  Laterality: Right;    No Known Allergies  Outpatient Encounter Medications as of 01/15/2022  Medication Sig   acetaminophen (TYLENOL) 500 MG tablet Take 1,000 mg by mouth 3 (three) times daily as needed for mild pain or moderate pain.   albuterol (VENTOLIN HFA) 108 (90 Base) MCG/ACT inhaler Inhale 2 puffs into the lungs every 4 (four) hours as needed for wheezing or shortness of breath.   Amino Acids-Protein Hydrolys (FEEDING SUPPLEMENT, PRO-STAT SUGAR FREE 64,) LIQD  Take 30 mLs by mouth in the morning and at bedtime.   calcium carbonate (OSCAL) 1500 (600 Ca) MG TABS tablet Take 600 mg of elemental calcium by mouth daily with breakfast.   carbidopa-levodopa (SINEMET CR) 50-200 MG tablet Take 1 tablet by mouth at bedtime.   carbidopa-levodopa (SINEMET IR) 25-100 MG tablet Take 1 tablet by mouth in the morning, at noon, in the evening, and at bedtime. 06:00 AM, 12:00 PM, 06:00 PM, 12:00 AM   famciclovir (FAMVIR) 500 MG tablet Take 1 tablet (500 mg total) by mouth 3 (three) times daily for 10 days.   ferrous sulfate 325 (65 FE) MG tablet Take 325 mg by mouth every Monday, Wednesday, and Friday.   Multiple Vitamins-Minerals (PRESERVISION AREDS 2+MULTI VIT) CAPS Take 1 capsule by mouth in the morning and at bedtime.   polyethylene glycol (MIRALAX / GLYCOLAX) 17 g packet Take 17 g by mouth every other day.   senna (SENOKOT) 8.6 MG tablet Take 1 tablet by mouth daily.   torsemide (DEMADEX) 20 MG tablet Take 20 mg by mouth in the morning.   vitamin B-12 (CYANOCOBALAMIN) 1000 MCG tablet Take 1,000 mcg by mouth daily.   Vitamin D, Cholecalciferol, 25 MCG (1000 UT) TABS Take 1,000 Units by mouth daily.   zinc oxide 20 % ointment Apply 1 application. topically See admin instructions. To buttocks after every incontinent episode and as needed for redness.   No facility-administered encounter medications on file as of 01/15/2022.    Review of Systems  Constitutional:  Negative for activity change, appetite change, chills, fatigue and fever.  HENT:  Negative for congestion and trouble swallowing.   Eyes:  Negative for visual disturbance.  Respiratory:  Negative for cough, shortness of breath and wheezing.   Cardiovascular:  Positive for leg swelling. Negative for chest pain.  Gastrointestinal:  Positive for constipation. Negative for abdominal distention, abdominal pain, diarrhea, nausea and vomiting.  Genitourinary:  Negative for dysuria, frequency and hematuria.   Musculoskeletal:  Positive for back pain and gait problem. Negative for myalgias.  Skin:  Positive for rash.  Neurological:  Positive for tremors and weakness. Negative for dizziness and headaches.  Psychiatric/Behavioral:  Negative for confusion, dysphoric mood and sleep disturbance. The patient is not nervous/anxious.     Immunization History  Administered Date(s) Administered   H1N1 06/13/2008   Influenza Split 03/05/2006, 02/26/2007, 01/26/2008, 01/18/2009, 12/12/2009, 01/16/2011, 01/29/2012, 02/01/2013, 02/08/2014, 02/13/2015, 01/10/2016, 02/23/2017, 02/24/2018, 01/24/2019, 12/22/2019   Influenza, High Dose Seasonal PF 01/30/2021   Moderna SARS-COV2 Booster Vaccination 09/18/2021   PFIZER(Purple Top)SARS-COV-2 Vaccination 05/04/2019, 05/25/2019, 09/01/2020   Pfizer Covid-19 Vaccine Bivalent Booster 14yrs & up 12/27/2019, 12/26/2020   Pneumococcal Conjugate-13 02/10/2014   Pneumococcal Polysaccharide-23 06/10/2011   Zoster, Live 07/04/2009   Pertinent  Health Maintenance Due  Topic Date Due   INFLUENZA VACCINE  11/05/2021      07/03/2021    5:00 PM 07/03/2021    7:16 PM 07/04/2021    9:30 AM 07/04/2021    7:18  PM 07/05/2021    7:15 AM  Fall Risk  Patient Fall Risk Level Moderate fall risk Moderate fall risk Moderate fall risk Moderate fall risk High fall risk   Functional Status Survey:    Vitals:   01/15/22 1138  BP: 126/72  Pulse: 87  Resp: 20  Temp: (!) 97.5 F (36.4 C)  SpO2: 95%  Weight: 183 lb 12.8 oz (83.4 kg)  Height: 5\' 9"  (1.753 m)   Body mass index is 27.14 kg/m. Physical Exam Vitals reviewed.  Constitutional:      General: He is not in acute distress. HENT:     Head: Normocephalic.  Eyes:     General:        Right eye: No discharge.        Left eye: No discharge.  Cardiovascular:     Rate and Rhythm: Normal rate and regular rhythm.     Pulses: Normal pulses.     Heart sounds: Murmur heard.  Pulmonary:     Effort: Pulmonary effort is normal.  No respiratory distress.     Breath sounds: Normal breath sounds. No wheezing.  Abdominal:     General: Bowel sounds are normal. There is no distension.     Palpations: Abdomen is soft.     Tenderness: There is no abdominal tenderness.  Musculoskeletal:     Cervical back: Neck supple.     Right lower leg: Edema present.     Comments: Left BKA  Skin:    General: Skin is warm and dry.     Capillary Refill: Capillary refill takes less than 2 seconds.     Comments: Erythematous rash with scabbing cascading from left upper chest/shoulder to left upper back, skin with overall dry appearance, no sign of infection.   Neurological:     General: No focal deficit present.     Mental Status: He is alert and oriented to person, place, and time.     Motor: Weakness present.     Gait: Gait abnormal.     Comments: Wheelchair, walker with prosthesis/1+ assist, resting hand tremor  Psychiatric:        Mood and Affect: Mood normal.        Behavior: Behavior normal.     Labs reviewed: Recent Labs    06/04/21 0957 06/20/21 1120 07/03/21 0913 07/08/21 0000 07/18/21 0000  NA 138 136 136 139 137  K 4.4 4.6 4.4 4.2 4.5  CL 97 99 102 102 100  CO2 29 27 26  29* 32*  GLUCOSE 83 121* 96  --   --   BUN 35* 32* 34* 35* 34*  CREATININE 1.49* 1.51* 1.49* 1.1 1.1  CALCIUM 8.9 9.1 9.0 8.5* 8.9  MG  --   --  2.2  --   --    Recent Labs    06/20/21 1120 07/03/21 0913 07/08/21 0000  AST 12 14* 12*  ALT 6* 6 3*  ALKPHOS  --  48 45  BILITOT 0.6 0.5  --   PROT 6.9 6.9  --   ALBUMIN  --  3.4* 3.2*   Recent Labs    06/20/21 1120 07/03/21 1618 07/04/21 0128 07/05/21 0302 07/18/21 0000 07/25/21 0000 10/31/21 0000  WBC 7.1 7.4 8.8 6.9 5.5 6.0 6.3  NEUTROABS 5,375 6.6  --   --  3,471.00  --   --   HGB 10.9* 9.5* 7.6* 8.3* 9.8* 10.3* 13.2*  HCT 33.9* 29.6* 24.0* 25.6* 30* 32* 41  MCV 91.1 91.9 92.0 89.8  --   --   --  PLT 234 207 179 198 276 242 217   No results found for: "TSH" Lab Results   Component Value Date   HGBA1C 5.8 (H) 07/03/2021   No results found for: "CHOL", "HDL", "LDLCALC", "LDLDIRECT", "TRIG", "CHOLHDL"  Significant Diagnostic Results in last 30 days:  No results found.  Assessment/Plan 1. Disseminated herpes zoster - rash appears dry, scabs formed - famcyclovir complete 10/13 - denies pain - d/c airborne precautions - administer Shingrix vaccine anytime after Nov 13th  2. S/P below knee amputation, left (HCC) - no phantom pain - 1+ assist/walker with left prosthesis - cont skilled nursing - cont PT  3. Parkinson's disease, unspecified whether dyskinesia present, unspecified whether manifestations fluctuate - followed by neuro - cont sinemet  4. Bilateral leg edema - improved since amputation - non pitting RLE - cont torsemide  5. DVT of deep femoral vein, right (HCC) - Eliquis started 12/2020 - d/c when more ambulatory    Family/ staff Communication: plan discussed with patient and nurse  Labs/tests ordered:  cbc/diff, cmp, A1c 01/16/2022

## 2022-01-16 LAB — HEMOGLOBIN A1C: Hemoglobin A1C: 5.8

## 2022-01-30 ENCOUNTER — Non-Acute Institutional Stay (SKILLED_NURSING_FACILITY): Payer: Medicare Other | Admitting: Internal Medicine

## 2022-01-30 ENCOUNTER — Encounter: Payer: Self-pay | Admitting: Internal Medicine

## 2022-01-30 DIAGNOSIS — G20A1 Parkinson's disease without dyskinesia, without mention of fluctuations: Secondary | ICD-10-CM | POA: Diagnosis not present

## 2022-01-30 DIAGNOSIS — L989 Disorder of the skin and subcutaneous tissue, unspecified: Secondary | ICD-10-CM | POA: Diagnosis not present

## 2022-01-30 DIAGNOSIS — Z89512 Acquired absence of left leg below knee: Secondary | ICD-10-CM

## 2022-01-30 DIAGNOSIS — R600 Localized edema: Secondary | ICD-10-CM | POA: Diagnosis not present

## 2022-01-30 DIAGNOSIS — I82411 Acute embolism and thrombosis of right femoral vein: Secondary | ICD-10-CM

## 2022-01-30 NOTE — Progress Notes (Signed)
[facility] Service[facility]  SNF [phone]) Provider:  [name], MD   [name], MD  Patient Care Team: [name], MD as PCP - General (Internal Medicine) [name], Octaviano Batty, DO as Consulting Physician (Neurology)  Extended Emergency Contact Information Primary Emergency Contact: Lizama,jane Address: [address] [place].          apt [phone] Darden Amber of [place] Home Phone: [phone] Mobile Phone: [phone] Relation: Spouse Secondary Emergency Contact: Sheppard,kenneth Home Phone: [phone] Mobile Phone: [phone] Relation: Son  Code Status:  full code Goals of care: Advanced Directive information    01/30/2022    2:44 PM  Advanced Directives  Does Patient Have a Medical Advance Directive? Yes  Type of Estate agent of [place];Living will  Copy of Healthcare Power of Attorney in Chart? Yes - validated most recent copy scanned in chart (See row information)     Chief Complaint  Patient presents with   Medical Management of Chronic Issues    Routine visit   Immunizations    Discussed the need for covid and shingles vaccine.    HPI:  Pt is a 88 y.o. male seen today for medical management of chronic diseases.    Lives in SNF  Patient is s/p Left BKA for Left Osteomyelitis   Also has h/o  Patient has h/o Parkinson disease Follows with Dr [name] Diagnosed in 2019 in [place]    Also has h/o Osteoporosis , Vit D def Has been on Fosamax before, BPH, Arthritis,    History of right femur fracture followed by acute DVT of right leg.  Continues to stay stable Able to walk with mild assist and his prosthetics but still using [employer] for transfers Small pressure are on his scalp as he sleeps on his head Also wants to see dentist in the facility Otherwise doing well   No new Nursing issues. No Behavior issues His weight is stable Mood is good Recovered from Herpes rash Plans ot get Shingrix No  Falls Wt Readings from Last 3 Encounters:  01/30/22 183 lb 12.8 oz (83.4 kg)  01/15/22 183 lb 12.8 oz (83.4 kg)  01/07/22 181 lb 1.6 oz (82.1 kg)   Past Medical History:  Diagnosis Date   Acute bronchiolitis    Acute osteomyelitis of calcaneum, left (HCC) 01/11/2021   Anemia    Bursitis of both hips    Complication of anesthesia    wife states pt was a little "loopy" for several days   Dementia (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   Hearing reduced    hearing aid   History of basal cell carcinoma    Hyperlipidemia    Malaise and fatigue    MRSA infection 01/11/2021   Osteoarthritis    hand   Osteoporosis    Palpitation    Parkinson's disease    Peripheral vascular disease (HCC)    blood clots   Pre-diabetes    Vitamin D deficiency    Past Surgical History:  Procedure Laterality Date   ABDOMINAL AORTOGRAM W/LOWER EXTREMITY N/A 06/12/2021   Procedure: ABDOMINAL AORTOGRAM W/LOWER EXTREMITY;  Surgeon: Iran Ouch, MD;  [facility]: [facility];  Service: Cardiovascular;  Laterality: N/A;   AMPUTATION Left 07/03/2021   Procedure: LEFT BELOW KNEE AMPUTATION;  Surgeon: Nadara Mustard, MD;  Location: [facility] OR;  Service: Orthopedics;  Laterality: Left;   CATARACT EXTRACTION  05/31/2015   Eye/right  COLONOSCOPY  02/05/2005, 03/06/2005   normal, 10 years ago   MOHS micrographic surgery     Face surgery   ORIF ACETABULAR FRACTURE Right 10/09/2020   Procedure: OPEN REDUCTION INTERNAL FIXATION (ORIF) ACETABULAR FRACTURE;  Surgeon: [name], MD;  Location: [facility] OR;  Service: Orthopedics;  Laterality: Right;    No Known Allergies  Outpatient Encounter Medications as of 01/30/2022  Medication Sig   acetaminophen (TYLENOL) 500 MG tablet Take 1,000 mg by mouth 3 (three) times daily as needed for mild pain or moderate pain.   albuterol (VENTOLIN HFA) 108 (90 Base) MCG/ACT inhaler Inhale 2 puffs into the lungs every 4 (four) hours as needed for wheezing or shortness of breath.   Amino  Acids-Protein Hydrolys (FEEDING SUPPLEMENT, PRO-STAT SUGAR FREE 64,) LIQD Take 30 mLs by mouth in the morning and at bedtime.   calcium carbonate (OSCAL) 1500 (600 Ca) MG TABS tablet Take 600 mg of elemental calcium by mouth daily with breakfast.   carbidopa-levodopa (SINEMET CR) 50-200 MG tablet Take 1 tablet by mouth at bedtime.   carbidopa-levodopa (SINEMET IR) 25-100 MG tablet Take 1 tablet by mouth in the morning, at noon, in the evening, and at bedtime. 06:00 AM, 12:00 PM, 06:00 PM, 12:00 AM   ferrous sulfate 325 (65 FE) MG tablet Take 325 mg by mouth every Monday, Wednesday, and Friday.   polyethylene glycol (MIRALAX / GLYCOLAX) 17 g packet Take 17 g by mouth every other day.   senna (SENOKOT) 8.6 MG tablet Take 1 tablet by mouth daily.   torsemide (DEMADEX) 20 MG tablet Take 20 mg by mouth in the morning.   vitamin B-12 (CYANOCOBALAMIN) 1000 MCG tablet Take 1,000 mcg by mouth daily.   Vitamin D, Cholecalciferol, 25 MCG (1000 UT) TABS Take 1,000 Units by mouth daily.   zinc oxide 20 % ointment Apply 1 application. topically See admin instructions. To buttocks after every incontinent episode and as needed for redness.   [DISCONTINUED] Multiple Vitamins-Minerals (PRESERVISION AREDS 2+MULTI VIT) CAPS Take 1 capsule by mouth in the morning and at bedtime.   No facility-administered encounter medications on file as of 01/30/2022.    Review of Systems  Constitutional:  Negative for activity change, appetite change and unexpected weight change.  HENT: Negative.    Respiratory:  Negative for cough and shortness of breath.   Cardiovascular:  Positive for leg swelling.  Gastrointestinal:  Negative for constipation.  Genitourinary:  Negative for frequency.  Musculoskeletal:  Positive for gait problem. Negative for arthralgias and myalgias.  Skin: Negative.  Negative for rash.  Neurological:  Negative for dizziness and weakness.  Psychiatric/Behavioral:  Negative for confusion and sleep  disturbance.   All other systems reviewed and are negative.   Immunization History  Administered Date(s) Administered   Fluad Quad(high Dose 65+) 01/29/2022   H1N1 06/13/2008   Influenza Split 03/05/2006, 02/26/2007, 01/26/2008, 01/18/2009, 12/12/2009, 01/16/2011, 01/29/2012, 02/01/2013, 02/08/2014, 02/13/2015, 01/10/2016, 02/23/2017, 02/24/2018, 01/24/2019, 12/22/2019   Influenza, High Dose Seasonal PF 01/30/2021   Moderna SARS-COV2 Booster Vaccination 09/18/2021   PFIZER(Purple Top)SARS-COV-2 Vaccination 05/04/2019, 05/25/2019, 09/01/2020   Pfizer Covid-19 Vaccine Bivalent Booster 64yrs & up 12/27/2019, 12/26/2020   Pneumococcal Conjugate-13 02/10/2014   Pneumococcal Polysaccharide-23 06/10/2011   Zoster, Live 07/04/2009   Pertinent  Health Maintenance Due  Topic Date Due   INFLUENZA VACCINE  Completed      07/03/2021    7:16 PM 07/04/2021    9:30 AM 07/04/2021    7:18 PM 07/05/2021    7:15 AM  01/30/2022    2:44 PM  Fall Risk  Falls in the past year?     0  Was there an injury with Fall?     0  Fall Risk Category Calculator     0  Fall Risk Category     Low  Patient Fall Risk Level Moderate fall risk Moderate fall risk Moderate fall risk High fall risk Low fall risk  Patient at Risk for Falls Due to     History of fall(s)  Fall risk Follow up     Falls evaluation completed   Functional Status Survey:    Vitals:   01/30/22 1440  BP: 110/64  Pulse: 82  Resp: 18  Temp: 98.2 F (36.8 C)  TempSrc: Temporal  SpO2: 97%  Weight: 183 lb 12.8 oz (83.4 kg)  Height: 5\' 9"  (1.753 m)   Body mass index is 27.14 kg/m. Physical Exam Vitals reviewed.  Constitutional:      Appearance: Normal appearance.  HENT:     Head: Normocephalic.     Nose: Nose normal.     Mouth/Throat:     Mouth: Mucous membranes are moist.     Pharynx: Oropharynx is clear.  Eyes:     Pupils: Pupils are equal, round, and reactive to light.  Cardiovascular:     Rate and Rhythm: Normal rate and  regular rhythm.     Pulses: Normal pulses.     Heart sounds: Murmur heard.  Pulmonary:     Effort: Pulmonary effort is normal. No respiratory distress.     Breath sounds: Normal breath sounds. No rales.  Abdominal:     General: Abdomen is flat. Bowel sounds are normal.     Palpations: Abdomen is soft.  Musculoskeletal:        General: Swelling present.     Cervical back: Neck supple.  Skin:    General: Skin is warm.  Neurological:     General: No focal deficit present.     Mental Status: He is alert and oriented to person, place, and time.  Psychiatric:        Mood and Affect: Mood normal.        Thought Content: Thought content normal.     Labs reviewed: Recent Labs    06/04/21 0957 06/20/21 1120 07/03/21 0913 07/08/21 0000 07/18/21 0000  NA 138 136 136 139 137  K 4.4 4.6 4.4 4.2 4.5  CL 97 99 102 102 100  CO2 29 27 26  29* 32*  GLUCOSE 83 121* 96  --   --   BUN 35* 32* 34* 35* 34*  CREATININE 1.49* 1.51* 1.49* 1.1 1.1  CALCIUM 8.9 9.1 9.0 8.5* 8.9  MG  --   --  2.2  --   --    Recent Labs    06/20/21 1120 07/03/21 0913 07/08/21 0000  AST 12 14* 12*  ALT 6* 6 3*  ALKPHOS  --  48 45  BILITOT 0.6 0.5  --   PROT 6.9 6.9  --   ALBUMIN  --  3.4* 3.2*   Recent Labs    06/20/21 1120 07/03/21 1618 07/04/21 0128 07/05/21 0302 07/18/21 0000 07/25/21 0000 10/31/21 0000  WBC 7.1 7.4 8.8 6.9 5.5 6.0 6.3  NEUTROABS 5,375 6.6  --   --  3,471.00  --   --   HGB 10.9* 9.5* 7.6* 8.3* 9.8* 10.3* 13.2*  HCT 33.9* 29.6* 24.0* 25.6* 30* 32* 41  MCV 91.1 91.9 92.0 89.8  --   --   --  PLT 234 207 179 198 276 242 217   No results found for: "TSH" Lab Results  Component Value Date   HGBA1C 5.8 (H) 07/03/2021   No results found for: "CHOL", "HDL", "LDLCALC", "LDLDIRECT", "TRIG", "CHOLHDL"  Significant Diagnostic Results in last 30 days:  No results found.  Assessment/Plan 1. S/P below knee amputation, left [facility]) Doing well Accepted the fact that he would not be  able to go back to his apartment with his Wife Does work with therapy  2. Bilateral leg edema Stable on Demadex  3. Parkinson's disease, unspecified whether dyskinesia present, unspecified whether manifestations fluctuate On Sinemet Needs Follow up with Dr [name]  4. Skin lesion of scalp Foam dressing Needs to see Derm  5. History DVT of deep femoral vein, right (HCC) Off Eliquis after 1 year    Family/ staff Communication:   Labs/tests ordered

## 2022-02-24 ENCOUNTER — Non-Acute Institutional Stay (SKILLED_NURSING_FACILITY): Payer: Medicare Other | Admitting: Orthopedic Surgery

## 2022-02-24 ENCOUNTER — Encounter: Payer: Self-pay | Admitting: Orthopedic Surgery

## 2022-02-24 DIAGNOSIS — Z89512 Acquired absence of left leg below knee: Secondary | ICD-10-CM | POA: Diagnosis not present

## 2022-02-24 DIAGNOSIS — D500 Iron deficiency anemia secondary to blood loss (chronic): Secondary | ICD-10-CM

## 2022-02-24 DIAGNOSIS — I82411 Acute embolism and thrombosis of right femoral vein: Secondary | ICD-10-CM

## 2022-02-24 DIAGNOSIS — L989 Disorder of the skin and subcutaneous tissue, unspecified: Secondary | ICD-10-CM

## 2022-02-24 DIAGNOSIS — R600 Localized edema: Secondary | ICD-10-CM

## 2022-02-24 DIAGNOSIS — G20A1 Parkinson's disease without dyskinesia, without mention of fluctuations: Secondary | ICD-10-CM | POA: Diagnosis not present

## 2022-02-24 DIAGNOSIS — N1832 Chronic kidney disease, stage 3b: Secondary | ICD-10-CM

## 2022-02-24 DIAGNOSIS — R7303 Prediabetes: Secondary | ICD-10-CM

## 2022-02-24 DIAGNOSIS — I10 Essential (primary) hypertension: Secondary | ICD-10-CM

## 2022-02-24 NOTE — Progress Notes (Signed)
Location:  [facility] Room Number: 27/A Place of Service:  SNF [phone]) Provider:  Octavia Heir, NP   [name], MD  Patient Care Team: [name], MD as PCP - General (Internal Medicine) [name], Octaviano Batty, DO as Consulting Physician (Neurology)  Extended Emergency Contact Information Primary Emergency Contact: Kubin,jane Address: [address] [place].          apt [phone] [place] Home Phone: [phone] Mobile Phone: [phone] Relation: Spouse Secondary Emergency Contact: Pitones,kenneth Home Phone: [phone] Mobile Phone: [phone] Relation: Son  Code Status:  DNR Goals of care: Advanced Directive information    01/30/2022    2:44 PM  Advanced Directives  Does Patient Have a Medical Advance Directive? Yes  Type of Estate agent of [place];Living will  Copy of Healthcare Power of Attorney in Chart? Yes - validated most recent copy scanned in chart (See row information)     Chief Complaint  Patient presents with   Medical Management of Chronic Issues    HPI:  Pt is a 88 y.o. male seen today for medical management of chronic diseases.    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 Lajoyce Corners due to ongoing osteomyelitis (+MRSA) of left heel, doing well with PT/OT, no phantom pain, ambulates with prosthesis- continues to work with PT, no recent falls Parkinson's-  diagnosed 2019, followed by neurology- scheduled 12/05, recent BIMS 15/15 01/27/2022, remains on sinemet BLE- LV EF 60-65% 02/20/2021, improved since amputation, remains on torsemide  DVT of deep femoral vein- diagnosed 12/2020, remains on Eliquis HTN- BUN/creat 34/1.1 07/18/2021, coreg discontinued due to hypotension CKD 3b- see above Prediabetes- - A1c 5.8 07/03/2021 Anemia- hgb 13.2 10/31/2021, remains on ferrous sulfate M/W/F Skin lesion- lesion to back of head, scheduled to see dermatology 10/05- missed appointment due to Shingles-  requesting to reschedule  Recent blood pressures:  11/14- 121/72  11/07- 108/65  10/31- 112/61  Recent weights:  11/06- 182 lbs  10/02- 183.8 lbs  09/05- 181.1 lbs     Past Surgical History:  Procedure Laterality Date   ABDOMINAL AORTOGRAM W/LOWER EXTREMITY N/A 06/12/2021   Procedure: ABDOMINAL AORTOGRAM W/LOWER EXTREMITY;  Surgeon: Iran Ouch, MD;  Location: [facility] INVASIVE CV LAB;  Service: Cardiovascular;  Laterality: N/A;   AMPUTATION Left 07/03/2021   Procedure: LEFT BELOW KNEE AMPUTATION;  Surgeon: Nadara Mustard, MD;  Location: [facility] OR;  Service: Orthopedics;  Laterality: Left;   CATARACT EXTRACTION  05/31/2015   Eye/right   COLONOSCOPY  02/05/2005, 03/06/2005   normal, 10 years ago   MOHS micrographic surgery     Face surgery   ORIF ACETABULAR FRACTURE Right 10/09/2020   Procedure: OPEN REDUCTION INTERNAL FIXATION (ORIF) ACETABULAR FRACTURE;  Surgeon: [name], MD;  Location: [facility] OR;  Service: Orthopedics;  Laterality: Right;    No Known Allergies  Outpatient Encounter Medications as of 02/24/2022  Medication Sig   acetaminophen (TYLENOL) 500 MG tablet Take 1,000 mg by mouth 3 (three) times daily as needed for mild pain or moderate pain.   albuterol (VENTOLIN HFA) 108 (90 Base) MCG/ACT inhaler Inhale 2 puffs into the lungs every 4 (four) hours as needed for wheezing or shortness of breath.   Amino Acids-Protein Hydrolys (FEEDING SUPPLEMENT, PRO-STAT SUGAR FREE 64,) LIQD Take 30 mLs by mouth in the morning and at bedtime.   calcium carbonate (OSCAL) 1500 (600 Ca) MG TABS tablet Take 600 mg of elemental calcium by mouth daily with breakfast.  carbidopa-levodopa (SINEMET CR) 50-200 MG tablet Take 1 tablet by mouth at bedtime.   carbidopa-levodopa (SINEMET IR) 25-100 MG tablet Take 1 tablet by mouth in the morning, at noon, in the evening, and at bedtime. 06:00 AM, 12:00 PM, 06:00 PM, 12:00 AM   ferrous sulfate 325 (65 FE) MG tablet Take 325 mg by mouth every Monday,  Wednesday, and Friday.   polyethylene glycol (MIRALAX / GLYCOLAX) 17 g packet Take 17 g by mouth every other day.   senna (SENOKOT) 8.6 MG tablet Take 1 tablet by mouth daily.   torsemide (DEMADEX) 20 MG tablet Take 20 mg by mouth in the morning.   vitamin B-12 (CYANOCOBALAMIN) 1000 MCG tablet Take 1,000 mcg by mouth daily.   Vitamin D, Cholecalciferol, 25 MCG (1000 UT) TABS Take 1,000 Units by mouth daily.   zinc oxide 20 % ointment Apply 1 application. topically See admin instructions. To buttocks after every incontinent episode and as needed for redness.   No facility-administered encounter medications on file as of 02/24/2022.    Review of Systems  Constitutional:  Negative for activity change, appetite change, chills, fatigue and fever.  HENT:  Positive for hearing loss. Negative for trouble swallowing.   Eyes:  Negative for visual disturbance.  Respiratory:  Positive for cough. Negative for shortness of breath and wheezing.   Cardiovascular:  Negative for chest pain and leg swelling.  Gastrointestinal:  Negative for abdominal distention, abdominal pain, blood in stool, constipation, diarrhea, nausea and vomiting.  Genitourinary:  Negative for dysuria, frequency and hematuria.  Musculoskeletal:  Positive for gait problem.  Skin:  Negative for wound.  Neurological:  Positive for tremors and weakness. Negative for dizziness and headaches.  Psychiatric/Behavioral:  Negative for confusion, dysphoric mood and sleep disturbance. The patient is not nervous/anxious.     Immunization History  Administered Date(s) Administered   Fluad Quad(high Dose 65+) 01/29/2022   H1N1 06/13/2008   Influenza Split 03/05/2006, 02/26/2007, 01/26/2008, 01/18/2009, 12/12/2009, 01/16/2011, 01/29/2012, 02/01/2013, 02/08/2014, 02/13/2015, 01/10/2016, 02/23/2017, 02/24/2018, 01/24/2019, 12/22/2019   Influenza, High Dose Seasonal PF 01/30/2021   Moderna SARS-COV2 Booster Vaccination 09/18/2021   PFIZER(Purple  Top)SARS-COV-2 Vaccination 05/04/2019, 05/25/2019, 09/01/2020   Pfizer Covid-19 Vaccine Bivalent Booster 4053yrs & up 12/27/2019, 12/26/2020   Pneumococcal Conjugate-13 02/10/2014   Pneumococcal Polysaccharide-23 06/10/2011   Zoster, Live 07/04/2009   Pertinent  Health Maintenance Due  Topic Date Due   INFLUENZA VACCINE  Completed      07/03/2021    7:16 PM 07/04/2021    9:30 AM 07/04/2021    7:18 PM 07/05/2021    7:15 AM 01/30/2022    2:44 PM  Fall Risk  Falls in the past year?     0  Was there an injury with Fall?     0  Fall Risk Category Calculator     0  Fall Risk Category     Low  Patient Fall Risk Level Moderate fall risk Moderate fall risk Moderate fall risk High fall risk Low fall risk  Patient at Risk for Falls Due to     History of fall(s)  Fall risk Follow up     Falls evaluation completed   Functional Status Survey:    Vitals:   02/24/22 1250  BP: 121/72  Pulse: 82  Resp: 18  Temp: 97.6 F (36.4 C)  SpO2: 99%  Weight: 182 lb (82.6 kg)  Height: 5\' 9"  (1.753 m)   Body mass index is 26.88 kg/m. Physical Exam Vitals reviewed.  Constitutional:  General: He is not in acute distress. HENT:     Head: Normocephalic.  Eyes:     General:        Right eye: No discharge.        Left eye: No discharge.  Cardiovascular:     Rate and Rhythm: Normal rate and regular rhythm.     Pulses: Normal pulses.     Heart sounds: Murmur heard.  Pulmonary:     Effort: Pulmonary effort is normal. No respiratory distress.     Breath sounds: Normal breath sounds. No wheezing.  Abdominal:     General: Bowel sounds are normal. There is no distension.     Palpations: Abdomen is soft.     Tenderness: There is no abdominal tenderness.  Musculoskeletal:     Cervical back: Neck supple.     Right lower leg: No edema.     Comments: LBKA  Skin:    General: Skin is warm and dry.     Capillary Refill: Capillary refill takes less than 2 seconds.     Comments: Several irregular  shaped lesions to scalp and outer ears, vary in size, no skin breakdown, drainage or pain  Neurological:     General: No focal deficit present.     Mental Status: He is alert and oriented to person, place, and time.     Motor: Weakness present.     Gait: Gait abnormal.  Psychiatric:        Mood and Affect: Mood normal.        Behavior: Behavior normal.     Labs reviewed: Recent Labs    06/04/21 0957 06/20/21 1120 07/03/21 0913 07/08/21 0000 07/18/21 0000  NA 138 136 136 139 137  K 4.4 4.6 4.4 4.2 4.5  CL 97 99 102 102 100  CO2 29 27 26  29* 32*  GLUCOSE 83 121* 96  --   --   BUN 35* 32* 34* 35* 34*  CREATININE 1.49* 1.51* 1.49* 1.1 1.1  CALCIUM 8.9 9.1 9.0 8.5* 8.9  MG  --   --  2.2  --   --    Recent Labs    06/20/21 1120 07/03/21 0913 07/08/21 0000  AST 12 14* 12*  ALT 6* 6 3*  ALKPHOS  --  48 45  BILITOT 0.6 0.5  --   PROT 6.9 6.9  --   ALBUMIN  --  3.4* 3.2*   Recent Labs    06/20/21 1120 07/03/21 1618 07/04/21 0128 07/05/21 0302 07/18/21 0000 07/25/21 0000 10/31/21 0000  WBC 7.1 7.4 8.8 6.9 5.5 6.0 6.3  NEUTROABS 5,375 6.6  --   --  3,471.00  --   --   HGB 10.9* 9.5* 7.6* 8.3* 9.8* 10.3* 13.2*  HCT 33.9* 29.6* 24.0* 25.6* 30* 32* 41  MCV 91.1 91.9 92.0 89.8  --   --   --   PLT 234 207 179 198 276 242 217   No results found for: "TSH" Lab Results  Component Value Date   HGBA1C 5.8 (H) 07/03/2021   No results found for: "CHOL", "HDL", "LDLCALC", "LDLDIRECT", "TRIG", "CHOLHDL"  Significant Diagnostic Results in last 30 days:  No results found.  Assessment/Plan 1. S/P below knee amputation, left (HCC) - surgery 03/29 by Dr. 4/29 due to ongoing osteomyelitis (+MRSA) of left heel  - doing well with prosthesis - cont skilled nursing - cont PT/OT  2. Parkinson's disease, unspecified whether dyskinesia present, unspecified whether manifestations fluctuate - followed by neurology- scheduled  12/05 - cont sinemet  3. Bilateral leg edema - RLE no  edema - will reduce torsemide to 3x/week  4. History DVT of deep femoral vein, right (HCC) - Eliquis started 12/2020 - plan to discontinue when more ambulatory  5. Primary hypertension - controlled without medication  6. Stage 3b chronic kidney disease (HCC) - avoid nephrotoxic drugs like NSAIDS and dose adjust medications to be renally excreted - encourage hydration with water  - cmp  7. Prediabetes - A1c stable  8. Blood loss anemia - hgb stable - cbc/diff  9. Skin lesion of scalp - missed in house provider due to Shingles - reschedule appointment    Family/ staff Communication: Plan discussed with patient and nurse  Labs/tests ordered:  cbc/diff, cmp

## 2022-02-24 NOTE — Addendum Note (Signed)
Addended byHazle Nordmann E on: 02/24/2022 02:15 PM   Modules accepted: Orders

## 2022-03-03 LAB — COMPREHENSIVE METABOLIC PANEL
Albumin: 3.5 (ref 3.5–5.0)
Calcium: 8.8 (ref 8.7–10.7)
Globulin: 2.6
eGFR: 61

## 2022-03-03 LAB — BASIC METABOLIC PANEL
BUN: 38 — AB (ref 4–21)
CO2: 31 — AB (ref 13–22)
Chloride: 104 (ref 99–108)
Creatinine: 1.2 (ref 0.6–1.3)
Glucose: 81
Potassium: 4.5 mEq/L (ref 3.5–5.1)
Sodium: 140 (ref 137–147)

## 2022-03-03 LAB — CBC AND DIFFERENTIAL
HCT: 38 — AB (ref 41–53)
Hemoglobin: 12.5 — AB (ref 13.5–17.5)
Neutrophils Absolute: 2938
Platelets: 184 10*3/uL (ref 150–400)
WBC: 5.2

## 2022-03-03 LAB — HEPATIC FUNCTION PANEL
ALT: 5 U/L — AB (ref 10–40)
AST: 12 — AB (ref 14–40)
Alkaline Phosphatase: 61 (ref 25–125)
Bilirubin, Total: 0.5

## 2022-03-03 LAB — CBC: RBC: 4.01 (ref 3.87–5.11)

## 2022-03-10 NOTE — Progress Notes (Unsigned)
Assessment/Plan:   1.  Parkinsons Disease -Carbidopa/levodopa 25/100, 1 tab at 8 AM/11 AM/2 PM/5 PM.  [name] that [facility] is getting at correct times as [facility] just state qid dosing. -Continue carbidopa/levodopa 50/200 CR at bedtime   2.  Hx  DVT  -On Eliquis.  Will need to watch gait/falls.  Discussed this with patient, wife, son.  Needs to be with someone at all times if walking.  3.  RBD  -He is using bed rails  -If something else is needed, can try melatonin at bedtime.  4.  Hallucinations  -Resolved  5.  History of left foot osteomyelitis  -Status post left BKA, March 2023  -learning to walk with prosthesis  6.  PBA  -had some of this in the room.  Reassurred him that this is part of Parkinsons Disease.    -no meds needed right now  7.  Wife asks if needs to come back . Would really like to see him at least yearly and they are agreeable to that.  Happy to see more frequent, if able.  Subjective:   Greg [name] was seen today in follow up for Parkinsons disease  My previous records were reviewed prior to todays visit as well as outside records available to me.  Patient is accompanied by wife who supplemented history.  Patient has not been seen in over a year.  Medical records have been reviewed.  Patient developed left foot osteomyelitis and had a left BKA at the end of March.  He got a prosthetic in July and started to walk on it in Aug.  He has run out of PT sessions but states that some volunteers will be walking with him one time per week.  He uses the walker daily with the prosthetic to go to the bathroom but otherwise is in [facility]  He is not having hallucinations.  He is reading to keep mind active.  He enjoys exercise.    Current prescribed movement disorder medications: Carbidopa/levodopa 25/100, 1 tab at 8 AM/11 AM/2 PM/5 PM (records state 06:00 AM, 12:00 PM, 06:00 PM, 12:00 AM) Carbidopa/levodopa 50/200 CR at bedtime     ALLERGIES:  No Known Allergies  CURRENT MEDICATIONS:  Outpatient Encounter Medications as of 03/11/2022  Medication Sig   acetaminophen (TYLENOL) 500 MG tablet Take 1,000 mg by mouth 3 (three) times daily as needed for mild pain or moderate pain.   albuterol (VENTOLIN HFA) 108 (90 Base) MCG/ACT inhaler Inhale 2 puffs into the lungs every 4 (four) hours as needed for wheezing or shortness of breath.   Amino Acids-Protein Hydrolys (FEEDING SUPPLEMENT, PRO-STAT SUGAR FREE 64,) LIQD Take 30 mLs by mouth in the morning and at bedtime.   calcium carbonate (OSCAL) 1500 (600 Ca) MG TABS tablet Take 600 mg of elemental calcium by mouth daily with breakfast.   carbidopa-levodopa (SINEMET CR) 50-200 MG tablet Take 1 tablet by mouth at bedtime.   carbidopa-levodopa (SINEMET IR) 25-100 MG tablet Take 1 tablet by mouth in the morning, at noon, in the evening, and at bedtime. 06:00 AM, 12:00 PM, 06:00 PM, 12:00 AM   ferrous sulfate 325 (65 FE) MG tablet Take 325 mg by mouth every Monday, Wednesday, and Friday.   polyethylene glycol (MIRALAX / GLYCOLAX) 17 g packet Take 17 g by mouth every other day.   senna (SENOKOT) 8.6 MG tablet Take 1 tablet by mouth daily.   Vitamin D, Cholecalciferol,  25 MCG (1000 UT) TABS Take 1,000 Units by mouth daily.   zinc oxide 20 % ointment Apply 1 application. topically See admin instructions. To buttocks after every incontinent episode and as needed for redness.   torsemide (DEMADEX) 20 MG tablet Take 20 mg by mouth 3 (three) times a week. (Patient not taking: Reported on 03/11/2022)   vitamin B-12 (CYANOCOBALAMIN) 1000 MCG tablet Take 1,000 mcg by mouth daily. (Patient not taking: Reported on 03/11/2022)   No facility-administered encounter medications on file as of 03/11/2022.    Objective:   PHYSICAL EXAMINATION:    VITALS:   Vitals:   03/11/22 1303  BP: 112/62  Pulse: 62  SpO2: 96%  Weight: 180 lb 14.4 oz (82.1 kg)  Height: 5\' 10"  (1.778 m)     GEN:  The  patient appears stated age and is in NAD.  Patient intermittently tearful and with pseudobulbar affect. HEENT:  Normocephalic, atraumatic.  The mucous membranes are moist. The superficial temporal arteries are without ropiness or tenderness.   Neurological examination:  Orientation: The patient is alert and oriented x3.   Cranial nerves: There is good facial symmetry with facial hypomimia. The speech is fluent and clear. Soft palate rises symmetrically and there is no tongue deviation. Hearing is decreased to conversational tone. Sensation: Sensation is intact to light touch throughout Motor: Strength is at least antigravity x4.  Movement examination: Tone: There is fairly normal tone today. Abnormal movements: there is no significant tremor today. Coordination:  There is decremation with RAM's, only with hand opening on the L Gait and Station: Not tested as the patient due to not having prosthesis with him and cannot ambulate without it.  I have reviewed and interpreted the following labs independently    Chemistry      Component Value Date/Time   NA 137 07/18/2021 0000   K 4.5 07/18/2021 0000   CL 100 07/18/2021 0000   CO2 32 (A) 07/18/2021 0000   BUN 34 (A) 07/18/2021 0000   CREATININE 1.1 07/18/2021 0000   CREATININE 1.49 (H) 07/03/2021 0913   CREATININE 1.51 (H) 06/20/2021 1120   GLU 82 07/18/2021 0000      Component Value Date/Time   CALCIUM 8.9 07/18/2021 0000   ALKPHOS 45 07/08/2021 0000   AST 12 (A) 07/08/2021 0000   ALT 3 (A) 07/08/2021 0000   BILITOT 0.5 07/03/2021 0913       Lab Results  Component Value Date   WBC 6.3 10/31/2021   HGB 13.2 (A) 10/31/2021   HCT 41 10/31/2021   MCV 89.8 07/05/2021   PLT 217 10/31/2021    No results found for: "TSH"   Total time spent on today's visit was 43 minutes, including both face-to-face time and nonface-to-face time.  Time included that spent on review of records (prior notes available to me/labs/imaging if  pertinent), discussing treatment and goals, answering patient's questions and coordinating care.  Cc:  11/02/2021, MD

## 2022-03-11 ENCOUNTER — Ambulatory Visit (INDEPENDENT_AMBULATORY_CARE_PROVIDER_SITE_OTHER): Payer: Medicare Other | Admitting: Neurology

## 2022-03-11 ENCOUNTER — Encounter: Payer: Self-pay | Admitting: Neurology

## 2022-03-11 VITALS — BP 112/62 | HR 62 | Ht 70.0 in | Wt 180.9 lb

## 2022-03-11 DIAGNOSIS — G20A1 Parkinson's disease without dyskinesia, without mention of fluctuations: Secondary | ICD-10-CM | POA: Diagnosis not present

## 2022-03-11 DIAGNOSIS — F482 Pseudobulbar affect: Secondary | ICD-10-CM | POA: Insufficient documentation

## 2022-04-02 ENCOUNTER — Non-Acute Institutional Stay (SKILLED_NURSING_FACILITY): Payer: Medicare Other | Admitting: Orthopedic Surgery

## 2022-04-02 ENCOUNTER — Encounter: Payer: Self-pay | Admitting: Orthopedic Surgery

## 2022-04-02 DIAGNOSIS — I10 Essential (primary) hypertension: Secondary | ICD-10-CM

## 2022-04-02 DIAGNOSIS — R600 Localized edema: Secondary | ICD-10-CM | POA: Diagnosis not present

## 2022-04-02 DIAGNOSIS — R7303 Prediabetes: Secondary | ICD-10-CM

## 2022-04-02 DIAGNOSIS — L989 Disorder of the skin and subcutaneous tissue, unspecified: Secondary | ICD-10-CM

## 2022-04-02 DIAGNOSIS — D500 Iron deficiency anemia secondary to blood loss (chronic): Secondary | ICD-10-CM

## 2022-04-02 DIAGNOSIS — N1832 Chronic kidney disease, stage 3b: Secondary | ICD-10-CM

## 2022-04-02 DIAGNOSIS — Z89512 Acquired absence of left leg below knee: Secondary | ICD-10-CM | POA: Diagnosis not present

## 2022-04-02 DIAGNOSIS — L02411 Cutaneous abscess of right axilla: Secondary | ICD-10-CM | POA: Diagnosis not present

## 2022-04-02 DIAGNOSIS — G20A1 Parkinson's disease without dyskinesia, without mention of fluctuations: Secondary | ICD-10-CM

## 2022-04-02 DIAGNOSIS — I82411 Acute embolism and thrombosis of right femoral vein: Secondary | ICD-10-CM

## 2022-04-02 NOTE — Progress Notes (Signed)
Lab abstraction

## 2022-04-02 NOTE — Progress Notes (Signed)
Location:  [facility] Room Number: 27 Place of Service:  SNF [phone]) Provider:  Frimy Uffelman Chauncey Cruel, Freddie Breech, MD  Patient Care Team: [name], MD as PCP - General (Internal Medicine) [name], Octaviano Batty, DO as Consulting Physician (Neurology)  Extended Emergency Contact Information Primary Emergency Contact: Hy,jane Address: [address] [place].          apt [phone] Darden Amber of [place] Home Phone: [phone] Mobile Phone: [phone] Relation: Spouse Secondary Emergency Contact: Neukam,kenneth Home Phone: [phone] Mobile Phone: [phone] Relation: Son  Code Status:  Full code Goals of care: Advanced Directive information    04/02/2022   10:17 AM  Advanced Directives  Does Patient Have a Medical Advance Directive? Yes  Type of Advance Directive Living will;Healthcare Power of Attorney  Does patient want to make changes to medical advance directive? No - Patient declined  Copy of Healthcare Power of Attorney in Chart? Yes - validated most recent copy scanned in chart (See row information)     Chief Complaint  Patient presents with   Medical Management of Chronic Issues    Patient is being seen for a routine visit   Immunizations    Discussed the need for a Tetanus and Shingles Vaccine.   Quality Metric Gaps    Discuss the need for Awv Visit     HPI:  Pt is a 88 y.o. male seen today for medical management of chronic diseases.    Skin abscess- noticed this morning, involves right axilla, tender to touch S/p left BKA- surgery 03/29 by Dr. Lajoyce Corners due to ongoing osteomyelitis (+MRSA) of left heel, no phantom pain, PT/OT completed- ambulates with prosthesis to transfer, volunteers to help walk him 1-2x/week ambulates long distances with wheelchair, no recent falls Parkinson's-  diagnosed 2019, followed by Dr. 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 HTN- BUN/creat 38/1.2 03/03/2022, coreg discontinued due to hypotension CKD 3b- see above Prediabetes-  A1c 5.8 01/16/2022 Anemia- hgb 12.5 03/03/2022, remains on ferrous sulfate M/W/F Skin lesion- lesion to back of head, scheduled to see dermatology 10/05- missed appointment due to Shingles- requesting to reschedule- still has not been seen  Recent blood pressures:  12/26- 138/68  12/19- 158/86  12/12- 136/75  Recent weights:  12/03- 180.9 lbs  11/06- 182.0 lbs  10/02- 183.8 lbs  Past Surgical History:  Procedure Laterality Date   ABDOMINAL AORTOGRAM W/LOWER EXTREMITY N/A 06/12/2021   Procedure: ABDOMINAL AORTOGRAM W/LOWER EXTREMITY;  Surgeon: Iran Ouch, MD;  Location: [facility] INVAS[facility];  Service: Cardiovascular;  Laterality: N/A;   AMPUTATION Left 07/03/2021   Procedure: LEFT BELOW KNEE AMPUTATION;  Surgeon: Nadara Mustard, MD;  Location: [facility] OR;  Service: Orthopedics;  Laterality: Left;   CATARACT EXTRACTION  05/31/2015   Eye/right   COLONOSCOPY  02/05/2005, 03/06/2005   normal, 10 years ago   MOHS micrographic surgery     Face surgery   ORIF ACETABULAR FRACTURE Right 10/09/2020   Procedure: OPEN REDUCTION INTERNAL FIXATION (ORIF) ACETABULAR FRACTURE;  Surgeon: [name], MD;  Location: [facility] OR;  Service: Orthopedics;  Laterality: Right;    No Known Allergies  Outpatient Encounter Medications as of 04/02/2022  Medication Sig   acetaminophen (TYLENOL) 500 MG tablet Take 1,000 mg by mouth 3 (three) times daily as needed for mild pain or moderate pain.  albuterol (VENTOLIN HFA) 108 (90 Base) MCG/ACT inhaler Inhale 2 puffs into the lungs every 4 (four) hours as needed for wheezing or shortness of breath.   Amino Acids-Protein Hydrolys (FEEDING SUPPLEMENT, PRO-STAT SUGAR FREE 64,) LIQD Take 30 mLs by mouth in the morning and at bedtime.   calcium carbonate (OSCAL) 1500 (600 Ca) MG TABS tablet Take  600 mg of elemental calcium by mouth daily with breakfast.   carbidopa-levodopa (SINEMET CR) 50-200 MG tablet Take 1 tablet by mouth at bedtime.   carbidopa-levodopa (SINEMET IR) 25-100 MG tablet Take 1 tablet by mouth in the morning, at noon, in the evening, and at bedtime. 06:00 AM, 12:00 PM, 06:00 PM, 12:00 AM   ferrous sulfate 325 (65 FE) MG tablet Take 325 mg by mouth every Monday, Wednesday, and Friday.   Multiple Vitamins-Minerals (PRESERVISION AREDS 2 PO) Take 1 capsule by mouth in the morning and at bedtime.   polyethylene glycol (MIRALAX / GLYCOLAX) 17 g packet Take 17 g by mouth every other day.   senna (SENOKOT) 8.6 MG tablet Take 1 tablet by mouth daily.   torsemide (DEMADEX) 20 MG tablet Take 20 mg by mouth 3 (three) times a week.   vitamin B-12 (CYANOCOBALAMIN) 1000 MCG tablet Take 1,000 mcg by mouth daily.   Vitamin D, Cholecalciferol, 25 MCG (1000 UT) TABS Take 1,000 Units by mouth daily.   zinc oxide 20 % ointment Apply 1 application. topically See admin instructions. To buttocks after every incontinent episode and as needed for redness.   No facility-administered encounter medications on file as of 04/02/2022.    Review of Systems  Constitutional:  Negative for activity change, appetite change, fatigue and fever.  HENT:  Negative for congestion and trouble swallowing.   Eyes:  Negative for visual disturbance.  Respiratory:  Negative for cough, shortness of breath and wheezing.   Cardiovascular:  Negative for chest pain and leg swelling.  Gastrointestinal:  Negative for abdominal distention, abdominal pain, constipation, diarrhea, nausea and vomiting.  Genitourinary:  Negative for dysuria, frequency and hematuria.  Musculoskeletal:  Positive for gait problem.  Skin:  Positive for wound.  Neurological:  Positive for tremors and weakness. Negative for dizziness and headaches.  Psychiatric/Behavioral:  Negative for confusion, dysphoric mood and sleep disturbance. The patient  is not nervous/anxious.     Immunization History  Administered Date(s) Administered   Fluad Quad(high Dose 65+) 01/29/2022   H1N1 06/13/2008   Influenza Split 03/05/2006, 02/26/2007, 01/26/2008, 01/18/2009, 12/12/2009, 01/16/2011, 01/29/2012, 02/01/2013, 02/08/2014, 02/13/2015, 01/10/2016, 02/23/2017, 02/24/2018, 01/24/2019, 12/22/2019   Influenza, High Dose Seasonal PF 01/30/2021   Moderna SARS-COV2 Booster Vaccination 09/18/2021   PFIZER(Purple Top)SARS-COV-2 Vaccination 05/04/2019, 05/25/2019, 09/01/2020, 02/11/2022   Pfizer Covid-19 Vaccine Bivalent Booster 49yrs & up 12/27/2019, 12/26/2020   Pneumococcal Conjugate-13 02/10/2014   Pneumococcal Polysaccharide-23 06/10/2011   Zoster, Live 07/04/2009   Pertinent  Health Maintenance Due  Topic Date Due   INFLUENZA VACCINE  Completed      07/04/2021    9:30 AM 07/04/2021    7:18 PM 07/05/2021    7:15 AM 01/30/2022    2:44 PM 03/11/2022    1:03 PM  Fall Risk  Falls in the past year?    0 0  Was there an injury with Fall?    0 0  Fall Risk Category Calculator    0 0  Fall Risk Category    Low Low  Patient Fall Risk Level Moderate fall risk Moderate fall risk High fall risk Low fall risk Low  fall risk  Patient at Risk for Falls Due to    History of fall(s)   Fall risk Follow up    Falls evaluation completed Falls evaluation completed   Functional Status Survey:    Vitals:   04/02/22 1011  BP: 138/68  Pulse: 77  Resp: 20  Temp: (!) 97.2 F (36.2 C)  TempSrc: Temporal  SpO2: 96%  Weight: 180 lb 14.4 oz (82.1 kg)  Height: 5\' 10"  (1.778 m)   Body mass index is 25.96 kg/m. Physical Exam Vitals reviewed.  Constitutional:      General: He is not in acute distress. HENT:     Head: Normocephalic.  Eyes:     General:        Right eye: No discharge.        Left eye: No discharge.  Cardiovascular:     Rate and Rhythm: Normal rate and regular rhythm.     Pulses: Normal pulses.     Heart sounds: Murmur heard.  Pulmonary:      Effort: Pulmonary effort is normal. No respiratory distress.     Breath sounds: Normal breath sounds. No wheezing.  Abdominal:     General: Bowel sounds are normal. There is no distension.     Palpations: Abdomen is soft.     Tenderness: There is no abdominal tenderness.  Musculoskeletal:     Cervical back: Neck supple.     Right lower leg: No edema.     Comments: Left BKA  Skin:    General: Skin is warm.     Capillary Refill: Capillary refill takes less than 2 seconds.     Findings: Lesion present.     Comments: Approx < 1 cm tender lesion under right axilla, center purulent/white appearance, surrounding skin intact  Neurological:     General: No focal deficit present.     Mental Status: He is alert and oriented to person, place, and time.     Motor: Weakness present.     Gait: Gait abnormal.     Comments: wheelchair  Psychiatric:        Mood and Affect: Mood normal.        Behavior: Behavior normal.     Labs reviewed: Recent Labs    06/04/21 0957 06/04/21 0957 06/20/21 1120 07/03/21 0913 07/08/21 0000 07/18/21 0000  NA 138   < > 136 136 139 137  K 4.4  --  4.6 4.4 4.2 4.5  CL 97  --  99 102 102 100  CO2 29  --  27 26 29* 32*  GLUCOSE 83  --  121* 96  --   --   BUN 35*   < > 32* 34* 35* 34*  CREATININE 1.49*  --  1.51* 1.49* 1.1 1.1  CALCIUM 8.9  --  9.1 9.0 8.5* 8.9  MG  --   --   --  2.2  --   --    < > = values in this interval not displayed.   Recent Labs    06/20/21 1120 07/03/21 0913 07/08/21 0000  AST 12 14* 12*  ALT 6* 6 3*  ALKPHOS  --  48 45  BILITOT 0.6 0.5  --   PROT 6.9 6.9  --   ALBUMIN  --  3.4* 3.2*   Recent Labs    06/20/21 1120 07/03/21 1618 07/04/21 0128 07/05/21 0302 07/18/21 0000 07/25/21 0000 10/31/21 0000  WBC 7.1 7.4 8.8 6.9 5.5 6.0 6.3  NEUTROABS 5,375 6.6  --   --  3,471.00  --   --   HGB 10.9* 9.5* 7.6* 8.3* 9.8* 10.3* 13.2*  HCT 33.9* 29.6* 24.0* 25.6* 30* 32* 41  MCV 91.1 91.9 92.0 89.8  --   --   --   PLT 234  207 179 198 276 242 217   No results found for: "TSH" Lab Results  Component Value Date   HGBA1C 5.8 (H) 07/03/2021   No results found for: "CHOL", "HDL", "LDLCALC", "LDLDIRECT", "TRIG", "CHOLHDL"  Significant Diagnostic Results in last 30 days:  No results found.  Assessment/Plan 1. Cutaneous abscess of right axilla - noticed 12/27 - Approx 1 cm tender lesion with purulent/white center - too small for I/D, aspirated with sterile needle> tolerated well - warm compresses BID x 5 days - start doxycycline 100 mg po BID x 7 days  2. S/P below knee amputation, left (HCC) - - surgery 03/29 by Dr. Lajoyce Corners due to ongoing osteomyelitis (+MRSA) of left heel  - doing well with prosthesis - PT/OT completed> ambuating with volunteers 1-2x/week - cont SNF  3. Parkinson's disease, unspecified whether dyskinesia present, unspecified whether manifestations fluctuate - followed by Dr. Arbutus Leas - cont sinemet  4. Bilateral leg edema - cont torsemide 3x/week  5. History DVT of deep femoral vein, right (HCC) - Eliquis started 12/2020 - recommend continuous use due to limited ambulation  6. Primary hypertension - controlled without medication  7. Stage 3b chronic kidney disease (HCC) - avoid nephrotoxic drugs like NSAIDS and dose adjust medications to be renally excreted - encourage hydration with water   8. Prediabetes - A1c 5.8 - cont dietary precautions  9. Blood loss anemia - hgb stable  10. Skin lesion of scalp - waiting to see dermatology    Family/ staff Communication: plan discussed with patient and nurse  Labs/tests ordered:  none

## 2022-04-18 ENCOUNTER — Encounter: Payer: Self-pay | Admitting: Orthopedic Surgery

## 2022-04-18 ENCOUNTER — Non-Acute Institutional Stay (SKILLED_NURSING_FACILITY): Payer: Medicare Other | Admitting: Orthopedic Surgery

## 2022-04-18 DIAGNOSIS — L02411 Cutaneous abscess of right axilla: Secondary | ICD-10-CM | POA: Diagnosis not present

## 2022-04-18 DIAGNOSIS — Z89512 Acquired absence of left leg below knee: Secondary | ICD-10-CM

## 2022-04-18 NOTE — Progress Notes (Signed)
Location:  [place] Room Number: 27 Place of Service:  SNF [phone]) Provider:  Windell Moulding, NP  Patient Care Team: Virgie Dad, MD as PCP - General (Internal Medicine) [name], Eustace Quail, DO as Consulting Physician (Neurology)  Extended Emergency Contact Information Primary Emergency Contact: Fauth,jane Address: [place].          apt [phone] Johnnette Litter of [place] Phone: [phone] Mobile Phone: [phone] Relation: Spouse Secondary Emergency Contact: Aman,kenneth Home Phone: [phone] Mobile Phone: [phone] Relation: Son  Code Status:  Full Code Goals of care: Advanced Directive information    04/18/2022    9:21 AM  Advanced Directives  Does Patient Have a Medical Advance Directive? Yes  Type of Paramedic of [place];Living will  Does patient want to make changes to medical advance directive? No - Patient declined  Copy of [place] in Chart? Yes - validated most recent copy scanned in chart (See row information)     Chief Complaint  Patient presents with   Acute Visit    Skin Abscess    HPI:  Pt is a 89 y.o. male seen today for acute visit due to right underarm skin abscess.   12/27 diagnosed with right axilla skin abscess. He was prescribed doxycycline x 7 days and abscess resolved. Today, caregiver reports returning abscess to right axilla. Area tender to touch, no skin breakdown. Afebrile. Vitals stable. Scheduled to see dermatology 05/08/2022.   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 Sharol Given due to ongoing osteomyelitis (+MRSA) of left heel, no phantom pain, PT/OT completed, volunteers help walk him 1-2x/week, ambulates long distances with wheelchair, no recent falls   Past Medical History:  Diagnosis Date   Acute bronchiolitis    Acute osteomyelitis of calcaneum, left ([place]) 01/11/2021   Anemia    Bursitis of both hips    Complication of anesthesia    wife states  pt was a little "loopy" for several days   Dementia ([place])    due to Parkinson's   Hearing reduced    hearing aid   History of basal cell carcinoma    Hyperlipidemia    Malaise and fatigue    MRSA infection 01/11/2021   Osteoarthritis    hand   Osteoporosis    Palpitation    Parkinson's disease    Peripheral vascular disease ([place])    blood clots   Pre-diabetes    Vitamin D deficiency    Past Surgical History:  Procedure Laterality Date   ABDOMINAL AORTOGRAM W/LOWER EXTREMITY N/A 06/12/2021   Procedure: ABDOMINAL AORTOGRAM W/LOWER EXTREMITY;  Surgeon: Wellington Hampshire, MD;  Location: [facility];  Service: Cardiovascular;  Laterality: N/A;   AMPUTATION Left 07/03/2021   Procedure: LEFT BELOW KNEE AMPUTATION;  Surgeon: Newt Minion, MD;  Location: [place];  Service: Orthopedics;  Laterality: Left;   CATARACT EXTRACTION  05/31/2015   Eye/right   COLONOSCOPY  02/05/2005, 03/06/2005   normal, 10 years ago   MOHS micrographic surgery     Face surgery   ORIF ACETABULAR FRACTURE Right 10/09/2020   Procedure: OPEN REDUCTION INTERNAL FIXATION (ORIF) ACETABULAR FRACTURE;  Surgeon: Altamese Wyandot, MD;  Location: [place];  Service: Orthopedics;  Laterality: Right;    No Known Allergies  Outpatient Encounter Medications as of 04/18/2022  Medication Sig   acetaminophen (TYLENOL) 500 MG tablet Take 1,000 mg by mouth 3 (three) times daily as needed for mild pain or moderate  pain.   albuterol (VENTOLIN HFA) 108 (90 Base) MCG/ACT inhaler Inhale 2 puffs into the lungs every 4 (four) hours as needed for wheezing or shortness of breath.   Amino Acids-Protein Hydrolys (FEEDING SUPPLEMENT, PRO-STAT SUGAR FREE 64,) LIQD Take 30 mLs by mouth in the morning and at bedtime.   calcium carbonate (OSCAL) 1500 (600 Ca) MG TABS tablet Take 600 mg of elemental calcium by mouth daily with breakfast.   carbidopa-levodopa (SINEMET CR) 50-200 MG tablet Take 1 tablet by mouth at bedtime.   carbidopa-levodopa  (SINEMET IR) 25-100 MG tablet Take 1 tablet by mouth in the morning, at noon, in the evening, and at bedtime. 06:00 AM, 12:00 PM, 06:00 PM, 12:00 AM   ferrous sulfate 325 (65 FE) MG tablet Take 325 mg by mouth every Monday, Wednesday, and Friday.   Multiple Vitamins-Minerals (PRESERVISION AREDS 2 PO) Take 1 capsule by mouth in the morning and at bedtime.   polyethylene glycol (MIRALAX / GLYCOLAX) 17 g packet Take 17 g by mouth every other day.   senna (SENOKOT) 8.6 MG tablet Take 1 tablet by mouth daily.   torsemide (DEMADEX) 20 MG tablet Take 20 mg by mouth 3 (three) times a week.   vitamin B-12 (CYANOCOBALAMIN) 1000 MCG tablet Take 1,000 mcg by mouth daily.   Vitamin D, Cholecalciferol, 25 MCG (1000 UT) TABS Take 1,000 Units by mouth daily.   zinc oxide 20 % ointment Apply 1 application. topically See admin instructions. To buttocks after every incontinent episode and as needed for redness.   No facility-administered encounter medications on file as of 04/18/2022.    Review of Systems  Constitutional:  Negative for activity change, appetite change, fatigue and fever.  Respiratory:  Negative for cough, shortness of breath and wheezing.   Cardiovascular:  Negative for chest pain and leg swelling.  Musculoskeletal:  Positive for gait problem.  Skin:  Positive for wound.  Psychiatric/Behavioral:  Negative for confusion and dysphoric mood. The patient is not nervous/anxious.     Immunization History  Administered Date(s) Administered   Fluad Quad(high Dose 65+) 01/29/2022   H1N1 06/13/2008   Influenza Split 03/05/2006, 02/26/2007, 01/26/2008, 01/18/2009, 12/12/2009, 01/16/2011, 01/29/2012, 02/01/2013, 02/08/2014, 02/13/2015, 01/10/2016, 02/23/2017, 02/24/2018, 01/24/2019, 12/22/2019   Influenza, High Dose Seasonal PF 01/30/2021   Moderna SARS-COV2 Booster Vaccination 09/18/2021   PFIZER(Purple Top)SARS-COV-2 Vaccination 05/04/2019, 05/25/2019, 09/01/2020, 02/11/2022   Pfizer Covid-19  Vaccine Bivalent Booster 68yrs & up 12/27/2019, 12/26/2020   Pneumococcal Conjugate-13 02/10/2014   Pneumococcal Polysaccharide-23 06/10/2011   Zoster, Live 07/04/2009   Pertinent  Health Maintenance Due  Topic Date Due   INFLUENZA VACCINE  Completed      07/04/2021    7:18 PM 07/05/2021    7:15 AM 01/30/2022    2:44 PM 03/11/2022    1:03 PM 04/18/2022    9:20 AM  Fall Risk  Falls in the past year?   0 0 0  Was there an injury with Fall?   0 0 0  Fall Risk Category Calculator   0 0 0  Fall Risk Category   Low Low Low  Patient Fall Risk Level Moderate fall risk High fall risk Low fall risk Low fall risk Low fall risk  Patient at Risk for Falls Due to   History of fall(s)  No Fall Risks  Fall risk Follow up   Falls evaluation completed Falls evaluation completed Falls evaluation completed   Functional Status Survey:    Vitals:   04/18/22 0917  BP: 119/61  Pulse: 78  Resp: 20  Temp: (!) 97.2 F (36.2 C)  SpO2: 94%  Weight: 180 lb 9 oz (81.9 kg)  Height: 5\' 10"  (1.778 m)   Body mass index is 25.91 kg/m. Physical Exam Vitals reviewed.  Constitutional:      General: He is not in acute distress. HENT:     Head: Normocephalic.  Eyes:     General:        Right eye: No discharge.        Left eye: No discharge.  Cardiovascular:     Rate and Rhythm: Normal rate and regular rhythm.     Pulses: Normal pulses.     Heart sounds: Murmur heard.  Pulmonary:     Effort: Pulmonary effort is normal. No respiratory distress.     Breath sounds: Normal breath sounds. No wheezing.  Abdominal:     General: Bowel sounds are normal. There is no distension.     Palpations: Abdomen is soft.     Tenderness: There is no abdominal tenderness.  Musculoskeletal:     Cervical back: Neck supple.     Right lower leg: No edema.     Comments: Left BKA  Skin:    General: Skin is warm and dry.     Capillary Refill: Capillary refill takes less than 2 seconds.     Findings: Lesion present.      Comments: Approx < 0.5 cm tender, firm nodule to right axilla, erythema present, no skin breakdown or drainage.   Neurological:     General: No focal deficit present.     Mental Status: He is alert and oriented to person, place, and time.     Motor: Weakness present.     Gait: Gait abnormal.  Psychiatric:        Mood and Affect: Mood normal.        Behavior: Behavior normal.     Labs reviewed: Recent Labs    06/04/21 0957 06/04/21 0957 06/20/21 1120 07/03/21 0913 07/08/21 0000 07/18/21 0000 03/03/22 0000  NA 138   < > 136 136 139 137 140  K 4.4  --  4.6 4.4 4.2 4.5 4.5  CL 97  --  99 102 102 100 104  CO2 29  --  27 26 29* 32* 31*  GLUCOSE 83  --  121* 96  --   --   --   BUN 35*   < > 32* 34* 35* 34* 38*  CREATININE 1.49*  --  1.51* 1.49* 1.1 1.1 1.2  CALCIUM 8.9  --  9.1 9.0 8.5* 8.9 8.8  MG  --   --   --  2.2  --   --   --    < > = values in this interval not displayed.   Recent Labs    06/20/21 1120 07/03/21 0913 07/08/21 0000 03/03/22 0000  AST 12 14* 12* 12*  ALT 6* 6 3* 5*  ALKPHOS  --  48 45 61  BILITOT 0.6 0.5  --   --   PROT 6.9 6.9  --   --   ALBUMIN  --  3.4* 3.2* 3.5   Recent Labs    07/03/21 1618 07/04/21 0128 07/05/21 0302 07/18/21 0000 07/25/21 0000 10/31/21 0000 03/03/22 0000  WBC 7.4 8.8 6.9 5.5 6.0 6.3 5.2  NEUTROABS 6.6  --   --  3,471.00  --   --  2,938.00  HGB 9.5* 7.6* 8.3* 9.8* 10.3* 13.2* 12.5*  HCT 29.6* 24.0*  25.6* 30* 32* 41 38*  MCV 91.9 92.0 89.8  --   --   --   --   PLT 207 179 198 276 242 217 184   No results found for: "TSH" Lab Results  Component Value Date   HGBA1C 5.8 01/16/2022   No results found for: "CHOL", "HDL", "LDLCALC", "LDLDIRECT", "TRIG", "CHOLHDL"  Significant Diagnostic Results in last 30 days:  No results found.  Assessment/Plan 1. Cutaneous abscess of right axilla - 12/27 first diagnosed> prescribed doxy x 7 days> resolved  - abscess has reappeared today, 1/2 size - ? hidradenitis  suppurativa - will extend doxy x 14 days - start warm compress BID x 10 days - was underarms with Dial antibacterial soap - in house dermatology consult 05/08/2022  2. S/P below knee amputation, left (HCC) - surgery 03/29 by Dr. Lajoyce Corners due to ongoing osteomyelitis (+MRSA) of left heel   - doing well with prosthetic - completed PT/OT for now - ambulating 1-2 x/week with volunteers - ambulates long distances with wheelchair    Family/ staff Communication: plan discussed with patient and nurse  Labs/tests ordered:  02/01 dermatology consult

## 2022-05-01 ENCOUNTER — Non-Acute Institutional Stay (SKILLED_NURSING_FACILITY): Payer: Medicare Other | Admitting: Internal Medicine

## 2022-05-01 ENCOUNTER — Encounter: Payer: Self-pay | Admitting: Internal Medicine

## 2022-05-01 DIAGNOSIS — Z89512 Acquired absence of left leg below knee: Secondary | ICD-10-CM

## 2022-05-01 DIAGNOSIS — N1831 Chronic kidney disease, stage 3a: Secondary | ICD-10-CM | POA: Diagnosis not present

## 2022-05-01 DIAGNOSIS — G20A1 Parkinson's disease without dyskinesia, without mention of fluctuations: Secondary | ICD-10-CM

## 2022-05-01 DIAGNOSIS — R600 Localized edema: Secondary | ICD-10-CM

## 2022-05-01 NOTE — Progress Notes (Signed)
Location:  [facility] Room Number: 27-A Place of Service:  SNF [phone]) Provider:  [name], MD   [name], MD  Patient Care Team: [name], MD as PCP - General (Internal Medicine) [name], Octaviano Batty, DO as Consulting Physician (Neurology)  Extended Emergency Contact Information Primary Emergency Contact: Hockley,jane Address: [address] [place].          apt [phone] Darden Amber of [place] Home Phone: [phone] Mobile Phone: [phone] Relation: Spouse Secondary Emergency Contact: Kovacevic,kenneth Home Phone: [phone] Mobile Phone: [phone] Relation: Son  Code Status:  full code Goals of care: Advanced Directive information    05/01/2022   11:51 AM  Advanced Directives  Does Patient Have a Medical Advance Directive? Yes  Type of Estate agent of [place];Living will  Copy of Healthcare Power of Attorney in Chart? Yes - validated most recent copy scanned in chart (See row information)     Chief Complaint  Patient presents with   Medical Management of Chronic Issues    Routine visit   Immunizations    Discussed the need for Tdap and shingles    HPI:  Pt is a 89 y.o. male seen today for medical management of chronic diseases.    Lives in SNF   Patient is s/p Left BKA for Left Osteomyelitis    h/o Parkinson disease Follows with Dr [name] Diagnosed in 2019 in [place]   h/o Osteoporosis , Vit D def Has been on Fosamax before, BPH, Arthritis,   right femur fracture followed by acute DVT of right leg.  Continues to stay stable Able to walk with mild assist and his prosthetics but still using Michiel Sites for transfers Small pressure ulcer on his scalp as he sleeps on his head  Going to see Dermatology   No new Nursing issues. No Behavior issues His weight is stable No Falls Wt Readings from Last 3 Encounters:  05/01/22 180 lb 14.4 oz (82.1 kg)  04/18/22 180 lb 9 oz (81.9 kg)  04/02/22 180 lb  14.4 oz (82.1 kg)     Past Medical History:  Diagnosis Date   Acute bronchiolitis    Acute osteomyelitis of calcaneum, left (HCC) 01/11/2021   Anemia    Bursitis of both hips    Complication of anesthesia    wife states pt was a little "loopy" for several days   Dementia (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   Hearing reduced    hearing aid   History of basal cell carcinoma    Hyperlipidemia    Malaise and fatigue    MRSA infection 01/11/2021   Osteoarthritis    hand   Osteoporosis    Palpitation    Parkinson's disease    Peripheral vascular disease (HCC)    blood clots   Pre-diabetes    Vitamin D deficiency    Past Surgical History:  Procedure Laterality Date   ABDOMINAL AORTOGRAM W/LOWER EXTREMITY N/A 06/12/2021   Procedure: ABDOMINAL AORTOGRAM W/LOWER EXTREMITY;  Surgeon: Iran Ouch, MD;  Location: [facility];  Service: Cardiovascular;  Laterality: N/A;   AMPUTATION Left 07/03/2021   Procedure: LEFT BELOW KNEE AMPUTATION;  Surgeon: Nadara Mustard, MD;  Location: [facility] OR;  Service: Orthopedics;  Laterality: Left;   CATARACT EXTRACTION  05/31/2015   Eye/right   COLONOSCOPY  02/05/2005, 03/06/2005   normal, 10 years ago   MOHS micrographic surgery     Face surgery   ORIF  ACETABULAR FRACTURE Right 10/09/2020   Procedure: OPEN REDUCTION INTERNAL FIXATION (ORIF) ACETABULAR FRACTURE;  Surgeon: Altamese [place], MD;  Location: [facility];  Service: Orthopedics;  Laterality: Right;    No Known Allergies  Outpatient Encounter Medications as of 05/01/2022  Medication Sig   acetaminophen (TYLENOL) 500 MG tablet Take 1,000 mg by mouth 3 (three) times daily as needed for mild pain or moderate pain.   albuterol (VENTOLIN HFA) 108 (90 Base) MCG/ACT inhaler Inhale 2 puffs into the lungs every 4 (four) hours as needed for wheezing or shortness of breath.   Amino Acids-Protein Hydrolys (FEEDING SUPPLEMENT, PRO-STAT SUGAR FREE 64,) LIQD Take 30 mLs by mouth in the morning and at bedtime.    calcium carbonate (OSCAL) 1500 (600 Ca) MG TABS tablet Take 600 mg of elemental calcium by mouth daily with breakfast.   carbidopa-levodopa (SINEMET CR) 50-200 MG tablet Take 1 tablet by mouth at bedtime.   carbidopa-levodopa (SINEMET IR) 25-100 MG tablet Take 1 tablet by mouth in the morning, at noon, in the evening, and at bedtime. 06:00 AM, 12:00 PM, 06:00 PM, 12:00 AM   doxycycline (DORYX) 100 MG EC tablet Take 100 mg by mouth 2 (two) times daily.   ferrous sulfate 325 (65 FE) MG tablet Take 325 mg by mouth every Monday, Wednesday, and Friday.   Multiple Vitamins-Minerals (PRESERVISION AREDS 2 PO) Take 1 capsule by mouth in the morning and at bedtime.   polyethylene glycol (MIRALAX / GLYCOLAX) 17 g packet Take 17 g by mouth every other day.   senna (SENOKOT) 8.6 MG tablet Take 1 tablet by mouth daily.   torsemide (DEMADEX) 20 MG tablet Take 20 mg by mouth 3 (three) times a week.   vitamin B-12 (CYANOCOBALAMIN) 1000 MCG tablet Take 1,000 mcg by mouth daily.   Vitamin D, Cholecalciferol, 25 MCG (1000 UT) TABS Take 1,000 Units by mouth daily.   zinc oxide 20 % ointment Apply 1 application. topically See admin instructions. To buttocks after every incontinent episode and as needed for redness.   No facility-administered encounter medications on file as of 05/01/2022.    Review of Systems  Constitutional:  Negative for activity change, appetite change and unexpected weight change.  HENT: Negative.    Respiratory:  Negative for cough and shortness of breath.   Cardiovascular:  Negative for leg swelling.  Gastrointestinal:  Negative for constipation.  Genitourinary:  Negative for frequency.  Musculoskeletal:  Positive for gait problem. Negative for arthralgias and myalgias.  Skin:  Positive for wound. Negative for rash.  Neurological:  Negative for dizziness and weakness.  Psychiatric/Behavioral:  Negative for confusion and sleep disturbance.   All other systems reviewed and are  negative.   Immunization History  Administered Date(s) Administered   Fluad Quad(high Dose 65+) 01/29/2022   H1N1 06/13/2008   Influenza Split 03/05/2006, 02/26/2007, 01/26/2008, 01/18/2009, 12/12/2009, 01/16/2011, 01/29/2012, 02/01/2013, 02/08/2014, 02/13/2015, 01/10/2016, 02/23/2017, 02/24/2018, 01/24/2019, 12/22/2019   Influenza, High Dose Seasonal PF 01/30/2021   Moderna SARS-COV2 Booster Vaccination 09/18/2021   PFIZER(Purple Top)SARS-COV-2 Vaccination 05/04/2019, 05/25/2019, 09/01/2020, 02/11/2022   Pfizer Covid-19 Vaccine Bivalent Booster 65yrs & up 12/27/2019, 12/26/2020   Pneumococcal Conjugate-13 02/10/2014   Pneumococcal Polysaccharide-23 06/10/2011   Zoster, Live 07/04/2009   Pertinent  Health Maintenance Due  Topic Date Due   INFLUENZA VACCINE  Completed      07/04/2021    7:18 PM 07/05/2021    7:15 AM 01/30/2022    2:44 PM 03/11/2022    1:03 PM 04/18/2022  9:20 AM  Fall Risk  Falls in the past year?   0 0 0  Was there an injury with Fall?   0 0 0  Fall Risk Category Calculator   0 0 0  Fall Risk Category (Retired)   Low Low Low  (RETIRED) Patient Fall Risk Level Moderate fall risk High fall risk Low fall risk Low fall risk Low fall risk  Patient at Risk for Falls Due to   History of fall(s)  No Fall Risks  Fall risk Follow up   Falls evaluation completed Falls evaluation completed Falls evaluation completed   Functional Status Survey:    Vitals:   05/01/22 1043  BP: (!) 119/56  Pulse: 64  Resp: 17  Temp: (!) 97.1 F (36.2 C)  TempSrc: Temporal  SpO2: 97%  Weight: 180 lb 14.4 oz (82.1 kg)  Height: 5\' 10"  (1.778 m)   Body mass index is 25.96 kg/m. Physical Exam Vitals reviewed.  Constitutional:      Appearance: Normal appearance.  HENT:     Head: Normocephalic.     Nose: Nose normal.     Mouth/Throat:     Mouth: Mucous membranes are moist.     Pharynx: Oropharynx is clear.  Eyes:     Pupils: Pupils are equal, round, and reactive to light.   Cardiovascular:     Rate and Rhythm: Normal rate and regular rhythm.     Pulses: Normal pulses.     Heart sounds: No murmur heard. Pulmonary:     Effort: Pulmonary effort is normal. No respiratory distress.     Breath sounds: Normal breath sounds. No rales.  Abdominal:     General: Abdomen is flat. Bowel sounds are normal.     Palpations: Abdomen is soft.  Musculoskeletal:     Cervical back: Neck supple.     Comments: Mild swelling in his Right leg  Skin:    General: Skin is warm.  Neurological:     General: No focal deficit present.     Mental Status: He is alert and oriented to person, place, and time.  Psychiatric:        Mood and Affect: Mood normal.        Thought Content: Thought content normal.     Labs reviewed: Recent Labs    06/04/21 0957 06/04/21 0957 06/20/21 1120 07/03/21 0913 07/08/21 0000 07/18/21 0000 03/03/22 0000  NA 138   < > 136 136 139 137 140  K 4.4  --  4.6 4.4 4.2 4.5 4.5  CL 97  --  99 102 102 100 104  CO2 29  --  27 26 29* 32* 31*  GLUCOSE 83  --  121* 96  --   --   --   BUN 35*   < > 32* 34* 35* 34* 38*  CREATININE 1.49*  --  1.51* 1.49* 1.1 1.1 1.2  CALCIUM 8.9  --  9.1 9.0 8.5* 8.9 8.8  MG  --   --   --  2.2  --   --   --    < > = values in this interval not displayed.   Recent Labs    06/20/21 1120 07/03/21 0913 07/08/21 0000 03/03/22 0000  AST 12 14* 12* 12*  ALT 6* 6 3* 5*  ALKPHOS  --  48 45 61  BILITOT 0.6 0.5  --   --   PROT 6.9 6.9  --   --   ALBUMIN  --  3.4*  3.2* 3.5   Recent Labs    07/03/21 1618 07/04/21 0128 07/05/21 0302 07/18/21 0000 07/25/21 0000 10/31/21 0000 03/03/22 0000  WBC 7.4 8.8 6.9 5.5 6.0 6.3 5.2  NEUTROABS 6.6  --   --  3,471.00  --   --  2,938.00  HGB 9.5* 7.6* 8.3* 9.8* 10.3* 13.2* 12.5*  HCT 29.6* 24.0* 25.6* 30* 32* 41 38*  MCV 91.9 92.0 89.8  --   --   --   --   PLT 207 179 198 276 242 217 184   No results found for: "TSH" Lab Results  Component Value Date   HGBA1C 5.8 01/16/2022    No results found for: "CHOL", "HDL", "LDLCALC", "LDLDIRECT", "TRIG", "CHOLHDL"  Significant Diagnostic Results in last 30 days:  No results found.  Assessment/Plan 1. S/P below knee amputation, left ([place]) Doing well iN SNF Has Prostheisis but mostly stays in Wheelchair  2. Parkinson's disease, unspecified whether dyskinesia present, unspecified whether manifestations fluctuate On Sinemet Follows with Dr [name]  3. Bilateral leg edema Stable on Low dose of Toresimide 4 Constipation Manages with Miralax  5 Stage 3a chronic kidney disease ([place]) Creat stable 6 Scalp Pressure wound Has appoinmtnet with Derma to rule out Cancer    Family/ staff Communication:   Labs/tests ordered:

## 2022-05-11 IMAGING — RF DG PELVIS 3+V JUDET
1 series · 8 of 8 positions shown · IV contrast (agent unspecified)
Comparison: None.

CLINICAL DATA: Open reduction and internal fixation of right
acetabular fracture.

EXAM:
DG C-ARM 1-60 MIN; JUDET PELVIS - 3+ VIEW
CONTRAST:  None.
FLUOROSCOPY TIME:  Radiation Exposure Index (if provided by the
fluoroscopic device): 5.8 mGy.
Number of Acquired Spot Images: 8.

[Series 1: run · 8 of 8 slices shown]
[im 1/8]
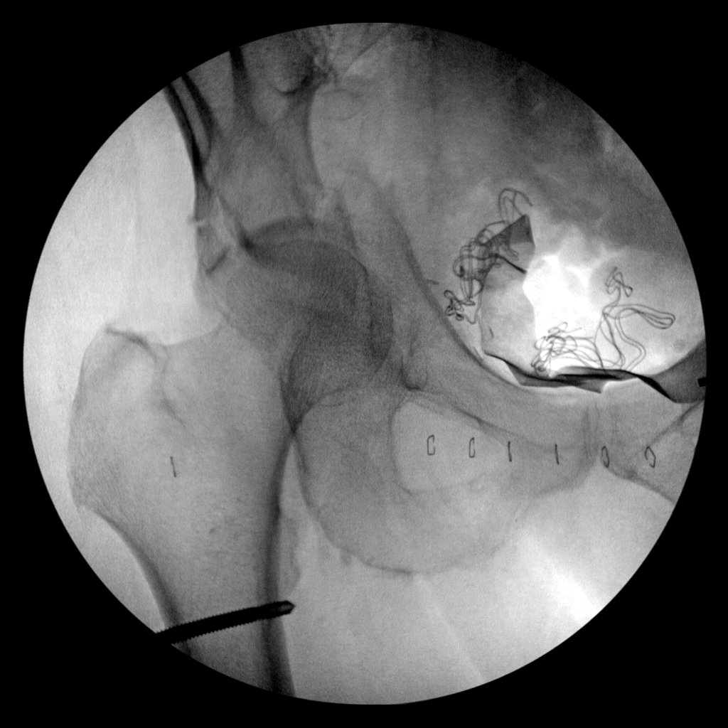
[im 2/8]
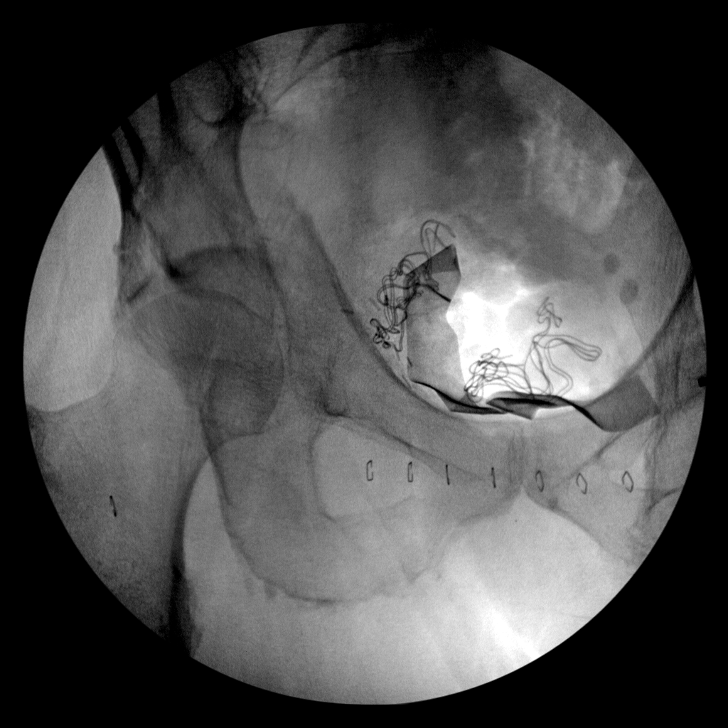
[im 3/8]
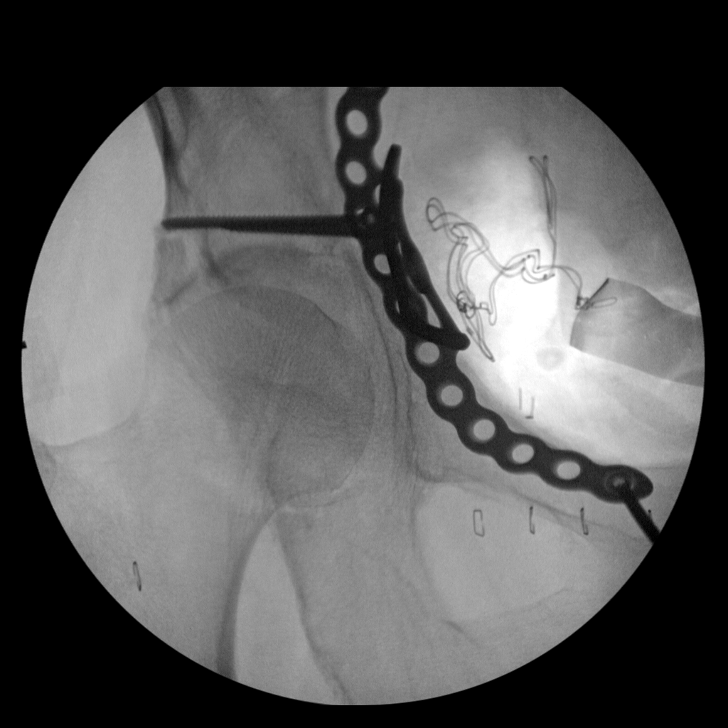
[im 4/8]
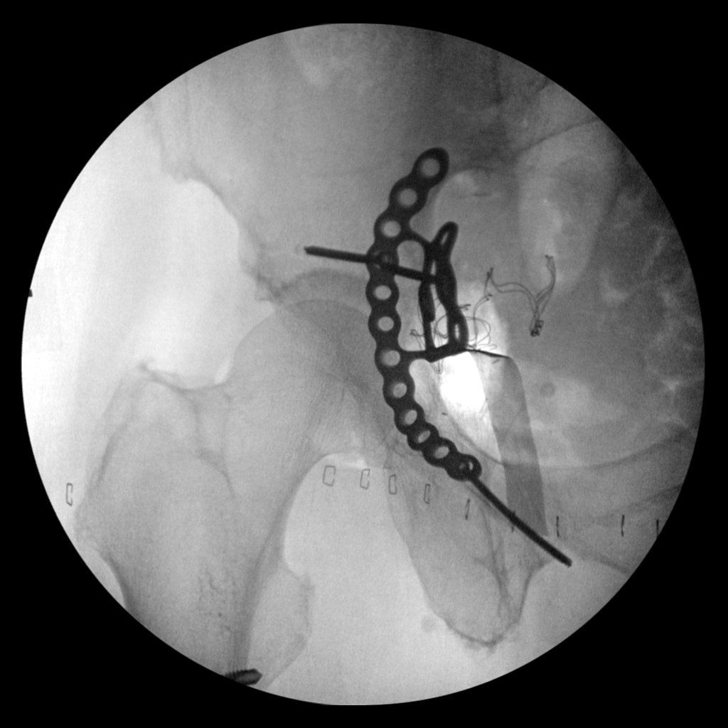
[im 5/8]
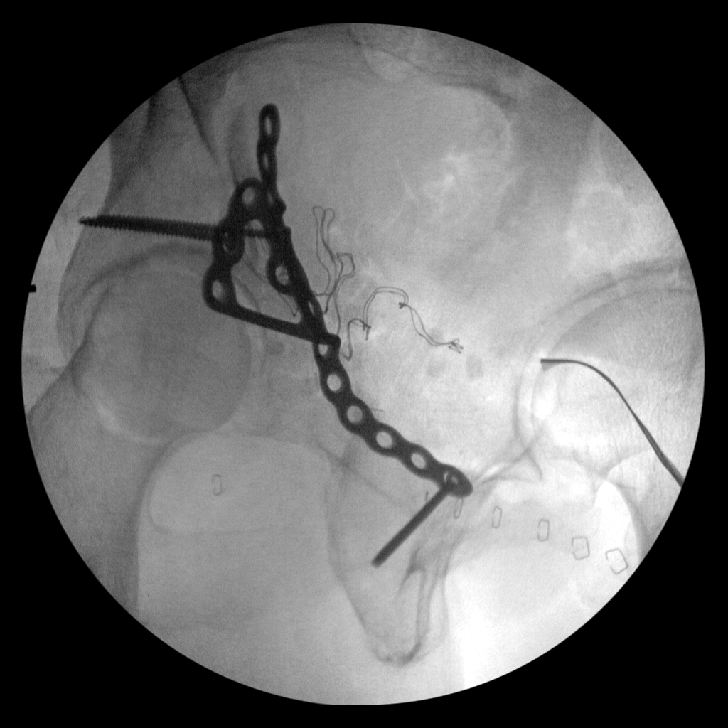
[im 6/8]
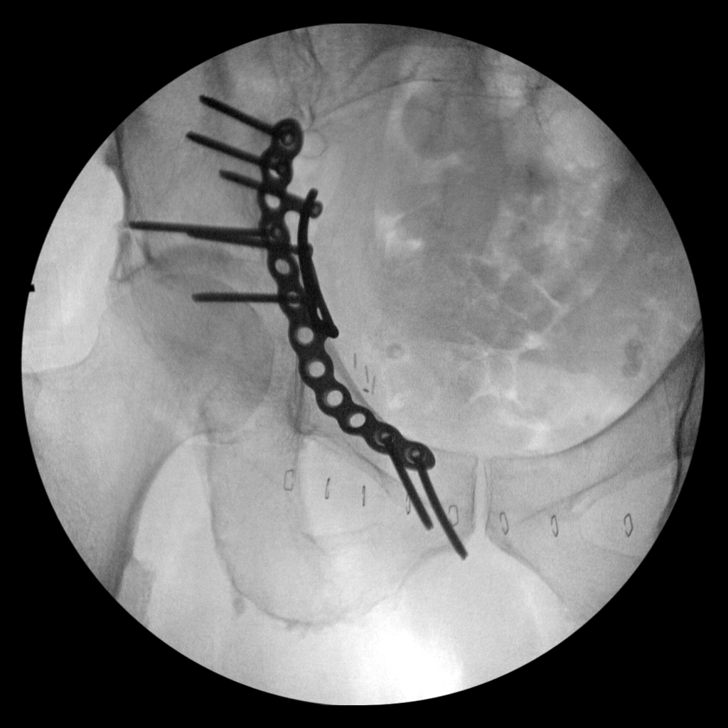
[im 7/8]
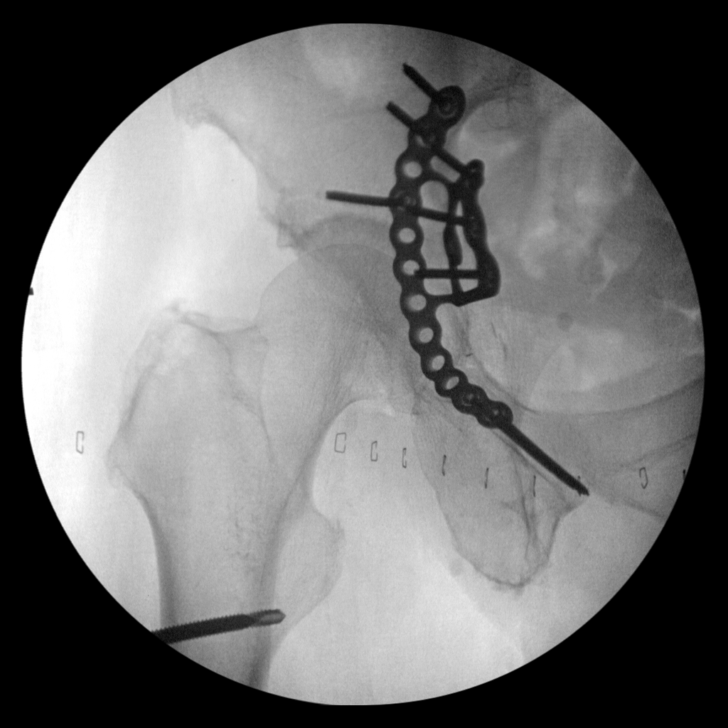
[im 8/8]
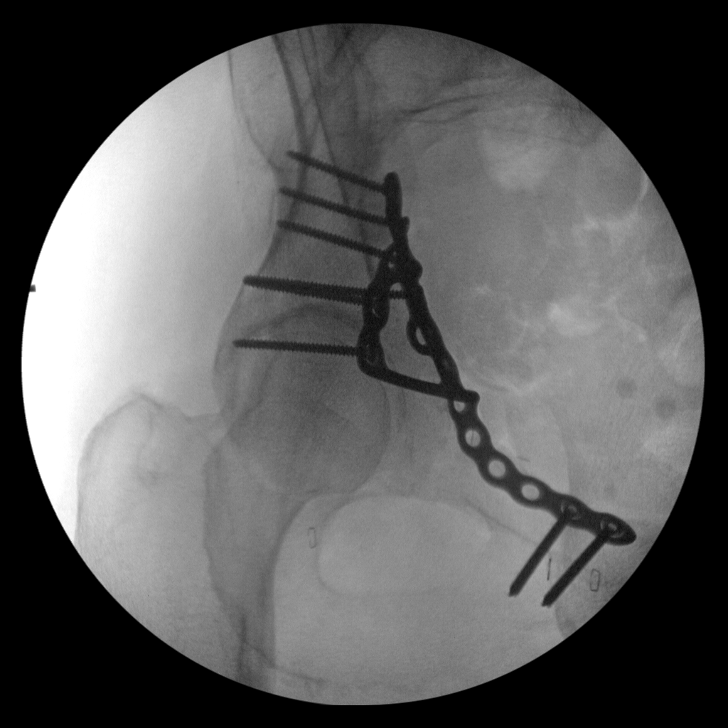

[8 of 8 positions shown; findings below may reference images not displayed]

FINDINGS: Eight intraoperative fluoroscopic images were obtained of the right
pelvis. These images demonstrate surgical reduction and internal
fixation of right acetabular fracture. Improved alignment of
fracture components is noted.
IMPRESSION: Fluoroscopic guidance provided during open reduction and internal
fixation of right acetabular fracture.

## 2022-05-30 ENCOUNTER — Non-Acute Institutional Stay (SKILLED_NURSING_FACILITY): Payer: Medicare Other | Admitting: Orthopedic Surgery

## 2022-05-30 ENCOUNTER — Encounter: Payer: Self-pay | Admitting: Orthopedic Surgery

## 2022-05-30 DIAGNOSIS — I10 Essential (primary) hypertension: Secondary | ICD-10-CM

## 2022-05-30 DIAGNOSIS — D500 Iron deficiency anemia secondary to blood loss (chronic): Secondary | ICD-10-CM

## 2022-05-30 DIAGNOSIS — Z89512 Acquired absence of left leg below knee: Secondary | ICD-10-CM

## 2022-05-30 DIAGNOSIS — N1831 Chronic kidney disease, stage 3a: Secondary | ICD-10-CM

## 2022-05-30 DIAGNOSIS — G20A1 Parkinson's disease without dyskinesia, without mention of fluctuations: Secondary | ICD-10-CM | POA: Diagnosis not present

## 2022-05-30 DIAGNOSIS — R600 Localized edema: Secondary | ICD-10-CM | POA: Diagnosis not present

## 2022-05-30 DIAGNOSIS — B3789 Other sites of candidiasis: Secondary | ICD-10-CM

## 2022-05-30 DIAGNOSIS — L02411 Cutaneous abscess of right axilla: Secondary | ICD-10-CM

## 2022-05-30 DIAGNOSIS — R7303 Prediabetes: Secondary | ICD-10-CM

## 2022-05-30 MED ORDER — NYSTATIN 100000 UNIT/GM EX POWD: 1.0000 | Freq: Two times a day (BID) | CUTANEOUS | 0 refills | Status: AC

## 2022-05-30 NOTE — Progress Notes (Signed)
Location:   [place] Room Number: 27-A Place of Service:  SNF [phone]) Provider:  Windell Moulding, NP  PCP: Virgie Dad, MD  Patient Care Team: Virgie Dad, MD as PCP - General (Internal Medicine) [name], Eustace Quail, DO as Consulting Physician (Neurology)  Extended Emergency Contact Information Primary Emergency Contact: Tonkovich,jane Address: [place].          apt [phone] Johnnette Litter of [place] Phone: [phone] Mobile Phone: [phone] Relation: Spouse Secondary Emergency Contact: Connole,kenneth Home Phone: [phone] Mobile Phone: [phone] Relation: Son  Code Status:  FULL CODE Goals of care: Advanced Directive information    05/30/2022    9:15 AM  Advanced Directives  Does Patient Have a Medical Advance Directive? Yes  Type of Paramedic of [place];Living will  Does patient want to make changes to medical advance directive? No - Patient declined  Copy of [place] in Chart? Yes - validated most recent copy scanned in chart (See row information)     Chief Complaint  Patient presents with   Medical Management of Chronic Issues    Routine Visit.    Health Maintenance    Discuss the need for AWV   Immunizations    Discuss the need for Covid Booster, and DTAP vaccine.     HPI:  Pt is a 89 y.o. male seen today for medical management of chronic diseases.    Candidal skin rash to groin- increased skin irritation to groin folds, improved with nystatin in past S/p left BKA- surgery 03/29 by Dr. Sharol Given due to ongoing osteomyelitis (+MRSA) of left heel, no phantom pain, uses prosthesis to ambulate/ wheelchair for long distances, no recent falls Parkinson's-  diagnosed 2019, followed by Dr. Carles Collet, recent BIMS 15/15 (02/06)> was 15/15 01/27/2022, remains on sinemet BLE- LV EF 60-65% 02/20/2021, improved since amputation, remains on torsemide  HTN- BUN/creat 38/1.2 03/03/2022, coreg  discontinued due to hypotension CKD 3b- see above Prediabetes-  A1c 5.8 01/16/2022 Anemia- hgb 12.5 03/03/2022, remains on ferrous sulfate M/W/F  Recently saw dermatology, lesions to head healed. Abscess to right arm healed.   Recent blood pressures:  02/20- 130/72  02/13- 150/79  02/06- 136/76  Recent weights:  02/03- 184.7 lbs  01/05- 180.9 lbs  12/03- 180.9 lbs  Past Medical History:  Diagnosis Date   Acute bronchiolitis    Acute osteomyelitis of calcaneum, left ([place]) 01/11/2021   Anemia    Bursitis of both hips    Complication of anesthesia    wife states pt was a little "loopy" for several days   Dementia ([place])    due to Parkinson's   Hearing reduced    hearing aid   History of basal cell carcinoma    Hyperlipidemia    Malaise and fatigue    MRSA infection 01/11/2021   Osteoarthritis    hand   Osteoporosis    Palpitation    Parkinson's disease    Peripheral vascular disease ([place])    blood clots   Pre-diabetes    Vitamin D deficiency    Past Surgical History:  Procedure Laterality Date   ABDOMINAL AORTOGRAM W/LOWER EXTREMITY N/A 06/12/2021   Procedure: ABDOMINAL AORTOGRAM W/LOWER EXTREMITY;  Surgeon: Wellington Hampshire, MD;  Location: [facility] CV LAB;  Service: Cardiovascular;  Laterality: N/A;   AMPUTATION Left 07/03/2021   Procedure: LEFT BELOW KNEE AMPUTATION;  Surgeon: Newt Minion, MD;  Location: [facility];  Service: Orthopedics;  Laterality: Left;   CATARACT EXTRACTION  05/31/2015   Eye/right   COLONOSCOPY  02/05/2005, 03/06/2005   normal, 10 years ago   MOHS micrographic surgery     Face surgery   ORIF ACETABULAR FRACTURE Right 10/09/2020   Procedure: OPEN REDUCTION INTERNAL FIXATION (ORIF) ACETABULAR FRACTURE;  Surgeon: Altamese Graeagle, MD;  Location: [facility];  Service: Orthopedics;  Laterality: Right;    No Known Allergies  Allergies as of 05/30/2022   No Known Allergies      Medication List        Accurate as of May 30, 2022  9:15 AM. If  you have any questions, ask your nurse or doctor.          acetaminophen 500 MG tablet Commonly known as: TYLENOL Take 1,000 mg by mouth 3 (three) times daily as needed for mild pain or moderate pain.   albuterol 108 (90 Base) MCG/ACT inhaler Commonly known as: VENTOLIN HFA Inhale 2 puffs into the lungs every 4 (four) hours as needed for wheezing or shortness of breath.   calcium carbonate 1500 (600 Ca) MG Tabs tablet Commonly known as: OSCAL Take 600 mg of elemental calcium by mouth daily with breakfast.   carbidopa-levodopa 25-100 MG tablet Commonly known as: SINEMET IR Take 1 tablet by mouth in the morning, at noon, in the evening, and at bedtime. 06:00 AM, 12:00 PM, 06:00 PM, 12:00 AM   carbidopa-levodopa 50-200 MG tablet Commonly known as: SINEMET CR Take 1 tablet by mouth at bedtime.   cyanocobalamin 1000 MCG tablet Commonly known as: VITAMIN B12 Take 1,000 mcg by mouth daily.   feeding supplement (PRO-STAT SUGAR FREE 64) Liqd Take 30 mLs by mouth in the morning and at bedtime.   ferrous sulfate 325 (65 FE) MG tablet Take 325 mg by mouth every Monday, Wednesday, and Friday.   polyethylene glycol 17 g packet Commonly known as: MIRALAX / GLYCOLAX Take 17 g by mouth every other day.   PRESERVISION AREDS 2 PO Take 1 capsule by mouth in the morning and at bedtime.   senna 8.6 MG tablet Commonly known as: SENOKOT Take 1 tablet by mouth daily.   torsemide 20 MG tablet Commonly known as: DEMADEX Take 20 mg by mouth 3 (three) times a week.   Vitamin D (Cholecalciferol) 25 MCG (1000 UT) Tabs Take 1,000 Units by mouth daily.   zinc oxide 20 % ointment Apply 1 application. topically See admin instructions. To buttocks after every incontinent episode and as needed for redness.        Review of Systems  Constitutional:  Negative for activity change and appetite change.  HENT:  Negative for congestion and trouble swallowing.   Eyes:  Negative for discharge.   Respiratory:  Negative for cough, shortness of breath and wheezing.   Cardiovascular:  Positive for leg swelling. Negative for chest pain.  Gastrointestinal:  Positive for constipation. Negative for abdominal distention, abdominal pain, diarrhea, nausea and vomiting.  Genitourinary:  Negative for dysuria, hematuria and scrotal swelling.  Musculoskeletal:  Positive for arthralgias and gait problem.  Skin:  Negative for wound.  Neurological:  Positive for tremors and weakness. Negative for dizziness and headaches.  Psychiatric/Behavioral:  Negative for confusion, dysphoric mood and sleep disturbance. The patient is not nervous/anxious.     Immunization History  Administered Date(s) Administered   COVID-19, mRNA, vaccine(Comirnaty)12 years and older 02/11/2022   Fluad Quad(high Dose 65+) 01/29/2022   H1N1 06/13/2008   Influenza Split 03/05/2006, 02/26/2007, 01/26/2008, 01/18/2009, 12/12/2009, 01/16/2011,  01/29/2012, 02/01/2013, 02/08/2014, 02/13/2015, 01/10/2016, 02/23/2017, 02/24/2018, 01/24/2019, 12/22/2019   Influenza, High Dose Seasonal PF 01/30/2021   Moderna SARS-COV2 Booster Vaccination 09/18/2021   PFIZER(Purple Top)SARS-COV-2 Vaccination 05/04/2019, 05/25/2019, 09/01/2020, 02/11/2022   Pfizer Covid-19 Vaccine Bivalent Booster 73yr & up 12/27/2019, 12/26/2020   Pneumococcal Conjugate-13 02/10/2014   Pneumococcal Polysaccharide-23 06/10/2011   Zoster Recombinat (Shingrix) 07/04/2009, 03/24/2022   Pertinent  Health Maintenance Due  Topic Date Due   INFLUENZA VACCINE  Completed      07/04/2021    7:18 PM 07/05/2021    7:15 AM 01/30/2022    2:44 PM 03/11/2022    1:03 PM 04/18/2022    9:20 AM  Fall Risk  Falls in the past year?   0 0 0  Was there an injury with Fall?   0 0 0  Fall Risk Category Calculator   0 0 0  Fall Risk Category (Retired)   Low Low Low  (RETIRED) Patient Fall Risk Level Moderate fall risk High fall risk Low fall risk Low fall risk Low fall risk  Patient at  Risk for Falls Due to   History of fall(s)  No Fall Risks  Fall risk Follow up   Falls evaluation completed Falls evaluation completed Falls evaluation completed   Functional Status Survey:    Vitals:   05/30/22 0908  BP: 130/72  Pulse: 73  Resp: (!) 21  Temp: 97.8 F (36.6 C)  SpO2: 99%  Weight: 184 lb 11.2 oz (83.8 kg)  Height: '5\' 10"'$  (1.778 m)   Body mass index is 26.5 kg/m. Physical Exam Vitals reviewed.  Constitutional:      General: He is not in acute distress. HENT:     Head: Normocephalic.  Eyes:     General:        Right eye: No discharge.        Left eye: No discharge.  Cardiovascular:     Rate and Rhythm: Normal rate and regular rhythm.     Pulses: Normal pulses.     Heart sounds: Murmur heard.  Pulmonary:     Effort: Pulmonary effort is normal. No respiratory distress.     Breath sounds: Normal breath sounds. No wheezing or rales.  Abdominal:     General: Bowel sounds are normal. There is no distension.     Palpations: Abdomen is soft.     Tenderness: There is no abdominal tenderness.  Musculoskeletal:     Cervical back: Neck supple.     Right lower leg: No edema.     Comments: Left BKA  Skin:    General: Skin is warm and dry.     Capillary Refill: Capillary refill takes less than 2 seconds.     Comments: Abscess to right axilla healed  Neurological:     General: No focal deficit present.     Mental Status: He is alert.  Psychiatric:        Mood and Affect: Mood normal.        Behavior: Behavior normal.     Labs reviewed: Recent Labs    06/04/21 0957 06/04/21 0957 06/20/21 1120 07/03/21 0913 07/08/21 0000 07/18/21 0000 03/03/22 0000  NA 138   < > 136 136 139 137 140  K 4.4  --  4.6 4.4 4.2 4.5 4.5  CL 97  --  99 102 102 100 104  CO2 29  --  27 26 29* 32* 31*  GLUCOSE 83  --  121* 96  --   --   --  BUN 35*   < > 32* 34* 35* 34* 38*  CREATININE 1.49*  --  1.51* 1.49* 1.1 1.1 1.2  CALCIUM 8.9  --  9.1 9.0 8.5* 8.9 8.8  MG  --   --    --  2.2  --   --   --    < > = values in this interval not displayed.   Recent Labs    06/20/21 1120 07/03/21 0913 07/08/21 0000 03/03/22 0000  AST 12 14* 12* 12*  ALT 6* 6 3* 5*  ALKPHOS  --  48 45 61  BILITOT 0.6 0.5  --   --   PROT 6.9 6.9  --   --   ALBUMIN  --  3.4* 3.2* 3.5   Recent Labs    07/03/21 1618 07/04/21 0128 07/05/21 0302 07/18/21 0000 07/25/21 0000 10/31/21 0000 03/03/22 0000  WBC 7.4 8.8 6.9 5.5 6.0 6.3 5.2  NEUTROABS 6.6  --   --  3,471.00  --   --  2,938.00  HGB 9.5* 7.6* 8.3* 9.8* 10.3* 13.2* 12.5*  HCT 29.6* 24.0* 25.6* 30* 32* 41 38*  MCV 91.9 92.0 89.8  --   --   --   --   PLT 207 179 198 276 242 217 184   No results found for: "TSH" Lab Results  Component Value Date   HGBA1C 5.8 01/16/2022   No results found for: "CHOL", "HDL", "LDLCALC", "LDLDIRECT", "TRIG", "CHOLHDL"  Significant Diagnostic Results in last 30 days:  No results found.  Assessment/Plan 1. Candida rash of groin - start nystatin 100,000 units/gram- apply to skin folds BID x 7 days, then BID prn  2. S/P below knee amputation, left ([place]) - surgery 07/03/2021 due to ongoing osteomyelitis left heel, +MRSA - doing well with prosthesis, will use wheelchair for long distances - no phantom pain - cont skilled nursing  3. Parkinson's disease, unspecified whether dyskinesia present, unspecified whether manifestations fluctuate - followed by Dr. Carles Collet - Recent BIMS 15/15 - cont sinemet  4. Bilateral leg edema - cont torsemide   5. Stage 3a chronic kidney disease ([facility]) - avoid nephrotoxic drugs like NSAIDS and dose adjust medications to be renally excreted - encourage hydration with water   6. Cutaneous abscess of right axilla - resolved   7. Primary hypertension - off antihypertensives due to hypotension  8. Prediabetes - A1c stable - diet controlled  9. Blood loss anemia - hgb stable - cont ferrous sulfate   Family/ staff Communication: Plan discussed with  patient and nurse  Labs/tests ordered: none

## 2022-06-04 ENCOUNTER — Encounter: Payer: Self-pay | Admitting: Orthopedic Surgery

## 2022-06-04 ENCOUNTER — Non-Acute Institutional Stay (INDEPENDENT_AMBULATORY_CARE_PROVIDER_SITE_OTHER): Payer: Medicare Other | Admitting: Orthopedic Surgery

## 2022-06-04 DIAGNOSIS — Z0000 Encounter for general adult medical examination without abnormal findings: Secondary | ICD-10-CM

## 2022-06-04 NOTE — Progress Notes (Signed)
Subjective:   Greg [name] is a 89 y.o. male who presents for Medicare Annual/Subsequent preventive examination.  Place of Service: [facility]- assisted living Provider: Windell Moulding, AGNP-C   Review of Systems     Cardiac Risk Factors include: advanced age (>52mn, >>53women);hypertension;male gender;sedentary lifestyle     Objective:    Today's Vitals   06/04/22 0952 06/04/22 1307  BP: 138/74   Pulse: 76   Resp: 20   Temp: (!) 97.2 F (36.2 C)   SpO2: 93%   Weight: 184 lb 11.2 oz (83.8 kg)   Height: '5\' 10"'$  (1.778 m)   PainSc:  0-No pain   Body mass index is 26.5 kg/m.     06/04/2022    9:55 AM 05/30/2022    9:15 AM 05/01/2022   11:51 AM 04/18/2022    9:21 AM 04/02/2022   10:17 AM 01/30/2022    2:44 PM 01/07/2022   11:41 AM  Advanced Directives  Does Patient Have a Medical Advance Directive? Yes Yes Yes Yes Yes Yes Yes  Type of AParamed[place]of [place] will [place]Living will [place]Living will HShell RidgeLiving will Living will;Healthcare Power of [place]Chevy ChaseLiving will [place]Manitow[place]Living will  Does patient want to make changes to medical advance directive? No - Patient declined No - Patient declined  No - Patient declined No - Patient declined  No - Patient declined  Copy of [place] Chart? Yes - validated most recent copy scanned in chart (See row information) Yes - validated most recent copy scanned in chart (See row information) Yes - validated most recent copy scanned in chart (See row information) Yes - validated most recent copy scanned in chart (See row information) Yes - validated most recent copy scanned in chart (See row information) Yes - validated most recent copy scanned in chart (See row information) Yes - validated most recent copy scanned in chart (See row information)    Current Medications  (verified) Outpatient Encounter Medications as of 06/04/2022  Medication Sig   acetaminophen (TYLENOL) 500 MG tablet Take 1,000 mg by mouth 3 (three) times daily as needed for mild pain or moderate pain.   albuterol (VENTOLIN HFA) 108 (90 Base) MCG/ACT inhaler Inhale 2 puffs into the lungs every 4 (four) hours as needed for wheezing or shortness of breath.   Amino Acids-Protein Hydrolys (FEEDING SUPPLEMENT, PRO-STAT SUGAR FREE 64,) LIQD Take 30 mLs by mouth in the morning and at bedtime.   calcium carbonate (OSCAL) 1500 (600 Ca) MG TABS tablet Take 600 mg of elemental calcium by mouth daily with breakfast.   carbidopa-levodopa (SINEMET CR) 50-200 MG tablet Take 1 tablet by mouth at bedtime.   carbidopa-levodopa (SINEMET IR) 25-100 MG tablet Take 1 tablet by mouth in the morning, at noon, in the evening, and at bedtime. 06:00 AM, 12:00 PM, 06:00 PM, 12:00 AM   ferrous sulfate 325 (65 FE) MG tablet Take 325 mg by mouth every Monday, Wednesday, and Friday.   Multiple Vitamins-Minerals (PRESERVISION AREDS 2 PO) Take 1 capsule by mouth in the morning and at bedtime.   nystatin (MYCOSTATIN/NYSTOP) powder Apply 1 Application topically 2 (two) times daily for 7 days.   polyethylene glycol (MIRALAX / GLYCOLAX) 17 g packet Take 17 g by mouth every other day.   senna (SENOKOT) 8.6 MG tablet Take 1 tablet by mouth daily.   torsemide (DEMADEX) 20 MG tablet  Take 20 mg by mouth 3 (three) times a week.   vitamin B-12 (CYANOCOBALAMIN) 1000 MCG tablet Take 1,000 mcg by mouth daily.   Vitamin D, Cholecalciferol, 25 MCG (1000 UT) TABS Take 1,000 Units by mouth daily.   zinc oxide 20 % ointment Apply 1 application. topically See admin instructions. To buttocks after every incontinent episode and as needed for redness.   No facility-administered encounter medications on file as of 06/04/2022.    Allergies (verified) Patient has no known allergies.   History: Past Medical History:  Diagnosis Date   Acute  bronchiolitis    Acute osteomyelitis of calcaneum, left ([place]) 01/11/2021   Anemia    Bursitis of both hips    Complication of anesthesia    wife states pt was a little "loopy" for several days   Dementia ([place])    due to Parkinson's   Hearing reduced    hearing aid   History of basal cell carcinoma    Hyperlipidemia    Malaise and fatigue    MRSA infection 01/11/2021   Osteoarthritis    hand   Osteoporosis    Palpitation    Parkinson's disease    Peripheral vascular disease ([place])    blood clots   Pre-diabetes    Vitamin D deficiency    Past Surgical History:  Procedure Laterality Date   ABDOMINAL AORTOGRAM W/LOWER EXTREMITY N/A 06/12/2021   Procedure: ABDOMINAL AORTOGRAM W/LOWER EXTREMITY;  Surgeon: Wellington Hampshire, MD;  Location: [facility];  Service: Cardiovascular;  Laterality: N/A;   AMPUTATION Left 07/03/2021   Procedure: LEFT BELOW KNEE AMPUTATION;  Surgeon: Newt Minion, MD;  Location: [facility];  Service: Orthopedics;  Laterality: Left;   CATARACT EXTRACTION  05/31/2015   Eye/right   COLONOSCOPY  02/05/2005, 03/06/2005   normal, 10 years ago   MOHS micrographic surgery     Face surgery   ORIF ACETABULAR FRACTURE Right 10/09/2020   Procedure: OPEN REDUCTION INTERNAL FIXATION (ORIF) ACETABULAR FRACTURE;  Surgeon: Altamese Peterman, MD;  Location: [facility];  Service: Orthopedics;  Laterality: Right;   Family History  Problem Relation Age of Onset   Prostate cancer Father    Skin cancer Brother    Social History   Socioeconomic History   Marital status: Married    Spouse name: [name]   Number of children: 2   Years of education: Not on file   Highest education level: Not on file  Occupational History   Occupation: retired    Comment: art professor  Tobacco Use   Smoking status: Never   Smokeless tobacco: Never  Vaping Use   Vaping Use: Never used  Substance and Sexual Activity   Alcohol use: Never   Drug use: Never   Sexual activity: Not on file   Other Topics Concern   Not on file  Social History Narrative   Pt just move from [place] to [place] leaving with wife   Social Determinants of Health   Financial Resource Strain: Low Risk  (06/04/2022)   Overall Financial Resource Strain (CARDIA)    Difficulty of Paying Living Expenses: Not hard at all  Food Insecurity: No Food Insecurity (06/04/2022)   Hunger Vital Sign    Worried About Running Out of Food in the Last Year: Never true    [place] in the Last Year: Never true  Transportation Needs: Unknown (06/04/2022)   PRAPARE - Hydrologist (Medical): No  Lack of Transportation (Non-Medical): Not on file  Physical Activity: Insufficiently Active (06/04/2022)   Exercise Vital Sign    Days of Exercise per Week: 4 days    Minutes of Exercise per Session: 20 min  Stress: No Stress Concern Present (06/04/2022)   [place]    Feeling of Stress : Not at all  Social Connections: Moderately Integrated (06/04/2022)   Social Connection and Isolation Panel [NHANES]    Frequency of Communication with Friends and Family: More than three times a week    Frequency of Social Gatherings with Friends and Family: Twice a week    Attends Religious Services: 1 to 4 times per year    Active Member of [employer] or Organizations: No    Attends Music therapist: Never    Marital Status: Married    Tobacco Counseling Counseling given: Not Answered   Clinical Intake:  Pre-visit preparation completed: No  Pain : No/denies pain Pain Score: 0-No pain     BMI - recorded: 26.5 Nutritional Status: BMI 25 -29 Overweight Nutritional Risks: None Diabetes: Yes (Prediabetes) CBG done?: No Did pt. bring in CBG monitor from home?: No  How often do you need to have someone help you when you read instructions, pamphlets, or other written materials from your doctor or pharmacy?: 3 -  Sometimes What is the last grade level you completed in school?: College  Diabetic?Yes- prediabetes  Interpreter Needed?: No      Activities of Daily Living    06/04/2022    1:12 PM 07/03/2021    9:01 AM  In your present state of health, do you have any difficulty performing the following activities:  Hearing? 0   Vision? 0   Difficulty concentrating or making decisions? 0   Walking or climbing stairs? 1   Dressing or bathing? 1   Doing errands, shopping? 1 1  Preparing Food and eating ? Y   Using the Toilet? Y   In the past six months, have you accidently leaked urine? Y   Do you have problems with loss of bowel control? Y   Managing your Medications? Y   Managing your Finances? Y   Housekeeping or managing your Housekeeping? Y     Patient Care Team: Virgie Dad, MD as PCP - General (Internal Medicine) [name], Eustace Quail, DO as Consulting Physician (Neurology)  Indicate any recent Medical Services you may have received from other than [facility] providers in the past year (date may be approximate).     Assessment:   This is a routine wellness examination for Merlyn.  Hearing/Vision screen No results found.  Dietary issues and exercise activities discussed: Current Exercise Habits: The patient does not participate in regular exercise at present, Exercise limited by: neurologic condition(s);orthopedic condition(s) (h/o parkinson's, left BTA)   Goals Addressed             This Visit's Progress    Maintain Mobility and Function   On track    Evidence-based guidance:  Acknowledge and validate impact of pain, loss of strength and potential disfigurement (hand osteoarthritis) on mental health and daily life, such as social isolation, anxiety, depression, impaired sexual relationship and   injury from falls.  Anticipate referral to physical or occupational therapy for assessment, therapeutic exercise and recommendation for adaptive equipment or assistive devices; encourage  participation.  Assess impact on ability to perform activities of daily living, as well as engage in sports and leisure events or  requirements of work or school.  Provide anticipatory guidance and reassurance about the benefit of exercise to maintain function; acknowledge and normalize fear that exercise may worsen symptoms.  Encourage regular exercise, at least 10 minutes at a time for 45 minutes per week; consider yoga, water exercise and proprioceptive exercises; encourage use of wearable activity tracker to increase motivation and adherence.  Encourage maintenance or resumption of daily activities, including employment, as pain allows and with minimal exposure to trauma.  Assist patient to advocate for adaptations to the work environment.  Consider level of pain and function, gender, age, lifestyle, patient preference, quality of life, readiness and ?ocapacity to benefit? when recommending patients for orthopaedic surgery consultation.  Explore strategies, such as changes to medication regimen or activity that enables patient to anticipate and manage flare-ups that increase deconditioning and disability.  Explore patient preferences; encourage exposure to a broader range of activities that have been avoided for fear of experiencing pain.  Identify barriers to participation in therapy or exercise, such as pain with activity, anticipated or imagined pain.  Monitor postoperative joint replacement or any preexisting joint replacement for ongoing pain and loss of function; provide social support and encouragement throughout recovery.   Notes:        Depression Screen    06/04/2022    1:11 PM 04/18/2022    9:20 AM 01/30/2022    2:44 PM 06/20/2021   10:38 AM 03/22/2021   10:37 AM  PHQ 2/9 Scores  PHQ - 2 Score 0 0 0 0 0    Fall Risk    06/04/2022    1:12 PM 04/18/2022    9:20 AM 03/11/2022    1:03 PM 01/30/2022    2:44 PM 06/20/2021   10:38 AM  Fall Risk   Falls in the past year? 0 0 0 0  1  Number falls in past yr: 0 0 0 0 0  Injury with Fall? 0 0 0 0 1  Comment     Had surgery after  Risk for fall due to : History of fall(s);Impaired balance/gait;Impaired mobility No Fall Risks  History of fall(s) Impaired balance/gait;Impaired mobility;Orthopedic patient;History of fall(s)  Follow up Falls evaluation completed;Education provided;Falls prevention discussed Falls evaluation completed Falls evaluation completed Falls evaluation completed Falls evaluation completed    FALL RISK PREVENTION PERTAINING TO THE HOME:  Any stairs in or around the home? No  If so, are there any without handrails? No  Home free of loose throw rugs in walkways, pet beds, electrical cords, etc? Yes  Adequate lighting in your home to reduce risk of falls? Yes   ASSISTIVE DEVICES UTILIZED TO PREVENT FALLS:  Life alert? No  Use of a cane, walker or w/c? Yes  Grab bars in the bathroom? Yes  Shower chair or bench in shower? Yes  Elevated toilet seat or a handicapped toilet? Yes   TIMED UP AND GO:  Was the test performed? No .  Length of time to ambulate 10 feet: N/A sec.   Gait slow and steady with assistive device  Cognitive Function:    06/04/2022    1:12 PM 05/03/2021    3:40 PM  MMSE - Mini Mental State Exam  Not completed:  Refused  Orientation to time 5   Orientation to Place 5   Registration 3   Attention/ Calculation 5   Recall 3   Language- name 2 objects 2   Language- repeat 1   Language- follow 3 step command 3   Language-  read & follow direction 1   Write a sentence 1   Copy design 1   Total score 30         05/03/2021    3:40 PM  6CIT Screen  What Year? 0 points  What month? 0 points  What time? 0 points  Count back from 20 0 points  Months in reverse 0 points  Repeat phrase 0 points  Total Score 0 points    Immunizations Immunization History  Administered Date(s) Administered   COVID-19, mRNA, vaccine(Comirnaty)12 years and older 02/11/2022   Fluad  Quad(high Dose 65+) 01/29/2022   H1N1 06/13/2008   Influenza Split 03/05/2006, 02/26/2007, 01/26/2008, 01/18/2009, 12/12/2009, 01/16/2011, 01/29/2012, 02/01/2013, 02/08/2014, 02/13/2015, 01/10/2016, 02/23/2017, 02/24/2018, 01/24/2019, 12/22/2019   Influenza, High Dose Seasonal PF 01/30/2021   Moderna SARS-COV2 Booster Vaccination 09/18/2021   PFIZER(Purple Top)SARS-COV-2 Vaccination 05/04/2019, 05/25/2019, 09/01/2020, 02/11/2022   Pfizer Covid-19 Vaccine Bivalent Booster 4yr & up 12/27/2019, 12/26/2020   Pneumococcal Conjugate-13 02/10/2014   Pneumococcal Polysaccharide-23 06/10/2011   Zoster Recombinat (Shingrix) 07/04/2009, 03/24/2022    TDAP status: Due, Education has been provided regarding the importance of this vaccine. Advised may receive this vaccine at local pharmacy or Health Dept. Aware to provide a copy of the vaccination record if obtained from local pharmacy or Health Dept. Verbalized acceptance and understanding.  Flu Vaccine status: Up to date  Pneumococcal vaccine status: Up to date  Covid-19 vaccine status: Completed vaccines  Qualifies for Shingles Vaccine? Yes   Zostavax completed No   Shingrix Completed?: Yes  Screening Tests Health Maintenance  Topic Date Due   DTaP/Tdap/Td (1 - Tdap) Never done   COVID-19 Vaccine (8 - 2023-24 season) 12/05/2022 (Originally 04/08/2022)   Medicare Annual Wellness (AWV)  06/05/2023   Pneumonia Vaccine 89 Years old  Completed   INFLUENZA VACCINE  Completed   Zoster Vaccines- Shingrix  Completed   HPV VACCINES  Aged Out    Health Maintenance  Health Maintenance Due  Topic Date Due   DTaP/Tdap/Td (1 - Tdap) Never done    Colorectal cancer screening: No longer required.   Lung Cancer Screening: (Low Dose CT Chest recommended if Age 87-80years, 30 pack-year currently smoking OR have quit w/in 15years.) does not qualify.   Lung Cancer Screening Referral: No  Additional Screening:  Hepatitis C Screening: does not  qualify; Completed   Vision Screening: Recommended annual ophthalmology exams for early detection of glaucoma and other disorders of the eye. Is the patient up to date with their annual eye exam?  Yes  Who is the provider or what is the name of the office in which the patient attends annual eye exams? In house provider> completed 2023 If pt is not established with a provider, would they like to be referred to a provider to establish care? No .   Dental Screening: Recommended annual dental exams for proper oral hygiene  Community Resource Referral / Chronic Care Management: CRR required this visit?  No   CCM required this visit?  No      Plan:     I have personally reviewed and noted the following in the patient's chart:   Medical and social history Use of alcohol, tobacco or illicit drugs  Current medications and supplements including opioid prescriptions. Patient is not currently taking opioid prescriptions. Functional ability and status Nutritional status Physical activity Advanced directives List of other physicians Hospitalizations, surgeries, and ER visits in previous 12 months Vitals Screenings to include cognitive, depression, and falls Referrals and  appointments  In addition, I have reviewed and discussed with patient certain preventive protocols, quality metrics, and best practice recommendations. A written personalized care plan for preventive services as well as general preventive health recommendations were provided to patient.     Yvonna Alanis, NP   06/04/2022   Nurse Notes: Tdap vaccine recommended

## 2022-06-04 NOTE — Patient Instructions (Signed)
  [name] , Thank you for taking time to come for your Medicare Wellness Visit. I appreciate your ongoing commitment to your health goals. Please review the following plan we discussed and let me know if I can assist you in the future.   These are the goals we discussed:  Goals      Maintain Mobility and Function     Evidence-based guidance:  Acknowledge and validate impact of pain, loss of strength and potential disfigurement (hand osteoarthritis) on mental health and daily life, such as social isolation, anxiety, depression, impaired sexual relationship and   injury from falls.  Anticipate referral to physical or occupational therapy for assessment, therapeutic exercise and recommendation for adaptive equipment or assistive devices; encourage participation.  Assess impact on ability to perform activities of daily living, as well as engage in sports and leisure events or requirements of work or school.  Provide anticipatory guidance and reassurance about the benefit of exercise to maintain function; acknowledge and normalize fear that exercise may worsen symptoms.  Encourage regular exercise, at least 10 minutes at a time for 45 minutes per week; consider yoga, water exercise and proprioceptive exercises; encourage use of wearable activity tracker to increase motivation and adherence.  Encourage maintenance or resumption of daily activities, including employment, as pain allows and with minimal exposure to trauma.  Assist patient to advocate for adaptations to the work environment.  Consider level of pain and function, gender, age, lifestyle, patient preference, quality of life, readiness and ?ocapacity to benefit? when recommending patients for orthopaedic surgery consultation.  Explore strategies, such as changes to medication regimen or activity that enables patient to anticipate and manage flare-ups that increase deconditioning and disability.  Explore patient preferences; encourage  exposure to a broader range of activities that have been avoided for fear of experiencing pain.  Identify barriers to participation in therapy or exercise, such as pain with activity, anticipated or imagined pain.  Monitor postoperative joint replacement or any preexisting joint replacement for ongoing pain and loss of function; provide social support and encouragement throughout recovery.   Notes:         This is a list of the screening recommended for you and due dates:  Health Maintenance  Topic Date Due   DTaP/Tdap/Td vaccine (1 - Tdap) Never done   COVID-19 Vaccine (8 - 2023-24 season) 12/05/2022*   Medicare Annual Wellness Visit  06/05/2023   Pneumonia Vaccine  Completed   Flu Shot  Completed   Zoster (Shingles) Vaccine  Completed   HPV Vaccine  Aged Out  *Topic was postponed. The date shown is not the original due date.

## 2022-06-11 ENCOUNTER — Encounter: Payer: Self-pay | Admitting: Orthopedic Surgery

## 2022-06-11 ENCOUNTER — Non-Acute Institutional Stay (SKILLED_NURSING_FACILITY): Payer: Medicare Other | Admitting: Orthopedic Surgery

## 2022-06-11 DIAGNOSIS — L02212 Cutaneous abscess of back [any part, except buttock]: Secondary | ICD-10-CM | POA: Diagnosis not present

## 2022-06-11 DIAGNOSIS — G20A1 Parkinson's disease without dyskinesia, without mention of fluctuations: Secondary | ICD-10-CM

## 2022-06-11 NOTE — Progress Notes (Signed)
Location:   [facility] Room Number: 27-A Place of Service:  SNF [phone]) Provider:  Windell Moulding, NP  PCP: Virgie Dad, MD  Patient Care Team: Virgie Dad, MD as PCP - General (Internal Medicine) [name], Eustace Quail, DO as Consulting Physician (Neurology)  Extended Emergency Contact Information Primary Emergency Contact: Amico,jane Address: [place].          apt [phone] Johnnette Litter of [place] Phone: [phone] Mobile Phone: [phone] Relation: Spouse Secondary Emergency Contact: Henard,kenneth Home Phone: [phone] Mobile Phone: [phone] Relation: Son  Code Status:  FULL CODE Goals of care: Advanced Directive information    06/11/2022   10:37 AM  Advanced Directives  Does Patient Have a Medical Advance Directive? Yes  Type of Paramedic of [place];Living will  Does patient want to make changes to medical advance directive? No - Patient declined  Copy of [address] in Chart? Yes - validated most recent copy scanned in chart (See row information)     Chief Complaint  Patient presents with   Acute Visit    Skin abscess.    HPI:  Pt is a 89 y.o. male seen today for an acute visit for skin abscess.   He currently resides on the skilled nursing unit at [facility]. PMH: HFpEF, DVT right femoral vein, HTN, HLD, Parkinson's, PBA, osteomyelitis left calcaneus s/p left BKA, OA, osteoporosis, CKD, BPH, prediabetes, and anemia.   03/2022 skin abscess to right axilla > resolved with doxycycline x 7 days. Today, nursing reports increased tenderness to mid back due to raised nodule. He describes pain as "tender," rated 3/10, no radiation. No recent injury to area. Afebrile. Vitals stable.   He is followed by in house dermatologist. He has been showering with Dial soap due to recurrent skin infections.      Past Medical History:  Diagnosis Date   Acute bronchiolitis     Acute osteomyelitis of calcaneum, left ([place]) 01/11/2021   Anemia    Bursitis of both hips    Complication of anesthesia    wife states pt was a little "loopy" for several days   Dementia ([place])    due to Parkinson's   Hearing reduced    hearing aid   History of basal cell carcinoma    Hyperlipidemia    Malaise and fatigue    MRSA infection 01/11/2021   Osteoarthritis    hand   Osteoporosis    Palpitation    Parkinson's disease    Peripheral vascular disease ([place])    blood clots   Pre-diabetes    Vitamin D deficiency    Past Surgical History:  Procedure Laterality Date   ABDOMINAL AORTOGRAM W/LOWER EXTREMITY N/A 06/12/2021   Procedure: ABDOMINAL AORTOGRAM W/LOWER EXTREMITY;  Surgeon: Wellington Hampshire, MD;  Location: [facility];  Service: Cardiovascular;  Laterality: N/A;   AMPUTATION Left 07/03/2021   Procedure: LEFT BELOW KNEE AMPUTATION;  Surgeon: Newt Minion, MD;  Location: [facility];  Service: Orthopedics;  Laterality: Left;   CATARACT EXTRACTION  05/31/2015   Eye/right   COLONOSCOPY  02/05/2005, 03/06/2005   normal, 10 years ago   MOHS micrographic surgery     Face surgery   ORIF ACETABULAR FRACTURE Right 10/09/2020   Procedure: OPEN REDUCTION INTERNAL FIXATION (ORIF) ACETABULAR FRACTURE;  Surgeon: Altamese Belview, MD;  Location: [facility];  Service: Orthopedics;  Laterality: Right;    No Known Allergies  Allergies  as of 06/11/2022   No Known Allergies      Medication List        Accurate as of June 11, 2022 10:38 AM. If you have any questions, ask your nurse or doctor.          acetaminophen 500 MG tablet Commonly known as: TYLENOL Take 1,000 mg by mouth 3 (three) times daily as needed for mild pain or moderate pain.   albuterol 108 (90 Base) MCG/ACT inhaler Commonly known as: VENTOLIN HFA Inhale 2 puffs into the lungs every 4 (four) hours as needed for wheezing or shortness of breath.   calcium carbonate 1500 (600 Ca) MG Tabs tablet Commonly known as:  OSCAL Take 600 mg of elemental calcium by mouth daily with breakfast.   carbidopa-levodopa 25-100 MG tablet Commonly known as: SINEMET IR Take 1 tablet by mouth in the morning, at noon, in the evening, and at bedtime. 06:00 AM, 12:00 PM, 06:00 PM, 12:00 AM   carbidopa-levodopa 50-200 MG tablet Commonly known as: SINEMET CR Take 1 tablet by mouth at bedtime.   cyanocobalamin 1000 MCG tablet Commonly known as: VITAMIN B12 Take 1,000 mcg by mouth daily.   feeding supplement (PRO-STAT SUGAR FREE 64) Liqd Take 30 mLs by mouth in the morning and at bedtime.   ferrous sulfate 325 (65 FE) MG tablet Take 325 mg by mouth every Monday, Wednesday, and Friday.   nystatin ointment Commonly known as: MYCOSTATIN Apply 1 Application topically every 12 (twelve) hours as needed.   polyethylene glycol 17 g packet Commonly known as: MIRALAX / GLYCOLAX Take 17 g by mouth every other day.   PRESERVISION AREDS 2 PO Take 1 capsule by mouth in the morning and at bedtime.   senna 8.6 MG tablet Commonly known as: SENOKOT Take 1 tablet by mouth daily.   torsemide 20 MG tablet Commonly known as: DEMADEX Take 20 mg by mouth 3 (three) times a week.   Vitamin D (Cholecalciferol) 25 MCG (1000 UT) Tabs Take 1,000 Units by mouth daily.   zinc oxide 20 % ointment Apply 1 application. topically See admin instructions. To buttocks after every incontinent episode and as needed for redness.        Review of Systems  Constitutional:  Negative for activity change and appetite change.  Respiratory:  Negative for cough, shortness of breath and wheezing.   Cardiovascular:  Negative for chest pain and leg swelling.  Gastrointestinal:  Negative for abdominal distention and abdominal pain.  Musculoskeletal:  Positive for arthralgias and gait problem.  Skin:  Positive for wound.  Psychiatric/Behavioral:  Negative for dysphoric mood. The patient is not nervous/anxious.     Immunization History   Administered Date(s) Administered   COVID-19, mRNA, vaccine(Comirnaty)12 years and older 02/11/2022   Fluad Quad(high Dose 65+) 01/29/2022   H1N1 06/13/2008   Influenza Split 03/05/2006, 02/26/2007, 01/26/2008, 01/18/2009, 12/12/2009, 01/16/2011, 01/29/2012, 02/01/2013, 02/08/2014, 02/13/2015, 01/10/2016, 02/23/2017, 02/24/2018, 01/24/2019, 12/22/2019   Influenza, High Dose Seasonal PF 01/30/2021   Moderna SARS-COV2 Booster Vaccination 09/18/2021   PFIZER(Purple Top)SARS-COV-2 Vaccination 05/04/2019, 05/25/2019, 09/01/2020, 02/11/2022   Pfizer Covid-19 Vaccine Bivalent Booster 34yr & up 12/27/2019, 12/26/2020   Pneumococcal Conjugate-13 02/10/2014   Pneumococcal Polysaccharide-23 06/10/2011   Zoster Recombinat (Shingrix) 07/04/2009, 03/24/2022   Pertinent  Health Maintenance Due  Topic Date Due   INFLUENZA VACCINE  Completed      07/05/2021    7:15 AM 01/30/2022    2:44 PM 03/11/2022    1:03 PM 04/18/2022    9:20  AM 06/04/2022    1:12 PM  Fall Risk  Falls in the past year?  0 0 0 0  Was there an injury with Fall?  0 0 0 0  Fall Risk Category Calculator  0 0 0 0  Fall Risk Category (Retired)  Low Low Low   (RETIRED) Patient Fall Risk Level High fall risk Low fall risk Low fall risk Low fall risk   Patient at Risk for Falls Due to  History of fall(s)  No Fall Risks History of fall(s);Impaired balance/gait;Impaired mobility  Fall risk Follow up  Falls evaluation completed Falls evaluation completed Falls evaluation completed Falls evaluation completed;Education provided;Falls prevention discussed   Functional Status Survey:    Vitals:   06/11/22 1032  BP: 132/78  Pulse: 70  Resp: 18  Temp: (!) 97.4 F (36.3 C)  SpO2: 94%  Weight: 185 lb 14.4 oz (84.3 kg)  Height: '5\' 10"'$  (1.778 m)   Body mass index is 26.67 kg/m. Physical Exam Vitals reviewed.  Constitutional:      General: He is not in acute distress. HENT:     Head: Normocephalic.  Eyes:     General:        Right  eye: No discharge.        Left eye: No discharge.  Cardiovascular:     Rate and Rhythm: Normal rate and regular rhythm.     Pulses: Normal pulses.     Heart sounds: Normal heart sounds.  Pulmonary:     Effort: Pulmonary effort is normal. No respiratory distress.     Breath sounds: Normal breath sounds. No wheezing.  Abdominal:     General: Bowel sounds are normal.     Palpations: Abdomen is soft.  Musculoskeletal:     Cervical back: Neck supple.     Right lower leg: No edema.     Comments: Left BKA  Skin:    General: Skin is warm.     Capillary Refill: Capillary refill takes less than 2 seconds.     Findings: Erythema present.     Comments: Approx 1-2 cm raised, round, nodule, tender to touch, no skin breakdown or drainage, surrounding skin intact.   Neurological:     General: No focal deficit present.     Mental Status: He is alert. Mental status is at baseline.     Motor: Weakness present.     Gait: Gait abnormal.     Comments: wheelchair  Psychiatric:        Mood and Affect: Mood normal.        Behavior: Behavior normal.     Labs reviewed: Recent Labs    06/20/21 1120 07/03/21 0913 07/08/21 0000 07/18/21 0000 03/03/22 0000  NA 136 136 139 137 140  K 4.6 4.4 4.2 4.5 4.5  CL 99 102 102 100 104  CO2 27 26 29* 32* 31*  GLUCOSE 121* 96  --   --   --   BUN 32* 34* 35* 34* 38*  CREATININE 1.51* 1.49* 1.1 1.1 1.2  CALCIUM 9.1 9.0 8.5* 8.9 8.8  MG  --  2.2  --   --   --    Recent Labs    06/20/21 1120 07/03/21 0913 07/08/21 0000 03/03/22 0000  AST 12 14* 12* 12*  ALT 6* 6 3* 5*  ALKPHOS  --  48 45 61  BILITOT 0.6 0.5  --   --   PROT 6.9 6.9  --   --   ALBUMIN  --  3.4* 3.2* 3.5   Recent Labs    07/03/21 1618 07/04/21 0128 07/05/21 0302 07/05/21 0302 07/18/21 0000 07/25/21 0000 10/31/21 0000 03/03/22 0000  WBC 7.4 8.8 6.9   < > 5.5 6.0 6.3 5.2  NEUTROABS 6.6  --   --   --  3,471.00  --   --  2,938.00  HGB 9.5* 7.6* 8.3*  --  9.8* 10.3* 13.2* 12.5*   HCT 29.6* 24.0* 25.6*  --  30* 32* 41 38*  MCV 91.9 92.0 89.8  --   --   --   --   --   PLT 207 179 198  --  276 242 217 184   < > = values in this interval not displayed.   No results found for: "TSH" Lab Results  Component Value Date   HGBA1C 5.8 01/16/2022   No results found for: "CHOL", "HDL", "LDLCALC", "LDLDIRECT", "TRIG", "CHOLHDL"  Significant Diagnostic Results in last 30 days:  No results found.  Assessment/Plan 1. Cutaneous abscess of back excluding buttocks - Approx 1-2 cm round/raised/lesion to back - h/o skin infections - followed by dermatology> next f/u end of March - start doxycycline 100 mg po BID x 7 days - start Vitamin C 500 mg po daily for prevention - cont showering with Dial soap> consider something with chlorhexidine in future  2. Parkinson's disease, unspecified whether dyskinesia present, unspecified whether manifestations fluctuate - followed by neurology - cont carbidopa - cont skilled nursing care   Family/ staff Communication: plan discussed with patient and nurse  Labs/tests ordered:  none

## 2022-06-30 ENCOUNTER — Non-Acute Institutional Stay (SKILLED_NURSING_FACILITY): Payer: Medicare Other | Admitting: Orthopedic Surgery

## 2022-06-30 ENCOUNTER — Encounter: Payer: Self-pay | Admitting: Orthopedic Surgery

## 2022-06-30 DIAGNOSIS — Z89512 Acquired absence of left leg below knee: Secondary | ICD-10-CM

## 2022-06-30 DIAGNOSIS — G20A1 Parkinson's disease without dyskinesia, without mention of fluctuations: Secondary | ICD-10-CM

## 2022-06-30 DIAGNOSIS — R600 Localized edema: Secondary | ICD-10-CM | POA: Diagnosis not present

## 2022-06-30 DIAGNOSIS — N1831 Chronic kidney disease, stage 3a: Secondary | ICD-10-CM

## 2022-06-30 DIAGNOSIS — I10 Essential (primary) hypertension: Secondary | ICD-10-CM | POA: Diagnosis not present

## 2022-06-30 DIAGNOSIS — R7303 Prediabetes: Secondary | ICD-10-CM

## 2022-06-30 DIAGNOSIS — L02212 Cutaneous abscess of back [any part, except buttock]: Secondary | ICD-10-CM

## 2022-06-30 DIAGNOSIS — D500 Iron deficiency anemia secondary to blood loss (chronic): Secondary | ICD-10-CM

## 2022-06-30 NOTE — Progress Notes (Signed)
Location:   [facility] Room Number: 27-A Place of Service:  SNF [phone]) Provider:  Windell Moulding, NP  PCP: Virgie Dad, MD  Patient Care Team: Virgie Dad, MD as PCP - General (Internal Medicine) [name], Eustace Quail, DO as Consulting Physician (Neurology)  Extended Emergency Contact Information Primary Emergency Contact: Palecek,jane Address: [place].          apt [phone] Johnnette Litter of [place] Phone: [phone] Mobile Phone: [phone] Relation: Spouse Secondary Emergency Contact: Mian,kenneth Home Phone: [phone] Mobile Phone: [phone] Relation: Son  Code Status:  FULL CODE Goals of care: Advanced Directive information    06/30/2022   10:55 AM  Advanced Directives  Does Patient Have a Medical Advance Directive? Yes  Type of Paramedic of [place];Living will  Does patient want to make changes to medical advance directive? No - Patient declined  Copy of [place] in Chart? Yes - validated most recent copy scanned in chart (See row information)     Chief Complaint  Patient presents with   Medical Management of Chronic Issues    Routine Visit.    HPI:  Pt is a 89 y.o. male seen today for medical management of chronic diseases.    Parkinson's-  diagnosed 2019, followed by Dr. Carles Collet, recent BIMS 15/15 (02/06), remains on sinemet S/p left BKA- surgery 03/29 by Dr. Sharol Given due to ongoing osteomyelitis (+MRSA) of left heel, no phantom pain, uses prosthesis to ambulate/ wheelchair for long distances, no recent falls BLE- LV EF 60-65% 02/20/2021, improved since amputation, remains on torsemide 3x/week HTN- BUN/creat 38/1.2 03/03/2022, coreg discontinued due to hypotension CKD 3b- see above Prediabetes-  A1c 5.8 01/16/2022 Anemia- hgb 12.5 03/03/2022, remains on ferrous sulfate M/W/F  Recently evaluated by audiology, uses hearing aids, advised to use Debrox x 4 days.   Recent  blood pressures:  03/19- 136/70  03/12- 128/77  03/05- 132/78  Recent weights:  03/05- 185.9 lbs  02/03- 184.7 lbs  01/03- 180.9 lbs  06/26/2021- 187.2 lbs   Past Medical History:  Diagnosis Date   Acute bronchiolitis    Acute osteomyelitis of calcaneum, left ([place]) 01/11/2021   Anemia    Bursitis of both hips    Complication of anesthesia    wife states pt was a little "loopy" for several days   Dementia ([place])    due to Parkinson's   Hearing reduced    hearing aid   History of basal cell carcinoma    Hyperlipidemia    Malaise and fatigue    MRSA infection 01/11/2021   Osteoarthritis    hand   Osteoporosis    Palpitation    Parkinson's disease    Peripheral vascular disease ([place])    blood clots   Pre-diabetes    Vitamin D deficiency    Past Surgical History:  Procedure Laterality Date   ABDOMINAL AORTOGRAM W/LOWER EXTREMITY N/A 06/12/2021   Procedure: ABDOMINAL AORTOGRAM W/LOWER EXTREMITY;  Surgeon: Wellington Hampshire, MD;  Location: [facility];  Service: Cardiovascular;  Laterality: N/A;   AMPUTATION Left 07/03/2021   Procedure: LEFT BELOW KNEE AMPUTATION;  Surgeon: Newt Minion, MD;  Location: [facility];  Service: Orthopedics;  Laterality: Left;   CATARACT EXTRACTION  05/31/2015   Eye/right   COLONOSCOPY  02/05/2005, 03/06/2005   normal, 10 years ago   MOHS micrographic surgery     Face surgery   ORIF ACETABULAR FRACTURE Right 10/09/2020  Procedure: OPEN REDUCTION INTERNAL FIXATION (ORIF) ACETABULAR FRACTURE;  Surgeon: Altamese Dover, MD;  Location: [facility];  Service: Orthopedics;  Laterality: Right;    No Known Allergies  Allergies as of 06/30/2022   No Known Allergies      Medication List        Accurate as of June 30, 2022 10:55 AM. If you have any questions, ask your nurse or doctor.          acetaminophen 500 MG tablet Commonly known as: TYLENOL Take 1,000 mg by mouth 3 (three) times daily as needed for mild pain or moderate pain.    albuterol 108 (90 Base) MCG/ACT inhaler Commonly known as: VENTOLIN HFA Inhale 2 puffs into the lungs every 4 (four) hours as needed for wheezing or shortness of breath.   ascorbic acid 500 MG tablet Commonly known as: VITAMIN C Take 500 mg by mouth daily.   calcium carbonate 1500 (600 Ca) MG Tabs tablet Commonly known as: OSCAL Take 600 mg of elemental calcium by mouth daily with breakfast.   carbamide peroxide 6.5 % OTIC solution Commonly known as: DEBROX Place 5 drops into both ears daily at 12 noon.   carbidopa-levodopa 25-100 MG tablet Commonly known as: SINEMET IR Take 1 tablet by mouth in the morning, at noon, in the evening, and at bedtime. 06:00 AM, 12:00 PM, 06:00 PM, 12:00 AM   carbidopa-levodopa 50-200 MG tablet Commonly known as: SINEMET CR Take 1 tablet by mouth at bedtime.   cyanocobalamin 1000 MCG tablet Commonly known as: VITAMIN B12 Take 1,000 mcg by mouth daily.   feeding supplement (PRO-STAT SUGAR FREE 64) Liqd Take 30 mLs by mouth in the morning and at bedtime.   ferrous sulfate 325 (65 FE) MG tablet Take 325 mg by mouth every Monday, Wednesday, and Friday.   nystatin ointment Commonly known as: MYCOSTATIN Apply 1 Application topically every 12 (twelve) hours as needed.   polyethylene glycol 17 g packet Commonly known as: MIRALAX / GLYCOLAX Take 17 g by mouth every other day.   PRESERVISION AREDS 2 PO Take 1 capsule by mouth in the morning and at bedtime.   senna 8.6 MG tablet Commonly known as: SENOKOT Take 1 tablet by mouth daily.   torsemide 20 MG tablet Commonly known as: DEMADEX Take 20 mg by mouth 3 (three) times a week.   Vitamin D (Cholecalciferol) 25 MCG (1000 UT) Tabs Take 1,000 Units by mouth daily.   zinc oxide 20 % ointment Apply 1 application. topically See admin instructions. To buttocks after every incontinent episode and as needed for redness.        Review of Systems  Constitutional:  Negative for activity change  and appetite change.  HENT:  Positive for hearing loss. Negative for congestion and trouble swallowing.   Eyes:  Negative for visual disturbance.  Respiratory:  Negative for cough, shortness of breath and wheezing.   Cardiovascular:  Positive for leg swelling. Negative for chest pain.  Gastrointestinal:  Negative for abdominal distention and abdominal pain.  Genitourinary:  Negative for dysuria and frequency.  Musculoskeletal:  Positive for gait problem.  Skin:  Positive for wound.  Neurological:  Positive for tremors and weakness. Negative for dizziness and headaches.  Psychiatric/Behavioral:  Negative for confusion, dysphoric mood and sleep disturbance. The patient is not nervous/anxious.     Immunization History  Administered Date(s) Administered   COVID-19, mRNA, vaccine(Comirnaty)12 years and older 02/11/2022   Fluad Quad(high Dose 65+) 01/29/2022   H1N1 06/13/2008  Influenza Split 03/05/2006, 02/26/2007, 01/26/2008, 01/18/2009, 12/12/2009, 01/16/2011, 01/29/2012, 02/01/2013, 02/08/2014, 02/13/2015, 01/10/2016, 02/23/2017, 02/24/2018, 01/24/2019, 12/22/2019   Influenza, High Dose Seasonal PF 01/30/2021   Moderna SARS-COV2 Booster Vaccination 09/18/2021   PFIZER(Purple Top)SARS-COV-2 Vaccination 05/04/2019, 05/25/2019, 09/01/2020, 02/11/2022   Pfizer Covid-19 Vaccine Bivalent Booster 10yrs & up 12/27/2019, 12/26/2020   Pneumococcal Conjugate-13 02/10/2014   Pneumococcal Polysaccharide-23 06/10/2011   Zoster Recombinat (Shingrix) 07/04/2009, 03/24/2022   Pertinent  Health Maintenance Due  Topic Date Due   INFLUENZA VACCINE  Completed      07/05/2021    7:15 AM 01/30/2022    2:44 PM 03/11/2022    1:03 PM 04/18/2022    9:20 AM 06/04/2022    1:12 PM  Fall Risk  Falls in the past year?  0 0 0 0  Was there an injury with Fall?  0 0 0 0  Fall Risk Category Calculator  0 0 0 0  Fall Risk Category (Retired)  Low Low Low   (RETIRED) Patient Fall Risk Level High fall risk Low fall  risk Low fall risk Low fall risk   Patient at Risk for Falls Due to  History of fall(s)  No Fall Risks History of fall(s);Impaired balance/gait;Impaired mobility  Fall risk Follow up  Falls evaluation completed Falls evaluation completed Falls evaluation completed Falls evaluation completed;Education provided;Falls prevention discussed   Functional Status Survey:    Vitals:   06/30/22 1051  BP: 136/70  Pulse: 76  Resp: 20  SpO2: 96%  Weight: 185 lb 14.4 oz (84.3 kg)  Height: 5\' 10"  (1.778 m)   Body mass index is 26.67 kg/m. Physical Exam Vitals reviewed.  Constitutional:      General: He is not in acute distress. HENT:     Head: Normocephalic.  Eyes:     General:        Right eye: No discharge.        Left eye: No discharge.  Cardiovascular:     Rate and Rhythm: Normal rate and regular rhythm.     Pulses: Normal pulses.     Heart sounds: Murmur heard.  Pulmonary:     Effort: Pulmonary effort is normal. No respiratory distress.     Breath sounds: Normal breath sounds. No wheezing.  Abdominal:     General: Bowel sounds are normal. There is no distension.     Palpations: Abdomen is soft.     Tenderness: There is no abdominal tenderness.  Musculoskeletal:     Cervical back: Neck supple.     Right lower leg: No edema.     Comments: Left BKA  Skin:    General: Skin is warm and dry.     Findings: Lesion present.     Comments: < 1 cm open lesion to left flank, CDI, granulation tissue present, surrounding tissue intact.   Neurological:     General: No focal deficit present.     Mental Status: He is alert. Mental status is at baseline.     Motor: Weakness present.     Gait: Gait abnormal.     Comments: wheelchair  Psychiatric:        Mood and Affect: Mood normal.        Behavior: Behavior normal.     Labs reviewed: Recent Labs    07/03/21 0913 07/08/21 0000 07/18/21 0000 03/03/22 0000  NA 136 139 137 140  K 4.4 4.2 4.5 4.5  CL 102 102 100 104  CO2 26 29* 32*  31*  GLUCOSE 96  --   --   --  BUN 34* 35* 34* 38*  CREATININE 1.49* 1.1 1.1 1.2  CALCIUM 9.0 8.5* 8.9 8.8  MG 2.2  --   --   --    Recent Labs    07/03/21 0913 07/08/21 0000 03/03/22 0000  AST 14* 12* 12*  ALT 6 3* 5*  ALKPHOS 48 45 61  BILITOT 0.5  --   --   PROT 6.9  --   --   ALBUMIN 3.4* 3.2* 3.5   Recent Labs    07/03/21 1618 07/04/21 0128 07/05/21 0302 07/05/21 0302 07/18/21 0000 07/25/21 0000 10/31/21 0000 03/03/22 0000  WBC 7.4 8.8 6.9   < > 5.5 6.0 6.3 5.2  NEUTROABS 6.6  --   --   --  3,471.00  --   --  2,938.00  HGB 9.5* 7.6* 8.3*  --  9.8* 10.3* 13.2* 12.5*  HCT 29.6* 24.0* 25.6*  --  30* 32* 41 38*  MCV 91.9 92.0 89.8  --   --   --   --   --   PLT 207 179 198  --  276 242 217 184   < > = values in this interval not displayed.   No results found for: "TSH" Lab Results  Component Value Date   HGBA1C 5.8 01/16/2022   No results found for: "CHOL", "HDL", "LDLCALC", "LDLDIRECT", "TRIG", "CHOLHDL"  Significant Diagnostic Results in last 30 days:  No results found.  Assessment/Plan 1. Parkinson's disease, unspecified whether dyskinesia present, unspecified whether manifestations fluctuate - followed by Dr. Carles Collet - no memory issues - cont Sinemet  2. S/P below knee amputation, left ([facility]) - amputation 07/03/2021> + osteomyelitis left heel/ MRSA + - ambulates with left prosthesis/ supervision - no recent falls - cont skilled nursing  3. Bilateral leg edema - improved after left BKA - cont torsemide 3x/week  4. Primary hypertension - controlled without medication - on torsemide 3x/week  5. Stage 3a chronic kidney disease (HCC) - avoid nephrotoxic drugs like NSAIDS and dose adjust medications to be renally excreted - encourage hydration with water   6. Prediabetes - hgb stable  7. Blood loss anemia - hgb stable - cont ferrous sulfate 3x/week  8. Cutaneous abscess of back excluding buttocks - no sign of infection - leave open to air      Family/ staff Communication: plan discussed with patient, wife and nurse  Labs/tests ordered: none

## 2022-07-31 ENCOUNTER — Non-Acute Institutional Stay (SKILLED_NURSING_FACILITY): Payer: Medicare Other | Admitting: Internal Medicine

## 2022-07-31 ENCOUNTER — Encounter: Payer: Self-pay | Admitting: Internal Medicine

## 2022-07-31 DIAGNOSIS — R600 Localized edema: Secondary | ICD-10-CM | POA: Diagnosis not present

## 2022-07-31 DIAGNOSIS — N1831 Chronic kidney disease, stage 3a: Secondary | ICD-10-CM

## 2022-07-31 DIAGNOSIS — L989 Disorder of the skin and subcutaneous tissue, unspecified: Secondary | ICD-10-CM

## 2022-07-31 DIAGNOSIS — Z89512 Acquired absence of left leg below knee: Secondary | ICD-10-CM

## 2022-07-31 DIAGNOSIS — G20A1 Parkinson's disease without dyskinesia, without mention of fluctuations: Secondary | ICD-10-CM

## 2022-07-31 NOTE — Progress Notes (Signed)
Location:  [facility] Room Number: 27A Place of Service:  SNF [phone]) Provider:  [name], MD   [name], MD  Patient Care Team: [name], MD as PCP - General (Internal Medicine) [name], Octaviano Batty, DO as Consulting Physician (Neurology)  Extended Emergency Contact Information Primary Emergency Contact: Duvall,jane Address: [address] [place].          apt [phone] D[place] of [place] Home Phone: [phone] Mobile Phone: [phone] Relation: Spouse Secondary Emergency Contact: Eltzroth,kenneth Home Phone: [phone] Mobile Phone: [phone] Relation: Son  Code Status:  Full Code Goals of care: Advanced Directive information    07/31/2022    2:46 PM  Advanced Directives  Does Patient Have a Medical Advance Directive? Yes  Type of Estate agent of [place];Living will  Does patient want to make changes to medical advance directive? No - Patient declined  Copy of Healthcare Power of Attorney in Chart? Yes - validated most recent copy scanned in chart (See row information)     Chief Complaint  Patient presents with   Medical Management of Chronic Issues    Routine visit     HPI:  Pt is a 89 y.o. male seen today for medical management of chronic diseases.   Lives in SNF   Patient is s/p Left BKA for Left Osteomyelitis    h/o Parkinson disease Follows with Dr [name] Diagnosed in 2019 in [place]   h/o Osteoporosis , Vit D def Has been on Fosamax before, BPH, Arthritis,   right femur fracture followed by acute DVT of right leg.     He is stable. No new Nursing issues. 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   Hearing reduced    hearing aid   History of basal cell carcinoma    Hyperlipidemia    Malaise and fatigue    MRSA infection 01/11/2021   Osteoarthritis    hand   Osteoporosis    Palpitation    Parkinson's disease    Peripheral vascular disease    blood clots   Pre-diabetes    Vitamin D deficiency    Past Surgical History:  Procedure Laterality Date   ABDOMINAL AORTOGRAM W/LOWER EXTREMITY N/A 06/12/2021   Procedure: ABDOMINAL AORTOGRAM W/LOWER EXTREMITY;  Surgeon: Iran Ouch, MD;  Location: [facility] INVASIVE CV LAB;  Service: Cardiovascular;  Laterality: N/A;   AMPUTATION Left 07/03/2021   Procedure: LEFT BELOW KNEE AMPUTATION;  Surgeon: Nadara Mustard, MD;  Location: [facility] OR;  Service: Orthopedics;  Laterality: Left;   CATARACT EXTRACTION  05/31/2015   Eye/right   COLONOSCOPY  02/05/2005, 03/06/2005   normal, 10 years ago   MOHS micrographic surgery     Face surgery   ORIF ACETABULAR FRACTURE Right 10/09/2020   Procedure: OPEN REDUCTION INTERNAL FIXATION (ORIF) ACETABULAR FRACTURE;  Surgeon: [name], MD;  Location: [facility] OR;  Service: Orthopedics;  Laterality: Right;    No Known Allergies  Outpatient Encounter Medications as of 07/31/2022  Medication Sig   acetaminophen (TYLENOL) 500 MG tablet Take 1,000  mg by mouth 3 (three) times daily as needed for mild pain or moderate pain.   albuterol (VENTOLIN HFA) 108 (90 Base) MCG/ACT inhaler Inhale 2 puffs into the lungs every 4 (four) hours as needed for wheezing or shortness of breath.   Amino Acids-Protein Hydrolys (FEEDING SUPPLEMENT, PRO-STAT SUGAR FREE 64,) LIQD Take 30 mLs by mouth in the morning and at bedtime.   ascorbic acid (VITAMIN C) 500 MG tablet Take 500 mg by mouth daily.   calcium carbonate (OSCAL) 1500 (600 Ca) MG TABS tablet Take 600 mg of elemental calcium by mouth daily with breakfast.   carbamide peroxide (DEBROX) 6.5 % OTIC solution Place 5  drops into both ears daily at 12 noon.   carbidopa-levodopa (SINEMET CR) 50-200 MG tablet Take 1 tablet by mouth at bedtime.   carbidopa-levodopa (SINEMET IR) 25-100 MG tablet Take 1 tablet by mouth in the morning, at noon, in the evening, and at bedtime. 06:00 AM, 12:00 PM, 06:00 PM, 12:00 AM   ferrous sulfate 325 (65 FE) MG tablet Take 325 mg by mouth every Monday, Wednesday, and Friday.   hydroxypropyl methylcellulose / hypromellose (ISOPTO TEARS / GONIOVISC) 2.5 % ophthalmic solution Place 1 drop into both eyes in the morning and at bedtime.   Multiple Vitamins-Minerals (PRESERVISION AREDS 2 PO) Take 1 capsule by mouth in the morning and at bedtime.   nystatin ointment (MYCOSTATIN) Apply 1 Application topically every 12 (twelve) hours as needed.   polyethylene glycol (MIRALAX / GLYCOLAX) 17 g packet Take 17 g by mouth every other day.   senna (SENOKOT) 8.6 MG tablet Take 1 tablet by mouth daily.   torsemide (DEMADEX) 20 MG tablet Take 20 mg by mouth 3 (three) times a week.   vitamin B-12 (CYANOCOBALAMIN) 1000 MCG tablet Take 1,000 mcg by mouth daily.   Vitamin D, Cholecalciferol, 25 MCG (1000 UT) TABS Take 1,000 Units by mouth daily.   zinc oxide 20 % ointment Apply 1 application. topically See admin instructions. To buttocks after every incontinent episode and as needed for redness.   No facility-administered encounter medications on file as of 07/31/2022.    Review of Systems  Constitutional:  Negative for activity change, appetite change and unexpected weight change.  HENT: Negative.    Respiratory:  Negative for cough and shortness of breath.   Cardiovascular:  Negative for leg swelling.  Gastrointestinal:  Negative for constipation.  Genitourinary:  Negative for frequency.  Musculoskeletal:  Positive for gait problem. Negative for arthralgias and myalgias.  Skin: Negative.  Negative for rash.  Neurological:  Negative for dizziness and weakness.  Psychiatric/Behavioral:  Negative  for confusion and sleep disturbance.   All other systems reviewed and are negative.   Immunization History  Administered Date(s) Administered   COVID-19, mRNA, vaccine(Comirnaty)12 years and older 02/11/2022   Fluad Quad(high Dose 65+) 01/29/2022   H1N1 06/13/2008   Influenza Split 03/05/2006, 02/26/2007, 01/26/2008, 01/18/2009, 12/12/2009, 01/16/2011, 01/29/2012, 02/01/2013, 02/08/2014, 02/13/2015, 01/10/2016, 02/23/2017, 02/24/2018, 01/24/2019, 12/22/2019   Influenza, High Dose Seasonal PF 01/30/2021   Moderna SARS-COV2 Booster Vaccination 09/18/2021   PFIZER(Purple Top)SARS-COV-2 Vaccination 05/04/2019, 05/25/2019, 09/01/2020, 02/11/2022   Pfizer Covid-19 Vaccine Bivalent Booster 80yrs & up 12/27/2019, 12/26/2020   Pneumococcal Conjugate-13 02/10/2014   Pneumococcal Polysaccharide-23 06/10/2011   Tdap 06/05/2022   Zoster Recombinat (Shingrix) 07/04/2009, 03/24/2022   Pertinent  Health Maintenance Due  Topic Date Due   INFLUENZA VACCINE  11/06/2022      03/11/2022    1:03 PM 04/18/2022    9:20  AM 06/04/2022    1:12 PM 06/30/2022   11:40 AM 07/31/2022    2:46 PM  Fall Risk  Falls in the past year? 0 0 0 0 0  Was there an injury with Fall? 0 0 0 0 0  Fall Risk Category Calculator 0 0 0 0 0  Fall Risk Category (Retired) Low Low     (RETIRED) Patient Fall Risk Level Low fall risk Low fall risk     Patient at Risk for Falls Due to  No Fall Risks History of fall(s);Impaired balance/gait;Impaired mobility History of fall(s);Impaired balance/gait;Impaired mobility No Fall Risks  Fall risk Follow up Falls evaluation completed Falls evaluation completed Falls evaluation completed;Education provided;Falls prevention discussed Falls evaluation completed;Education provided;Falls prevention discussed Falls evaluation completed   Functional Status Survey:    Vitals:   07/31/22 1442  BP: 119/69  Pulse: 74  Resp: 18  Temp: (!) 97.2 F (36.2 C)  TempSrc: Temporal  SpO2: 93%  Weight: 186  lb 9.6 oz (84.6 kg)  Height: 5\' 10"  (1.778 m)   Body mass index is 26.77 kg/m. Physical Exam Vitals reviewed.  Constitutional:      Appearance: Normal appearance.  HENT:     Head: Normocephalic.     Nose: Nose normal.     Mouth/Throat:     Mouth: Mucous membranes are moist.     Pharynx: Oropharynx is clear.  Eyes:     Pupils: Pupils are equal, round, and reactive to light.  Cardiovascular:     Rate and Rhythm: Normal rate and regular rhythm.     Pulses: Normal pulses.     Heart sounds: Murmur heard.  Pulmonary:     Effort: Pulmonary effort is normal. No respiratory distress.     Breath sounds: Normal breath sounds. No rales.  Abdominal:     General: Abdomen is flat. Bowel sounds are normal.     Palpations: Abdomen is soft.  Musculoskeletal:        General: No swelling.     Cervical back: Neck supple.  Skin:    General: Skin is warm.  Neurological:     Mental Status: He is alert and oriented to person, place, and time.  Psychiatric:        Mood and Affect: Mood normal.        Thought Content: Thought content normal.     Labs reviewed: Recent Labs    03/03/22 0000  NA 140  K 4.5  CL 104  CO2 31*  BUN 38*  CREATININE 1.2  CALCIUM 8.8   Recent Labs    03/03/22 0000  AST 12*  ALT 5*  ALKPHOS 61  ALBUMIN 3.5   Recent Labs    10/31/21 0000 03/03/22 0000  WBC 6.3 5.2  NEUTROABS  --  2,938.00  HGB 13.2* 12.5*  HCT 41 38*  PLT 217 184   No results found for: "TSH" Lab Results  Component Value Date   HGBA1C 5.8 01/16/2022   No results found for: "CHOL", "HDL", "LDLCALC", "LDLDIRECT", "TRIG", "CHOLHDL"  Significant Diagnostic Results in last 30 days:  No results found.  Assessment/Plan 1. Parkinson's disease, unspecified whether dyskinesia present, unspecified whether manifestations fluctuate Sinemet Follows with Dr [name]  2. S/P below knee amputation, left (HCC) Has prosthesis Mostly Wheelchair bound  3. Bilateral leg edema Torsemide  4.  Stage 3a chronic kidney disease (HCC) Creat stable  5. Skin lesion of scalp Following with Derm    Family/ staff Communication:   Labs/tests ordered:

## 2022-08-16 IMAGING — XA IR PICC >5YO
1 series · 2 of 2 positions shown · IV contrast (agent unspecified)
Comparison: none

INDICATION: Calcaneal osteomyelitis, in need of long-term IV antibiotic
treatment. Request for PICC line placement.

EXAM:
ULTRASOUND AND FLUOROSCOPIC GUIDED PICC LINE INSERTION
MEDICATIONS:
1% lidocaine
CONTRAST:  None
FLUOROSCOPY TIME:  24 seconds (1 mGy)
COMPLICATIONS:
None immediate.
TECHNIQUE: The procedure, risks, benefits, and alternatives were explained to
the patient and informed written consent was obtained.

[Series 1: fl (-) angio · 2 of 2 slices shown]
[im 1/2]
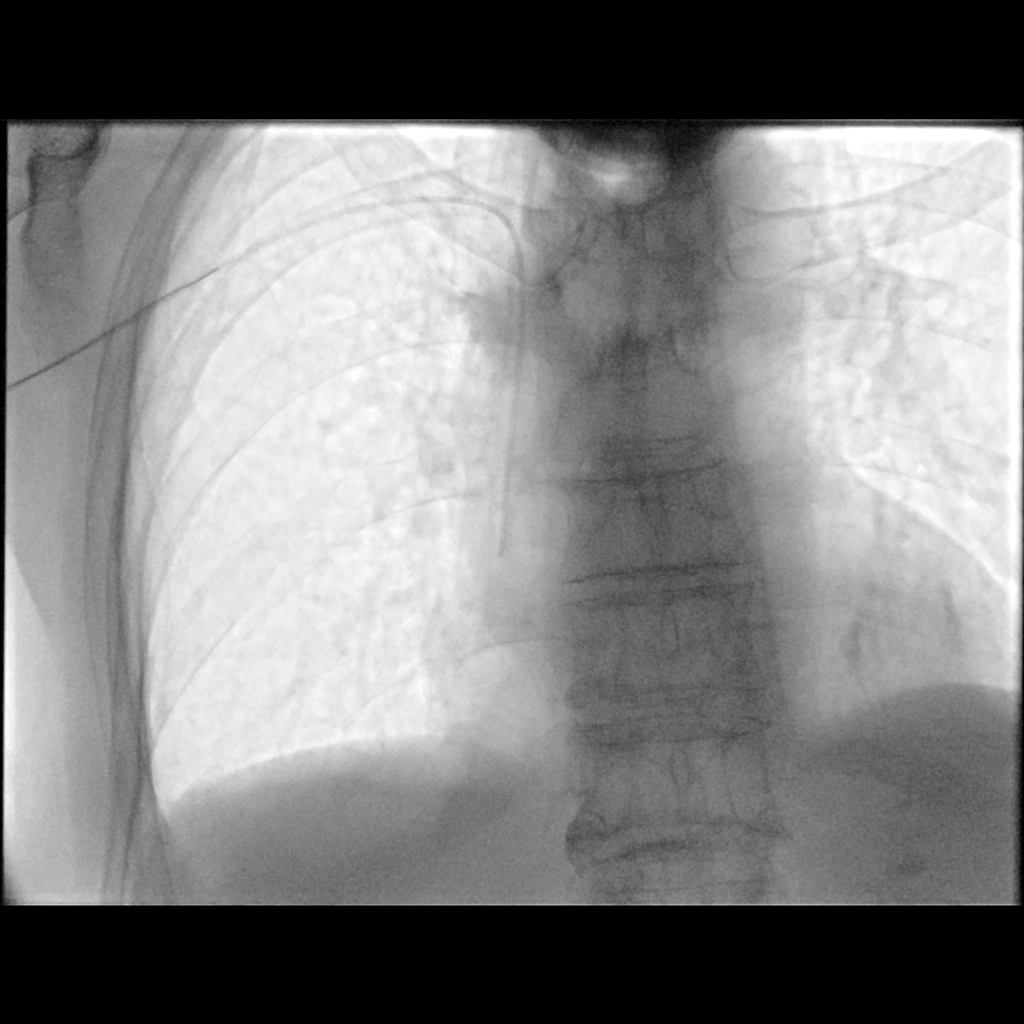
[im 2/2]
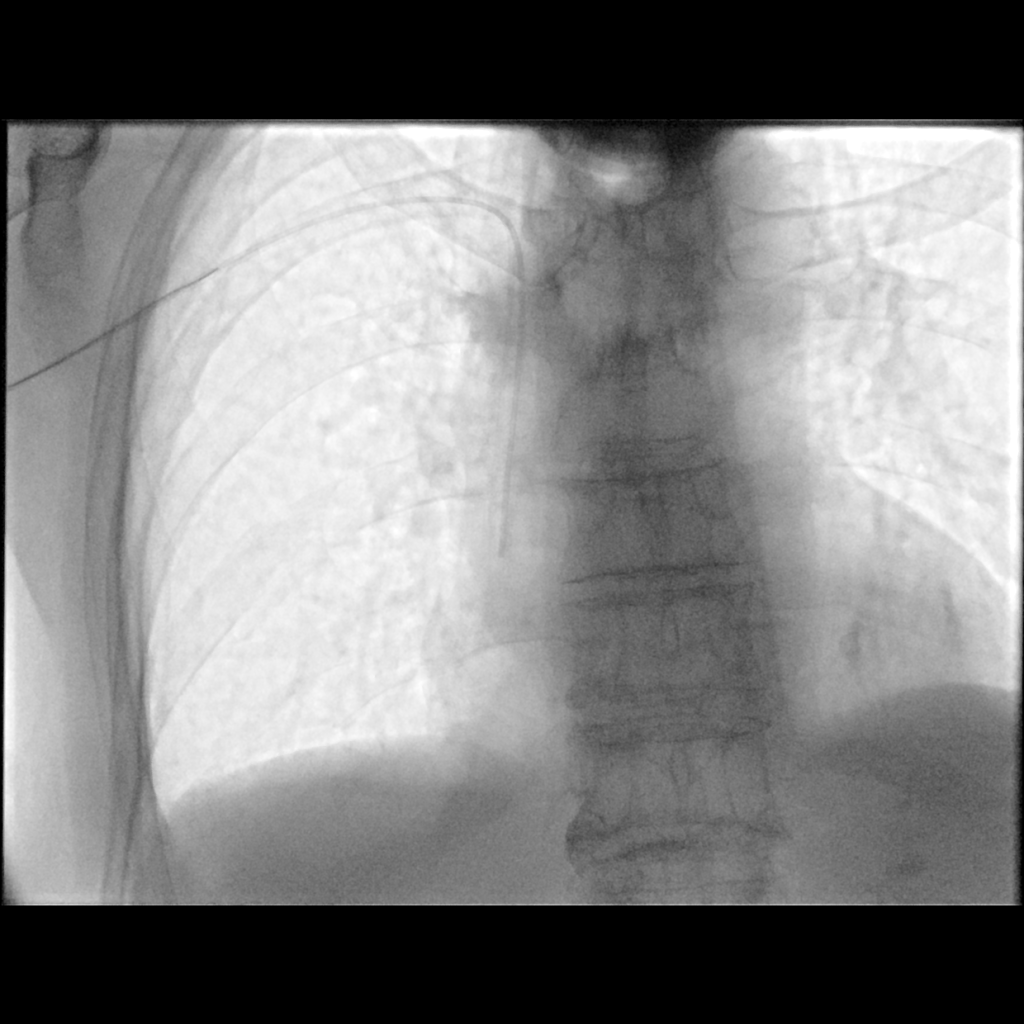

[2 of 2 positions shown; findings below may reference images not displayed]

The right upper extremity was prepped with chlorhexidine in a
sterile fashion, and a sterile drape was applied covering the
operative field. Maximum barrier sterile technique with sterile
gowns and gloves were used for the procedure. A timeout was
performed prior to the initiation of the procedure. Local anesthesia
was provided with 1% lidocaine.

After the overlying soft tissues were anesthetized with 1%
lidocaine, a micropuncture kit was utilized to access the right
basilic vein. Real-time ultrasound guidance was utilized for
vascular access including the acquisition of a permanent ultrasound
image documenting patency of the accessed vessel.

A guidewire was advanced to the level of the superior caval-atrial
junction for measurement purposes and the PICC line was cut to
length. A peel-away sheath was placed and a 43 cm, 5 French, single
lumen was inserted to level of the superior caval-atrial junction. A
post procedure spot fluoroscopic was obtained. The catheter easily
aspirated and flushed and was secured in place. A dressing was
placed. The patient tolerated the procedure well without immediate
post procedural complication.
FINDINGS: After catheter placement, the tip lies within the superior
cavoatrial junction. The catheter aspirates and flushes normally and
is ready for immediate use.
IMPRESSION: Successful ultrasound and fluoroscopic guided placement of a right
basilic vein approach, 43 cm, 5 French, single lumen PICC with tip
at the superior caval-atrial junction. The PICC line is ready for
immediate use.

## 2022-08-28 ENCOUNTER — Non-Acute Institutional Stay (SKILLED_NURSING_FACILITY): Payer: Medicare Other | Admitting: Orthopedic Surgery

## 2022-08-28 ENCOUNTER — Encounter: Payer: Self-pay | Admitting: Orthopedic Surgery

## 2022-08-28 DIAGNOSIS — G20A1 Parkinson's disease without dyskinesia, without mention of fluctuations: Secondary | ICD-10-CM | POA: Diagnosis not present

## 2022-08-28 DIAGNOSIS — I10 Essential (primary) hypertension: Secondary | ICD-10-CM

## 2022-08-28 DIAGNOSIS — R7303 Prediabetes: Secondary | ICD-10-CM

## 2022-08-28 DIAGNOSIS — R600 Localized edema: Secondary | ICD-10-CM | POA: Diagnosis not present

## 2022-08-28 DIAGNOSIS — R051 Acute cough: Secondary | ICD-10-CM

## 2022-08-28 DIAGNOSIS — N1831 Chronic kidney disease, stage 3a: Secondary | ICD-10-CM

## 2022-08-28 DIAGNOSIS — Z89512 Acquired absence of left leg below knee: Secondary | ICD-10-CM

## 2022-08-28 DIAGNOSIS — D500 Iron deficiency anemia secondary to blood loss (chronic): Secondary | ICD-10-CM

## 2022-08-28 NOTE — Progress Notes (Signed)
Location:   [facility]  Nursing Home Room Number: 27-A Place of Service:  SNF [phone]) Provider:  Hazle Nordmann, NP  PCP: [name], MD  Patient Care Team: [name], MD as PCP - General (Internal Medicine) [name], Octaviano Batty, DO as Consulting Physician (Neurology)  Extended Emergency Contact Information Primary Emergency Contact: Massoud,jane Address: [address] [place].          apt [phone] [place] of [place] Home Phone: [phone] Mobile Phone: [phone] Relation: Spouse Secondary Emergency Contact: Viloria,kenneth Home Phone: [phone] Mobile Phone: [phone] Relation: Son  Code Status:  FULL CODE Goals of care: Advanced Directive information    08/28/2022   10:17 AM  Advanced Directives  Does Patient Have a Medical Advance Directive? Yes  Type of Estate agent of [place];Living will  Does patient want to make changes to medical advance directive? No - Patient declined  Copy of Healthcare Power of Attorney in Chart? Yes - validated most recent copy scanned in chart (See row information)     Chief Complaint  Patient presents with   Medical Management of Chronic Issues    Routine Visit.     HPI:  Pt is a 89 y.o. male seen today for medical management of chronic diseases.    Expand All Collapse All  Location:   [facility] Room Number: 27-A Place of Service:  SNF [phone]) Provider:  Hazle Nordmann, NP   PCP: [name], MD   Patient Care Team: [name], MD as PCP - General (Internal Medicine) [name], Octaviano Batty, DO as Consulting Physician (Neurology)   Extended Emergency Contact Information Primary Emergency Contact: Arseneau,jane Address: [address] [place].          apt [phone] [place] of [place] Home Phone: [phone] Mobile Phone: [phone] Relation: Spouse Secondary Emergency Contact: [place],kenneth Home Phone: [phone] Mobile Phone:  [phone] Relation: Son   Code Status:  FULL CODE Goals of care: Advanced Directive information     06/30/2022   10:55 AM  Advanced Directives  Does Patient Have a Medical Advance Directive? Yes  Type of Estate agent of [place];Living will  Does patient want to make changes to medical advance directive? No - Patient declined  Copy of Healthcare Power of Attorney in Chart? Yes - validated most recent copy scanned in chart (See row information)            Chief Complaint  Patient presents with   Medical Management of Chronic Issues      Routine Visit.      HPI:  Pt is a 89 y.o. male seen today for medical management of chronic diseases.     Acute cough- dry cough sometimes in evening after eating, denies choking or reflux, does not want workup at this time Parkinson's-  diagnosed 2019, followed by Dr. 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 Lajoyce Corners due to ongoing osteomyelitis (+MRSA) of left heel, no phantom pain, uses prosthesis to ambulate/ wheelchair for long distances, appointment with [facility] next week for possible adjustment, no recent falls RLE- LV EF 60-65% 02/20/2021, improved since amputation, compression hose to right leg daily, torsemide 3x/week HTN- BUN/creat 38/1.2 03/03/2022, coreg discontinued due to hypotension, see trends below CKD 3b- see above Prediabetes-  A1c 5.8 01/16/2022 Anemia- hgb 12.5 03/03/2022, remains on ferrous sulfate M/W/F   No recent falls or injuries.  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   Hearing reduced    hearing aid   History of basal cell carcinoma    Hyperlipidemia    Malaise and fatigue    MRSA infection 01/11/2021   Osteoarthritis    hand   Osteoporosis    Palpitation    Parkinson's disease    Peripheral vascular disease (HCC)    blood clots   Pre-diabetes    Vitamin D deficiency    Past Surgical History:  Procedure Laterality Date   ABDOMINAL AORTOGRAM W/LOWER EXTREMITY N/A 06/12/2021   Procedure: ABDOMINAL AORTOGRAM W/LOWER EXTREMITY;  Surgeon: Iran Ouch, MD;  Location: [facility] INVASIVE CV LAB;  Service: Cardiovascular;  Laterality: N/A;   AMPUTATION Left 07/03/2021   Procedure: LEFT BELOW KNEE AMPUTATION;  Surgeon: Nadara Mustard, MD;  Location: [facility] OR;  Service: Orthopedics;  Laterality: Left;   CATARACT EXTRACTION  05/31/2015   Eye/right   COLONOSCOPY  02/05/2005, 03/06/2005   normal, 10 years ago   MOHS micrographic surgery     Face surgery   ORIF ACETABULAR FRACTURE Right 10/09/2020   Procedure: OPEN REDUCTION INTERNAL FIXATION (ORIF) ACETABULAR FRACTURE;  Surgeon: [name], MD;  Location: [facility] OR;  Service: Orthopedics;  Laterality: Right;    No Known Allergies  Allergies as of 08/28/2022   No Known Allergies      Medication List        Accurate as of Aug 28, 2022 10:17 AM. If you have any questions, ask your nurse or doctor.          STOP taking these medications    carbamide peroxide 6.5 % OTIC solution Commonly known as: DEBROX Stopped by: Octavia Heir, NP   hydroxypropyl methylcellulose / hypromellose 2.5 % ophthalmic solution Commonly known as: ISOPTO TEARS / GONIOVISC Stopped by: Octavia Heir, NP       TAKE these medications    acetaminophen 500 MG tablet Commonly known as: TYLENOL Take 1,000 mg by mouth 3 (three) times daily as needed for mild pain or moderate pain.   albuterol 108 (90 Base) MCG/ACT inhaler Commonly known as: VENTOLIN HFA Inhale 2 puffs into the lungs every 4  (four) hours as needed for wheezing or shortness of breath.   ARTIFICIAL TEARS OP Place 1 drop into both eyes in the morning and at bedtime.   ascorbic acid 500 MG tablet Commonly known as: VITAMIN C Take 500 mg by mouth daily.   calcium carbonate 1500 (600 Ca) MG Tabs tablet Commonly known as: OSCAL Take 600 mg of elemental calcium by mouth daily with breakfast.   carbidopa-levodopa 25-100 MG tablet Commonly known as: SINEMET IR Take 1 tablet by mouth in the morning, at noon, in the evening, and at bedtime. 06:00 AM, 12:00 PM, 06:00 PM, 12:00 AM   carbidopa-levodopa 50-200 MG tablet Commonly known as: SINEMET CR Take 1 tablet by mouth at bedtime.   cyanocobalamin 1000 MCG tablet Commonly known as: VITAMIN B12 Take 1,000 mcg by mouth daily.  feeding supplement (PRO-STAT SUGAR FREE 64) Liqd Take 30 mLs by mouth in the morning and at bedtime.   ferrous sulfate 325 (65 FE) MG tablet Take 325 mg by mouth every Monday, Wednesday, and Friday.   nystatin ointment Commonly known as: MYCOSTATIN Apply 1 Application topically every 12 (twelve) hours as needed.   polyethylene glycol 17 g packet Commonly known as: MIRALAX / GLYCOLAX Take 17 g by mouth every other day.   PRESERVISION AREDS 2 PO Take 1 capsule by mouth in the morning and at bedtime.   senna 8.6 MG tablet Commonly known as: SENOKOT Take 1 tablet by mouth daily.   torsemide 20 MG tablet Commonly known as: DEMADEX Take 20 mg by mouth 3 (three) times a week.   Vitamin D (Cholecalciferol) 25 MCG (1000 UT) Tabs Take 1,000 Units by mouth daily.   zinc oxide 20 % ointment Apply 1 application. topically See admin instructions. To buttocks after every incontinent episode and as needed for redness.        Review of Systems  Constitutional:  Negative for activity change and appetite change.  HENT:  Negative for sore throat and trouble swallowing.   Eyes:  Negative for visual disturbance.  Respiratory:  Positive  for cough. Negative for shortness of breath and wheezing.   Cardiovascular:  Positive for leg swelling. Negative for chest pain.  Gastrointestinal:  Negative for abdominal distention and abdominal pain.  Genitourinary:  Negative for dysuria, frequency and hematuria.  Musculoskeletal:  Positive for gait problem.  Skin:  Negative for wound.  Neurological:  Positive for weakness. Negative for dizziness and headaches.  Psychiatric/Behavioral:  Negative for confusion, dysphoric mood and sleep disturbance. The patient is not nervous/anxious.     Immunization History  Administered Date(s) Administered   COVID-19, mRNA, vaccine(Comirnaty)12 years and older 02/11/2022   Fluad Quad(high Dose 65+) 01/29/2022   H1N1 06/13/2008   Influenza Split 03/05/2006, 02/26/2007, 01/26/2008, 01/18/2009, 12/12/2009, 01/16/2011, 01/29/2012, 02/01/2013, 02/08/2014, 02/13/2015, 01/10/2016, 02/23/2017, 02/24/2018, 01/24/2019, 12/22/2019   Influenza, High Dose Seasonal PF 01/30/2021   Moderna SARS-COV2 Booster Vaccination 09/18/2021   PFIZER(Purple Top)SARS-COV-2 Vaccination 05/04/2019, 05/25/2019, 09/01/2020, 02/11/2022   Pfizer Covid-19 Vaccine Bivalent Booster 59yrs & up 12/27/2019, 12/26/2020   Pneumococcal Conjugate-13 02/10/2014   Pneumococcal Polysaccharide-23 06/10/2011   Tdap 06/05/2022   Zoster Recombinat (Shingrix) 07/04/2009, 03/24/2022   Pertinent  Health Maintenance Due  Topic Date Due   INFLUENZA VACCINE  11/06/2022      03/11/2022    1:03 PM 04/18/2022    9:20 AM 06/04/2022    1:12 PM 06/30/2022   11:40 AM 07/31/2022    2:46 PM  Fall Risk  Falls in the past year? 0 0 0 0 0  Was there an injury with Fall? 0 0 0 0 0  Fall Risk Category Calculator 0 0 0 0 0  Fall Risk Category (Retired) Low Low     (RETIRED) Patient Fall Risk Level Low fall risk Low fall risk     Patient at Risk for Falls Due to  No Fall Risks History of fall(s);Impaired balance/gait;Impaired mobility History of fall(s);Impaired  balance/gait;Impaired mobility No Fall Risks  Fall risk Follow up Falls evaluation completed Falls evaluation completed Falls evaluation completed;Education provided;Falls prevention discussed Falls evaluation completed;Education provided;Falls prevention discussed Falls evaluation completed   Functional Status Survey:    Vitals:   08/28/22 1012  BP: (!) 108/56  Pulse: 72  Resp: 18  Temp: 98.2 F (36.8 C)  SpO2: 92%  Weight: 188 lb 1.6  oz (85.3 kg)  Height: 5\' 10"  (1.778 m)   Body mass index is 26.99 kg/m. Physical Exam Vitals reviewed.  Constitutional:      General: He is not in acute distress. HENT:     Head: Normocephalic.  Eyes:     General:        Right eye: No discharge.        Left eye: No discharge.  Cardiovascular:     Rate and Rhythm: Normal rate and regular rhythm.     Pulses: Normal pulses.     Heart sounds: Murmur heard.  Pulmonary:     Effort: Pulmonary effort is normal. No respiratory distress.     Breath sounds: Normal breath sounds. No wheezing, rhonchi or rales.  Abdominal:     General: Bowel sounds are normal.     Palpations: Abdomen is soft.  Musculoskeletal:     Cervical back: Neck supple.     Right lower leg: Edema present.     Left lower leg: No edema.     Comments: Non pitting, left BKA  Skin:    General: Skin is warm.     Capillary Refill: Capillary refill takes less than 2 seconds.  Neurological:     General: No focal deficit present.     Mental Status: He is alert and oriented to person, place, and time.     Motor: Weakness present.     Gait: Gait abnormal.     Comments: Wheelchair, walker with assist  Psychiatric:        Mood and Affect: Mood normal.     Labs reviewed: Recent Labs    03/03/22 0000  NA 140  K 4.5  CL 104  CO2 31*  BUN 38*  CREATININE 1.2  CALCIUM 8.8   Recent Labs    03/03/22 0000  AST 12*  ALT 5*  ALKPHOS 61  ALBUMIN 3.5   Recent Labs    10/31/21 0000 03/03/22 0000  WBC 6.3 5.2  NEUTROABS   --  2,938.00  HGB 13.2* 12.5*  HCT 41 38*  PLT 217 184   No results found for: "TSH" Lab Results  Component Value Date   HGBA1C 5.8 01/16/2022   No results found for: "CHOL", "HDL", "LDLCALC", "LDLDIRECT", "TRIG", "CHOLHDL"  Significant Diagnostic Results in last 30 days:  No results found.  Assessment/Plan 1. Acute cough - intermittent dry cough in evening after eating - lung sounds clear - recommend ST evaluation or trial of omeprazole> refused at this time  2. Parkinson's disease, unspecified whether dyskinesia present, unspecified whether manifestations fluctuate - followed by Dr. Arbutus Leas - sleeping more per wife - cont sinemet - cont skilled nursing  3. S/P below knee amputation, left (HCC) - -amputation 07/03/2021> + osteomyelitis left heel/ MRSA + - ambulates with left prosthesis/ supervision - f/u with [facility] next week  4. Edema of right lower leg - ongoing - non pitting - cont compression hose - will reduce torsemide to 2x/week due to lowering bp  5. Primary hypertension - controlled without medication - bp trending low - will reduce torsemide to 2x/week  6. Stage 3a chronic kidney disease (HCC) - encourage hydration with water - avoid NSAIDS  7. Prediabetes - A1c 5.8 - recheck A1c  8. Blood loss anemia - cont ferrous sulfate    Family/ staff Communication: plan discussed with patient and nurse  Labs/tests ordered:  cbc/diff, cmp, A1c, TSH  09/04/2022

## 2022-09-05 LAB — BASIC METABOLIC PANEL
BUN: 34 — AB (ref 4–21)
CO2: 28 — AB (ref 13–22)
Chloride: 103 (ref 99–108)
Creatinine: 1 (ref 0.6–1.3)
Glucose: 83
Potassium: 4.3 mEq/L (ref 3.5–5.1)
Sodium: 138 (ref 137–147)

## 2022-09-05 LAB — TSH: TSH: 3.07 (ref 0.41–5.90)

## 2022-09-05 LAB — HEPATIC FUNCTION PANEL
ALT: 7 U/L — AB (ref 10–40)
AST: 13 — AB (ref 14–40)
Alkaline Phosphatase: 73 (ref 25–125)
Bilirubin, Total: 0.8

## 2022-09-05 LAB — CBC AND DIFFERENTIAL
HCT: 40 — AB (ref 41–53)
Hemoglobin: 13.4 — AB (ref 13.5–17.5)
Platelets: 176 10*3/uL (ref 150–400)
WBC: 6

## 2022-09-05 LAB — COMPREHENSIVE METABOLIC PANEL
Albumin: 3.8 (ref 3.5–5.0)
Calcium: 8.9 (ref 8.7–10.7)
Globulin: 2.5

## 2022-09-05 LAB — HEMOGLOBIN A1C: Hemoglobin A1C: 6

## 2022-09-05 LAB — CBC: RBC: 4.21 (ref 3.87–5.11)

## 2022-10-03 ENCOUNTER — Non-Acute Institutional Stay (SKILLED_NURSING_FACILITY): Payer: Medicare Other | Admitting: Orthopedic Surgery

## 2022-10-03 ENCOUNTER — Encounter: Payer: Self-pay | Admitting: Orthopedic Surgery

## 2022-10-03 DIAGNOSIS — I10 Essential (primary) hypertension: Secondary | ICD-10-CM | POA: Diagnosis not present

## 2022-10-03 DIAGNOSIS — D508 Other iron deficiency anemias: Secondary | ICD-10-CM | POA: Diagnosis not present

## 2022-10-03 DIAGNOSIS — G20A1 Parkinson's disease without dyskinesia, without mention of fluctuations: Secondary | ICD-10-CM

## 2022-10-03 DIAGNOSIS — N1832 Chronic kidney disease, stage 3b: Secondary | ICD-10-CM

## 2022-10-03 DIAGNOSIS — S88112S Complete traumatic amputation at level between knee and ankle, left lower leg, sequela: Secondary | ICD-10-CM

## 2022-10-03 DIAGNOSIS — R7303 Prediabetes: Secondary | ICD-10-CM

## 2022-10-03 DIAGNOSIS — R600 Localized edema: Secondary | ICD-10-CM

## 2022-10-03 NOTE — Progress Notes (Signed)
Location:   [facility]  Nursing Home Room Number: 27-A Place of Service:  SNF [phone]) Provider:  Hazle Nordmann, NP  PCP: [name], MD  Patient Care Team: [name], MD as PCP - General (Internal Medicine) [name], Octaviano Batty, DO as Consulting Physician (Neurology)  Extended Emergency Contact Information Primary Emergency Contact: Dimare,jane Address: [address] [place].          apt [phone] [place] of [place] Home Phone: [phone] Mobile Phone: [phone] Relation: Spouse Secondary Emergency Contact: Bolender,kenneth Home Phone: [phone] Mobile Phone: [phone] Relation: Son  Code Status: FULL CODE  Goals of care: Advanced Directive information    10/03/2022   10:08 AM  Advanced Directives  Does Patient Have a Medical Advance Directive? Yes  Type of Estate agent of [place];Living will  Does patient want to make changes to medical advance directive? No - Patient declined  Copy of Healthcare Power of Attorney in Chart? Yes - validated most recent copy scanned in chart (See row information)     Chief Complaint  Patient presents with   Medical Management of Chronic Issues    Routine Visit.    HPI:  Pt is a 89 y.o. male seen today for medical management of chronic diseases.    HTN- BUN/creat BUN/creat 34/1.0 09/05/2022, coreg discontinued due to hypotension, see trends below CKD 3b- see above Prediabetes-  A1c 6.0 (05/31)> was 5.8 01/16/2022 Iron deficiency anemia- hgb 13.4 09/05/2022, remains on ferrous sulfate M/W/F Parkinson's-  diagnosed 2019, followed by Dr. 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 Lajoyce Corners due to ongoing osteomyelitis (+MRSA) of left heel, no phantom pain, uses prosthesis to ambulate/ wheelchair for long distances, just completed PT, no recent falls RLE- LV EF 60-65% 02/20/2021, improved since amputation, compression hose to right leg daily, torsemide  reduced to 2x/week 05/30  Recent weights:  06/01- 188.1 lbs  05/01- 188.1 lbs  04/01- 186.1 lbs  Recent blood pressures:  06/25- 123/68  06/18- 104/62  06/11- 142/76  Past Medical History:  Diagnosis Date   Acute bronchiolitis    Acute osteomyelitis of calcaneum, left (HCC) 01/11/2021   Anemia    Bursitis of both hips    Complication of anesthesia    wife states pt was a little "loopy" for several days   Dementia (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   Hearing reduced    hearing aid   History of basal cell carcinoma    Hyperlipidemia    Malaise and fatigue    MRSA infection 01/11/2021   Osteoarthritis    hand   Osteoporosis    Palpitation    Parkinson's disease    Peripheral vascular disease (HCC)    blood clots   Pre-diabetes    Vitamin D deficiency    Past Surgical History:  Procedure Laterality Date   ABDOMINAL AORTOGRAM W/LOWER EXTREMITY N/A 06/12/2021   Procedure: ABDOMINAL AORTOGRAM W/LOWER EXTREMITY;  Surgeon: Iran Ouch, MD;  Location: [facility] INV[facility];  Service: Cardiovascular;  Laterality: N/A;   AMPUTATION Left 07/03/2021   Procedure: LEFT BELOW KNEE AMPUTATION;  Surgeon: Nadara Mustard, MD;  Location: [facility] OR;  Service: Orthopedics;  Laterality: Left;   CATARACT EXTRACTION  05/31/2015   Eye/right   COLONOSCOPY  02/05/2005, 03/06/2005   normal, 10 years ago   MOHS micrographic surgery     Face surgery   ORIF ACETABULAR FRACTURE Right 10/09/2020  Procedure: OPEN REDUCTION INTERNAL FIXATION (ORIF) ACETABULAR FRACTURE;  Surgeon: [name], MD;  Location: [facility] OR;  Service: Orthopedics;  Laterality: Right;    No Known Allergies  Allergies as of 10/03/2022   No Known Allergies      Medication List        Accurate as of October 03, 2022 10:08 AM. If you have any questions, ask your nurse or doctor.          acetaminophen 500 MG tablet Commonly known as: TYLENOL Take 1,000 mg by mouth 3 (three) times daily as needed for mild pain or moderate pain.    albuterol 108 (90 Base) MCG/ACT inhaler Commonly known as: VENTOLIN HFA Inhale 2 puffs into the lungs every 4 (four) hours as needed for wheezing or shortness of breath.   ARTIFICIAL TEARS OP Place 1 drop into both eyes in the morning and at bedtime.   ascorbic acid 500 MG tablet Commonly known as: VITAMIN C Take 500 mg by mouth daily.   calcium carbonate 1500 (600 Ca) MG Tabs tablet Commonly known as: OSCAL Take 600 mg of elemental calcium by mouth daily with breakfast.   carbidopa-levodopa 25-100 MG tablet Commonly known as: SINEMET IR Take 1 tablet by mouth in the morning, at noon, in the evening, and at bedtime. 06:00 AM, 12:00 PM, 06:00 PM, 12:00 AM   carbidopa-levodopa 50-200 MG tablet Commonly known as: SINEMET CR Take 1 tablet by mouth at bedtime.   cyanocobalamin 1000 MCG tablet Commonly known as: VITAMIN B12 Take 1,000 mcg by mouth daily.   feeding supplement (PRO-STAT SUGAR FREE 64) Liqd Take 30 mLs by mouth in the morning and at bedtime.   ferrous sulfate 325 (65 FE) MG tablet Take 325 mg by mouth every Monday, Wednesday, and Friday.   nystatin ointment Commonly known as: MYCOSTATIN Apply 1 Application topically every 12 (twelve) hours as needed.   polyethylene glycol 17 g packet Commonly known as: MIRALAX / GLYCOLAX Take 17 g by mouth every other day.   PRESERVISION AREDS 2 PO Take 1 capsule by mouth in the morning and at bedtime.   senna 8.6 MG tablet Commonly known as: SENOKOT Take 1 tablet by mouth daily.   torsemide 20 MG tablet Commonly known as: DEMADEX Take 20 mg by mouth 2 (two) times a week.   Vitamin D (Cholecalciferol) 25 MCG (1000 UT) Tabs Take 1,000 Units by mouth daily.   zinc oxide 20 % ointment Apply 1 application. topically See admin instructions. To buttocks after every incontinent episode and as needed for redness.        Review of Systems  Constitutional:  Negative for activity change and appetite change.  HENT:   Negative for congestion and trouble swallowing.   Eyes:  Negative for visual disturbance.  Respiratory:  Negative for cough, shortness of breath and wheezing.   Cardiovascular:  Negative for chest pain and leg swelling.  Gastrointestinal:  Negative for abdominal distention and abdominal pain.  Genitourinary:  Negative for dysuria, frequency and hematuria.  Musculoskeletal:  Positive for gait problem.  Skin:  Negative for wound.  Neurological:  Positive for weakness. Negative for dizziness and light-headedness.  Psychiatric/Behavioral:  Negative for confusion, dysphoric mood and sleep disturbance. The patient is not nervous/anxious.     Immunization History  Administered Date(s) Administered   COVID-19, mRNA, vaccine(Comirnaty)12 years and older 02/11/2022   Fluad Quad(high Dose 65+) 01/29/2022   H1N1 06/13/2008   Influenza Split 03/05/2006, 02/26/2007, 01/26/2008, 01/18/2009, 12/12/2009, 01/16/2011, 01/29/2012, 02/01/2013,  02/08/2014, 02/13/2015, 01/10/2016, 02/23/2017, 02/24/2018, 01/24/2019, 12/22/2019   Influenza, High Dose Seasonal PF 01/30/2021   Moderna SARS-COV2 Booster Vaccination 09/18/2021   PFIZER(Purple Top)SARS-COV-2 Vaccination 05/04/2019, 05/25/2019, 09/01/2020, 02/11/2022   Pfizer Covid-19 Vaccine Bivalent Booster 74yrs & up 12/27/2019, 12/26/2020   Pneumococcal Conjugate-13 02/10/2014   Pneumococcal Polysaccharide-23 06/10/2011   Tdap 06/05/2022   Zoster Recombinat (Shingrix) 07/04/2009, 03/24/2022   Pertinent  Health Maintenance Due  Topic Date Due   INFLUENZA VACCINE  11/06/2022      03/11/2022    1:03 PM 04/18/2022    9:20 AM 06/04/2022    1:12 PM 06/30/2022   11:40 AM 07/31/2022    2:46 PM  Fall Risk  Falls in the past year? 0 0 0 0 0  Was there an injury with Fall? 0 0 0 0 0  Fall Risk Category Calculator 0 0 0 0 0  Fall Risk Category (Retired) Low Low     (RETIRED) Patient Fall Risk Level Low fall risk Low fall risk     Patient at Risk for Falls Due to   No Fall Risks History of fall(s);Impaired balance/gait;Impaired mobility History of fall(s);Impaired balance/gait;Impaired mobility No Fall Risks  Fall risk Follow up Falls evaluation completed Falls evaluation completed Falls evaluation completed;Education provided;Falls prevention discussed Falls evaluation completed;Education provided;Falls prevention discussed Falls evaluation completed   Functional Status Survey:    Vitals:   10/03/22 1003  BP: 123/68  Pulse: 79  Resp: 20  Temp: 98.2 F (36.8 C)  SpO2: 97%  Weight: 188 lb 1.6 oz (85.3 kg)  Height: 5\' 10"  (1.778 m)   Body mass index is 26.99 kg/m. Physical Exam Vitals reviewed.  Constitutional:      General: He is not in acute distress. HENT:     Head: Normocephalic.  Eyes:     General:        Right eye: No discharge.        Left eye: No discharge.  Cardiovascular:     Rate and Rhythm: Normal rate and regular rhythm.     Pulses: Normal pulses.     Heart sounds: Murmur heard.  Pulmonary:     Effort: Pulmonary effort is normal. No respiratory distress.     Breath sounds: Normal breath sounds. No wheezing.  Abdominal:     General: Bowel sounds are normal. There is no distension.     Palpations: Abdomen is soft.     Tenderness: There is no abdominal tenderness.  Musculoskeletal:     Cervical back: Neck supple.     Right lower leg: No edema.     Left lower leg: No edema.     Comments: Left BKA> no skin breakdown   Skin:    General: Skin is warm.     Capillary Refill: Capillary refill takes less than 2 seconds.  Neurological:     General: No focal deficit present.     Mental Status: He is alert and oriented to person, place, and time.     Motor: Weakness present.     Gait: Gait abnormal.  Psychiatric:        Mood and Affect: Mood normal.     Labs reviewed: Recent Labs    03/03/22 0000 09/05/22 0000  NA 140 138  K 4.5 4.3  CL 104 103  CO2 31* 28*  BUN 38* 34*  CREATININE 1.2 1.0  CALCIUM 8.8 8.9    Recent Labs    03/03/22 0000 09/05/22 0000  AST 12* 13*  ALT 5*  7*  ALKPHOS 61 73  ALBUMIN 3.5 3.8   Recent Labs    10/31/21 0000 03/03/22 0000 09/05/22 0000  WBC 6.3 5.2 6.0  NEUTROABS  --  2,938.00  --   HGB 13.2* 12.5* 13.4*  HCT 41 38* 40*  PLT 217 184 176   Lab Results  Component Value Date   TSH 3.07 09/05/2022   Lab Results  Component Value Date   HGBA1C 6.0 09/05/2022   No results found for: "CHOL", "HDL", "LDLCALC", "LDLDIRECT", "TRIG", "CHOLHDL"  Significant Diagnostic Results in last 30 days:  No results found.  Assessment/Plan 1. Essential hypertension - controlled without medication  2. Stage 3b chronic kidney disease (HCC) - encourage hydration with water - avoid NSAIDS  3. Prediabetes - A1c 6.0 - diet controlled   4. Iron deficiency anemia secondary to inadequate dietary iron intake - hgb stable - cont ferrous sulfate M/W/F  5. Parkinson's disease, unspecified whether dyskinesia present, unspecified whether manifestations fluctuate - followed by Dr. Arbutus Leas - cont sinemet  6. Below-knee amputation of left lower extremity, sequela (HCC) - no recent falls - just competed PT - not skin breakdown to stump  7. Lower extremity edema - RLE no edema - cont torsemide 2x/week    Family/ staff Communication: plan discussed with patient and nurse  Labs/tests ordered: none

## 2022-10-30 ENCOUNTER — Encounter: Payer: Self-pay | Admitting: Internal Medicine

## 2022-10-30 ENCOUNTER — Non-Acute Institutional Stay (SKILLED_NURSING_FACILITY): Payer: Medicare Other | Admitting: Internal Medicine

## 2022-10-30 DIAGNOSIS — S88112S Complete traumatic amputation at level between knee and ankle, left lower leg, sequela: Secondary | ICD-10-CM

## 2022-10-30 DIAGNOSIS — D508 Other iron deficiency anemias: Secondary | ICD-10-CM | POA: Diagnosis not present

## 2022-10-30 DIAGNOSIS — G20A1 Parkinson's disease without dyskinesia, without mention of fluctuations: Secondary | ICD-10-CM | POA: Diagnosis not present

## 2022-10-30 DIAGNOSIS — N1832 Chronic kidney disease, stage 3b: Secondary | ICD-10-CM

## 2022-10-30 DIAGNOSIS — R600 Localized edema: Secondary | ICD-10-CM

## 2022-10-30 DIAGNOSIS — I10 Essential (primary) hypertension: Secondary | ICD-10-CM | POA: Diagnosis not present

## 2022-10-30 NOTE — Progress Notes (Signed)
Location:  [facility] Room Number: 27A Place of Service:  SNF [phone]) Provider:  [name], MD   [name], MD  Patient Care Team: [name], MD as PCP - General (Internal Medicine) [name], Octaviano Batty, DO as Consulting Physician (Neurology)  Extended Emergency Contact Information Primary Emergency Contact: Diego,jane Address: [address] [place].          apt [phone] Darden Amber of [place] Home Phone: [phone] Mobile Phone: [phone] Relation: Spouse Secondary Emergency Contact: Osterlund,kenneth Home Phone: [phone] Mobile Phone: [phone] Relation: Son  Code Status:  Full Code Goals of care: Advanced Directive information    10/30/2022    2:51 PM  Advanced Directives  Does Patient Have a Medical Advance Directive? Yes  Type of Estate agent of [place];Living will  Does patient want to make changes to medical advance directive? No - Patient declined  Copy of Healthcare Power of Attorney in Chart? Yes - validated most recent copy scanned in chart (See row information)     Chief Complaint  Patient presents with   Medical Management of Chronic Issues    Patient is being seen for a routine visit.    HPI:  Pt is a 89 y.o. male seen today for medical management of chronic diseases.    Lives in SNF   Patient is s/p Left BKA for Left Osteomyelitis    h/o Parkinson disease Follows with Dr [name] Diagnosed in 2019 in [place]   h/o Osteoporosis , Vit D def Has been on Fosamax before, BPH, Arthritis,   right femur fracture followed by acute DVT of right leg.  He continues to stay stable Walks with therapist and does his own exercise Wound on back of his head healing well Otherwise stays in his wheelchair Had no new complains today Wt Readings from Last 3 Encounters:  10/30/22 189 lb 6.4 oz (85.9 kg)  10/03/22 188 lb 1.6 oz (85.3 kg)  08/28/22 188 lb 1.6 oz (85.3 kg)   No Falls  Weight and  appetite stable   Past Medical History:  Diagnosis Date   Acute bronchiolitis    Acute osteomyelitis of calcaneum, left (HCC) 01/11/2021   Anemia    Bursitis of both hips    Complication of anesthesia    wife states pt was a little "loopy" for several days   Dementia (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   Hearing reduced    hearing aid   History of basal cell carcinoma    Hyperlipidemia    Malaise and fatigue    MRSA infection 01/11/2021   Osteoarthritis    hand   Osteoporosis    Palpitation    Parkinson's disease    Peripheral vascular disease (HCC)    blood clots   Pre-diabetes    Vitamin D deficiency    Past Surgical History:  Procedure Laterality Date   ABDOMINAL AORTOGRAM W/LOWER EXTREMITY N/A 06/12/2021   Procedure: ABDOMINAL AORTOGRAM W/LOWER EXTREMITY;  Surgeon: Iran Ouch, MD;  Location: [facility];  Service: Cardiovascular;  Laterality: N/A;   AMPUTATION Left 07/03/2021   Procedure: LEFT BELOW KNEE AMPUTATION;  Surgeon: Nadara Mustard, MD;  Location: [facility] OR;  Service: Orthopedics;  Laterality: Left;   CATARACT EXTRACTION  05/31/2015   Eye/right   COLONOSCOPY  02/05/2005, 03/06/2005   normal, 10 years ago   MOHS micrographic surgery     Face surgery   ORIF ACETABULAR FRACTURE Right 10/09/2020  Procedure: OPEN REDUCTION INTERNAL FIXATION (ORIF) ACETABULAR FRACTURE;  Surgeon: [name], MD;  Location: [facility] OR;  Service: Orthopedics;  Laterality: Right;    No Known Allergies  Outpatient Encounter Medications as of 10/30/2022  Medication Sig   acetaminophen (TYLENOL) 500 MG tablet Take 1,000 mg by mouth 3 (three) times daily as needed for mild pain or moderate pain.   albuterol (VENTOLIN HFA) 108 (90 Base) MCG/ACT inhaler Inhale 2 puffs into the lungs every 4 (four) hours as needed for wheezing or shortness of breath.   Amino Acids-Protein Hydrolys (FEEDING SUPPLEMENT, PRO-STAT SUGAR FREE 64,) LIQD Take 30 mLs by mouth in the morning and at bedtime.    ascorbic acid (VITAMIN C) 500 MG tablet Take 500 mg by mouth daily.   calcium carbonate (OSCAL) 1500 (600 Ca) MG TABS tablet Take 600 mg of elemental calcium by mouth daily with breakfast.   carbidopa-levodopa (SINEMET CR) 50-200 MG tablet Take 1 tablet by mouth at bedtime.   carbidopa-levodopa (SINEMET IR) 25-100 MG tablet Take 1 tablet by mouth in the morning, at noon, in the evening, and at bedtime. 06:00 AM, 12:00 PM, 06:00 PM, 12:00 AM   Carboxymethylcellulose Sodium (ARTIFICIAL TEARS OP) Place 1 drop into both eyes in the morning and at bedtime.   ferrous sulfate 325 (65 FE) MG tablet Take 325 mg by mouth every Monday, Wednesday, and Friday.   Multiple Vitamins-Minerals (PRESERVISION AREDS 2 PO) Take 1 capsule by mouth in the morning and at bedtime.   nystatin ointment (MYCOSTATIN) Apply 1 Application topically every 12 (twelve) hours as needed.   polyethylene glycol (MIRALAX / GLYCOLAX) 17 g packet Take 17 g by mouth every other day.   senna (SENOKOT) 8.6 MG tablet Take 1 tablet by mouth daily.   torsemide (DEMADEX) 20 MG tablet Take 20 mg by mouth 2 (two) times a week.   vitamin B-12 (CYANOCOBALAMIN) 1000 MCG tablet Take 1,000 mcg by mouth daily.   Vitamin D, Cholecalciferol, 25 MCG (1000 UT) TABS Take 1,000 Units by mouth daily.   zinc oxide 20 % ointment Apply 1 application. topically See admin instructions. To buttocks after every incontinent episode and as needed for redness.   No facility-administered encounter medications on file as of 10/30/2022.    Review of Systems  Constitutional:  Negative for activity change, appetite change and unexpected weight change.  HENT: Negative.    Respiratory:  Negative for cough and shortness of breath.   Cardiovascular:  Negative for leg swelling.  Gastrointestinal:  Negative for constipation.  Genitourinary:  Negative for frequency.  Musculoskeletal:  Positive for gait problem. Negative for arthralgias and myalgias.  Skin: Negative.  Negative  for rash.  Neurological:  Negative for dizziness and weakness.  Psychiatric/Behavioral:  Negative for confusion and sleep disturbance.   All other systems reviewed and are negative.   Immunization History  Administered Date(s) Administered   COVID-19, mRNA, vaccine(Comirnaty)12 years and older 02/11/2022   Fluad Quad(high Dose 65+) 01/29/2022   H1N1 06/13/2008   Influenza Split 03/05/2006, 02/26/2007, 01/26/2008, 01/18/2009, 12/12/2009, 01/16/2011, 01/29/2012, 02/01/2013, 02/08/2014, 02/13/2015, 01/10/2016, 02/23/2017, 02/24/2018, 01/24/2019, 12/22/2019   Influenza, High Dose Seasonal PF 01/30/2021   Moderna SARS-COV2 Booster Vaccination 09/18/2021   PFIZER(Purple Top)SARS-COV-2 Vaccination 05/04/2019, 05/25/2019, 09/01/2020, 02/11/2022   Pfizer Covid-19 Vaccine Bivalent Booster 65yrs & up 12/27/2019, 12/26/2020   Pneumococcal Conjugate-13 02/10/2014   Pneumococcal Polysaccharide-23 06/10/2011   Tdap 06/05/2022   Zoster Recombinant(Shingrix) 07/04/2009, 03/24/2022   Pertinent  Health Maintenance Due  Topic Date Due  INFLUENZA VACCINE  11/06/2022      06/04/2022    1:12 PM 06/30/2022   11:40 AM 07/31/2022    2:46 PM 10/03/2022    2:02 PM 10/30/2022    2:50 PM  Fall Risk  Falls in the past year? 0 0 0 0 0  Was there an injury with Fall? 0 0 0 0 0  Fall Risk Category Calculator 0 0 0 0 0  Patient at Risk for Falls Due to History of fall(s);Impaired balance/gait;Impaired mobility History of fall(s);Impaired balance/gait;Impaired mobility No Fall Risks History of fall(s);Impaired balance/gait;Impaired mobility No Fall Risks  Fall risk Follow up Falls evaluation completed;Education provided;Falls prevention discussed Falls evaluation completed;Education provided;Falls prevention discussed Falls evaluation completed Falls evaluation completed;Education provided;Falls prevention discussed Falls evaluation completed   Functional Status Survey:    Vitals:   10/30/22 1437  BP: 128/75   Pulse: 77  Resp: 18  Temp: 97.9 F (36.6 C)  TempSrc: Temporal  SpO2: 94%  Weight: 189 lb 6.4 oz (85.9 kg)  Height: 5\' 10"  (1.778 m)   Body mass index is 27.18 kg/m. Physical Exam Vitals reviewed.  Constitutional:      Appearance: Normal appearance.  HENT:     Head: Normocephalic.     Nose: Nose normal.     Mouth/Throat:     Mouth: Mucous membranes are moist.     Pharynx: Oropharynx is clear.  Eyes:     Pupils: Pupils are equal, round, and reactive to light.  Cardiovascular:     Rate and Rhythm: Normal rate and regular rhythm.     Pulses: Normal pulses.     Heart sounds: No murmur heard. Pulmonary:     Effort: Pulmonary effort is normal. No respiratory distress.     Breath sounds: Normal breath sounds. No rales.  Abdominal:     General: Abdomen is flat. Bowel sounds are normal.     Palpations: Abdomen is soft.  Musculoskeletal:     Cervical back: Neck supple.     Comments: S/P Left BKA Right leg Mild Swelling  Skin:    General: Skin is warm.     Comments: Small Pressure wound on his scalp Almost healed  Neurological:     General: No focal deficit present.     Mental Status: He is alert and oriented to person, place, and time.     Comments: Stiffness in all extremeties  Psychiatric:        Mood and Affect: Mood normal.        Thought Content: Thought content normal.     Labs reviewed: Recent Labs    03/03/22 0000 09/05/22 0000  NA 140 138  K 4.5 4.3  CL 104 103  CO2 31* 28*  BUN 38* 34*  CREATININE 1.2 1.0  CALCIUM 8.8 8.9   Recent Labs    03/03/22 0000 09/05/22 0000  AST 12* 13*  ALT 5* 7*  ALKPHOS 61 73  ALBUMIN 3.5 3.8   Recent Labs    10/31/21 0000 03/03/22 0000 09/05/22 0000  WBC 6.3 5.2 6.0  NEUTROABS  --  2,938.00  --   HGB 13.2* 12.5* 13.4*  HCT 41 38* 40*  PLT 217 184 176   Lab Results  Component Value Date   TSH 3.07 09/05/2022   Lab Results  Component Value Date   HGBA1C 6.0 09/05/2022   No results found for:  "CHOL", "HDL", "LDLCALC", "LDLDIRECT", "TRIG", "CHOLHDL"  Significant Diagnostic Results in last 30 days:  No results found.  Assessment/Plan 1. Parkinson's disease, unspecified whether dyskinesia present, unspecified whether manifestations fluctuate On Sinemet Follows with Dr [name]  2. Essential hypertension Off meds  3. Stage 3b chronic kidney disease (HCC) Creat stable  4. Iron deficiency anemia secondary to inadequate dietary iron intake On low dose of iron HGB stable  5. Below-knee amputation of left lower extremity, sequela (HCC) Has prosthetics But mostly stays in wheelchair  6. Lower extremity edema Low dose of torsemide 7 Small healed pressure wound on his scalp   Family/ staff Communication:   Labs/tests ordered:

## 2022-11-28 ENCOUNTER — Encounter: Payer: Self-pay | Admitting: Orthopedic Surgery

## 2022-11-28 ENCOUNTER — Non-Acute Institutional Stay (SKILLED_NURSING_FACILITY): Payer: Medicare Other | Admitting: Orthopedic Surgery

## 2022-11-28 DIAGNOSIS — R600 Localized edema: Secondary | ICD-10-CM | POA: Diagnosis not present

## 2022-11-28 DIAGNOSIS — N1832 Chronic kidney disease, stage 3b: Secondary | ICD-10-CM

## 2022-11-28 DIAGNOSIS — G20A1 Parkinson's disease without dyskinesia, without mention of fluctuations: Secondary | ICD-10-CM

## 2022-11-28 DIAGNOSIS — I10 Essential (primary) hypertension: Secondary | ICD-10-CM

## 2022-11-28 DIAGNOSIS — H6123 Impacted cerumen, bilateral: Secondary | ICD-10-CM | POA: Diagnosis not present

## 2022-11-28 DIAGNOSIS — S88112S Complete traumatic amputation at level between knee and ankle, left lower leg, sequela: Secondary | ICD-10-CM

## 2022-11-28 DIAGNOSIS — R7303 Prediabetes: Secondary | ICD-10-CM

## 2022-11-28 DIAGNOSIS — D508 Other iron deficiency anemias: Secondary | ICD-10-CM

## 2022-11-28 NOTE — Progress Notes (Unsigned)
Location:  [facility] Room Number: 27/A Place of Service:  SNF [phone]) Provider:  Octavia Heir, NP   [name], MD  Patient Care Team: [name], MD as PCP - General (Internal Medicine) [name], Octaviano Batty, DO as Consulting Physician (Neurology)  Extended Emergency Contact Information Primary Emergency Contact: Hilleary,jane Address: [address] [place].          apt [phone] Darden Amber of [place] Home Phone: [phone] Mobile Phone: [phone] Relation: Spouse Secondary Emergency Contact: Guglielmo,kenneth Home Phone: [phone] Mobile Phone: [phone] Relation: Son  Code Status:  DNR Goals of care: Advanced Directive information    10/30/2022    2:51 PM  Advanced Directives  Does Patient Have a Medical Advance Directive? Yes  Type of Estate agent of [place];Living will  Does patient want to make changes to medical advance directive? No - Patient declined  Copy of Healthcare Power of Attorney in Chart? Yes - validated most recent copy scanned in chart (See row information)     Chief Complaint  Patient presents with   Medical Management of Chronic Issues    HPI:  Pt is a 89 y.o. male seen today for medical management of chronic diseases.    Parkinson's-  diagnosed 2019, followed by Dr. Arbutus Leas, he is napping more during day per wife, recent BIMS 15/15 (08/12), remains on sinemet S/p left BKA- surgery 03/29 by Dr. Lajoyce Corners due to ongoing osteomyelitis (+MRSA) of left heel, no phantom pain, uses prosthesis to ambulate/ wheelchair for long distances RLE- LV EF 60-65% 02/20/2021, improved since amputation, compression hose to right leg daily, torsemide reduced to 2x/week 05/30 HTN- BUN/creat 34/1.0 09/05/2022, coreg discontinued due to hypotension, see trends below CKD 3b- see above Prediabetes-  A1c 6.0 (05/31)> was 5.8 01/16/2022 Iron deficiency anemia- hgb 13.4 09/05/2022, remains on ferrous sulfate M/W/F  No  recent falls or injuries.   Recent blood pressures:  08/20- 104/65  08/13- 112/70  08/16- 122/79  Recent weights:  08/02- 185.7 lbs  07/03- 189.6 lbs  06/01- 188.1 lbs   Past Medical History:  Diagnosis Date   Acute bronchiolitis    Acute osteomyelitis of calcaneum, left (HCC) 01/11/2021   Anemia    Bursitis of both hips    Complication of anesthesia    wife states pt was a little "loopy" for several days   Dementia (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   Hearing reduced    hearing aid   History of basal cell carcinoma    Hyperlipidemia    Malaise and fatigue    MRSA infection 01/11/2021   Osteoarthritis    hand   Osteoporosis    Palpitation    Parkinson's disease    Peripheral vascular disease (HCC)    blood clots   Pre-diabetes    Vitamin D deficiency    Past Surgical History:  Procedure Laterality Date   ABDOMINAL AORTOGRAM W/LOWER EXTREMITY N/A 06/12/2021   Procedure: ABDOMINAL AORTOGRAM W/LOWER EXTREMITY;  Surgeon: Iran Ouch, MD;  Location: [facility];  Service: Cardiovascular;  Laterality: N/A;   AMPUTATION Left 07/03/2021   Procedure: LEFT BELOW KNEE AMPUTATION;  Surgeon: Nadara Mustard, MD;  Location: [facility] OR;  Service: Orthopedics;  Laterality: Left;   CATARACT EXTRACTION  05/31/2015   Eye/right   COLONOSCOPY  02/05/2005, 03/06/2005   normal, 10 years ago   MOHS micrographic surgery     Face surgery   ORIF ACETABULAR FRACTURE Right  10/09/2020   Procedure: OPEN REDUCTION INTERNAL FIXATION (ORIF) ACETABULAR FRACTURE;  Surgeon: [name], MD;  Location: [facility] OR;  Service: Orthopedics;  Laterality: Right;    No Known Allergies  Outpatient Encounter Medications as of 11/28/2022  Medication Sig   acetaminophen (TYLENOL) 500 MG tablet Take 1,000 mg by mouth 3 (three) times daily as needed for mild pain or moderate pain.   albuterol (VENTOLIN HFA) 108 (90 Base) MCG/ACT inhaler Inhale 2 puffs into the lungs every 4 (four) hours as needed for wheezing or  shortness of breath.   Amino Acids-Protein Hydrolys (FEEDING SUPPLEMENT, PRO-STAT SUGAR FREE 64,) LIQD Take 30 mLs by mouth in the morning and at bedtime.   ascorbic acid (VITAMIN C) 500 MG tablet Take 500 mg by mouth daily.   calcium carbonate (OSCAL) 1500 (600 Ca) MG TABS tablet Take 600 mg of elemental calcium by mouth daily with breakfast.   carbidopa-levodopa (SINEMET CR) 50-200 MG tablet Take 1 tablet by mouth at bedtime.   carbidopa-levodopa (SINEMET IR) 25-100 MG tablet Take 1 tablet by mouth in the morning, at noon, in the evening, and at bedtime. 06:00 AM, 12:00 PM, 06:00 PM, 12:00 AM   Carboxymethylcellulose Sodium (ARTIFICIAL TEARS OP) Place 1 drop into both eyes in the morning and at bedtime.   ferrous sulfate 325 (65 FE) MG tablet Take 325 mg by mouth every Monday, Wednesday, and Friday.   Multiple Vitamins-Minerals (PRESERVISION AREDS 2 PO) Take 1 capsule by mouth in the morning and at bedtime.   nystatin ointment (MYCOSTATIN) Apply 1 Application topically every 12 (twelve) hours as needed.   polyethylene glycol (MIRALAX / GLYCOLAX) 17 g packet Take 17 g by mouth every other day.   senna (SENOKOT) 8.6 MG tablet Take 1 tablet by mouth daily.   torsemide (DEMADEX) 20 MG tablet Take 20 mg by mouth 2 (two) times a week.   vitamin B-12 (CYANOCOBALAMIN) 1000 MCG tablet Take 1,000 mcg by mouth daily.   Vitamin D, Cholecalciferol, 25 MCG (1000 UT) TABS Take 1,000 Units by mouth daily.   zinc oxide 20 % ointment Apply 1 application. topically See admin instructions. To buttocks after every incontinent episode and as needed for redness.   No facility-administered encounter medications on file as of 11/28/2022.    Review of Systems  Constitutional:  Negative for activity change and fatigue.  HENT:  Positive for hearing loss. Negative for trouble swallowing.   Eyes:  Negative for visual disturbance.  Respiratory:  Negative for cough, shortness of breath and wheezing.   Cardiovascular:   Positive for leg swelling. Negative for chest pain.  Gastrointestinal:  Negative for abdominal distention and abdominal pain.  Genitourinary:  Negative for dysuria and hematuria.  Musculoskeletal:  Positive for gait problem.  Skin:  Negative for wound.  Neurological:  Positive for tremors and weakness. Negative for dizziness and headaches.  Psychiatric/Behavioral:  Negative for confusion, dysphoric mood and sleep disturbance. The patient is not nervous/anxious.     Immunization History  Administered Date(s) Administered   COVID-19, mRNA, vaccine(Comirnaty)12 years and older 02/11/2022   Fluad Quad(high Dose 65+) 01/29/2022   H1N1 06/13/2008   Influenza Split 03/05/2006, 02/26/2007, 01/26/2008, 01/18/2009, 12/12/2009, 01/16/2011, 01/29/2012, 02/01/2013, 02/08/2014, 02/13/2015, 01/10/2016, 02/23/2017, 02/24/2018, 01/24/2019, 12/22/2019   Influenza, High Dose Seasonal PF 01/30/2021   Moderna SARS-COV2 Booster Vaccination 09/18/2021   PFIZER(Purple Top)SARS-COV-2 Vaccination 05/04/2019, 05/25/2019, 09/01/2020, 02/11/2022   Pfizer Covid-19 Vaccine Bivalent Booster 8yrs & up 12/27/2019, 12/26/2020   Pneumococcal Conjugate-13 02/10/2014   Pneumococcal Polysaccharide-23  06/10/2011   Tdap 06/05/2022   Zoster Recombinant(Shingrix) 07/04/2009, 03/24/2022   Pertinent  Health Maintenance Due  Topic Date Due   INFLUENZA VACCINE  11/06/2022      06/04/2022    1:12 PM 06/30/2022   11:40 AM 07/31/2022    2:46 PM 10/03/2022    2:02 PM 10/30/2022    2:50 PM  Fall Risk  Falls in the past year? 0 0 0 0 0  Was there an injury with Fall? 0 0 0 0 0  Fall Risk Category Calculator 0 0 0 0 0  Patient at Risk for Falls Due to History of fall(s);Impaired balance/gait;Impaired mobility History of fall(s);Impaired balance/gait;Impaired mobility No Fall Risks History of fall(s);Impaired balance/gait;Impaired mobility No Fall Risks  Fall risk Follow up Falls evaluation completed;Education provided;Falls  prevention discussed Falls evaluation completed;Education provided;Falls prevention discussed Falls evaluation completed Falls evaluation completed;Education provided;Falls prevention discussed Falls evaluation completed   Functional Status Survey:    Vitals:   11/28/22 1644  BP: 104/65  Pulse: 78  Resp: 18  Temp: (!) 97.2 F (36.2 C)  SpO2: 92%  Weight: 185 lb 11.2 oz (84.2 kg)  Height: 5\' 10"  (1.778 m)   Body mass index is 26.65 kg/m. Physical Exam Vitals reviewed.  Constitutional:      General: He is not in acute distress. HENT:     Head: Normocephalic.     Right Ear: There is impacted cerumen.     Left Ear: There is impacted cerumen.     Nose: Nose normal.     Mouth/Throat:     Mouth: Mucous membranes are moist.  Eyes:     General:        Right eye: No discharge.        Left eye: No discharge.  Cardiovascular:     Rate and Rhythm: Normal rate and regular rhythm.     Pulses: Normal pulses.     Heart sounds: Murmur heard.  Pulmonary:     Effort: Pulmonary effort is normal. No respiratory distress.     Breath sounds: Normal breath sounds. No wheezing or rales.  Abdominal:     General: Bowel sounds are normal.     Palpations: Abdomen is soft.  Musculoskeletal:     Cervical back: Neck supple.     Right lower leg: No edema.     Comments: Left BKA  Skin:    General: Skin is warm.     Capillary Refill: Capillary refill takes less than 2 seconds.  Neurological:     General: No focal deficit present.     Mental Status: He is alert and oriented to person, place, and time.     Motor: Weakness present.     Gait: Gait abnormal.     Comments: Walker/wheelchair  Psychiatric:        Mood and Affect: Mood normal.     Labs reviewed: Recent Labs    03/03/22 0000 09/05/22 0000  NA 140 138  K 4.5 4.3  CL 104 103  CO2 31* 28*  BUN 38* 34*  CREATININE 1.2 1.0  CALCIUM 8.8 8.9   Recent Labs    03/03/22 0000 09/05/22 0000  AST 12* 13*  ALT 5* 7*  ALKPHOS 61  73  ALBUMIN 3.5 3.8   Recent Labs    03/03/22 0000 09/05/22 0000  WBC 5.2 6.0  NEUTROABS 2,938.00  --   HGB 12.5* 13.4*  HCT 38* 40*  PLT 184 176   Lab Results  Component  Value Date   TSH 3.07 09/05/2022   Lab Results  Component Value Date   HGBA1C 6.0 09/05/2022   No results found for: "CHOL", "HDL", "LDLCALC", "LDLDIRECT", "TRIG", "CHOLHDL"  Significant Diagnostic Results in last 30 days:  No results found.  Assessment/Plan 1. Bilateral impacted cerumen - start Debrox- 5 gtts to both ears x 3 days - flush ears with warm water when debrox complete  2. Parkinson's disease, unspecified whether dyskinesia present, unspecified whether manifestations fluctuate - followed by Dr. Arbutus Leas - sleeping more per wife - ambulates mostly with w/c  - cont sinemet  3. Below-knee amputation of left lower extremity, sequela (HCC) - cont skilled nursing   4. Edema of right lower leg - cont low dose torsemide and compression stocking  5. Primary hypertension - controlled without medication  6. Stage 3b chronic kidney disease (HCC) - avoid NSAIDS - encourage hydration  7. Prediabetes - A1c 6.0 - diet controlled  8. Iron deficiency anemia secondary to inadequate dietary iron intake - hgb stable - cont ferrous sulfate    Family/ staff Communication: plan discussed with patient and nurse  Labs/tests ordered:  none

## 2022-12-12 ENCOUNTER — Encounter: Payer: Self-pay | Admitting: Orthopedic Surgery

## 2022-12-12 ENCOUNTER — Non-Acute Institutional Stay (SKILLED_NURSING_FACILITY): Payer: Medicare Other | Admitting: Orthopedic Surgery

## 2022-12-12 DIAGNOSIS — R600 Localized edema: Secondary | ICD-10-CM | POA: Diagnosis not present

## 2022-12-12 NOTE — Progress Notes (Unsigned)
Location:   [facility]  Nursing Home Room Number: 27-A Place of Service:  SNF [phone]) Provider:  Hazle Nordmann, NP  PCP: [name], MD  Patient Care Team: [name], MD as PCP - General (Internal Medicine) [name], Octaviano Batty, DO as Consulting Physician (Neurology)  Extended Emergency Contact Information Primary Emergency Contact: Alabi,jane Address: [address] [place].          apt [phone] D[place] of [place] Home Phone: [phone] Mobile Phone: [phone] Relation: Spouse Secondary Emergency Contact: Ravan,kenneth Home Phone: [phone] Mobile Phone: [phone] Relation: Son  Code Status:  FULL CODE Goals of care: Advanced Directive information    12/12/2022    3:19 PM  Advanced Directives  Does Patient Have a Medical Advance Directive? Yes  Type of Estate agent of [place];Living will  Does patient want to make changes to medical advance directive? No - Patient declined  Copy of Healthcare Power of Attorney in Chart? Yes - validated most recent copy scanned in chart (See row information)     Chief Complaint  Patient presents with   Acute Visit    Right leg edema.    HPI:  Pt is a 89 y.o. male seen today for an acute visit for    Past Medical History:  Diagnosis Date   Acute bronchiolitis    Acute osteomyelitis of calcaneum, left (HCC) 01/11/2021   Anemia    Bursitis of both hips    Complication of anesthesia    wife states pt was a little "loopy" for several days   Dementia (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   Hearing reduced    hearing aid   History of basal cell carcinoma    Hyperlipidemia    Malaise and fatigue    MRSA infection 01/11/2021   Osteoarthritis    hand   Osteoporosis    Palpitation    Parkinson's disease    Peripheral vascular disease (HCC)    blood clots   Pre-diabetes    Vitamin D deficiency    Past Surgical History:  Procedure Laterality Date   ABDOMINAL AORTOGRAM W/LOWER  EXTREMITY N/A 06/12/2021   Procedure: ABDOMINAL AORTOGRAM W/LOWER EXTREMITY;  Surgeon: Iran Ouch, MD;  [facility]: [facility] INVASIVE [facility];  Service: Cardiovascular;  Laterality: N/A;   AMPUTATION Left 07/03/2021   Procedure: LEFT BELOW KNEE AMPUTATION;  Surgeon: Nadara Mustard, MD;  [facility] OR;  Service: Orthopedics;  Laterality: Left;   CATARACT EXTRACTION  05/31/2015   Eye/right   COLONOSCOPY  02/05/2005, 03/06/2005   normal, 10 years ago   MOHS micrographic surgery     Face surgery   ORIF ACETABULAR FRACTURE Right 10/09/2020   Procedure: OPEN REDUCTION INTERNAL FIXATION (ORIF) ACETABULAR FRACTURE;  Surgeon: [name], MD;  Location: [facility] OR;  Service: Orthopedics;  Laterality: Right;    No Known Allergies  Allergies as of 12/12/2022   No Known Allergies      Medication List        Accurate as of December 12, 2022  3:20 PM. If you have any questions, ask your nurse or doctor.          acetaminophen 500 MG tablet Commonly known as: TYLENOL Take 1,000 mg by mouth 3 (three) times daily as needed for mild pain or moderate pain.   albuterol 108 (90 Base) MCG/ACT inhaler Commonly known as: VENTOLIN HFA Inhale 2 puffs into the lungs every 4 (four) hours as needed for wheezing or  shortness of breath.   ARTIFICIAL TEARS OP Place 1 drop into both eyes in the morning and at bedtime.   ascorbic acid 500 MG tablet Commonly known as: VITAMIN C Take 500 mg by mouth daily.   calcium carbonate 1500 (600 Ca) MG Tabs tablet Commonly known as: OSCAL Take 600 mg of elemental calcium by mouth daily with breakfast.   carbidopa-levodopa 25-100 MG tablet Commonly known as: SINEMET IR Take 1 tablet by mouth in the morning, at noon, in the evening, and at bedtime. 06:00 AM, 12:00 PM, 06:00 PM, 12:00 AM   carbidopa-levodopa 50-200 MG tablet Commonly known as: SINEMET CR Take 1 tablet by mouth at bedtime.   cyanocobalamin 1000 MCG tablet Commonly known as: VITAMIN B12 Take 1,000  mcg by mouth daily.   feeding supplement (PRO-STAT SUGAR FREE 64) Liqd Take 30 mLs by mouth in the morning and at bedtime.   ferrous sulfate 325 (65 FE) MG tablet Take 325 mg by mouth every Monday, Wednesday, and Friday.   nystatin ointment Commonly known as: MYCOSTATIN Apply 1 Application topically every 12 (twelve) hours as needed.   polyethylene glycol 17 g packet Commonly known as: MIRALAX / GLYCOLAX Take 17 g by mouth every other day.   PRESERVISION AREDS 2 PO Take 1 capsule by mouth in the morning and at bedtime.   senna 8.6 MG tablet Commonly known as: SENOKOT Take 1 tablet by mouth daily.   torsemide 20 MG tablet Commonly known as: DEMADEX Take 20 mg by mouth 2 (two) times a week.   Vitamin D (Cholecalciferol) 25 MCG (1000 UT) Tabs Take 1,000 Units by mouth daily.   zinc oxide 20 % ointment Apply 1 application. topically See admin instructions. To buttocks after every incontinent episode and as needed for redness.        Review of Systems  Immunization History  Administered Date(s) Administered   COVID-19, mRNA, vaccine(Comirnaty)12 years and older 02/11/2022   Fluad Quad(high Dose 65+) 01/29/2022   H1N1 06/13/2008   Influenza Split 03/05/2006, 02/26/2007, 01/26/2008, 01/18/2009, 12/12/2009, 01/16/2011, 01/29/2012, 02/01/2013, 02/08/2014, 02/13/2015, 01/10/2016, 02/23/2017, 02/24/2018, 01/24/2019, 12/22/2019   Influenza, High Dose Seasonal PF 01/30/2021   Moderna SARS-COV2 Booster Vaccination 09/18/2021   PFIZER(Purple Top)SARS-COV-2 Vaccination 05/04/2019, 05/25/2019, 09/01/2020, 02/11/2022   Pfizer Covid-19 Vaccine Bivalent Booster 45yrs & up 12/27/2019, 12/26/2020   Pneumococcal Conjugate-13 02/10/2014   Pneumococcal Polysaccharide-23 06/10/2011   Tdap 06/05/2022   Zoster Recombinant(Shingrix) 07/04/2009, 03/24/2022   Pertinent  Health Maintenance Due  Topic Date Due   INFLUENZA VACCINE  11/06/2022      06/30/2022   11:40 AM 07/31/2022    2:46 PM  10/03/2022    2:02 PM 10/30/2022    2:50 PM 12/01/2022    9:07 AM  Fall Risk  Falls in the past year? 0 0 0 0 0  Was there an injury with Fall? 0 0 0 0 0  Fall Risk Category Calculator 0 0 0 0 0  Patient at Risk for Falls Due to History of fall(s);Impaired balance/gait;Impaired mobility No Fall Risks History of fall(s);Impaired balance/gait;Impaired mobility No Fall Risks History of fall(s);Impaired balance/gait;Impaired mobility  Fall risk Follow up Falls evaluation completed;Education provided;Falls prevention discussed Falls evaluation completed Falls evaluation completed;Education provided;Falls prevention discussed Falls evaluation completed Falls evaluation completed;Education provided;Falls prevention discussed   Functional Status Survey:    Vitals:   12/12/22 1513  BP: 128/79  Pulse: 78  Resp: 17  Temp: (!) 97.4 F (36.3 C)  SpO2: 95%  Weight: 190 lb  8 oz (86.4 kg)  Height: 5\' 10"  (1.778 m)   Body mass index is 27.33 kg/m. Physical Exam  Labs reviewed: Recent Labs    03/03/22 0000 09/05/22 0000  NA 140 138  K 4.5 4.3  CL 104 103  CO2 31* 28*  BUN 38* 34*  CREATININE 1.2 1.0  CALCIUM 8.8 8.9   Recent Labs    03/03/22 0000 09/05/22 0000  AST 12* 13*  ALT 5* 7*  ALKPHOS 61 73  ALBUMIN 3.5 3.8   Recent Labs    03/03/22 0000 09/05/22 0000  WBC 5.2 6.0  NEUTROABS 2,938.00  --   HGB 12.5* 13.4*  HCT 38* 40*  PLT 184 176   Lab Results  Component Value Date   TSH 3.07 09/05/2022   Lab Results  Component Value Date   HGBA1C 6.0 09/05/2022   No results found for: "CHOL", "HDL", "LDLCALC", "LDLDIRECT", "TRIG", "CHOLHDL"  Significant Diagnostic Results in last 30 days:  No results found.  Assessment/Plan There are no diagnoses linked to this encounter.   Family/ staff Communication:   Labs/tests ordered:

## 2022-12-15 ENCOUNTER — Telehealth: Payer: Medicare Other | Admitting: Orthopedic Surgery

## 2022-12-15 ENCOUNTER — Other Ambulatory Visit: Payer: Self-pay | Admitting: Orthopedic Surgery

## 2022-12-15 NOTE — Telephone Encounter (Signed)
09/06 increased RLE edema, 2+ pitting. He was given torsemide 20 mg x 2 days. Edema improved, but still 1+ pitting. Will increase weekly Torsemide to 20 mg 3x/week.

## 2022-12-29 ENCOUNTER — Encounter: Payer: Self-pay | Admitting: Orthopedic Surgery

## 2022-12-29 ENCOUNTER — Non-Acute Institutional Stay (SKILLED_NURSING_FACILITY): Payer: Self-pay | Admitting: Orthopedic Surgery

## 2022-12-29 ENCOUNTER — Telehealth: Payer: Self-pay | Admitting: Adult Health

## 2022-12-29 DIAGNOSIS — J181 Lobar pneumonia, unspecified organism: Secondary | ICD-10-CM

## 2022-12-29 DIAGNOSIS — D508 Other iron deficiency anemias: Secondary | ICD-10-CM

## 2022-12-29 DIAGNOSIS — N1832 Chronic kidney disease, stage 3b: Secondary | ICD-10-CM

## 2022-12-29 DIAGNOSIS — S88112S Complete traumatic amputation at level between knee and ankle, left lower leg, sequela: Secondary | ICD-10-CM

## 2022-12-29 DIAGNOSIS — J189 Pneumonia, unspecified organism: Secondary | ICD-10-CM

## 2022-12-29 DIAGNOSIS — G20A1 Parkinson's disease without dyskinesia, without mention of fluctuations: Secondary | ICD-10-CM

## 2022-12-29 DIAGNOSIS — R600 Localized edema: Secondary | ICD-10-CM

## 2022-12-29 DIAGNOSIS — R7303 Prediabetes: Secondary | ICD-10-CM

## 2022-12-29 DIAGNOSIS — I10 Essential (primary) hypertension: Secondary | ICD-10-CM

## 2022-12-29 DIAGNOSIS — R051 Acute cough: Secondary | ICD-10-CM

## 2022-12-29 MED ORDER — ZINC GLUCONATE 50 MG PO TABS
50.0000 mg | ORAL_TABLET | Freq: Every day | ORAL | Status: AC
Start: 2022-12-29 — End: 2023-01-05

## 2022-12-29 MED ORDER — DOXYCYCLINE HYCLATE 100 MG PO TABS: 100.0000 mg | ORAL_TABLET | Freq: Two times a day (BID) | ORAL | Status: AC

## 2022-12-29 NOTE — Progress Notes (Signed)
Location:   [facility]  Nursing Home Room Number: 27-A Place of Service:  SNF [phone]) Provider:  Hazle Nordmann, NP  PCP: [name], MD  Patient Care Team: [name], MD as PCP - General (Internal Medicine) [name], Octaviano Batty, DO as Consulting Physician (Neurology)  Extended Emergency Contact Information Primary Emergency Contact: [place]field,jane Address: [address] [place].          apt [phone] [place] of [place] Home Phone: [phone] Mobile Phone: [phone] Relation: Spouse Secondary Emergency Contact: Mazurek,kenneth Home Phone: [phone] Mobile Phone: [phone] Relation: Son  Code Status:  FULL CODE Goals of care: Advanced Directive information    12/29/2022   10:42 AM  Advanced Directives  Does Patient Have a Medical Advance Directive? Yes  Type of Estate agent of [place];Living will  Does patient want to make changes to medical advance directive? No - Patient declined  Copy of Healthcare Power of Attorney in Chart? Yes - validated most recent copy scanned in chart (See row information)     Chief Complaint  Patient presents with   Medical Management of Chronic Issues    Routine Visit.    Immunizations    Discuss the need for Influenza vaccine, and Covid Booster.     HPI:  Pt is a 89 y.o. male seen today for medical management of chronic diseases.    Acute cough- intermittent cough x 2 days> sometimes productive, h/o CHF, wife recently sick with nasal congestion and cough, covid/flu- negative, temp 97.0 RLE edema- LV EF 60-65% 02/20/2021, improved since amputation, compression hose to right leg daily, 09/06 increased edema> resolved with torsemide 20 mg x 2 days Parkinson's-  diagnosed 2019, followed by Dr. [place] Leas, he is napping more during day per wife, recent BIMS 15/15 (08/12), remains on sinemet S/p left BKA- surgery 03/29 by Dr. Lajoyce Corners due to ongoing osteomyelitis (+MRSA) of left heel, no phantom pain, uses  prosthesis to ambulate/ wheelchair for long distances HTN- BUN/creat 34/1.0 09/05/2022, coreg discontinued due to hypotension, see trends below CKD 3b- see above Prediabetes-  A1c 6.0 (05/31)> was 5.8 01/16/2022 Iron deficiency anemia- hgb 13.4 09/05/2022, remains on ferrous sulfate M/W/F  No recent falls or injuries.   Recent weights:  09/03- 190.5 lbs  08/02- 08/02- 185.7 lbs  09/03- 189.6 lbs   Recent blood pressures:  09/17- 101/64  09/10- 122/80  09/03- 128/79   Past Medical History:  Diagnosis Date   Acute bronchiolitis    Acute osteomyelitis of calcaneum, left (HCC) 01/11/2021   Anemia    Bursitis of both hips    Complication of anesthesia    wife states pt was a little "loopy" for several days   Dementia (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   Hearing reduced    hearing aid   History of basal cell carcinoma    Hyperlipidemia    Malaise and fatigue    MRSA infection 01/11/2021   Osteoarthritis    hand   Osteoporosis    Palpitation    Parkinson's disease    Peripheral vascular disease (HCC)    blood clots   Pre-diabetes    Vitamin D deficiency    Past Surgical History:  Procedure Laterality Date   ABDOMINAL AORTOGRAM W/LOWER EXTREMITY N/A 06/12/2021   Procedure: ABDOMINAL AORTOGRAM W/LOWER EXTREMITY;  Surgeon: Iran Ouch, MD;  Location: [facility];  Service: Cardiovascular;  Laterality: N/A;   AMPUTATION Left 07/03/2021   Procedure: LEFT BELOW KNEE  AMPUTATION;  Surgeon: Nadara Mustard, MD;  Location: [facility] OR;  Service: Orthopedics;  Laterality: Left;   CATARACT EXTRACTION  05/31/2015   Eye/right   COLONOSCOPY  02/05/2005, 03/06/2005   normal, 10 years ago   MOHS micrographic surgery     Face surgery   ORIF ACETABULAR FRACTURE Right 10/09/2020   Procedure: OPEN REDUCTION INTERNAL FIXATION (ORIF) ACETABULAR FRACTURE;  Surgeon: [name], MD;  Location: [facility] OR;  Service: Orthopedics;  Laterality: Right;    No Known Allergies  Allergies as of  12/29/2022   No Known Allergies      Medication List        Accurate as of December 29, 2022 10:43 AM. If you have any questions, ask your nurse or doctor.          acetaminophen 500 MG tablet Commonly known as: TYLENOL Take 1,000 mg by mouth 3 (three) times daily as needed for mild pain or moderate pain.   albuterol 108 (90 Base) MCG/ACT inhaler Commonly known as: VENTOLIN HFA Inhale 2 puffs into the lungs every 4 (four) hours as needed for wheezing or shortness of breath.   ARTIFICIAL TEARS OP Place 1 drop into both eyes in the morning and at bedtime.   ascorbic acid 500 MG tablet Commonly known as: VITAMIN C Take 500 mg by mouth daily.   calcium carbonate 1500 (600 Ca) MG Tabs tablet Commonly known as: OSCAL Take 600 mg of elemental calcium by mouth daily with breakfast.   carbidopa-levodopa 25-100 MG tablet Commonly known as: SINEMET IR Take 1 tablet by mouth in the morning, at noon, in the evening, and at bedtime. 06:00 AM, 12:00 PM, 06:00 PM, 12:00 AM   carbidopa-levodopa 50-200 MG tablet Commonly known as: SINEMET CR Take 1 tablet by mouth at bedtime.   cyanocobalamin 1000 MCG tablet Commonly known as: VITAMIN B12 Take 1,000 mcg by mouth daily.   feeding supplement (PRO-STAT SUGAR FREE 64) Liqd Take 30 mLs by mouth in the morning and at bedtime.   ferrous sulfate 325 (65 FE) MG tablet Take 325 mg by mouth every Monday, Wednesday, and Friday.   GUAIFENESIN PO Take 10 mLs by mouth every 6 (six) hours as needed.   guaiFENesin-codeine 100-10 MG/5ML syrup Commonly known as: ROBITUSSIN AC Take 5 mLs by mouth every 6 (six) hours as needed for cough.   nystatin ointment Commonly known as: MYCOSTATIN Apply 1 Application topically every 12 (twelve) hours as needed.   polyethylene glycol 17 g packet Commonly known as: MIRALAX / GLYCOLAX Take 17 g by mouth daily.   PRESERVISION AREDS 2 PO Take 1 capsule by mouth in the morning and at bedtime.   senna  8.6 MG tablet Commonly known as: SENOKOT Take 1 tablet by mouth daily.   torsemide 20 MG tablet Commonly known as: DEMADEX Take 20 mg by mouth 3 (three) times a week.   Vitamin D (Cholecalciferol) 25 MCG (1000 UT) Tabs Take 1,000 Units by mouth daily.   zinc oxide 20 % ointment Apply 1 application. topically See admin instructions. To buttocks after every incontinent episode and as needed for redness.        Review of Systems  Constitutional:  Positive for chills. Negative for activity change, appetite change and fever.  HENT:  Positive for rhinorrhea. Negative for ear pain, sinus pressure, sinus pain and trouble swallowing.   Eyes:  Negative for visual disturbance.  Respiratory:  Positive for cough. Negative for shortness of breath and wheezing.   Cardiovascular:  Positive for leg swelling. Negative for chest pain.  Gastrointestinal:  Negative for abdominal distention, abdominal pain, nausea and vomiting.  Genitourinary:  Negative for dysuria and frequency.  Musculoskeletal:  Positive for gait problem. Negative for myalgias.  Skin:  Negative for wound.  Neurological:  Positive for weakness. Negative for dizziness and headaches.  Psychiatric/Behavioral:  Positive for confusion. Negative for dysphoric mood and sleep disturbance. The patient is not nervous/anxious.     Immunization History  Administered Date(s) Administered   Fluad Quad(high Dose 65+) 01/29/2022   H1N1 06/13/2008   Influenza Split 03/05/2006, 02/26/2007, 01/26/2008, 01/18/2009, 12/12/2009, 01/16/2011, 01/29/2012, 02/01/2013, 02/08/2014, 02/13/2015, 01/10/2016, 02/23/2017, 02/24/2018, 01/24/2019, 12/22/2019   Influenza, High Dose Seasonal PF 01/30/2021   Moderna SARS-COV2 Booster Vaccination 09/18/2021   PFIZER(Purple Top)SARS-COV-2 Vaccination 05/04/2019, 05/25/2019, 09/01/2020, 02/11/2022   Pfizer Covid-19 Vaccine Bivalent Booster 45yrs & up 12/27/2019, 12/26/2020   Pfizer(Comirnaty)Fall Seasonal Vaccine 12  years and older 02/11/2022   Pneumococcal Conjugate-13 02/10/2014   Pneumococcal Polysaccharide-23 06/10/2011   Tdap 06/05/2022   Zoster Recombinant(Shingrix) 07/04/2009, 03/24/2022   Pertinent  Health Maintenance Due  Topic Date Due   INFLUENZA VACCINE  11/06/2022      06/30/2022   11:40 AM 07/31/2022    2:46 PM 10/03/2022    2:02 PM 10/30/2022    2:50 PM 12/01/2022    9:07 AM  Fall Risk  Falls in the past year? 0 0 0 0 0  Was there an injury with Fall? 0 0 0 0 0  Fall Risk Category Calculator 0 0 0 0 0  Patient at Risk for Falls Due to History of fall(s);Impaired balance/gait;Impaired mobility No Fall Risks History of fall(s);Impaired balance/gait;Impaired mobility No Fall Risks History of fall(s);Impaired balance/gait;Impaired mobility  Fall risk Follow up Falls evaluation completed;Education provided;Falls prevention discussed Falls evaluation completed Falls evaluation completed;Education provided;Falls prevention discussed Falls evaluation completed Falls evaluation completed;Education provided;Falls prevention discussed   Functional Status Survey:    Vitals:   12/29/22 1032  BP: 101/64  Pulse: 79  Resp: 17  Temp: 97.7 F (36.5 C)  SpO2: 96%  Weight: 190 lb 8 oz (86.4 kg)  Height: 5\' 10"  (1.778 m)   Body mass index is 27.33 kg/m. Physical Exam Vitals reviewed.  Constitutional:      General: He is not in acute distress. HENT:     Head: Normocephalic.     Right Ear: There is no impacted cerumen.     Left Ear: There is no impacted cerumen.     Nose: Congestion and rhinorrhea present.     Mouth/Throat:     Mouth: Mucous membranes are moist.     Pharynx: No posterior oropharyngeal erythema.  Eyes:     General:        Right eye: No discharge.        Left eye: No discharge.  Cardiovascular:     Rate and Rhythm: Normal rate and regular rhythm.     Pulses: Normal pulses.     Heart sounds: Murmur heard.  Pulmonary:     Effort: Pulmonary effort is normal.      Breath sounds: Wheezing and rhonchi present. No rales.  Abdominal:     General: Bowel sounds are normal.     Palpations: Abdomen is soft.  Musculoskeletal:     Cervical back: Neck supple.     Right lower leg: No edema.     Comments: Left BKA  Skin:    General: Skin is warm.     Capillary Refill: Capillary  refill takes less than 2 seconds.  Neurological:     General: No focal deficit present.     Mental Status: He is alert and oriented to person, place, and time.     Motor: Weakness present.     Gait: Gait abnormal.     Comments: Sit/stand lift, wheelchair  Psychiatric:        Mood and Affect: Mood normal.    Labs reviewed: Recent Labs    03/03/22 0000 09/05/22 0000  NA 140 138  K 4.5 4.3  CL 104 103  CO2 31* 28*  BUN 38* 34*  CREATININE 1.2 1.0  CALCIUM 8.8 8.9   Recent Labs    03/03/22 0000 09/05/22 0000  AST 12* 13*  ALT 5* 7*  ALKPHOS 61 73  ALBUMIN 3.5 3.8   Recent Labs    03/03/22 0000 09/05/22 0000  WBC 5.2 6.0  NEUTROABS 2,938.00  --   HGB 12.5* 13.4*  HCT 38* 40*  PLT 184 176   Lab Results  Component Value Date   TSH 3.07 09/05/2022   Lab Results  Component Value Date   HGBA1C 6.0 09/05/2022   No results found for: "CHOL", "HDL", "LDLCALC", "LDLDIRECT", "TRIG", "CHOLHDL"  Significant Diagnostic Results in last 30 days:  No results found.  Assessment/Plan 1. Acute cough - started 2 days ago - sometimes productive - wife currently with same symptoms> ? URI - h/o CHF - rhonchi/ wheezing on exam - covid/flu negative - CXR> left lower lobe infiltrate> doxycycline started by on call provider - start vitamin C 1000 mg po QAM x 7 days - start Zinc 50 mg po QAM x 7 days  2. Lower extremity edema - 09/06 pitting edema> resolved with torsemide 20 mg QAM x 2 days - cont torsemide 20 mg 3x/week  3. Parkinson's disease without fluctuating manifestations, unspecified whether dyskinesia present - followed by Dr. [place] Leas - cont carbidopa  4.  Below-knee amputation of left lower extremity, sequela (HCC) - cont skilled nursing  5. Primary hypertension - controlled without medication  6. Stage 3b chronic kidney disease (HCC) - encourage hydration with water - avoid NSAIDS  7. Prediabetes - A1c stable - diet controlled  8. Iron deficiency anemia secondary to inadequate dietary iron intake - hgb stable - cont ferrous sulfate    Family/ staff Communication: plan discussed with patient and nurse  Labs/tests ordered: covid/flu, CXR

## 2022-12-29 NOTE — Telephone Encounter (Signed)
Nurse called to report CXR results showing left lower lobe infiltrate.  Started Doxycycline.

## 2022-12-30 ENCOUNTER — Encounter: Payer: Self-pay | Admitting: Adult Health

## 2022-12-30 ENCOUNTER — Non-Acute Institutional Stay (SKILLED_NURSING_FACILITY): Payer: Medicare Other | Admitting: Adult Health

## 2022-12-30 DIAGNOSIS — J189 Pneumonia, unspecified organism: Secondary | ICD-10-CM | POA: Diagnosis not present

## 2022-12-30 NOTE — Progress Notes (Signed)
Location:  [facility] Room Number: 27-A Place of Service:  SNF (31) Provider:  Kenard Gower, DNP, FNP-BC  Patient Care Team: [name], MD as PCP - General (Internal Medicine) [name], Octaviano Batty, DO as Consulting Physician (Neurology)  Extended Emergency Contact Information Primary Emergency Contact: Thurston,jane Address: [address] [place].          apt [phone]  [phone] Darden Amber of [place] Home Phone: [phone] Mobile Phone: [phone] Relation: Spouse Secondary Emergency Contact: Siess,kenneth Home Phone: [phone] Mobile Phone: [phone] Relation: Son  Code Status:  Full Code  Goals of care: Advanced Directive information    12/30/2022    9:58 AM  Advanced Directives  Does Patient Have a Medical Advance Directive? Yes  Type of Estate agent of [place];Living will  Does patient want to make changes to medical advance directive? No - Patient declined  Copy of Healthcare Power of Attorney in Chart? Yes - validated most recent copy scanned in chart (See row information)     Chief Complaint  Patient presents with   Acute Visit    abnormal chest x-ray    HPI:  Pt is a 89 y.o. male seen today for an acute visit for an abnormal chest x-ray. He is a long-term care resident of [facility]. He has productive cough X 2 days. Chest x-ray done which showed pulmonary infiltrate in the left lung base. A small left pleural effusion was present. He denies shortness of breath, no wheezing. Noted to have minimal expiratory rhonchi.    Past Medical History:  Diagnosis Date   Acute bronchiolitis    Acute osteomyelitis of calcaneum, left (HCC) 01/11/2021   Anemia    Bursitis of both hips    Complication of anesthesia    wife states pt was a little "loopy" for several days   Dementia (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   Hearing reduced    hearing aid   History of basal cell carcinoma    Hyperlipidemia     Malaise and fatigue    MRSA infection 01/11/2021   Osteoarthritis    hand   Osteoporosis    Palpitation    Parkinson's disease    Peripheral vascular disease (HCC)    blood clots   Pre-diabetes    Vitamin D deficiency    Past Surgical History:  Procedure Laterality Date   ABDOMINAL AORTOGRAM W/LOWER EXTREMITY N/A 06/12/2021   Procedure: ABDOMINAL AORTOGRAM W/LOWER EXTREMITY;  Surgeon: Iran Ouch, MD;  Location: [facility] INVASIVE CV LAB;  Service: Cardiovascular;  Laterality: N/A;   AMPUTATION Left 07/03/2021   Procedure: LEFT BELOW KNEE AMPUTATION;  Surgeon: Nadara Mustard, MD;  Location: [facility] OR;  Service: Orthopedics;  Laterality: Left;   CATARACT EXTRACTION  05/31/2015   Eye/right   COLONOSCOPY  02/05/2005, 03/06/2005   normal, 10 years ago   MOHS micrographic surgery     Face surgery   ORIF ACETABULAR FRACTURE Right 10/09/2020   Procedure: OPEN REDUCTION INTERNAL FIXATION (ORIF) ACETABULAR FRACTURE;  Surgeon: [name], MD;  Location: [facility] OR;  Service: Orthopedics;  Laterality: Right;    No Known Allergies  Outpatient Encounter Medications as of 12/30/2022  Medication Sig   acetaminophen (TYLENOL) 500 MG tablet Take 1,000 mg by mouth 3 (three) times daily as needed for mild pain or moderate pain.   albuterol (VENTOLIN HFA) 108 (90 Base) MCG/ACT inhaler Inhale 2 puffs into the lungs every 4 (four) hours as needed  for wheezing or shortness of breath.   Amino Acids-Protein Hydrolys (FEEDING SUPPLEMENT, PRO-STAT SUGAR FREE 64,) LIQD Take 30 mLs by mouth in the morning and at bedtime.   ascorbic acid (VITAMIN C) 500 MG tablet Take 1,000 mg by mouth daily.   calcium carbonate (OSCAL) 1500 (600 Ca) MG TABS tablet Take 600 mg of elemental calcium by mouth daily with breakfast.   carbidopa-levodopa (SINEMET CR) 50-200 MG tablet Take 1 tablet by mouth at bedtime.   carbidopa-levodopa (SINEMET IR) 25-100 MG tablet Take 1 tablet by mouth in the morning, at noon, in the evening, and at  bedtime. 06:00 AM, 12:00 PM, 06:00 PM, 12:00 AM   Carboxymethylcellulose Sodium (ARTIFICIAL TEARS OP) Place 1 drop into both eyes in the morning and at bedtime.   doxycycline (VIBRA-TABS) 100 MG tablet Take 1 tablet (100 mg total) by mouth 2 (two) times daily for 7 days.   ferrous sulfate 325 (65 FE) MG tablet Take 325 mg by mouth every Monday, Wednesday, and Friday.   GUAIFENESIN PO Take 10 mLs by mouth every 6 (six) hours as needed.   guaiFENesin-codeine (ROBITUSSIN AC) 100-10 MG/5ML syrup Take 5 mLs by mouth every 6 (six) hours as needed for cough.   Multiple Vitamins-Minerals (PRESERVISION AREDS 2 PO) Take 1 capsule by mouth in the morning and at bedtime.   nystatin ointment (MYCOSTATIN) Apply 1 Application topically every 12 (twelve) hours as needed.   polyethylene glycol (MIRALAX / GLYCOLAX) 17 g packet Take 17 g by mouth daily.   senna (SENOKOT) 8.6 MG tablet Take 1 tablet by mouth daily.   torsemide (DEMADEX) 20 MG tablet Take 20 mg by mouth 3 (three) times a week.   vitamin B-12 (CYANOCOBALAMIN) 1000 MCG tablet Take 1,000 mcg by mouth daily.   Vitamin D, Cholecalciferol, 25 MCG (1000 UT) TABS Take 1,000 Units by mouth daily.   zinc gluconate 50 MG tablet Take 1 tablet (50 mg total) by mouth daily for 7 days.   zinc oxide 20 % ointment Apply 1 application. topically See admin instructions. To buttocks after every incontinent episode and as needed for redness.   No facility-administered encounter medications on file as of 12/30/2022.    Review of Systems  Constitutional:  Negative for activity change, appetite change and fever.  HENT:  Negative for sore throat.   Eyes: Negative.   Respiratory:  Positive for cough. Negative for shortness of breath and wheezing.   Cardiovascular:  Negative for chest pain and leg swelling.  Gastrointestinal:  Negative for abdominal distention, diarrhea and vomiting.  Genitourinary:  Negative for dysuria, frequency and urgency.  Skin:  Negative for color  change.  Neurological:  Negative for dizziness and headaches.  Psychiatric/Behavioral:  Negative for behavioral problems and sleep disturbance. The patient is not nervous/anxious.        Immunization History  Administered Date(s) Administered   Fluad Quad(high Dose 65+) 01/29/2022   H1N1 06/13/2008   Influenza Split 03/05/2006, 02/26/2007, 01/26/2008, 01/18/2009, 12/12/2009, 01/16/2011, 01/29/2012, 02/01/2013, 02/08/2014, 02/13/2015, 01/10/2016, 02/23/2017, 02/24/2018, 01/24/2019, 12/22/2019   Influenza, High Dose Seasonal PF 01/30/2021   Moderna SARS-COV2 Booster Vaccination 09/18/2021   PFIZER(Purple Top)SARS-COV-2 Vaccination 05/04/2019, 05/25/2019, 09/01/2020, 02/11/2022   Pfizer Covid-19 Vaccine Bivalent Booster 24yrs & up 12/27/2019, 12/26/2020   Pfizer(Comirnaty)Fall Seasonal Vaccine 12 years and older 02/11/2022   Pneumococcal Conjugate-13 02/10/2014   Pneumococcal Polysaccharide-23 06/10/2011   Tdap 06/05/2022   Zoster Recombinant(Shingrix) 07/04/2009, 03/24/2022   Pertinent  Health Maintenance Due  Topic Date Due  INFLUENZA VACCINE  11/06/2022      06/30/2022   11:40 AM 07/31/2022    2:46 PM 10/03/2022    2:02 PM 10/30/2022    2:50 PM 12/01/2022    9:07 AM  Fall Risk  Falls in the past year? 0 0 0 0 0  Was there an injury with Fall? 0 0 0 0 0  Fall Risk Category Calculator 0 0 0 0 0  Patient at Risk for Falls Due to History of fall(s);Impaired balance/gait;Impaired mobility No Fall Risks History of fall(s);Impaired balance/gait;Impaired mobility No Fall Risks History of fall(s);Impaired balance/gait;Impaired mobility  Fall risk Follow up Falls evaluation completed;Education provided;Falls prevention discussed Falls evaluation completed Falls evaluation completed;Education provided;Falls prevention discussed Falls evaluation completed Falls evaluation completed;Education provided;Falls prevention discussed     Vitals:   12/30/22 0949  BP: 101/64  Pulse: 79  Resp: 17   Temp: 97.7 F (36.5 C)  SpO2: 96%  Weight: 190 lb 8 oz (86.4 kg)  Height: 5\' 10"  (1.778 m)   Body mass index is 27.33 kg/m.  Physical Exam Constitutional:      Appearance: Normal appearance.  HENT:     Head: Normocephalic and atraumatic.     Mouth/Throat:     Mouth: Mucous membranes are moist.  Eyes:     Conjunctiva/sclera: Conjunctivae normal.  Cardiovascular:     Rate and Rhythm: Normal rate and regular rhythm.     Pulses: Normal pulses.     Heart sounds: Normal heart sounds.  Pulmonary:     Effort: Pulmonary effort is normal.     Breath sounds: Rhonchi present.  Abdominal:     General: Bowel sounds are normal.     Palpations: Abdomen is soft.  Musculoskeletal:        General: No swelling.     Cervical back: Normal range of motion.     Comments: Left BKA  Skin:    General: Skin is warm and dry.  Neurological:     General: No focal deficit present.     Mental Status: He is alert and oriented to person, place, and time.  Psychiatric:        Mood and Affect: Mood normal.        Behavior: Behavior normal.        Thought Content: Thought content normal.        Judgment: Judgment normal.        Labs reviewed: Recent Labs    03/03/22 0000 09/05/22 0000  NA 140 138  K 4.5 4.3  CL 104 103  CO2 31* 28*  BUN 38* 34*  CREATININE 1.2 1.0  CALCIUM 8.8 8.9   Recent Labs    03/03/22 0000 09/05/22 0000  AST 12* 13*  ALT 5* 7*  ALKPHOS 61 73  ALBUMIN 3.5 3.8   Recent Labs    03/03/22 0000 09/05/22 0000  WBC 5.2 6.0  NEUTROABS 2,938.00  --   HGB 12.5* 13.4*  HCT 38* 40*  PLT 184 176   Lab Results  Component Value Date   TSH 3.07 09/05/2022   Lab Results  Component Value Date   HGBA1C 6.0 09/05/2022   No results found for: "CHOL", "HDL", "LDLCALC", "LDLDIRECT", "TRIG", "CHOLHDL"  Significant Diagnostic Results in last 30 days:  No results found.  Assessment/Plan  1. Pneumonia of left lower lobe due to infectious organism - continue  Doxycycline 100 mg BID X 7 days, started by on-call provider   Family/ staff Communication: Discussed plan of care  with resident and charge nurse.  Labs/tests ordered:     Kenard Gower, DNP, MSN, FNP-BC [facility] [phone] (Monday-Friday 8:00 a.m. - 5:00 p.m.) [phone] (after hours)

## 2023-01-15 LAB — BASIC METABOLIC PANEL
BUN: 25 — AB (ref 4–21)
CO2: 28 — AB (ref 13–22)
Chloride: 99 (ref 99–108)
Creatinine: 1 (ref 0.6–1.3)
Glucose: 84
Potassium: 4.3 meq/L (ref 3.5–5.1)
Sodium: 135 — AB (ref 137–147)

## 2023-01-15 LAB — COMPREHENSIVE METABOLIC PANEL
Calcium: 8.6 — AB (ref 8.7–10.7)
eGFR: 71

## 2023-01-22 ENCOUNTER — Encounter: Payer: Self-pay | Admitting: Internal Medicine

## 2023-01-22 ENCOUNTER — Non-Acute Institutional Stay (SKILLED_NURSING_FACILITY): Payer: Self-pay | Admitting: Internal Medicine

## 2023-01-22 DIAGNOSIS — G20A1 Parkinson's disease without dyskinesia, without mention of fluctuations: Secondary | ICD-10-CM | POA: Diagnosis not present

## 2023-01-22 DIAGNOSIS — N1832 Chronic kidney disease, stage 3b: Secondary | ICD-10-CM | POA: Diagnosis not present

## 2023-01-22 DIAGNOSIS — S88112S Complete traumatic amputation at level between knee and ankle, left lower leg, sequela: Secondary | ICD-10-CM | POA: Diagnosis not present

## 2023-01-22 DIAGNOSIS — I10 Essential (primary) hypertension: Secondary | ICD-10-CM

## 2023-01-22 DIAGNOSIS — R600 Localized edema: Secondary | ICD-10-CM

## 2023-01-22 DIAGNOSIS — D508 Other iron deficiency anemias: Secondary | ICD-10-CM

## 2023-01-22 NOTE — Progress Notes (Signed)
Location:  [facility] Room Number: N27-A Place of Service:  SNF [phone]) Provider:  [name], MD  [name], MD  Patient Care Team: [name], MD as PCP - General (Internal Medicine) [name], Octaviano Batty, DO as Consulting Physician (Neurology)  Extended Emergency Contact Information Primary Emergency Contact: Etchison,jane Address: [address] [place].          apt [phone] Darden Amber of [place] Home Phone: [phone] Mobile Phone: [phone] Relation: Spouse Secondary Emergency Contact: Hilligoss,kenneth Home Phone: [phone] Mobile Phone: [phone] Relation: Son  Code Status:  Full Code Goals of care: Advanced Directive information    01/22/2023    2:57 PM  Advanced Directives  Does Patient Have a Medical Advance Directive? Yes  Type of Advance Directive Living will  Does patient want to make changes to medical advance directive? No - Patient declined  Copy of Healthcare Power of Attorney in Chart? Yes - validated most recent copy scanned in chart (See row information)     Chief Complaint  Patient presents with   Medical Management of Chronic Issues    HPI:  Pt is a 89 y.o. male seen today for medical management of chronic diseases.    Lives in SNF   Patient is s/p Left BKA for Left Osteomyelitis    h/o Parkinson disease Follows with Dr [name] Diagnosed in 2019 in [place]   h/o Osteoporosis , Vit D def Has been on Fosamax before, BPH, Arthritis,   right femur fracture followed by acute DVT of right leg.  Recently had Pneumonia Now feeling much better Still has some cough Works with [employer] Was Little concerned about his wife who is getting surgery today  No Other complains Stays on Wheelchair CNAS are able to get him up mostly without the machine Also able to go the bathroom with gait belt Does sometimes have phantom leg in his BKA side Wt Readings from Last 3 Encounters:  01/22/23 187 lb 8 oz (85  kg)  12/30/22 190 lb 8 oz (86.4 kg)  12/29/22 190 lb 8 oz (86.4 kg)    Past Medical History:  Diagnosis Date   Acute bronchiolitis    Acute osteomyelitis of calcaneum, left (HCC) 01/11/2021   Anemia    Bursitis of both hips    Complication of anesthesia    wife states pt was a little "loopy" for several days   Dementia (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   Hearing reduced    hearing aid   History of basal cell carcinoma    Hyperlipidemia    Malaise and fatigue    MRSA infection 01/11/2021   Osteoarthritis    hand   Osteoporosis    Palpitation    Parkinson's disease (HCC)    Peripheral vascular disease (HCC)    blood clots   Pre-diabetes    Vitamin D deficiency    Past Surgical History:  Procedure Laterality Date   ABDOMINAL AORTOGRAM W/LOWER EXTREMITY N/A 06/12/2021   Procedure: ABDOMINAL AORTOGRAM W/LOWER EXTREMITY;  Surgeon: Iran Ouch, MD;  Location: [facility] INVAS[facility] CV LAB;  Service: Cardiovascular;  Laterality: N/A;   AMPUTATION Left 07/03/2021   Procedure: LEFT BELOW KNEE AMPUTATION;  Surgeon: Nadara Mustard, MD;  Location: [facility] OR;  Service: Orthopedics;  Laterality: Left;   CATARACT EXTRACTION  05/31/2015   Eye/right   COLONOSCOPY  02/05/2005, 03/06/2005   normal, 10 years ago   MOHS micrographic surgery     Face  surgery   ORIF ACETABULAR FRACTURE Right 10/09/2020   Procedure: OPEN REDUCTION INTERNAL FIXATION (ORIF) ACETABULAR FRACTURE;  Surgeon: [name], MD;  Location: [facility] OR;  Service: Orthopedics;  Laterality: Right;    No Known Allergies  Outpatient Encounter Medications as of 01/22/2023  Medication Sig   acetaminophen (TYLENOL) 500 MG tablet Take 1,000 mg by mouth 3 (three) times daily as needed for mild pain or moderate pain.   albuterol (VENTOLIN HFA) 108 (90 Base) MCG/ACT inhaler Inhale 2 puffs into the lungs every 4 (four) hours as needed for wheezing or shortness of breath.   Ascorbic Acid (VITAMIN C) 1000 MG tablet Take 1,000 mg by mouth daily.    calcium carbonate (OSCAL) 1500 (600 Ca) MG TABS tablet Take 600 mg of elemental calcium by mouth daily with breakfast.   carbidopa-levodopa (SINEMET CR) 50-200 MG tablet Take 1 tablet by mouth at bedtime.   carbidopa-levodopa (SINEMET IR) 25-100 MG tablet Take 1 tablet by mouth in the morning, at noon, in the evening, and at bedtime. 06:00 AM, 12:00 PM, 06:00 PM, 12:00 AM   Carboxymethylcellulose Sodium (ARTIFICIAL TEARS OP) Place 1 drop into both eyes in the morning and at bedtime.   ferrous sulfate 325 (65 FE) MG tablet Take 325 mg by mouth every Monday, Wednesday, and Friday.   GUAIFENESIN PO Take 10 mLs by mouth every 6 (six) hours as needed.   Multiple Vitamins-Minerals (PRESERVISION AREDS 2 PO) Take 1 capsule by mouth in the morning and at bedtime.   nystatin (MYCOSTATIN/NYSTOP) powder Apply 1 Application topically 3 (three) times daily.   polyethylene glycol (MIRALAX / GLYCOLAX) 17 g packet Take 17 g by mouth daily.   senna (SENOKOT) 8.6 MG tablet Take 1 tablet by mouth daily.   torsemide (DEMADEX) 20 MG tablet Take 20 mg by mouth 3 (three) times a week.   vitamin B-12 (CYANOCOBALAMIN) 1000 MCG tablet Take 1,000 mcg by mouth daily.   Vitamin D, Cholecalciferol, 25 MCG (1000 UT) TABS Take 1,000 Units by mouth daily.   zinc oxide 20 % ointment Apply 1 application. topically See admin instructions. To buttocks after every incontinent episode and as needed for redness.   [DISCONTINUED] Amino Acids-Protein Hydrolys (FEEDING SUPPLEMENT, PRO-STAT SUGAR FREE 64,) LIQD Take 30 mLs by mouth in the morning and at bedtime.   [DISCONTINUED] guaiFENesin-codeine (ROBITUSSIN AC) 100-10 MG/5ML syrup Take 5 mLs by mouth every 6 (six) hours as needed for cough.   [DISCONTINUED] nystatin ointment (MYCOSTATIN) Apply 1 Application topically every 12 (twelve) hours as needed.   No facility-administered encounter medications on file as of 01/22/2023.    Review of Systems  Constitutional:  Negative for activity  change, appetite change and unexpected weight change.  HENT: Negative.    Respiratory:  Negative for cough and shortness of breath.   Cardiovascular:  Negative for leg swelling.  Gastrointestinal:  Negative for constipation.  Genitourinary:  Negative for frequency.  Musculoskeletal:  Positive for arthralgias and gait problem. Negative for myalgias.  Skin: Negative.  Negative for rash.  Neurological:  Negative for dizziness and weakness.  Psychiatric/Behavioral:  Negative for confusion and sleep disturbance.   All other systems reviewed and are negative.   Immunization History  Administered Date(s) Administered   Fluad Quad(high Dose 65+) 01/29/2022   H1N1 06/13/2008   Influenza Split 03/05/2006, 02/26/2007, 01/26/2008, 01/18/2009, 12/12/2009, 01/16/2011, 01/29/2012, 02/01/2013, 02/08/2014, 02/13/2015, 01/10/2016, 02/23/2017, 02/24/2018, 01/24/2019, 12/22/2019   Influenza, High Dose Seasonal PF 01/30/2021   Moderna SARS-COV2 Booster Vaccination 09/18/2021  PFIZER(Purple Top)SARS-COV-2 Vaccination 05/04/2019, 05/25/2019, 09/01/2020, 02/11/2022   Pfizer Covid-19 Vaccine Bivalent Booster 45yrs & up 12/27/2019, 12/26/2020   Pfizer(Comirnaty)Fall Seasonal Vaccine 12 years and older 02/11/2022   Pneumococcal Conjugate-13 02/10/2014   Pneumococcal Polysaccharide-23 06/10/2011   Tdap 06/05/2022   Zoster Recombinant(Shingrix) 07/04/2009, 03/24/2022   Pertinent  Health Maintenance Due  Topic Date Due   INFLUENZA VACCINE  11/06/2022      06/30/2022   11:40 AM 07/31/2022    2:46 PM 10/03/2022    2:02 PM 10/30/2022    2:50 PM 12/01/2022    9:07 AM  Fall Risk  Falls in the past year? 0 0 0 0 0  Was there an injury with Fall? 0 0 0 0 0  Fall Risk Category Calculator 0 0 0 0 0  Patient at Risk for Falls Due to History of fall(s);Impaired balance/gait;Impaired mobility No Fall Risks History of fall(s);Impaired balance/gait;Impaired mobility No Fall Risks History of fall(s);Impaired  balance/gait;Impaired mobility  Fall risk Follow up Falls evaluation completed;Education provided;Falls prevention discussed Falls evaluation completed Falls evaluation completed;Education provided;Falls prevention discussed Falls evaluation completed Falls evaluation completed;Education provided;Falls prevention discussed   Functional Status Survey:    Vitals:   01/22/23 1456  BP: (!) 140/79  Pulse: 83  Resp: 18  Temp: (!) 96.2 F (35.7 C)  SpO2: 95%  Weight: 187 lb 8 oz (85 kg)  Height: 5\' 10"  (1.778 m)   Body mass index is 26.9 kg/m. Physical Exam Vitals reviewed.  Constitutional:      Appearance: Normal appearance.  HENT:     Head: Normocephalic.     Nose: Nose normal.     Mouth/Throat:     Mouth: Mucous membranes are moist.     Pharynx: Oropharynx is clear.  Eyes:     Pupils: Pupils are equal, round, and reactive to light.  Cardiovascular:     Rate and Rhythm: Normal rate and regular rhythm.     Pulses: Normal pulses.     Heart sounds: No murmur heard. Pulmonary:     Effort: Pulmonary effort is normal. No respiratory distress.     Breath sounds: Normal breath sounds. No rales.  Abdominal:     General: Abdomen is flat. Bowel sounds are normal.     Palpations: Abdomen is soft.  Musculoskeletal:        General: No swelling.     Cervical back: Neck supple.     Comments: Mild edema in Right leg  Skin:    General: Skin is warm.  Neurological:     General: No focal deficit present.     Mental Status: He is alert and oriented to person, place, and time.     Comments: Stiffness in his Extremeties  Psychiatric:        Mood and Affect: Mood normal.        Thought Content: Thought content normal.     Labs reviewed: Recent Labs    03/03/22 0000 09/05/22 0000 01/15/23 0000  NA 140 138 135*  K 4.5 4.3 4.3  CL 104 103 99  CO2 31* 28* 28*  BUN 38* 34* 25*  CREATININE 1.2 1.0 1.0  CALCIUM 8.8 8.9 8.6*   Recent Labs    03/03/22 0000 09/05/22 0000  AST 12*  13*  ALT 5* 7*  ALKPHOS 61 73  ALBUMIN 3.5 3.8   Recent Labs    03/03/22 0000 09/05/22 0000  WBC 5.2 6.0  NEUTROABS 2,938.00  --   HGB 12.5* 13.4*  HCT  38* 40*  PLT 184 176   Lab Results  Component Value Date   TSH 3.07 09/05/2022   Lab Results  Component Value Date   HGBA1C 6.0 09/05/2022   No results found for: "CHOL", "HDL", "LDLCALC", "LDLDIRECT", "TRIG", "CHOLHDL"  Significant Diagnostic Results in last 30 days:  No results found.  Assessment/Plan 1. Parkinson's disease without fluctuating manifestations, unspecified whether dyskinesia present (HCC) On Sinemet Sees Dr [name] Cognitively doing well  2. Below-knee amputation of left lower extremity, sequela (HCC) Has prosthetics But mostly stays in wheelchair    3. Primary hypertension Off All Meds Loose Control  4. Stage 3b chronic kidney disease (HCC) Creat in good limits  5. Iron deficiency anemia secondary to inadequate dietary iron intake HGB good Iron can be changed to 2/week  6. Lower extremity edema Low dose of Torsemide  7 h/o DVT s/p Hip fracture Took Eliquis for 6 months  Family/ staff Communication:   Labs/tests ordered:

## 2023-02-23 ENCOUNTER — Non-Acute Institutional Stay (SKILLED_NURSING_FACILITY): Payer: Medicare Other | Admitting: Orthopedic Surgery

## 2023-02-23 ENCOUNTER — Encounter: Payer: Self-pay | Admitting: Orthopedic Surgery

## 2023-02-23 DIAGNOSIS — N1832 Chronic kidney disease, stage 3b: Secondary | ICD-10-CM

## 2023-02-23 DIAGNOSIS — G20A1 Parkinson's disease without dyskinesia, without mention of fluctuations: Secondary | ICD-10-CM | POA: Diagnosis not present

## 2023-02-23 DIAGNOSIS — I10 Essential (primary) hypertension: Secondary | ICD-10-CM | POA: Diagnosis not present

## 2023-02-23 DIAGNOSIS — Z89512 Acquired absence of left leg below knee: Secondary | ICD-10-CM | POA: Diagnosis not present

## 2023-02-23 DIAGNOSIS — D508 Other iron deficiency anemias: Secondary | ICD-10-CM

## 2023-02-23 DIAGNOSIS — R600 Localized edema: Secondary | ICD-10-CM | POA: Diagnosis not present

## 2023-02-23 DIAGNOSIS — R7303 Prediabetes: Secondary | ICD-10-CM

## 2023-02-23 NOTE — Progress Notes (Signed)
Location:   [facility]  Nursing Home Room Number: 27-A Place of Service:  SNF [phone]) Provider:  Hazle Nordmann, NP  PCP: [name], MD  Patient Care Team: [name], MD as PCP - General (Internal Medicine) [name], Octaviano Batty, DO as Consulting Physician (Neurology)  Extended Emergency Contact Information Primary Emergency Contact: Thau,jane Address: [address] [place].          apt [phone] Darden Amber of [place] Home Phone: [phone] Mobile Phone: [phone] Relation: Spouse Secondary Emergency Contact: Bochicchio,kenneth Home Phone: [phone] Mobile Phone: [phone] Relation: Son  Code Status:  FULL CODE Goals of care: Advanced Directive information    02/23/2023   10:39 AM  Advanced Directives  Does Patient Have a Medical Advance Directive? Yes  Type of Estate agent of [place];Living will  Does patient want to make changes to medical advance directive? No - Patient declined  Copy of Healthcare Power of Attorney in Chart? Yes - validated most recent copy scanned in chart (See row information)     Chief Complaint  Patient presents with   Medical Management of Chronic Issues    Routine Visit.     HPI:  Pt is a 89 y.o. male seen today for medical management of chronic diseases.    He currently resides on the skilled nursing unit at [facility]. PMH: HTN, HLD, HFpEF, parkinson's, PBA, acute DVT right lower leg 12/27/2020, osteoporosis (past Fosamax use), BPH, CKD, ORIF right acetabular 10/2020, PNA LLL 12/29/2022, and unstable gait.   Parkinson's-  diagnosed 2019, followed by Dr. 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 Lajoyce Corners due to ongoing osteomyelitis (+MRSA) of left heel, no phantom pain, uses prosthesis to ambulate/ wheelchair for long distances RLE- LV EF 60-65% 02/20/2021, improved since amputation,  compression hose to right leg daily, torsemide reduced to 3x/week 05/30 HTN- BUN/creat 25/1.0 01/15/2023, coreg discontinued due to hypotension, see trends below CKD 3b- see above Prediabetes-  A1c 6.0 (05/31)> was 5.8 01/16/2022 Iron deficiency anemia- hgb 13.4 09/05/2022, remains on ferrous sulfate M/W/F  Received flu/covid vaccine beginning of month> tolerated well.   Requesting sessions with Nustep to maintain strength.   Recent blood pressures:  11/12- 153/80  11/05- 118/65  10/29- 115/76  Recent weights:  11/04- 189.6 lbs  10/07- 187.5 lbs  09/03- 190.5 lbs    Past Medical History:  Diagnosis Date   Acute bronchiolitis    Acute osteomyelitis of calcaneum, left (HCC) 01/11/2021   Anemia    Bursitis of both hips    Complication of anesthesia    wife states pt was a little "loopy" for several days   Dementia (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   Hearing reduced    hearing aid   History of basal cell carcinoma    Hyperlipidemia    Malaise and fatigue    MRSA infection 01/11/2021   Osteoarthritis    hand   Osteoporosis    Palpitation    Parkinson's disease (HCC)    Peripheral vascular disease (HCC)    blood clots   Pre-diabetes    Vitamin D deficiency    Past Surgical History:  Procedure Laterality Date   ABDOMINAL AORTOGRAM W/LOWER EXTREMITY N/A 06/12/2021   Procedure: ABDOMINAL AORTOGRAM W/LOWER EXTREMITY;  Surgeon: Iran Ouch, MD;  [facility]: [facility];  Service: Cardiovascular;  Laterality: N/A;  AMPUTATION Left 07/03/2021   Procedure: LEFT BELOW KNEE AMPUTATION;  Surgeon: Nadara Mustard, MD;  Location: [facility] OR;  Service: Orthopedics;  Laterality: Left;   CATARACT EXTRACTION  05/31/2015   Eye/right   COLONOSCOPY  02/05/2005, 03/06/2005   normal, 10 years ago   MOHS micrographic surgery     Face surgery   ORIF ACETABULAR FRACTURE Right 10/09/2020   Procedure: OPEN REDUCTION INTERNAL FIXATION (ORIF) ACETABULAR FRACTURE;  Surgeon: [name], MD;   Location: [facility] OR;  Service: Orthopedics;  Laterality: Right;    No Known Allergies  Allergies as of 02/23/2023   No Known Allergies      Medication List        Accurate as of February 23, 2023 10:39 AM. If you have any questions, ask your nurse or doctor.          acetaminophen 500 MG tablet Commonly known as: TYLENOL Take 1,000 mg by mouth 3 (three) times daily as needed for mild pain or moderate pain.   albuterol 108 (90 Base) MCG/ACT inhaler Commonly known as: VENTOLIN HFA Inhale 2 puffs into the lungs every 4 (four) hours as needed for wheezing or shortness of breath.   ARTIFICIAL TEARS OP Place 1 drop into both eyes in the morning and at bedtime.   calcium carbonate 1500 (600 Ca) MG Tabs tablet Commonly known as: OSCAL Take 600 mg of elemental calcium by mouth daily with breakfast.   carbidopa-levodopa 25-100 MG tablet Commonly known as: SINEMET IR Take 1 tablet by mouth in the morning, at noon, in the evening, and at bedtime. 06:00 AM, 12:00 PM, 06:00 PM, 12:00 AM   carbidopa-levodopa 50-200 MG tablet Commonly known as: SINEMET CR Take 1 tablet by mouth at bedtime.   cyanocobalamin 1000 MCG tablet Commonly known as: VITAMIN B12 Take 1,000 mcg by mouth daily.   ferrous sulfate 325 (65 FE) MG tablet Take 325 mg by mouth 2 (two) times a week. Monday & Thursday   GUAIFENESIN PO Take 10 mLs by mouth every 6 (six) hours as needed.   nystatin powder Commonly known as: MYCOSTATIN/NYSTOP Apply 1 Application topically every 12 (twelve) hours as needed.   polyethylene glycol 17 g packet Commonly known as: MIRALAX / GLYCOLAX Take 17 g by mouth daily.   PRESERVISION AREDS 2 PO Take 1 capsule by mouth in the morning and at bedtime.   senna 8.6 MG tablet Commonly known as: SENOKOT Take 1 tablet by mouth daily.   torsemide 20 MG tablet Commonly known as: DEMADEX Take 20 mg by mouth 3 (three) times a week.   vitamin C 1000 MG tablet Take 1,000 mg by mouth  daily.   Vitamin D (Cholecalciferol) 25 MCG (1000 UT) Tabs Take 1,000 Units by mouth daily.   zinc oxide 20 % ointment Apply 1 application. topically See admin instructions. To buttocks after every incontinent episode and as needed for redness.        Review of Systems  Constitutional:  Negative for activity change and appetite change.  HENT:  Negative for sore throat and trouble swallowing.   Eyes:  Negative for visual disturbance.  Respiratory:  Negative for cough, shortness of breath and wheezing.   Cardiovascular:  Positive for leg swelling. Negative for chest pain.  Gastrointestinal:  Negative for abdominal distention and abdominal pain.  Genitourinary:  Negative for dysuria, frequency and hematuria.  Musculoskeletal:  Positive for gait problem.  Skin:  Negative for wound.  Neurological:  Positive for weakness. Negative for dizziness  and headaches.  Psychiatric/Behavioral:  Negative for confusion, dysphoric mood and sleep disturbance. The patient is not nervous/anxious.     Immunization History  Administered Date(s) Administered   Fluad Quad(high Dose 65+) 01/29/2022   H1N1 06/13/2008   Influenza Split 03/05/2006, 02/26/2007, 01/26/2008, 01/18/2009, 12/12/2009, 01/16/2011, 01/29/2012, 02/01/2013, 02/08/2014, 02/13/2015, 01/10/2016, 02/23/2017, 02/24/2018, 01/24/2019, 12/22/2019   Influenza, High Dose Seasonal PF 01/30/2021, 02/04/2023   Moderna Covid-19 Fall Seasonal Vaccine 40yrs & older 02/11/2023   Moderna SARS-COV2 Booster Vaccination 09/18/2021   PFIZER(Purple Top)SARS-COV-2 Vaccination 05/04/2019, 05/25/2019, 09/01/2020, 02/11/2022   Pfizer Covid-19 Vaccine Bivalent Booster 104yrs & up 12/27/2019, 12/26/2020   Pfizer(Comirnaty)Fall Seasonal Vaccine 12 years and older 02/11/2022   Pneumococcal Conjugate-13 02/10/2014   Pneumococcal Polysaccharide-23 06/10/2011   Tdap 06/05/2022   Zoster Recombinant(Shingrix) 07/04/2009, 03/24/2022   Pertinent  Health Maintenance Due   Topic Date Due   INFLUENZA VACCINE  Completed      06/30/2022   11:40 AM 07/31/2022    2:46 PM 10/03/2022    2:02 PM 10/30/2022    2:50 PM 12/01/2022    9:07 AM  Fall Risk  Falls in the past year? 0 0 0 0 0  Was there an injury with Fall? 0 0 0 0 0  Fall Risk Category Calculator 0 0 0 0 0  Patient at Risk for Falls Due to History of fall(s);Impaired balance/gait;Impaired mobility No Fall Risks History of fall(s);Impaired balance/gait;Impaired mobility No Fall Risks History of fall(s);Impaired balance/gait;Impaired mobility  Fall risk Follow up Falls evaluation completed;Education provided;Falls prevention discussed Falls evaluation completed Falls evaluation completed;Education provided;Falls prevention discussed Falls evaluation completed Falls evaluation completed;Education provided;Falls prevention discussed   Functional Status Survey:    Vitals:   02/23/23 1027  BP: (!) 153/80  Pulse: 83  Resp: 17  Temp: 97.8 F (36.6 C)  SpO2: 95%  Weight: 189 lb 9.6 oz (86 kg)  Height: 5\' 10"  (1.778 m)   Body mass index is 27.2 kg/m. Physical Exam Vitals reviewed.  Constitutional:      General: He is not in acute distress. HENT:     Head: Normocephalic.  Eyes:     General:        Right eye: No discharge.        Left eye: No discharge.  Cardiovascular:     Rate and Rhythm: Normal rate and regular rhythm.     Pulses: Normal pulses.     Heart sounds: Murmur heard.  Pulmonary:     Effort: Pulmonary effort is normal. No respiratory distress.     Breath sounds: Normal breath sounds. No wheezing or rales.  Abdominal:     General: Bowel sounds are normal.     Palpations: Abdomen is soft.  Musculoskeletal:     Cervical back: Neck supple.     Right lower leg: Edema present.     Comments: Non pitting, left BKA  Skin:    General: Skin is warm.     Capillary Refill: Capillary refill takes less than 2 seconds.  Neurological:     General: No focal deficit present.     Mental Status:  He is alert and oriented to person, place, and time.     Motor: Weakness present.     Gait: Gait abnormal.     Labs reviewed: Recent Labs    03/03/22 0000 09/05/22 0000 01/15/23 0000  NA 140 138 135*  K 4.5 4.3 4.3  CL 104 103 99  CO2 31* 28* 28*  BUN 38* 34* 25*  CREATININE 1.2 1.0 1.0  CALCIUM 8.8 8.9 8.6*   Recent Labs    03/03/22 0000 09/05/22 0000  AST 12* 13*  ALT 5* 7*  ALKPHOS 61 73  ALBUMIN 3.5 3.8   Recent Labs    03/03/22 0000 09/05/22 0000  WBC 5.2 6.0  NEUTROABS 2,938.00  --   HGB 12.5* 13.4*  HCT 38* 40*  PLT 184 176   Lab Results  Component Value Date   TSH 3.07 09/05/2022   Lab Results  Component Value Date   HGBA1C 6.0 09/05/2022   No results found for: "CHOL", "HDL", "LDLCALC", "LDLDIRECT", "TRIG", "CHOLHDL"  Significant Diagnostic Results in last 30 days:  No results found.  Assessment/Plan 1. Parkinson's disease without fluctuating manifestations, unspecified whether dyskinesia present (HCC) - followed by Dr. Arbutus Leas - cont carbidopa  - cont skilled nursing   2. S/P below knee amputation, left (HCC) - does well with prosthetic - cont skilled nursing - will order Nustep 3x/weekly to maintain strength  3. Edema of right lower leg - ongoing - cont torsemide  4. Essential hypertension - controlled without medication  5. Stage 3b chronic kidney disease (HCC) - encourage hydration with water - avoid NSAIDS  6. Prediabetes - A1c 6.0 (05/30) - plan to recheck A1c next month  7. Iron deficiency anemia secondary to inadequate dietary iron intake - cont ferrous sulfate 3x/week     Family/ staff Communication: plan discussed with patient, wife and nursing.   Labs/tests ordered:  none

## 2023-03-02 ENCOUNTER — Non-Acute Institutional Stay (SKILLED_NURSING_FACILITY): Payer: Self-pay | Admitting: Orthopedic Surgery

## 2023-03-02 ENCOUNTER — Encounter: Payer: Self-pay | Admitting: Orthopedic Surgery

## 2023-03-02 DIAGNOSIS — G20A1 Parkinson's disease without dyskinesia, without mention of fluctuations: Secondary | ICD-10-CM

## 2023-03-02 DIAGNOSIS — R1030 Lower abdominal pain, unspecified: Secondary | ICD-10-CM | POA: Diagnosis not present

## 2023-03-02 DIAGNOSIS — R197 Diarrhea, unspecified: Secondary | ICD-10-CM | POA: Diagnosis not present

## 2023-03-02 NOTE — Progress Notes (Signed)
Location:  [facility] Room Number: 27/A Place of Service:  SNF [phone]) Provider:  Octavia Heir, NP   [name], MD  Patient Care Team: [name], MD as PCP - General (Internal Medicine) [name], Octaviano Batty, DO as Consulting Physician (Neurology)  Extended Emergency Contact Information Primary Emergency Contact: Grode,jane Address: [address] [place].          apt [phone] Darden Amber of [place] Home Phone: [phone] Mobile Phone: [phone] Relation: Spouse Secondary Emergency Contact: Brau,kenneth Home Phone: [phone] Mobile Phone: [phone] Relation: Son  Code Status:  DNR Goals of care: Advanced Directive information    02/23/2023   10:39 AM  Advanced Directives  Does Patient Have a Medical Advance Directive? Yes  Type of Estate agent of [place];Living will  Does patient want to make changes to medical advance directive? No - Patient declined  Copy of Healthcare Power of Attorney in Chart? Yes - validated most recent copy scanned in chart (See row information)     Chief Complaint  Patient presents with   Acute Visit    Abdominal pain, diarrhea    HPI:  Pt is a 89 y.o. [name] seen today for acute visit due to abdominal pain and diarrhea.   11/24 he had increased lower abdominal pain with diarrhea. Abdominal pain improved after moving bowels. He reports stool was watery. He ate collards with dinner and does not know if that triggered event. Bowel movements have not been regular x 1 week.  Denies N/V, fever or low back pain. He receives miralax and senna daily for constipation. Vitals stable.   No recent falls or injuries. Plan to start Nustep 2x/week 03/2023. Remains on sinemet for parkinson's.   Past Medical History:  Diagnosis Date   Acute bronchiolitis    Acute osteomyelitis of calcaneum, left (HCC) 01/11/2021   Anemia    Bursitis of both hips    Complication of anesthesia    wife  states pt was a little "loopy" for several days   Dementia (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   Hearing reduced    hearing aid   History of basal cell carcinoma    Hyperlipidemia    Malaise and fatigue    MRSA infection 01/11/2021   Osteoarthritis    hand   Osteoporosis    Palpitation    Parkinson's disease (HCC)    Peripheral vascular disease (HCC)    blood clots   Pre-diabetes    Vitamin D deficiency    Past Surgical History:  Procedure Laterality Date   ABDOMINAL AORTOGRAM W/LOWER EXTREMITY N/A 06/12/2021   Procedure: ABDOMINAL AORTOGRAM W/LOWER EXTREMITY;  Surgeon: Iran Ouch, MD;  [facility]: [facility];  Service: Cardiovascular;  Laterality: N/A;   AMPUTATION Left 07/03/2021   Procedure: LEFT BELOW KNEE AMPUTATION;  Surgeon: Nadara Mustard, MD;  Location: [facility] OR;  Service: Orthopedics;  Laterality: Left;   CATARACT EXTRACTION  05/31/2015   Eye/right   COLONOSCOPY  02/05/2005, 03/06/2005   normal, 10 years ago   MOHS micrographic surgery     Face surgery   ORIF ACETABULAR FRACTURE Right 10/09/2020   Procedure: OPEN REDUCTION INTERNAL FIXATION (ORIF) ACETABULAR FRACTURE;  Surgeon: [name], MD;  Location: [facility] OR;  Service: Orthopedics;  Laterality: Right;    No Known Allergies  Outpatient Encounter Medications as of 03/02/2023  Medication Sig   acetaminophen (TYLENOL) 500 MG tablet Take 1,000 mg by mouth 3 (three)  times daily as needed for mild pain or moderate pain.   albuterol (VENTOLIN HFA) 108 (90 Base) MCG/ACT inhaler Inhale 2 puffs into the lungs every 4 (four) hours as needed for wheezing or shortness of breath.   Ascorbic Acid (VITAMIN C) 1000 MG tablet Take 1,000 mg by mouth daily.   calcium carbonate (OSCAL) 1500 (600 Ca) MG TABS tablet Take 600 mg of elemental calcium by mouth daily with breakfast.   carbidopa-levodopa (SINEMET CR) 50-200 MG tablet Take 1 tablet by mouth at bedtime.   carbidopa-levodopa (SINEMET IR) 25-100 MG tablet Take 1 tablet by  mouth in the morning, at noon, in the evening, and at bedtime. 06:00 AM, 12:00 PM, 06:00 PM, 12:00 AM   Carboxymethylcellulose Sodium (ARTIFICIAL TEARS OP) Place 1 drop into both eyes in the morning and at bedtime.   ferrous sulfate 325 (65 FE) MG tablet Take 325 mg by mouth 2 (two) times a week. Monday & Thursday   GUAIFENESIN PO Take 10 mLs by mouth every 6 (six) hours as needed.   Multiple Vitamins-Minerals (PRESERVISION AREDS 2 PO) Take 1 capsule by mouth in the morning and at bedtime.   nystatin (MYCOSTATIN/NYSTOP) powder Apply 1 Application topically every 12 (twelve) hours as needed.   polyethylene glycol (MIRALAX / GLYCOLAX) 17 g packet Take 17 g by mouth daily.   senna (SENOKOT) 8.6 MG tablet Take 1 tablet by mouth daily.   torsemide (DEMADEX) 20 MG tablet Take 20 mg by mouth 3 (three) times a week.   vitamin B-12 (CYANOCOBALAMIN) 1000 MCG tablet Take 1,000 mcg by mouth daily.   Vitamin D, Cholecalciferol, 25 MCG (1000 UT) TABS Take 1,000 Units by mouth daily.   zinc oxide 20 % ointment Apply 1 application. topically See admin instructions. To buttocks after every incontinent episode and as needed for redness.   No facility-administered encounter medications on file as of 03/02/2023.    Review of Systems  Constitutional:  Negative for activity change and appetite change.  Respiratory:  Negative for shortness of breath.   Cardiovascular:  Negative for chest pain.  Gastrointestinal:  Positive for abdominal pain, constipation and diarrhea. Negative for abdominal distention, blood in stool, nausea and vomiting.  Psychiatric/Behavioral:  Negative for confusion and dysphoric mood. The patient is not nervous/anxious.     Immunization History  Administered Date(s) Administered   Fluad Quad(high Dose 65+) 01/29/2022   H1N1 06/13/2008   Influenza Split 03/05/2006, 02/26/2007, 01/26/2008, 01/18/2009, 12/12/2009, 01/16/2011, 01/29/2012, 02/01/2013, 02/08/2014, 02/13/2015, 01/10/2016,  02/23/2017, 02/24/2018, 01/24/2019, 12/22/2019   Influenza, High Dose Seasonal PF 01/30/2021, 02/04/2023   Moderna Covid-19 Fall Seasonal Vaccine 98yrs & older 02/11/2023   Moderna SARS-COV2 Booster Vaccination 09/18/2021   PFIZER(Purple Top)SARS-COV-2 Vaccination 05/04/2019, 05/25/2019, 09/01/2020, 02/11/2022   Pfizer Covid-19 Vaccine Bivalent Booster 58yrs & up 12/27/2019, 12/26/2020   Pfizer(Comirnaty)Fall Seasonal Vaccine 12 years and older 02/11/2022   Pneumococcal Conjugate-13 02/10/2014   Pneumococcal Polysaccharide-23 06/10/2011   Tdap 06/05/2022   Zoster Recombinant(Shingrix) 07/04/2009, 03/24/2022   Pertinent  Health Maintenance Due  Topic Date Due   INFLUENZA VACCINE  Completed      07/31/2022    2:46 PM 10/03/2022    2:02 PM 10/30/2022    2:50 PM 12/01/2022    9:07 AM 02/23/2023    2:34 PM  Fall Risk  Falls in the past year? 0 0 0 0   Was there an injury with Fall? 0 0 0 0 1  Fall Risk Category Calculator 0 0 0 0   Patient  at Risk for Falls Due to No Fall Risks History of fall(s);Impaired balance/gait;Impaired mobility No Fall Risks History of fall(s);Impaired balance/gait;Impaired mobility History of fall(s);Impaired balance/gait;Impaired mobility  Fall risk Follow up Falls evaluation completed Falls evaluation completed;Education provided;Falls prevention discussed Falls evaluation completed Falls evaluation completed;Education provided;Falls prevention discussed Falls evaluation completed;Education provided;Falls prevention discussed   Functional Status Survey:    Vitals:   03/02/23 0942  BP: (!) 147/Greg  Pulse: 76  Resp: 18  Temp: (!) 97.1 F (36.2 C)  SpO2: 95%  Weight: 189 lb 9.6 oz (86 kg)  Height: 5\' 10"  (1.778 m)   Body mass index is 27.2 kg/m. Physical Exam Vitals reviewed.  Constitutional:      General: He is not in acute distress. HENT:     Head: Normocephalic.  Eyes:     General:        Right eye: No discharge.        Left eye: No discharge.   Cardiovascular:     Rate and Rhythm: Normal rate and regular rhythm.     Pulses: Normal pulses.     Heart sounds: Murmur heard.  Pulmonary:     Effort: Pulmonary effort is normal.     Breath sounds: Normal breath sounds.  Abdominal:     General: There is distension.     Palpations: There is no mass.     Tenderness: There is no abdominal tenderness. There is no guarding or rebound.     Hernia: No hernia is present.     Comments: Hypoactive bowel sounds to LLQ & RLQ  Musculoskeletal:     Cervical back: Neck supple.  Skin:    General: Skin is warm.     Capillary Refill: Capillary refill takes less than 2 seconds.  Neurological:     General: No focal deficit present.     Mental Status: He is alert. Mental status is at baseline.     Motor: Weakness present.     Gait: Gait abnormal.  Psychiatric:        Mood and Affect: Mood normal.     Labs reviewed: Recent Labs    03/03/22 0000 09/05/22 0000 01/15/23 0000  NA 140 138 135*  K 4.5 4.3 4.3  CL 104 103 99  CO2 31* 28* 28*  BUN 38* 34* 25*  CREATININE 1.2 1.0 1.0  CALCIUM 8.8 8.9 8.6*   Recent Labs    03/03/22 0000 09/05/22 0000  AST 12* 13*  ALT 5* 7*  ALKPHOS 61 73  ALBUMIN 3.5 3.8   Recent Labs    03/03/22 0000 09/05/22 0000  WBC 5.2 6.0  NEUTROABS 2,938.00  --   HGB 12.5* 13.4*  HCT 38* 40*  PLT 184 176   Lab Results  Component Value Date   TSH 3.07 09/05/2022   Lab Results  Component Value Date   HGBA1C 6.0 09/05/2022   No results found for: "CHOL", "HDL", "LDLCALC", "LDLDIRECT", "TRIG", "CHOLHDL"  Significant Diagnostic Results in last 30 days:  No results found.  Assessment/Plan 1. Lower abdominal pain - onset 11/24 - relieved after episode of diarrhea - mild abdominal distension, lower quadrants hypoactive - irregular bowel movements within past week  - suspect constipation - KUB  2. Diarrhea, unspecified type - see above - hold miralax 11/25  3. Parkinson's disease without  fluctuating manifestations, unspecified whether dyskinesia present (HCC) - followed by Dr. Arbutus Leas - cont sinemet   Family/ staff Communication: plan discussed with patient and nurse  Labs/tests ordered:  KUB

## 2023-03-10 NOTE — Progress Notes (Unsigned)
Assessment/Plan:   1.  Parkinsons Disease -stop carbidopa/levodopa 25/100, 1 tab at 8 AM/11 AM/2 PM/5 PM.  -START carbidopa/levodopa 25/100 CR, 1 tab at 8 AM/11 AM/2 PM/5 PM. Unclear if facility is dosing this way as timing not marked on sheet and in our system it says 6am/noon/6pm/midnight).  Hopeful that changing to CR will help daytime sleepiness.  He does not ambulate, so hopeful that changing it will not worsen motor control. -Continue carbidopa/levodopa 50/200 CR at bedtime   2.  Hx  DVT  -Now off apixaban  3.  RBD  -He is using bed rails  -If something else is needed, can try melatonin at bedtime.  4.  Hallucinations  -Only has these if arises out of sleep.  5.  History of left foot osteomyelitis  -Status post left BKA, March 2023  -Does not ambulate much  6.  PBA  -Have discussed nature and pathophysiology.  No medications required.  7.  Wife asks if needs to come back . Would really like to see him at least yearly and they are agreeable to that.  Happy to see more frequent, if able.  Subjective:   Greg [name] was seen today in follow up for Parkinsons disease  My previous records were reviewed prior to todays visit as well as outside records available to me.  Patient is accompanied by wife who supplemented history.  I have not seen him in a year (their decision).  He had pneumonia earlier in the fall, now resolved.  Nursing facility records indicate that patient is sleeping more.  Some hallucinations - esp if falls asleep and wakes back up in the day.  Usually doesn't arise out of wakefullness though.  Mostly using wheelchair but does use walker with cna to get to the bathroom.  Current prescribed movement disorder medications: Carbidopa/levodopa 25/100, 1 tab at 8 AM/11 AM/2 PM/5 PM (records state 06:00 AM, 12:00 PM, 06:00 PM, 12:00 AM) Carbidopa/levodopa 50/200 CR at bedtime    ALLERGIES:  No Known Allergies  CURRENT MEDICATIONS:  Outpatient Encounter  Medications as of 03/12/2023  Medication Sig   acetaminophen (TYLENOL) 500 MG tablet Take 1,000 mg by mouth 3 (three) times daily as needed for mild pain or moderate pain.   albuterol (VENTOLIN HFA) 108 (90 Base) MCG/ACT inhaler Inhale 2 puffs into the lungs every 4 (four) hours as needed for wheezing or shortness of breath.   Ascorbic Acid (VITAMIN C) 1000 MG tablet Take 1,000 mg by mouth daily.   calcium carbonate (OSCAL) 1500 (600 Ca) MG TABS tablet Take 600 mg of elemental calcium by mouth daily with breakfast.   carbidopa-levodopa (SINEMET CR) 50-200 MG tablet Take 1 tablet by mouth at bedtime.   carbidopa-levodopa (SINEMET IR) 25-100 MG tablet Take 1 tablet by mouth in the morning, at noon, in the evening, and at bedtime. 06:00 AM, 12:00 PM, 06:00 PM, 12:00 AM   Carboxymethylcellulose Sodium (ARTIFICIAL TEARS OP) Place 1 drop into both eyes in the morning and at bedtime.   ferrous sulfate 325 (65 FE) MG tablet Take 325 mg by mouth 2 (two) times a week. Monday & Thursday   GUAIFENESIN PO Take 10 mLs by mouth every 6 (six) hours as needed.   Multiple Vitamins-Minerals (PRESERVISION AREDS 2 PO) Take 1 capsule by mouth in the morning and at bedtime.   nystatin (MYCOSTATIN/NYSTOP) powder Apply 1 Application topically every 12 (twelve) hours as needed.   polyethylene glycol (MIRALAX / GLYCOLAX) 17 g packet Take 17  g by mouth daily.   senna (SENOKOT) 8.6 MG tablet Take 1 tablet by mouth daily.   torsemide (DEMADEX) 20 MG tablet Take 20 mg by mouth 3 (three) times a week.   vitamin B-12 (CYANOCOBALAMIN) 1000 MCG tablet Take 1,000 mcg by mouth daily.   Vitamin D, Cholecalciferol, 25 MCG (1000 UT) TABS Take 1,000 Units by mouth daily.   zinc oxide 20 % ointment Apply 1 application. topically See admin instructions. To buttocks after every incontinent episode and as needed for redness.   No facility-administered encounter medications on file as of 03/12/2023.    Objective:   PHYSICAL EXAMINATION:     VITALS:   Vitals:   03/12/23 1302  BP: 116/62  Pulse: 81  SpO2: 96%  Weight: 180 lb (81.6 kg)  Height: 5\' 10"  (1.778 m)    GEN:  The patient appears stated age and is in NAD.   HEENT:  Normocephalic, atraumatic.  The mucous membranes are moist. The superficial temporal arteries are without ropiness or tenderness. CV:  RRR Neck:  no bruits  Neurological examination:  Orientation: The patient is alert and oriented x3.  He makes appropriate jokes and interacts appropriately. Cranial nerves: There is good facial symmetry with facial hypomimia. The speech is fluent and clear. Soft palate rises symmetrically and there is no tongue deviation. Hearing is decreased to conversational tone. Sensation: Sensation is intact to light touch throughout Motor: Strength is at least antigravity x4.  Movement examination: Tone: There is nl tone today Abnormal movements: there is min RUE rest tremor today Coordination:  There is decremation with RAM's, only with hand opening on the L Gait and Station: Not tested today  I have reviewed and interpreted the following labs independently    Chemistry      Component Value Date/Time   NA 135 (A) 01/15/2023 0000   K 4.3 01/15/2023 0000   CL 99 01/15/2023 0000   CO2 28 (A) 01/15/2023 0000   BUN 25 (A) 01/15/2023 0000   CREATININE 1.0 01/15/2023 0000   CREATININE 1.49 (H) 07/03/2021 0913   CREATININE 1.51 (H) 06/20/2021 1120   GLU 84 01/15/2023 0000      Component Value Date/Time   CALCIUM 8.6 (A) 01/15/2023 0000   ALKPHOS 73 09/05/2022 0000   AST 13 (A) 09/05/2022 0000   ALT 7 (A) 09/05/2022 0000   BILITOT 0.5 07/03/2021 0913       Lab Results  Component Value Date   WBC 6.0 09/05/2022   HGB 13.4 (A) 09/05/2022   HCT 40 (A) 09/05/2022   MCV 89.8 07/05/2021   PLT 176 09/05/2022    Lab Results  Component Value Date   TSH 3.07 09/05/2022     Total time spent on today's visit was 31 minutes, including both face-to-face time and  nonface-to-face time.  Time included that spent on review of records (prior notes available to me/labs/imaging if pertinent), discussing treatment and goals, answering patient's questions and coordinating care.  Cc:  [name], MD

## 2023-03-12 ENCOUNTER — Ambulatory Visit: Payer: Medicare Other | Admitting: Neurology

## 2023-03-12 VITALS — BP 116/62 | HR 81 | Ht 70.0 in | Wt 180.0 lb

## 2023-03-12 DIAGNOSIS — G20A1 Parkinson's disease without dyskinesia, without mention of fluctuations: Secondary | ICD-10-CM | POA: Diagnosis not present

## 2023-03-12 MED ORDER — CARBIDOPA-LEVODOPA ER 25-100 MG PO TBCR: EXTENDED_RELEASE_TABLET | ORAL | 1 refills | Status: DC

## 2023-03-12 NOTE — Patient Instructions (Signed)
STOP carbidopa/levodopa 25/100 IR START carbidopa/levodopa 25/100 ER, 1 at 8am/11am/2pm/5pm CONTINUE carbidopa/levodopa 50/200 ER at bed

## 2023-03-20 ENCOUNTER — Non-Acute Institutional Stay (SKILLED_NURSING_FACILITY): Payer: Self-pay | Admitting: Orthopedic Surgery

## 2023-03-20 ENCOUNTER — Encounter: Payer: Self-pay | Admitting: Orthopedic Surgery

## 2023-03-20 DIAGNOSIS — G20A1 Parkinson's disease without dyskinesia, without mention of fluctuations: Secondary | ICD-10-CM | POA: Diagnosis not present

## 2023-03-20 DIAGNOSIS — M25551 Pain in right hip: Secondary | ICD-10-CM

## 2023-03-20 MED ORDER — PREDNISONE 20 MG PO TABS
ORAL_TABLET | ORAL | Status: AC
Start: 2023-03-20 — End: 2023-03-28

## 2023-03-20 NOTE — Progress Notes (Signed)
Location:  [facility] Room Number: 27/A Place of Service:  SNF [id number]) Provider:  Octavia Heir, NP   [name], MD  Greg [name] Care Team: [name], MD as PCP - General (Internal Medicine) [name], Octaviano Batty, DO as Consulting Physician (Neurology)  Extended Emergency Contact Information Primary Emergency Contact: Lovering,jane Address: [address] [place].          apt [phone] [place] of [place] Home Phone: [phone] Mobile Phone: [phone] Relation: Spouse Secondary Emergency Contact: Wiatrek,kenneth Home Phone: [phone] Mobile Phone: [phone] Relation: Son  Code Status:  DNR Goals of care: Advanced Directive information    03/12/2023   12:57 PM  Advanced Directives  Does Greg [name] Have a Medical Advance Directive? Yes  Type of Advance Directive Living will     Chief Complaint  Greg [name] presents with   Acute Visit    Right hip pain    HPI:  Pt is a 89 y.o. male seen today for acute visit due to increased right hip pain.   He reports increased right hip pain x 1 day. Symptoms began after using Nustep machine. He recently started using 2x/week. He is having trouble using sit/stand transfer. Pain increased with standing, radiates down groin, rated 5/10, described as soreness/sharpness. He has been given tylenol prn without success. No recent falls or injuries. Afebrile. Vitals stable.   H/o parkinson's. Recently seen by Dr. Arbutus Leas. Carbidopa changes to extended release 4x/daily.    Past Medical History:  Diagnosis Date   Acute bronchiolitis    Acute osteomyelitis of calcaneum, left (HCC) 01/11/2021   Anemia    Bursitis of both hips    Complication of anesthesia    wife states pt was a little "loopy" for several days   Dementia (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   Hearing reduced    hearing aid   History of basal cell carcinoma    Hyperlipidemia    Malaise and fatigue    MRSA infection 01/11/2021   Osteoarthritis    hand    Osteoporosis    Palpitation    Parkinson's disease (HCC)    Peripheral vascular disease (HCC)    blood clots   Pre-diabetes    Vitamin D deficiency    Past Surgical History:  Procedure Laterality Date   ABDOMINAL AORTOGRAM W/LOWER EXTREMITY N/A 06/12/2021   Procedure: ABDOMINAL AORTOGRAM W/LOWER EXTREMITY;  Surgeon: Iran Ouch, MD;  Location: [facility] INVASIVE CV [facility];  Service: Cardiovascular;  Laterality: N/A;   AMPUTATION Left 07/03/2021   Procedure: LEFT BELOW KNEE AMPUTATION;  Surgeon: Nadara Mustard, MD;  Location: [facility] OR;  Service: Orthopedics;  Laterality: Left;   CATARACT EXTRACTION  05/31/2015   Eye/right   COLONOSCOPY  02/05/2005, 03/06/2005   normal, 10 years ago   MOHS micrographic surgery     Face surgery   ORIF ACETABULAR FRACTURE Right 10/09/2020   Procedure: OPEN REDUCTION INTERNAL FIXATION (ORIF) ACETABULAR FRACTURE;  Surgeon: [name], MD;  Location: [facility] OR;  Service: Orthopedics;  Laterality: Right;    No Known Allergies  Outpatient Encounter Medications as of 03/20/2023  Medication Sig   acetaminophen (TYLENOL) 500 MG tablet Take 1,000 mg by mouth 3 (three) times daily as needed for mild pain or moderate pain.   albuterol (VENTOLIN HFA) 108 (90 Base) MCG/ACT inhaler Inhale 2 puffs into the lungs every 4 (four) hours as needed for wheezing or shortness of breath.   Ascorbic Acid (VITAMIN C) 1000 MG  tablet Take 1,000 mg by mouth daily.   calcium carbonate (OSCAL) 1500 (600 Ca) MG TABS tablet Take 600 mg of elemental calcium by mouth daily with breakfast.   carbidopa-levodopa (SINEMET CR) 50-200 MG tablet Take 1 tablet by mouth at bedtime.   Carbidopa-Levodopa ER (SINEMET CR) 25-100 MG tablet controlled release 1 at 8am/11am/2pm/5pm   Carboxymethylcellulose Sodium (ARTIFICIAL TEARS OP) Place 1 drop into both eyes in the morning and at bedtime.   ferrous sulfate 325 (65 FE) MG tablet Take 325 mg by mouth 2 (two) times a week. Monday & Thursday   GUAIFENESIN PO  Take 10 mLs by mouth every 6 (six) hours as needed.   Multiple Vitamins-Minerals (PRESERVISION AREDS 2 PO) Take 1 capsule by mouth in the morning and at bedtime.   nystatin (MYCOSTATIN/NYSTOP) powder Apply 1 Application topically every 12 (twelve) hours as needed.   polyethylene glycol (MIRALAX / GLYCOLAX) 17 g packet Take 17 g by mouth daily.   senna (SENOKOT) 8.6 MG tablet Take 1 tablet by mouth daily.   torsemide (DEMADEX) 20 MG tablet Take 20 mg by mouth 3 (three) times a week.   vitamin B-12 (CYANOCOBALAMIN) 1000 MCG tablet Take 1,000 mcg by mouth daily.   Vitamin D, Cholecalciferol, 25 MCG (1000 UT) TABS Take 1,000 Units by mouth daily.   zinc oxide 20 % ointment Apply 1 application. topically See admin instructions. To buttocks after every incontinent episode and as needed for redness.   No facility-administered encounter medications on file as of 03/20/2023.    Review of Systems  Constitutional:  Negative for activity change and appetite change.  Respiratory:  Negative for shortness of breath.   Cardiovascular:  Negative for chest pain.  Musculoskeletal:  Positive for arthralgias and gait problem.  Neurological:  Positive for tremors and weakness.  Psychiatric/Behavioral:  Negative for dysphoric mood. The Greg [name] is not nervous/anxious.     Immunization History  Administered Date(s) Administered   Fluad Quad(high Dose 65+) 01/29/2022   H1N1 06/13/2008   Influenza Split 03/05/2006, 02/26/2007, 01/26/2008, 01/18/2009, 12/12/2009, 01/16/2011, 01/29/2012, 02/01/2013, 02/08/2014, 02/13/2015, 01/10/2016, 02/23/2017, 02/24/2018, 01/24/2019, 12/22/2019   Influenza, High Dose Seasonal PF 01/30/2021, 02/04/2023   Moderna Covid-19 Fall Seasonal Vaccine 44yrs & older 02/11/2023   Moderna SARS-COV2 Booster Vaccination 09/18/2021   PFIZER(Purple Top)SARS-COV-2 Vaccination 05/04/2019, 05/25/2019, 09/01/2020, 02/11/2022   Pfizer Covid-19 Vaccine Bivalent Booster 23yrs & up 12/27/2019,  12/26/2020   Pfizer(Comirnaty)Fall Seasonal Vaccine 12 years and older 02/11/2022   Pneumococcal Conjugate-13 02/10/2014   Pneumococcal Polysaccharide-23 06/10/2011   Tdap 06/05/2022   Zoster Recombinant(Shingrix) 07/04/2009, 03/24/2022   Pertinent  Health Maintenance Due  Topic Date Due   INFLUENZA VACCINE  Completed      10/30/2022    2:50 PM 12/01/2022    9:07 AM 02/23/2023    2:34 PM 03/02/2023    3:44 PM 03/12/2023   12:56 PM  Fall Risk  Falls in the past year? 0 0  0 0  Was there an injury with Fall? 0 0 1 0 0  Fall Risk Category Calculator 0 0  0 0  Greg [name] at Risk for Falls Due to No Fall Risks History of fall(s);Impaired balance/gait;Impaired mobility History of fall(s);Impaired balance/gait;Impaired mobility History of fall(s);Impaired balance/gait;Impaired mobility   Fall risk Follow up Falls evaluation completed Falls evaluation completed;Education provided;Falls prevention discussed Falls evaluation completed;Education provided;Falls prevention discussed Falls evaluation completed;Education provided;Falls prevention discussed Falls evaluation completed   Functional Status Survey:    Vitals:   03/20/23 1508  BP: 125/84  Pulse: 84  Resp: 18  Temp: 97.8 F (36.6 C)  SpO2: 95%  Weight: 175 lb 1.6 oz (79.4 kg)  Height: 5\' 10"  (1.778 m)   Body mass index is 25.12 kg/m. Physical Exam Vitals reviewed.  Constitutional:      General: He is not in acute distress. HENT:     Head: Normocephalic.  Eyes:     General:        Right eye: No discharge.        Left eye: No discharge.  Cardiovascular:     Rate and Rhythm: Normal rate and regular rhythm.     Pulses: Normal pulses.     Heart sounds: Murmur heard.  Pulmonary:     Effort: Pulmonary effort is normal. No respiratory distress.     Breath sounds: Normal breath sounds. No wheezing or rales.  Abdominal:     Palpations: Abdomen is soft.  Musculoskeletal:     Cervical back: Neck supple.     Right hip:  Tenderness present.     Right lower leg: No edema.     Comments: Left BKA. Right hip exam limited due to pain.   Skin:    General: Skin is warm.     Capillary Refill: Capillary refill takes less than 2 seconds.  Neurological:     General: No focal deficit present.     Mental Status: He is alert and oriented to person, place, and time.     Motor: Weakness present.     Gait: Gait abnormal.  Psychiatric:        Mood and Affect: Mood normal.     Labs reviewed: Recent Labs    09/05/22 0000 01/15/23 0000  NA 138 135*  K 4.3 4.3  CL 103 99  CO2 28* 28*  BUN 34* 25*  CREATININE 1.0 1.0  CALCIUM 8.9 8.6*   Recent Labs    09/05/22 0000  AST 13*  ALT 7*  ALKPHOS 73  ALBUMIN 3.8   Recent Labs    09/05/22 0000  WBC 6.0  HGB 13.4*  HCT 40*  PLT 176   Lab Results  Component Value Date   TSH 3.07 09/05/2022   Lab Results  Component Value Date   HGBA1C 6.0 09/05/2022   No results found for: "CHOL", "HDL", "LDLCALC", "LDLDIRECT", "TRIG", "CHOLHDL"  Significant Diagnostic Results in last 30 days:  No results found.  Assessment/Plan 1. Acute right hip pain (Primary) - began after using Nustep, no fall or injury - tenderness to right groin on exam - suspect muscle strain - will try prednisone taper and scheduled tylenol for pain - consider xray right hip if pain does not improve - predniSONE (DELTASONE) 20 MG tablet; Take 1 tablet (20 mg total) by mouth daily with breakfast for 5 days, THEN 1 tablet (20 mg total) daily with breakfast for 3 days.  2. Parkinson's disease without fluctuating manifestations, unspecified whether dyskinesia present (HCC) - followed by Dr. Arbutus Leas - carbidopa now ER - cont skilled nursing care    Family/ staff Communication: plan discussed with Greg [name] and nurse  Labs/tests ordered:  none

## 2023-03-27 ENCOUNTER — Encounter: Payer: Self-pay | Admitting: Orthopedic Surgery

## 2023-03-27 ENCOUNTER — Non-Acute Institutional Stay (SKILLED_NURSING_FACILITY): Payer: Self-pay | Admitting: Orthopedic Surgery

## 2023-03-27 DIAGNOSIS — Z89512 Acquired absence of left leg below knee: Secondary | ICD-10-CM

## 2023-03-27 DIAGNOSIS — R600 Localized edema: Secondary | ICD-10-CM

## 2023-03-27 DIAGNOSIS — R7303 Prediabetes: Secondary | ICD-10-CM

## 2023-03-27 DIAGNOSIS — M25551 Pain in right hip: Secondary | ICD-10-CM

## 2023-03-27 DIAGNOSIS — D508 Other iron deficiency anemias: Secondary | ICD-10-CM

## 2023-03-27 DIAGNOSIS — G20A1 Parkinson's disease without dyskinesia, without mention of fluctuations: Secondary | ICD-10-CM | POA: Diagnosis not present

## 2023-03-27 DIAGNOSIS — I10 Essential (primary) hypertension: Secondary | ICD-10-CM

## 2023-03-27 DIAGNOSIS — N1832 Chronic kidney disease, stage 3b: Secondary | ICD-10-CM

## 2023-03-27 NOTE — Progress Notes (Signed)
Location:  [facility] Room Number: 27/A Place of Service:  SNF [phone]) Provider:  Octavia Heir, NP   [name], MD  Patient Care Team: [name], MD as PCP - General (Internal Medicine) [name], Octaviano Batty, DO as Consulting Physician (Neurology)  Extended Emergency Contact Information Primary Emergency Contact: Feuerstein,jane Address: [address] [place].          apt [phone] [place] Home Phone: [phone] Mobile Phone: [phone] Relation: Spouse Secondary Emergency Contact: Lapinsky,kenneth Home Phone: [phone] Mobile Phone: [phone] Relation: Son  Code Status:  DNR Goals of care: Advanced Directive information    03/12/2023   12:57 PM  Advanced Directives  Does Patient Have a Medical Advance Directive? Yes  Type of Advance Directive Living will     Chief Complaint  Patient presents with   Medical Management of Chronic Issues    HPI:  Pt is a 89 y.o. male seen today for medical management of chronic diseases.    He currently resides on the skilled nursing unit at [facility]. PMH: HTN, HLD, HFpEF, parkinson's, PBA, acute DVT right lower leg 12/27/2020, osteoporosis (past Fosamax use), BPH, CKD, ORIF right acetabular 10/2020, PNA LLL 12/29/2022, and unstable gait.   Acute right hip pain-  noted 12/12 after using Nustep machine, 12/13 started on prednisone taper and scheduled tylenol 1000 mg BID x 7 days, improved pain but still has sharp pain with certain movements, concerns about scheduling tylenol in AM, remains on tylenol 1000 mg po TID prn Parkinson's-  diagnosed 2019, followed by Dr. 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 Lajoyce Corners due to ongoing osteomyelitis (+MRSA) of left heel, no phantom pain, uses prosthesis to ambulate/ wheelchair for long distances RLE- LV EF 60-65% 02/20/2021, improved  since amputation, compression hose to right leg daily, torsemide reduced to 3x/week 05/30 HTN- BUN/creat 25/1.0 01/15/2023, coreg discontinued due to hypotension, see trends below CKD 3b- see above Prediabetes-  A1c 6.0 (05/31)> was 5.8 01/16/2022 Iron deficiency anemia- hgb 13.4 09/05/2022, remains on ferrous sulfate M/W/F  No recent falls or injuries.   Recent blood pressures:  12/17- 130/77  12/10- 125/84  12/03- 94/55  Recent weights:  12/01- 186.5 lbs  11/04- 189.5 lbs  10/02- 187.5 lbs   Past Medical History:  Diagnosis Date   Acute bronchiolitis    Acute osteomyelitis of calcaneum, left (HCC) 01/11/2021   Anemia    Bursitis of both hips    Complication of anesthesia    wife states pt was a little "loopy" for several days   Dementia (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   Hearing reduced    hearing aid   History of basal cell carcinoma    Hyperlipidemia    Malaise and fatigue    MRSA infection 01/11/2021   Osteoarthritis    hand   Osteoporosis    Palpitation    Parkinson's disease (HCC)    Peripheral vascular disease (HCC)    blood clots   Pre-diabetes    Vitamin D deficiency    Past Surgical History:  Procedure Laterality Date   ABDOMINAL AORTOGRAM W/LOWER EXTREMITY N/A 06/12/2021   Procedure: ABDOMINAL AORTOGRAM W/LOWER EXTREMITY;  Surgeon: Iran Ouch, MD;  Location: [facility] INVASIVE CV LAB;  Service: Cardiovascular;  Laterality: N/A;   AMPUTATION Left 07/03/2021   Procedure: LEFT BELOW KNEE  AMPUTATION;  Surgeon: Nadara Mustard, MD;  Location: [facility] OR;  Service: Orthopedics;  Laterality: Left;   CATARACT EXTRACTION  05/31/2015   Eye/right   COLONOSCOPY  02/05/2005, 03/06/2005   normal, 10 years ago   MOHS micrographic surgery     Face surgery   ORIF ACETABULAR FRACTURE Right 10/09/2020   Procedure: OPEN REDUCTION INTERNAL FIXATION (ORIF) ACETABULAR FRACTURE;  Surgeon: [name], MD;  Location: [facility] OR;  Service: Orthopedics;  Laterality: Right;    No Known  Allergies  Outpatient Encounter Medications as of 03/27/2023  Medication Sig   acetaminophen (TYLENOL) 500 MG tablet Take 1,000 mg by mouth 2 times daily at 12 noon and 4 pm.   albuterol (VENTOLIN HFA) 108 (90 Base) MCG/ACT inhaler Inhale 2 puffs into the lungs every 4 (four) hours as needed for wheezing or shortness of breath.   Ascorbic Acid (VITAMIN C) 1000 MG tablet Take 1,000 mg by mouth daily.   calcium carbonate (OSCAL) 1500 (600 Ca) MG TABS tablet Take 600 mg of elemental calcium by mouth daily with breakfast.   carbidopa-levodopa (SINEMET CR) 50-200 MG tablet Take 1 tablet by mouth at bedtime.   Carbidopa-Levodopa ER (SINEMET CR) 25-100 MG tablet controlled release 1 at 8am/11am/2pm/5pm   Carboxymethylcellulose Sodium (ARTIFICIAL TEARS OP) Place 1 drop into both eyes in the morning and at bedtime.   ferrous sulfate 325 (65 FE) MG tablet Take 325 mg by mouth 2 (two) times a week. Monday & Thursday   GUAIFENESIN PO Take 10 mLs by mouth every 6 (six) hours as needed.   Multiple Vitamins-Minerals (PRESERVISION AREDS 2 PO) Take 1 capsule by mouth in the morning and at bedtime.   nystatin (MYCOSTATIN/NYSTOP) powder Apply 1 Application topically every 12 (twelve) hours as needed.   polyethylene glycol (MIRALAX / GLYCOLAX) 17 g packet Take 17 g by mouth daily.   predniSONE (DELTASONE) 20 MG tablet Take 1 tablet (20 mg total) by mouth daily with breakfast for 5 days, THEN 1 tablet (20 mg total) daily with breakfast for 3 days.   senna (SENOKOT) 8.6 MG tablet Take 1 tablet by mouth daily.   torsemide (DEMADEX) 20 MG tablet Take 20 mg by mouth 3 (three) times a week.   vitamin B-12 (CYANOCOBALAMIN) 1000 MCG tablet Take 1,000 mcg by mouth daily.   Vitamin D, Cholecalciferol, 25 MCG (1000 UT) TABS Take 1,000 Units by mouth daily.   zinc oxide 20 % ointment Apply 1 application. topically See admin instructions. To buttocks after every incontinent episode and as needed for redness.   No  facility-administered encounter medications on file as of 03/27/2023.    Review of Systems  Constitutional:  Negative for activity change and appetite change.  HENT:  Negative for sore throat and trouble swallowing.   Eyes:  Negative for visual disturbance.  Respiratory:  Negative for cough, shortness of breath and wheezing.   Cardiovascular:  Negative for chest pain and leg swelling.  Gastrointestinal:  Negative for abdominal distention and abdominal pain.  Genitourinary:  Negative for dysuria, frequency and hematuria.  Musculoskeletal:  Positive for gait problem.  Skin:  Negative for wound.  Neurological:  Positive for tremors and weakness. Negative for dizziness and light-headedness.  Psychiatric/Behavioral:  Negative for confusion, dysphoric mood and sleep disturbance. The patient is not nervous/anxious.     Immunization History  Administered Date(s) Administered   Fluad Quad(high Dose 65+) 01/29/2022   H1N1 06/13/2008   Influenza Split 03/05/2006, 02/26/2007, 01/26/2008, 01/18/2009, 12/12/2009, 01/16/2011, 01/29/2012, 02/01/2013, 02/08/2014,  02/13/2015, 01/10/2016, 02/23/2017, 02/24/2018, 01/24/2019, 12/22/2019   Influenza, High Dose Seasonal PF 01/30/2021, 02/04/2023   Moderna Covid-19 Fall Seasonal Vaccine 11yrs & older 02/11/2023   Moderna SARS-COV2 Booster Vaccination 09/18/2021   PFIZER(Purple Top)SARS-COV-2 Vaccination 05/04/2019, 05/25/2019, 09/01/2020, 02/11/2022   Pfizer Covid-19 Vaccine Bivalent Booster 37yrs & up 12/27/2019, 12/26/2020   Pfizer(Comirnaty)Fall Seasonal Vaccine 12 years and older 02/11/2022   Pneumococcal Conjugate-13 02/10/2014   Pneumococcal Polysaccharide-23 06/10/2011   Tdap 06/05/2022   Zoster Recombinant(Shingrix) 07/04/2009, 03/24/2022   Pertinent  Health Maintenance Due  Topic Date Due   INFLUENZA VACCINE  Completed      10/30/2022    2:50 PM 12/01/2022    9:07 AM 02/23/2023    2:34 PM 03/02/2023    3:44 PM 03/12/2023   12:56 PM  Fall  Risk  Falls in the past year? 0 0  0 0  Was there an injury with Fall? 0 0 1 0 0  Fall Risk Category Calculator 0 0  0 0  Patient at Risk for Falls Due to No Fall Risks History of fall(s);Impaired balance/gait;Impaired mobility History of fall(s);Impaired balance/gait;Impaired mobility History of fall(s);Impaired balance/gait;Impaired mobility   Fall risk Follow up Falls evaluation completed Falls evaluation completed;Education provided;Falls prevention discussed Falls evaluation completed;Education provided;Falls prevention discussed Falls evaluation completed;Education provided;Falls prevention discussed Falls evaluation completed   Functional Status Survey:    Vitals:   03/27/23 1527  BP: 130/77  Pulse: 85  Resp: 18  Temp: 97.8 F (36.6 C)  SpO2: 95%  Weight: 175 lb 1.6 oz (79.4 kg)  Height: 5\' 10"  (1.778 m)   Body mass index is 25.12 kg/m. Physical Exam Vitals reviewed.  Constitutional:      General: He is not in acute distress. HENT:     Head: Normocephalic.  Eyes:     General:        Right eye: No discharge.        Left eye: No discharge.  Cardiovascular:     Rate and Rhythm: Normal rate and regular rhythm.     Pulses: Normal pulses.     Heart sounds: Murmur heard.  Pulmonary:     Effort: Pulmonary effort is normal. No respiratory distress.     Breath sounds: Normal breath sounds. No wheezing.  Abdominal:     General: Bowel sounds are normal.     Palpations: Abdomen is soft.  Musculoskeletal:     Cervical back: Neck supple.     Left lower leg: No edema.     Comments: Left BKA  Skin:    General: Skin is warm.     Capillary Refill: Capillary refill takes less than 2 seconds.  Neurological:     General: No focal deficit present.     Mental Status: He is alert and oriented to person, place, and time.     Motor: Weakness present.     Gait: Gait abnormal.     Comments: Wheelchair, sit/stand transfer, 1+ assist with ambulation  Psychiatric:        Mood and  Affect: Mood normal.     Labs reviewed: Recent Labs    09/05/22 0000 01/15/23 0000  NA 138 135*  K 4.3 4.3  CL 103 99  CO2 28* 28*  BUN 34* 25*  CREATININE 1.0 1.0  CALCIUM 8.9 8.6*   Recent Labs    09/05/22 0000  AST 13*  ALT 7*  ALKPHOS 73  ALBUMIN 3.8   Recent Labs    09/05/22 0000  WBC 6.0  HGB 13.4*  HCT 40*  PLT 176   Lab Results  Component Value Date   TSH 3.07 09/05/2022   Lab Results  Component Value Date   HGBA1C 6.0 09/05/2022   No results found for: "CHOL", "HDL", "LDLCALC", "LDLDIRECT", "TRIG", "CHOLHDL"  Significant Diagnostic Results in last 30 days:  No results found.  Assessment/Plan 1. Acute right hip pain (Primary) - onset 12/12 after Nustep use> getting into machine caused injury - prednisone taper and scheduled tylenol today - cont tylenol TID prn  - consider xray if pain continues or scheduling tylenol in AM   2. Parkinson's disease without fluctuating manifestations, unspecified whether dyskinesia present (HCC) - followed by Dr. Arbutus Leas - cont carbidopa - cont skilled nursing  3. S/P below knee amputation, left (HCC) - d/t osteomyelitis - doig well with prosthetic  4. Edema of right lower leg - stable with torsemide  5. Essential hypertension - controlled without medication  6. Stage 3b chronic kidney disease (HCC) - encourage hydration with water - avoid NSAIDS  7. Prediabetes - A1c 6.0 - diet controlled  8. Iron deficiency anemia secondary to inadequate dietary iron intake - cont ferrous sulfate 3x/week    Family/ staff Communication: plan discussed with patient, wife and nurse  Labs/tests ordered:  cbc/diff, bmp, TSH, A1c 04/13/2023

## 2023-04-14 LAB — BASIC METABOLIC PANEL WITH GFR
BUN: 23 — AB (ref 4–21)
CO2: 27 — AB (ref 13–22)
Chloride: 99 (ref 99–108)
Creatinine: 0.9 (ref 0.6–1.3)
Glucose: 93
Potassium: 4.8 meq/L (ref 3.5–5.1)
Sodium: 138 (ref 137–147)

## 2023-04-14 LAB — CBC AND DIFFERENTIAL
HCT: 36 — AB (ref 41–53)
Hemoglobin: 12 — AB (ref 13.5–17.5)
Neutrophils Absolute: 4576
Platelets: 314 10*3/uL (ref 150–400)
WBC: 6.7

## 2023-04-14 LAB — COMPREHENSIVE METABOLIC PANEL WITH GFR
Calcium: 9 (ref 8.7–10.7)
eGFR: 80

## 2023-04-14 LAB — CBC: RBC: 3.9 (ref 3.87–5.11)

## 2023-04-30 ENCOUNTER — Non-Acute Institutional Stay (SKILLED_NURSING_FACILITY): Payer: Self-pay | Admitting: Internal Medicine

## 2023-04-30 DIAGNOSIS — N1832 Chronic kidney disease, stage 3b: Secondary | ICD-10-CM

## 2023-04-30 DIAGNOSIS — I10 Essential (primary) hypertension: Secondary | ICD-10-CM | POA: Diagnosis not present

## 2023-04-30 DIAGNOSIS — G20A1 Parkinson's disease without dyskinesia, without mention of fluctuations: Secondary | ICD-10-CM | POA: Diagnosis not present

## 2023-04-30 DIAGNOSIS — R600 Localized edema: Secondary | ICD-10-CM

## 2023-04-30 DIAGNOSIS — Z89512 Acquired absence of left leg below knee: Secondary | ICD-10-CM

## 2023-04-30 DIAGNOSIS — D508 Other iron deficiency anemias: Secondary | ICD-10-CM

## 2023-04-30 DIAGNOSIS — F32A Depression, unspecified: Secondary | ICD-10-CM

## 2023-04-30 NOTE — Progress Notes (Unsigned)
[facility]  SNF (31)  Provider:   Code Status: DNR Goals of Care:     03/12/2023   12:57 PM  Advanced Directives  Does Patient Have a Medical Advance Directive? Yes  Type of Advance Directive Living will     Chief Complaint  Patient presents with  . Care Management    HPI: Patient is a 89 y.o. male seen today for medical management of chronic diseases.   Lives in SNF   Patient is s/p Left BKA for Left Osteomyelitis  h/o Parkinson disease Follows with Dr [name] Diagnosed in 2019 in [place]   h/o Osteoporosis , Vit D def Has been on Fosamax before, BPH, Arthritis,   right femur fracture followed by acute DVT of right leg.  Continues to do well Had Right Groin pain after using Nu step Xrays were negative and it is better now He was Mildly Emotional and depressed Almost Crying today  Coontinues toneed Stand up Lift for transfers   Past Medical History:  Diagnosis Date  . Acute bronchiolitis   . Acute osteomyelitis of calcaneum, left (HCC) 01/11/2021  . Anemia   . Bursitis of both hips   . Complication of anesthesia    wife states pt was a little "loopy" for several days  . Dementia (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  . Hearing reduced    hearing aid  . History of basal cell carcinoma   . Hyperlipidemia   . Malaise and fatigue   . MRSA infection 01/11/2021  . Osteoarthritis    hand  . Osteoporosis   . Palpitation   . Parkinson's disease (HCC)   . Peripheral vascular disease (HCC)    blood clots  . Pre-diabetes   . Vitamin D deficiency     Past Surgical History:  Procedure Laterality Date  . ABDOMINAL AORTOGRAM W/LOWER EXTREMITY N/A 06/12/2021   Procedure: ABDOMINAL AORTOGRAM W/LOWER EXTREMITY;  Surgeon: Iran Ouch, MD;  Location: [facility] INVASIVE CV LAB;  Service: Cardiovascular;  Laterality: N/A;  . AMPUTATION Left 07/03/2021   Procedure: LEFT BELOW KNEE AMPUTATION;  Surgeon: Nadara Mustard, MD;  Location: [facility] OR;   Service: Orthopedics;  Laterality: Left;  . CATARACT EXTRACTION  05/31/2015   Eye/right  . COLONOSCOPY  02/05/2005, 03/06/2005   normal, 10 years ago  . MOHS micrographic surgery     Face surgery  . ORIF ACETABULAR FRACTURE Right 10/09/2020   Procedure: OPEN REDUCTION INTERNAL FIXATION (ORIF) ACETABULAR FRACTURE;  Surgeon: [name], MD;  Location: [facility] OR;  Service: Orthopedics;  Laterality: Right;    No Known Allergies  Outpatient Encounter Medications as of 04/30/2023  Medication Sig  . albuterol (VENTOLIN HFA) 108 (90 Base) MCG/ACT inhaler Inhale 2 puffs into the lungs every 4 (four) hours as needed for wheezing or shortness of breath.  . Ascorbic Acid (VITAMIN C) 1000 MG tablet Take 1,000 mg by mouth daily.  . calcium carbonate (OSCAL) 1500 (600 Ca) MG TABS tablet Take 600 mg of elemental calcium by mouth daily with breakfast.  . carbidopa-levodopa (SINEMET CR) 50-200 MG tablet Take 1 tablet by mouth at bedtime.  . Carbidopa-Levodopa ER (SINEMET CR) 25-100 MG tablet controlled release 1 at 8am/11am/2pm/5pm  . Carboxymethylcellulose Sodium (ARTIFICIAL TEARS OP) Place 1 drop into both eyes in the morning and at bedtime.  . ferrous sulfate 325 (65 FE) MG tablet Take 325 mg by mouth 2 (two) times a week. Monday & Thursday  .  GUAIFENESIN PO Take 10 mLs by mouth every 6 (six) hours as needed.  . Multiple Vitamins-Minerals (PRESERVISION AREDS 2 PO) Take 1 capsule by mouth in the morning and at bedtime.  Marland Kitchen nystatin (MYCOSTATIN/NYSTOP) powder Apply 1 Application topically every 12 (twelve) hours as needed.  . polyethylene glycol (MIRALAX / GLYCOLAX) 17 g packet Take 17 g by mouth daily.  Marland Kitchen senna (SENOKOT) 8.6 MG tablet Take 1 tablet by mouth daily.  Marland Kitchen torsemide (DEMADEX) 20 MG tablet Take 20 mg by mouth 3 (three) times a week.  . vitamin B-12 (CYANOCOBALAMIN) 1000 MCG tablet Take 1,000 mcg by mouth daily.  . Vitamin D, Cholecalciferol, 25 MCG (1000 UT) TABS Take 1,000 Units by mouth daily.   Marland Kitchen zinc oxide 20 % ointment Apply 1 application. topically See admin instructions. To buttocks after every incontinent episode and as needed for redness.   No facility-administered encounter medications on file as of 04/30/2023.    Review of Systems:  Review of Systems  Health Maintenance  Topic Date Due  . Medicare Annual Wellness (AWV)  06/05/2023  . DTaP/Tdap/Td (2 - Td or Tdap) 06/04/2032  . Pneumonia Vaccine 63+ Years old  Completed  . INFLUENZA VACCINE  Completed  . COVID-19 Vaccine  Completed  . Zoster Vaccines- Shingrix  Completed  . HPV VACCINES  Aged Out    Physical Exam: There were no vitals filed for this visit. There is no height or weight on file to calculate BMI. Physical Exam  Labs reviewed: Basic Metabolic Panel: Recent Labs    09/05/22 0000 01/15/23 0000  NA 138 135*  K 4.3 4.3  CL 103 99  CO2 28* 28*  BUN 34* 25*  CREATININE 1.0 1.0  CALCIUM 8.9 8.6*  TSH 3.07  --    Liver Function Tests: Recent Labs    09/05/22 0000  AST 13*  ALT 7*  ALKPHOS 73  ALBUMIN 3.8   No results for input(s): "LIPASE", "AMYLASE" in the last 8760 hours. No results for input(s): "AMMONIA" in the last 8760 hours. CBC: Recent Labs    09/05/22 0000  WBC 6.0  HGB 13.4*  HCT 40*  PLT 176   Lipid Panel: No results for input(s): "CHOL", "HDL", "LDLCALC", "TRIG", "CHOLHDL", "LDLDIRECT" in the last 8760 hours. Lab Results  Component Value Date   HGBA1C 6.0 09/05/2022    Procedures since last visit: No results found.  Assessment/Plan 1. Parkinson's disease without fluctuating manifestations, unspecified whether dyskinesia present (HCC) (Primary) ***  2. S/P below knee amputation, left (HCC) ***  3. Edema of right lower leg ***  4. Essential hypertension ***  5. Stage 3b chronic kidney disease (HCC) ***  6. Iron deficiency anemia secondary to inadequate dietary iron intake ***  7. Depression, unspecified depression type ***    Labs/tests  ordered:  * No order type specified * Next appt:  Visit date not found

## 2023-05-03 ENCOUNTER — Encounter: Payer: Self-pay | Admitting: Internal Medicine

## 2023-05-15 ENCOUNTER — Non-Acute Institutional Stay (SKILLED_NURSING_FACILITY): Payer: Self-pay | Admitting: Orthopedic Surgery

## 2023-05-15 ENCOUNTER — Encounter: Payer: Self-pay | Admitting: Orthopedic Surgery

## 2023-05-15 DIAGNOSIS — Z89512 Acquired absence of left leg below knee: Secondary | ICD-10-CM | POA: Diagnosis not present

## 2023-05-15 DIAGNOSIS — R600 Localized edema: Secondary | ICD-10-CM | POA: Diagnosis not present

## 2023-05-18 NOTE — Progress Notes (Signed)
 Location:  [facility] Room Number: 27/A Place of Service:  SNF [phone]) Provider:  Greig FORBES Cluster, NP   Charlanne Fredia CROME, MD  Patient Care Team: Charlanne Fredia CROME, MD as PCP - General (Internal Medicine) [name], Asberry RAMAN, DO as Consulting Physician (Neurology)  Extended Emergency Contact Information Primary Emergency Contact: Wedeking,jane Address: [address] [place].          apt [phone] [place] Home Phone: [phone] Mobile Phone: [phone] Relation: Spouse Secondary Emergency Contact: Iafrate,kenneth Home Phone: [phone] Mobile Phone: [phone] Relation: Son  Code Status:  DNR Goals of care: Advanced Directive information    03/12/2023   12:57 PM  Advanced Directives  Does Patient Have a Medical Advance Directive? Yes  Type of Advance Directive Living will     Chief Complaint  Patient presents with   Acute Visit    Right leg swelling    HPI:  Pt is a 89 y.o. male seen today for acute visit due to right leg swelling.   He currently resides on the skilled nursing unit at [facility]. PMH: HTN, HLD, HFpEF, parkinson's, PBA, acute DVT right lower leg 12/27/2020, osteoporosis (past Fosamax use), BPH, CKD, ORIF right acetabular 10/2020, PNA LLL 12/29/2022, and unstable gait.   Increased edema to right lower leg x 2 days. Remains on Torsemide  3x/week for ongoing edema. LVEF 60-65% 02/2021. He denies calf pain, chest pain or shortness of breath. He has been wearing compression stockings daily, but they appear worn. No recent falls or injury. H/o acute DVT right lower leg 12/2020 post op hip surgery. Afebrile. Vitals stable.    Past Medical History:  Diagnosis Date   Acute bronchiolitis    Acute osteomyelitis of calcaneum, left (HCC) 01/11/2021   Anemia    Bursitis of both hips    Complication of anesthesia    wife states pt was a little loopy for several days   Dementia (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   Hearing  reduced    hearing aid   History of basal cell carcinoma    Hyperlipidemia    Malaise and fatigue    MRSA infection 01/11/2021   Osteoarthritis    hand   Osteoporosis    Palpitation    Parkinson's disease (HCC)    Peripheral vascular disease (HCC)    blood clots   Pre-diabetes    Vitamin D  deficiency    Past Surgical History:  Procedure Laterality Date   ABDOMINAL AORTOGRAM W/LOWER EXTREMITY N/A 06/12/2021   Procedure: ABDOMINAL AORTOGRAM W/LOWER EXTREMITY;  Surgeon: Darron Deatrice LABOR, MD;  Location: [facility] INVASIVE CV LAB;  Service: Cardiovascular;  Laterality: N/A;   AMPUTATION Left 07/03/2021   Procedure: LEFT BELOW KNEE AMPUTATION;  Surgeon: Harden Jerona GAILS, MD;  Location: [facility] OR;  Service: Orthopedics;  Laterality: Left;   CATARACT EXTRACTION  05/31/2015   Eye/right   COLONOSCOPY  02/05/2005, 03/06/2005   normal, 10 years ago   MOHS micrographic surgery     Face surgery   ORIF ACETABULAR FRACTURE Right 10/09/2020   Procedure: OPEN REDUCTION INTERNAL FIXATION (ORIF) ACETABULAR FRACTURE;  Surgeon: Celena Sharper, MD;  Location: [facility] OR;  Service: Orthopedics;  Laterality: Right;    No Known Allergies  Outpatient Encounter Medications as of 05/15/2023  Medication Sig   albuterol  (VENTOLIN  HFA) 108 (90 Base) MCG/ACT inhaler Inhale 2 puffs into the lungs every 4 (four) hours as needed for wheezing or shortness of breath.  Ascorbic Acid  (VITAMIN C) 1000 MG tablet Take 1,000 mg by mouth daily.   calcium  carbonate (OSCAL) 1500 (600 Ca) MG TABS tablet Take 600 mg of elemental calcium  by mouth daily with breakfast.   carbidopa -levodopa  (SINEMET  CR) 50-200 MG tablet Take 1 tablet by mouth at bedtime.   Carbidopa -Levodopa  ER (SINEMET  CR) 25-100 MG tablet controlled release 1 at 8am/11am/2pm/5pm   Carboxymethylcellulose Sodium (ARTIFICIAL TEARS OP) Place 1 drop into both eyes in the morning and at bedtime.   ferrous sulfate 325 (65 FE) MG tablet Take 325 mg by mouth 2 (two) times a week. Monday &  Thursday   GUAIFENESIN  PO Take 10 mLs by mouth every 6 (six) hours as needed.   Multiple Vitamins-Minerals (PRESERVISION AREDS 2 PO) Take 1 capsule by mouth in the morning and at bedtime.   nystatin  (MYCOSTATIN /NYSTOP ) powder Apply 1 Application topically every 12 (twelve) hours as needed.   polyethylene glycol (MIRALAX  / GLYCOLAX ) 17 g packet Take 17 g by mouth daily.   senna (SENOKOT) 8.6 MG tablet Take 1 tablet by mouth daily.   torsemide  (DEMADEX ) 20 MG tablet Take 20 mg by mouth 3 (three) times a week.   vitamin B-12 (CYANOCOBALAMIN) 1000 MCG tablet Take 1,000 mcg by mouth daily.   Vitamin D , Cholecalciferol , 25 MCG (1000 UT) TABS Take 1,000 Units by mouth daily.   zinc  oxide 20 % ointment Apply 1 application. topically See admin instructions. To buttocks after every incontinent episode and as needed for redness.   No facility-administered encounter medications on file as of 05/15/2023.    Review of Systems  Constitutional:  Negative for activity change and appetite change.  Respiratory:  Negative for shortness of breath.   Cardiovascular:  Positive for leg swelling. Negative for chest pain.  Musculoskeletal:  Positive for gait problem.  Skin:  Negative for wound.  Psychiatric/Behavioral:  Negative for dysphoric mood. The patient is not nervous/anxious.     Immunization History  Administered Date(s) Administered   Fluad Quad(high Dose 65+) 01/29/2022   H1N1 06/13/2008   Influenza Split 03/05/2006, 02/26/2007, 01/26/2008, 01/18/2009, 12/12/2009, 01/16/2011, 01/29/2012, 02/01/2013, 02/08/2014, 02/13/2015, 01/10/2016, 02/23/2017, 02/24/2018, 01/24/2019, 12/22/2019   Influenza, High Dose Seasonal PF 01/30/2021, 02/04/2023   Moderna Covid-19 Fall Seasonal Vaccine 67yrs & older 02/11/2023   Moderna SARS-COV2 Booster Vaccination 09/18/2021   PFIZER(Purple Top)SARS-COV-2 Vaccination 05/04/2019, 05/25/2019, 09/01/2020, 02/11/2022   Pfizer Covid-19 Vaccine Bivalent Booster 63yrs & up  12/27/2019, 12/26/2020   Pfizer(Comirnaty)Fall Seasonal Vaccine 12 years and older 02/11/2022   Pneumococcal Conjugate-13 02/10/2014   Pneumococcal Polysaccharide-23 06/10/2011   Tdap 06/05/2022   Zoster Recombinant(Shingrix) 07/04/2009, 03/24/2022   Pertinent  Health Maintenance Due  Topic Date Due   INFLUENZA VACCINE  Completed      10/30/2022    2:50 PM 12/01/2022    9:07 AM 02/23/2023    2:34 PM 03/02/2023    3:44 PM 03/12/2023   12:56 PM  Fall Risk  Falls in the past year? 0 0  0 0  Was there an injury with Fall? 0 0 1 0 0  Fall Risk Category Calculator 0 0  0 0  Patient at Risk for Falls Due to No Fall Risks History of fall(s);Impaired balance/gait;Impaired mobility History of fall(s);Impaired balance/gait;Impaired mobility History of fall(s);Impaired balance/gait;Impaired mobility   Fall risk Follow up Falls evaluation completed Falls evaluation completed;Education provided;Falls prevention discussed Falls evaluation completed;Education provided;Falls prevention discussed Falls evaluation completed;Education provided;Falls prevention discussed Falls evaluation completed   Functional Status Survey:    Vitals:  05/15/23 1513  BP: 118/70  Pulse: 83  Resp: 19  Temp: 97.8 F (36.6 C)  SpO2: 97%  Weight: 184 lb 1.6 oz (83.5 kg)  Height: 5' 10 (1.778 m)   Body mass index is 26.42 kg/m. Physical Exam Vitals reviewed.  Constitutional:      General: He is not in acute distress. HENT:     Head: Normocephalic.  Eyes:     General:        Right eye: No discharge.        Left eye: No discharge.  Cardiovascular:     Rate and Rhythm: Normal rate and regular rhythm.     Pulses: Normal pulses.     Heart sounds: Normal heart sounds.  Pulmonary:     Effort: Pulmonary effort is normal.     Breath sounds: Normal breath sounds.  Musculoskeletal:     Cervical back: Neck supple.     Right lower leg: Edema present.     Comments: RLE non pitting edema, extremity cool to touch,  left BKA  Skin:    General: Skin is warm.  Neurological:     General: No focal deficit present.     Mental Status: He is alert and oriented to person, place, and time.     Motor: Weakness present.     Gait: Gait abnormal.  Psychiatric:        Mood and Affect: Mood normal.     Labs reviewed: Recent Labs    09/05/22 0000 01/15/23 0000  NA 138 135*  K 4.3 4.3  CL 103 99  CO2 28* 28*  BUN 34* 25*  CREATININE 1.0 1.0  CALCIUM  8.9 8.6*   Recent Labs    09/05/22 0000  AST 13*  ALT 7*  ALKPHOS 73  ALBUMIN  3.8   Recent Labs    09/05/22 0000  WBC 6.0  HGB 13.4*  HCT 40*  PLT 176   Lab Results  Component Value Date   TSH 3.07 09/05/2022   Lab Results  Component Value Date   HGBA1C 6.0 09/05/2022   No results found for: CHOL, HDL, LDLCALC, LDLDIRECT, TRIG, CHOLHDL  Significant Diagnostic Results in last 30 days:  No results found.  Assessment/Plan 1. Edema of right lower leg (Primary) - onset x 1 day - RLE with non pitting edema, cool to touch - remains on torsemide  3x/week - extra torsemide  20 mg po daily x 2 days - advised to try new compression stockings ( thigh or knee) - h/o postop DVT 2022> off anticoagulation - recommend US  doppler RLE if no improvement  2. S/P below knee amputation, left (HCC) - d/t osteomyelitis - doing well with prosthesis - cont skilled nursing    Family/ staff Communication: plan discussed with patient and nurse  Labs/tests ordered:  none

## 2023-05-29 ENCOUNTER — Non-Acute Institutional Stay (SKILLED_NURSING_FACILITY): Payer: Self-pay | Admitting: Orthopedic Surgery

## 2023-05-29 ENCOUNTER — Encounter: Payer: Self-pay | Admitting: Orthopedic Surgery

## 2023-05-29 DIAGNOSIS — I10 Essential (primary) hypertension: Secondary | ICD-10-CM

## 2023-05-29 DIAGNOSIS — R600 Localized edema: Secondary | ICD-10-CM

## 2023-05-29 DIAGNOSIS — M25551 Pain in right hip: Secondary | ICD-10-CM | POA: Diagnosis not present

## 2023-05-29 DIAGNOSIS — R7303 Prediabetes: Secondary | ICD-10-CM

## 2023-05-29 DIAGNOSIS — G8929 Other chronic pain: Secondary | ICD-10-CM

## 2023-05-29 DIAGNOSIS — Z89512 Acquired absence of left leg below knee: Secondary | ICD-10-CM | POA: Diagnosis not present

## 2023-05-29 DIAGNOSIS — G20A1 Parkinson's disease without dyskinesia, without mention of fluctuations: Secondary | ICD-10-CM | POA: Diagnosis not present

## 2023-05-29 DIAGNOSIS — N1832 Chronic kidney disease, stage 3b: Secondary | ICD-10-CM

## 2023-05-29 DIAGNOSIS — D508 Other iron deficiency anemias: Secondary | ICD-10-CM

## 2023-05-29 NOTE — Progress Notes (Signed)
 Location:  [facility] Room Number: 27-A Place of Service:  SNF [phone]) Provider:  Octavia Heir, NP   [name], MD  Patient Care Team: [name], MD as PCP - General (Internal Medicine) [name], Octaviano Batty, DO as Consulting Physician (Neurology)  Extended Emergency Contact Information Primary Emergency Contact: Tankard,jane Address: [address] [place].          apt [phone] [place] Home Phone: [phone] Mobile Phone: [phone] Relation: Spouse Secondary Emergency Contact: Sutherlin,kenneth Home Phone: [phone] Mobile Phone: [phone] Relation: Son  Code Status: Full code Goals of care: Advanced Directive information    03/12/2023   12:57 PM  Advanced Directives  Does Patient Have a Medical Advance Directive? Yes  Type of Advance Directive Living will     Chief Complaint  Patient presents with   Medical Management of Chronic Issues    Routine Visit.     HPI:  Pt is a 89 y.o. male seen today for medical management of chronic diseases.    He currently resides on the skilled nursing unit at [facility] PMH: HTN, HLD, HFpEF, parkinson's, PBA, acute DVT right lower leg 12/27/2020, osteoporosis (past Fosamax use), BPH, CKD, ORIF right acetabular 10/2020, PNA LLL 12/29/2022, and unstable gait.    Right hip pain-  noted 12/12 after using Nustep machine, 12/13 started on prednisone taper and scheduled tylenol 1000 mg BID x 7 days, improved pain but still has sharp pain with certain movements, concerns about scheduling tylenol in AM, remains on tylenol 1000 mg po TID prn Parkinson's-  diagnosed 2019, followed by Dr. 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 Lajoyce Corners due to ongoing osteomyelitis (+MRSA) of left heel, no phantom pain, uses prosthesis to ambulate/ wheelchair for long distances Edema- LV EF 60-65%  02/20/2021, improved since amputation, compression hose to right leg daily, torsemide reduced to 3x/week 05/30 HTN- BUN/creat 25/1.0 01/15/2023, coreg discontinued due to hypotension, see trends below CKD 3b- see above Prediabetes-  A1c 6.0 (05/31)> was 5.8 01/16/2022 Iron deficiency anemia- hgb 13.4 09/05/2022, remains on ferrous sulfate M/W/F   Past Medical History:  Diagnosis Date   Acute bronchiolitis    Acute osteomyelitis of calcaneum, left (HCC) 01/11/2021   Anemia    Bursitis of both hips    Complication of anesthesia    wife states pt was a little "loopy" for several days   Dementia (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   Hearing reduced    hearing aid   History of basal cell carcinoma    Hyperlipidemia    Malaise and fatigue    MRSA infection 01/11/2021   Osteoarthritis    hand   Osteoporosis    Palpitation    Parkinson's disease (HCC)    Peripheral vascular disease (HCC)    blood clots   Pre-diabetes    Vitamin D deficiency    Past Surgical History:  Procedure Laterality Date   ABDOMINAL AORTOGRAM W/LOWER EXTREMITY N/A 06/12/2021   Procedure: ABDOMINAL AORTOGRAM W/LOWER EXTREMITY;  Surgeon: Iran Ouch, MD;  Location: [facility] INVASIVE CV LAB;  Service: Cardiovascular;  Laterality: N/A;   AMPUTATION Left 07/03/2021   Procedure: LEFT BELOW KNEE AMPUTATION;  Surgeon: Nadara Mustard, MD;  Location: [facility] OR;  Service: Orthopedics;  Laterality: Left;   CATARACT EXTRACTION  05/31/2015   Eye/right   COLONOSCOPY  02/05/2005, 03/06/2005   normal, 10 years ago   MOHS micrographic surgery     Face surgery   ORIF ACETABULAR FRACTURE Right 10/09/2020   Procedure: OPEN REDUCTION INTERNAL FIXATION (ORIF) ACETABULAR FRACTURE;  Surgeon: [name], MD;  Location: [facility] OR;  Service: Orthopedics;  Laterality: Right;    No Known Allergies  Outpatient Encounter Medications as of 05/29/2023  Medication Sig   acetaminophen (TYLENOL) 500 MG tablet Take 1,000 mg by mouth as needed.   albuterol  (VENTOLIN HFA) 108 (90 Base) MCG/ACT inhaler Inhale 2 puffs into the lungs every 4 (four) hours as needed for wheezing or shortness of breath.   calcium carbonate (CALCIUM 600) 600 MG TABS tablet Take 600 mg by mouth every morning.   CALCIUM CARBONATE ANTACID PO Take 1 tablet by mouth every 8 (eight) hours as needed.   Carboxymethylcellulose Sodium (ARTIFICIAL TEARS OP) Place 1 drop into both eyes every morning.   White Petrolatum-Mineral Oil (ARTIFICIAL TEARS) 83-15 % OINT Place 1 Application into both eyes at bedtime.   Ascorbic Acid (VITAMIN C) 1000 MG tablet Take 1,000 mg by mouth daily.   carbidopa-levodopa (SINEMET CR) 50-200 MG tablet Take 1 tablet by mouth at bedtime.   Carbidopa-Levodopa ER (SINEMET CR) 25-100 MG tablet controlled release 1 at 8am/11am/2pm/5pm   ferrous sulfate 325 (65 FE) MG tablet Take 325 mg by mouth 2 (two) times a week. Monday & Thursday   GUAIFENESIN PO Take 10 mLs by mouth every 6 (six) hours as needed.   Multiple Vitamins-Minerals (PRESERVISION AREDS 2 PO) Take 1 capsule by mouth in the morning and at bedtime.   nystatin (MYCOSTATIN/NYSTOP) powder Apply 1 Application topically every 12 (twelve) hours as needed.   polyethylene glycol (MIRALAX / GLYCOLAX) 17 g packet Take 17 g by mouth daily.   senna (SENOKOT) 8.6 MG tablet Take 1 tablet by mouth daily.   torsemide (DEMADEX) 20 MG tablet Take 20 mg by mouth 3 (three) times a week.   vitamin B-12 (CYANOCOBALAMIN) 1000 MCG tablet Take 1,000 mcg by mouth daily.   Vitamin D, Cholecalciferol, 25 MCG (1000 UT) TABS Take 1,000 Units by mouth daily.   zinc oxide 20 % ointment Apply 1 application. topically See admin instructions. To buttocks after every incontinent episode and as needed for redness.   [DISCONTINUED] calcium carbonate (OSCAL) 1500 (600 Ca) MG TABS tablet Take 600 mg of elemental calcium by mouth daily with breakfast.   No facility-administered encounter medications on file as of 05/29/2023.    Review of  Systems  Constitutional: Negative.   HENT: Negative.    Eyes: Negative.   Respiratory:  Negative for cough and shortness of breath.   Cardiovascular:  Positive for leg swelling. Negative for chest pain.  Gastrointestinal: Negative.   Genitourinary: Negative.   Musculoskeletal:  Positive for gait problem.  Skin:  Negative for wound.  Neurological:  Positive for tremors and weakness. Negative for dizziness and light-headedness.  Psychiatric/Behavioral:  Positive for confusion. Negative for dysphoric mood and sleep disturbance. The patient is not nervous/anxious.     Immunization History  Administered Date(s) Administered   Fluad Quad(high Dose 65+) 01/29/2022   H1N1 06/13/2008   Influenza Split 03/05/2006, 02/26/2007, 01/26/2008, 01/18/2009, 12/12/2009, 01/16/2011, 01/29/2012, 02/01/2013, 02/08/2014, 02/13/2015, 01/10/2016, 02/23/2017, 02/24/2018, 01/24/2019, 12/22/2019   Influenza, High Dose Seasonal PF 01/30/2021, 02/04/2023   Moderna Covid-19 Fall Seasonal Vaccine 30yrs & older 02/11/2023   Moderna SARS-COV2 Booster Vaccination 09/18/2021   PFIZER(Purple Top)SARS-COV-2 Vaccination 05/04/2019, 05/25/2019, 09/01/2020, 02/11/2022   Pfizer  Covid-19 Vaccine Bivalent Booster 3yrs & up 12/27/2019, 12/26/2020   Pfizer(Comirnaty)Fall Seasonal Vaccine 12 years and older 02/11/2022   Pneumococcal Conjugate-13 02/10/2014   Pneumococcal Polysaccharide-23 06/10/2011   Tdap 06/05/2022   Zoster Recombinant(Shingrix) 07/04/2009, 03/24/2022   Pertinent  Health Maintenance Due  Topic Date Due   INFLUENZA VACCINE  Completed      10/30/2022    2:50 PM 12/01/2022    9:07 AM 02/23/2023    2:34 PM 03/02/2023    3:44 PM 03/12/2023   12:56 PM  Fall Risk  Falls in the past year? 0 0  0 0  Was there an injury with Fall? 0 0 1 0 0  Fall Risk Category Calculator 0 0  0 0  Patient at Risk for Falls Due to No Fall Risks History of fall(s);Impaired balance/gait;Impaired mobility History of  fall(s);Impaired balance/gait;Impaired mobility History of fall(s);Impaired balance/gait;Impaired mobility   Fall risk Follow up Falls evaluation completed Falls evaluation completed;Education provided;Falls prevention discussed Falls evaluation completed;Education provided;Falls prevention discussed Falls evaluation completed;Education provided;Falls prevention discussed Falls evaluation completed   Functional Status Survey:    Vitals:   05/29/23 1529  BP: (!) 151/79  Pulse: 83  Resp: 20  Temp: (!) 97.4 F (36.3 C)  SpO2: 94%  Weight: 184 lb 1.6 oz (83.5 kg)  Height: 5\' 10"  (1.778 m)   Body mass index is 26.42 kg/m. Physical Exam Vitals reviewed.  Constitutional:      General: He is not in acute distress. HENT:     Head: Normocephalic.     Right Ear: There is no impacted cerumen.     Left Ear: There is no impacted cerumen.     Nose: Nose normal.     Mouth/Throat:     Mouth: Mucous membranes are moist.  Eyes:     General:        Right eye: No discharge.        Left eye: No discharge.  Cardiovascular:     Rate and Rhythm: Normal rate and regular rhythm.     Pulses: Normal pulses.     Heart sounds: Normal heart sounds.  Pulmonary:     Effort: Pulmonary effort is normal.     Breath sounds: Normal breath sounds.  Abdominal:     General: Bowel sounds are normal.     Palpations: Abdomen is soft.  Musculoskeletal:     Cervical back: Neck supple.     Right lower leg: Edema present.     Comments: Left BKA, non pitting edema to RLE  Skin:    General: Skin is warm.     Capillary Refill: Capillary refill takes less than 2 seconds.  Neurological:     General: No focal deficit present.     Mental Status: He is alert and oriented to person, place, and time.     Motor: Weakness present.     Gait: Gait abnormal.  Psychiatric:        Mood and Affect: Mood normal.     Labs reviewed: Recent Labs    09/05/22 0000 01/15/23 0000  NA 138 135*  K 4.3 4.3  CL 103 99  CO2  28* 28*  BUN 34* 25*  CREATININE 1.0 1.0  CALCIUM 8.9 8.6*   Recent Labs    09/05/22 0000  AST 13*  ALT 7*  ALKPHOS 73  ALBUMIN 3.8   Recent Labs    09/05/22 0000  WBC 6.0  HGB 13.4*  HCT 40*  PLT 176  Lab Results  Component Value Date   TSH 3.07 09/05/2022   Lab Results  Component Value Date   HGBA1C 6.0 09/05/2022   No results found for: "CHOL", "HDL", "LDLCALC", "LDLDIRECT", "TRIG", "CHOLHDL"  Significant Diagnostic Results in last 30 days:  No results found.  Assessment/Plan 1. Chronic right hip pain (Primary) - ongoing - began 03/2023 - xray negative for fracture and dislocation - working with PT - start tylenol 1000 mg po QAM  2. Parkinson's disease without fluctuating manifestations, unspecified whether dyskinesia present (HCC) - followed by Dr. Arbutus Leas - harder to transfer per nursing  - sleeping more - appears more forgetful - cont sinemet - discuss with family code status  3. S/P below knee amputation, left (HCC) -  d/t osteomyelitis - doig well with prosthetic - cont skilled nursing care  4. Edema of right lower leg - improved with extra furosemide  - cont thigh high compression  5. Essential hypertension - controlled without medication  6. Stage 3b chronic kidney disease (HCC) - encourage hydration with water - avoid NSAIDS  7. Prediabetes - A1c 6.0  - cont diet low in carbs and sugars  8. Iron deficiency anemia secondary to inadequate dietary iron intake - cont ferrous sulfate 3x/week    Family/ staff Communication: plan discussed with patient and nurse  Labs/tests ordered:  none

## 2023-05-29 NOTE — Progress Notes (Signed)
Location:   [facility]ursing Home Room Number: 27-A Place of Service:  SNF [phone]) Provider:  Hazle Nordmann, NP  PCP: [name], MD  Patient Care Team: [name], MD as PCP - General (Internal Medicine) [name], Octaviano Batty, DO as Consulting Physician (Neurology)  Extended Emergency Contact Information Primary Emergency Contact: Maffia,jane Address: [address] [place].          apt [phone] D[place] of [place] Home Phone: [phone] Mobile Phone: [phone] Relation: Spouse Secondary Emergency Contact: Wiegert,kenneth Home Phone: [phone] Mobile Phone: [phone] Relation: Son  Code Status:  FULL CODE Goals of care: Advanced Directive information    05/29/2023    3:50 PM  Advanced Directives  Does Patient Have a Medical Advance Directive? Yes  Type of Advance Directive Living will  Does patient want to make changes to medical advance directive? No - Patient declined     Chief Complaint  Patient presents with   Medical Management of Chronic Issues    Routine Visit.     HPI:  Pt is a 89 y.o. male seen today for medical management of chronic diseases.     Past Medical History:  Diagnosis Date   Acute bronchiolitis    Acute osteomyelitis of calcaneum, left (HCC) 01/11/2021   Anemia    Bursitis of both hips    Complication of anesthesia    wife states pt was a little "loopy" for several days   Dementia (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   Hearing reduced    hearing aid   History of basal cell carcinoma    Hyperlipidemia    Malaise and fatigue    MRSA infection 01/11/2021   Osteoarthritis    hand   Osteoporosis    Palpitation    Parkinson's disease (HCC)    Peripheral vascular disease (HCC)    blood clots   Pre-diabetes    Vitamin D deficiency    Past Surgical History:  Procedure Laterality Date   ABDOMINAL AORTOGRAM W/LOWER EXTREMITY N/A 06/12/2021   Procedure: ABDOMINAL AORTOGRAM W/LOWER EXTREMITY;  Surgeon: Iran Ouch, MD;  Location: [facility];  Service: Cardiovascular;  Laterality: N/A;   AMPUTATION Left 07/03/2021   Procedure: LEFT BELOW KNEE AMPUTATION;  Surgeon: Nadara Mustard, MD;  Location: [facility] OR;  Service: Orthopedics;  Laterality: Left;   CATARACT EXTRACTION  05/31/2015   Eye/right   COLONOSCOPY  02/05/2005, 03/06/2005   normal, 10 years ago   MOHS micrographic surgery     Face surgery   ORIF ACETABULAR FRACTURE Right 10/09/2020   Procedure: OPEN REDUCTION INTERNAL FIXATION (ORIF) ACETABULAR FRACTURE;  Surgeon: [name], MD;  Location: [facility] OR;  Service: Orthopedics;  Laterality: Right;    No Known Allergies  Allergies as of 05/29/2023   No Known Allergies      Medication List        Accurate as of May 29, 2023  3:51 PM. If you have any questions, ask your nurse or doctor.          acetaminophen 500 MG tablet Commonly known as: TYLENOL Take 1,000 mg by mouth as needed.   acetaminophen 500 MG tablet Commonly known as: TYLENOL Take 1,000 mg by mouth every morning.   albuterol 108 (90 Base) MCG/ACT inhaler Commonly known as: VENTOLIN HFA Inhale 2 puffs into the lungs every 4 (four) hours as needed for wheezing or shortness of breath.   Artificial Tears 83-15 % Oint Place  1 Application into both eyes at bedtime.   ARTIFICIAL TEARS OP Place 1 drop into both eyes every morning.   Calcium 600 600 MG Tabs tablet Generic drug: calcium carbonate Take 600 mg by mouth every morning.   CALCIUM CARBONATE ANTACID PO Take 1 tablet by mouth every 8 (eight) hours as needed.   calcium carbonate 500 MG chewable tablet Commonly known as: TUMS - dosed in mg elemental calcium Chew 1 tablet by mouth every 8 (eight) hours as needed for indigestion or heartburn.   carbidopa-levodopa 50-200 MG tablet Commonly known as: SINEMET CR Take 1 tablet by mouth at bedtime.   Carbidopa-Levodopa ER 25-100 MG tablet controlled release Commonly known as: SINEMET CR 1 at  8am/11am/2pm/5pm   cyanocobalamin 1000 MCG tablet Commonly known as: VITAMIN B12 Take 1,000 mcg by mouth daily.   ferrous sulfate 325 (65 FE) MG tablet Take 325 mg by mouth 2 (two) times a week. Monday & Thursday   GUAIFENESIN PO Take 10 mLs by mouth every 6 (six) hours as needed.   nystatin powder Commonly known as: MYCOSTATIN/NYSTOP Apply 1 Application topically every 12 (twelve) hours as needed.   polyethylene glycol 17 g packet Commonly known as: MIRALAX / GLYCOLAX Take 17 g by mouth daily.   PRESERVISION AREDS 2 PO Take 1 capsule by mouth in the morning and at bedtime.   senna 8.6 MG tablet Commonly known as: SENOKOT Take 1 tablet by mouth every other day.   torsemide 20 MG tablet Commonly known as: DEMADEX Take 20 mg by mouth 3 (three) times a week.   vitamin C 1000 MG tablet Take 1,000 mg by mouth daily.   Vitamin D (Cholecalciferol) 25 MCG (1000 UT) Tabs Take 1,000 Units by mouth daily.   zinc oxide 20 % ointment Apply 1 application. topically See admin instructions. To buttocks after every incontinent episode and as needed for redness.        Review of Systems  Immunization History  Administered Date(s) Administered   Fluad Quad(high Dose 65+) 01/29/2022   H1N1 06/13/2008   Influenza Split 03/05/2006, 02/26/2007, 01/26/2008, 01/18/2009, 12/12/2009, 01/16/2011, 01/29/2012, 02/01/2013, 02/08/2014, 02/13/2015, 01/10/2016, 02/23/2017, 02/24/2018, 01/24/2019, 12/22/2019   Influenza, High Dose Seasonal PF 01/30/2021, 02/04/2023   Moderna Covid-19 Fall Seasonal Vaccine 46yrs & older 02/11/2023   Moderna SARS-COV2 Booster Vaccination 09/18/2021   PFIZER(Purple Top)SARS-COV-2 Vaccination 05/04/2019, 05/25/2019, 09/01/2020, 02/11/2022   Pfizer Covid-19 Vaccine Bivalent Booster 79yrs & up 12/27/2019, 12/26/2020   Pfizer(Comirnaty)Fall Seasonal Vaccine 12 years and older 02/11/2022   Pneumococcal Conjugate-13 02/10/2014   Pneumococcal Polysaccharide-23  06/10/2011   Tdap 06/05/2022   Zoster Recombinant(Shingrix) 07/04/2009, 03/24/2022   Pertinent  Health Maintenance Due  Topic Date Due   INFLUENZA VACCINE  Completed      10/30/2022    2:50 PM 12/01/2022    9:07 AM 02/23/2023    2:34 PM 03/02/2023    3:44 PM 03/12/2023   12:56 PM  Fall Risk  Falls in the past year? 0 0  0 0  Was there an injury with Fall? 0 0 1 0 0  Fall Risk Category Calculator 0 0  0 0  Patient at Risk for Falls Due to No Fall Risks History of fall(s);Impaired balance/gait;Impaired mobility History of fall(s);Impaired balance/gait;Impaired mobility History of fall(s);Impaired balance/gait;Impaired mobility   Fall risk Follow up Falls evaluation completed Falls evaluation completed;Education provided;Falls prevention discussed Falls evaluation completed;Education provided;Falls prevention discussed Falls evaluation completed;Education provided;Falls prevention discussed Falls evaluation completed   Functional Status Survey:  Vitals:   05/29/23 1529  BP: (!) 151/79  Pulse: 83  Resp: 20  Temp: (!) 97.4 F (36.3 C)  SpO2: 94%  Weight: 184 lb 1.6 oz (83.5 kg)  Height: 5\' 10"  (1.778 m)   Body mass index is 26.42 kg/m. Physical Exam  Labs reviewed: Recent Labs    09/05/22 0000 01/15/23 0000  NA 138 135*  K 4.3 4.3  CL 103 99  CO2 28* 28*  BUN 34* 25*  CREATININE 1.0 1.0  CALCIUM 8.9 8.6*   Recent Labs    09/05/22 0000  AST 13*  ALT 7*  ALKPHOS 73  ALBUMIN 3.8   Recent Labs    09/05/22 0000  WBC 6.0  HGB 13.4*  HCT 40*  PLT 176   Lab Results  Component Value Date   TSH 3.07 09/05/2022   Lab Results  Component Value Date   HGBA1C 6.0 09/05/2022   No results found for: "CHOL", "HDL", "LDLCALC", "LDLDIRECT", "TRIG", "CHOLHDL"  Significant Diagnostic Results in last 30 days:  No results found.  Assessment/Plan There are no diagnoses linked to this encounter.   Family/ staff Communication:   Labs/tests ordered:

## 2023-06-05 ENCOUNTER — Encounter: Payer: Self-pay | Admitting: Orthopedic Surgery

## 2023-06-05 ENCOUNTER — Non-Acute Institutional Stay (SKILLED_NURSING_FACILITY): Payer: Self-pay | Admitting: Orthopedic Surgery

## 2023-06-05 DIAGNOSIS — Z66 Do not resuscitate: Secondary | ICD-10-CM

## 2023-06-05 DIAGNOSIS — K30 Functional dyspepsia: Secondary | ICD-10-CM

## 2023-06-05 DIAGNOSIS — Z7189 Other specified counseling: Secondary | ICD-10-CM

## 2023-06-05 MED ORDER — OMEPRAZOLE 20 MG PO CPDR: 20.0000 mg | DELAYED_RELEASE_CAPSULE | Freq: Every day | ORAL | Status: DC

## 2023-06-05 NOTE — Progress Notes (Signed)
 Location:  [facility] Room Number: 27/A Place of Service:  SNF [phone]) Provider:  Octavia Heir, NP   [name], MD  Patient Care Team: [name], MD as PCP - General (Internal Medicine) [name], Octaviano Batty, DO as Consulting Physician (Neurology)  Extended Emergency Contact Information Primary Emergency Contact: Annunziato,jane Address: [address] [place].          apt [phone] [place] of [place] Home Phone: [phone] Mobile Phone: [phone] Relation: Spouse Secondary Emergency Contact: Naeem,kenneth Home Phone: [phone] Mobile Phone: [phone] Relation: Son  Code Status:  DNR Goals of care: Advanced Directive information    05/29/2023    3:50 PM  Advanced Directives  Does Patient Have a Medical Advance Directive? Yes  Type of Advance Directive Living will  Does patient want to make changes to medical advance directive? No - Patient declined     Chief Complaint  Patient presents with   Acute Visit    Advanced directives, indigestion    HPI:  Pt is a 89 y.o. male seen today for acute visit due to advanced directives counseling and indigestion.   He currently resides on the skilled nursing unit at [facility]. PMH: HTN, HLD, HFpEF, parkinson's, PBA, acute DVT right lower leg 12/27/2020, osteoporosis (past Fosamax use), BPH, CKD, ORIF right acetabular 10/2020, PNA LLL 12/29/2022, and unstable gait.   Patient has been a full code since SNF admission. Due to advanced age and progression of parkinson's they agreed to discuss goals of care today. Code status including CPR and resuscitation discussed. They agree to change code status to DNR. Patient states " he would like to promote natural death." MOST form also discussed and completed. He does not want future hospitalizations. He agrees to antibiotics and IVF. Refuses feeding tube.   02/21 he reported increased indigestion. He was prescribed TUMS without success. This  morning he continues to have reflux. He is avoiding food triggers.    Past Medical History:  Diagnosis Date   Acute bronchiolitis    Acute osteomyelitis of calcaneum, left (HCC) 01/11/2021   Anemia    Bursitis of both hips    Complication of anesthesia    wife states pt was a little "loopy" for several days   Dementia (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   Hearing reduced    hearing aid   History of basal cell carcinoma    Hyperlipidemia    Malaise and fatigue    MRSA infection 01/11/2021   Osteoarthritis    hand   Osteoporosis    Palpitation    Parkinson's disease (HCC)    Peripheral vascular disease (HCC)    blood clots   Pre-diabetes    Vitamin D deficiency    Past Surgical History:  Procedure Laterality Date   ABDOMINAL AORTOGRAM W/LOWER EXTREMITY N/A 06/12/2021   Procedure: ABDOMINAL AORTOGRAM W/LOWER EXTREMITY;  Surgeon: Iran Ouch, MD;  Location: [facility];  Service: Cardiovascular;  Laterality: N/A;   AMPUTATION Left 07/03/2021   Procedure: LEFT BELOW KNEE AMPUTATION;  Surgeon: Nadara Mustard, MD;  Location: [facility] OR;  Service: Orthopedics;  Laterality: Left;   CATARACT EXTRACTION  05/31/2015   Eye/right   COLONOSCOPY  02/05/2005, 03/06/2005   normal, 10 years ago   MOHS micrographic surgery     Face surgery   ORIF ACETABULAR FRACTURE Right 10/09/2020   Procedure: OPEN REDUCTION INTERNAL FIXATION (ORIF) ACETABULAR FRACTURE;  Surgeon: [name], MD;  Location: [facility] OR;  Service: Orthopedics;  Laterality: Right;    No Known Allergies  Outpatient Encounter Medications as of 06/05/2023  Medication Sig   acetaminophen (TYLENOL) 500 MG tablet Take 1,000 mg by mouth as needed.   acetaminophen (TYLENOL) 500 MG tablet Take 1,000 mg by mouth every morning.   albuterol (VENTOLIN HFA) 108 (90 Base) MCG/ACT inhaler Inhale 2 puffs into the lungs every 4 (four) hours as needed for wheezing or shortness of breath.   Ascorbic Acid (VITAMIN C) 1000 MG tablet Take 1,000 mg by  mouth daily.   calcium carbonate (CALCIUM 600) 600 MG TABS tablet Take 600 mg by mouth every morning.   calcium carbonate (TUMS - DOSED IN MG ELEMENTAL CALCIUM) 500 MG chewable tablet Chew 1 tablet by mouth every 8 (eight) hours as needed for indigestion or heartburn.   CALCIUM CARBONATE ANTACID PO Take 1 tablet by mouth every 8 (eight) hours as needed.   carbidopa-levodopa (SINEMET CR) 50-200 MG tablet Take 1 tablet by mouth at bedtime.   Carbidopa-Levodopa ER (SINEMET CR) 25-100 MG tablet controlled release 1 at 8am/11am/2pm/5pm   Carboxymethylcellulose Sodium (ARTIFICIAL TEARS OP) Place 1 drop into both eyes every morning.   ferrous sulfate 325 (65 FE) MG tablet Take 325 mg by mouth 2 (two) times a week. Monday & Thursday   GUAIFENESIN PO Take 10 mLs by mouth every 6 (six) hours as needed.   Multiple Vitamins-Minerals (PRESERVISION AREDS 2 PO) Take 1 capsule by mouth in the morning and at bedtime.   nystatin (MYCOSTATIN/NYSTOP) powder Apply 1 Application topically every 12 (twelve) hours as needed.   polyethylene glycol (MIRALAX / GLYCOLAX) 17 g packet Take 17 g by mouth daily.   senna (SENOKOT) 8.6 MG tablet Take 1 tablet by mouth every other day.   torsemide (DEMADEX) 20 MG tablet Take 20 mg by mouth 3 (three) times a week.   vitamin B-12 (CYANOCOBALAMIN) 1000 MCG tablet Take 1,000 mcg by mouth daily.   Vitamin D, Cholecalciferol, 25 MCG (1000 UT) TABS Take 1,000 Units by mouth daily.   White Petrolatum-Mineral Oil (ARTIFICIAL TEARS) 83-15 % OINT Place 1 Application into both eyes at bedtime.   zinc oxide 20 % ointment Apply 1 application. topically See admin instructions. To buttocks after every incontinent episode and as needed for redness.   No facility-administered encounter medications on file as of 06/05/2023.    Review of Systems  Constitutional: Negative.   Respiratory: Negative.    Cardiovascular: Negative.   Gastrointestinal:  Negative for blood in stool and vomiting.        Acid reflux  Neurological:  Positive for weakness.  Psychiatric/Behavioral:  Positive for confusion and dysphoric mood. Negative for sleep disturbance. The patient is not nervous/anxious.     Immunization History  Administered Date(s) Administered   Fluad Quad(high Dose 65+) 01/29/2022   H1N1 06/13/2008   Influenza Split 03/05/2006, 02/26/2007, 01/26/2008, 01/18/2009, 12/12/2009, 01/16/2011, 01/29/2012, 02/01/2013, 02/08/2014, 02/13/2015, 01/10/2016, 02/23/2017, 02/24/2018, 01/24/2019, 12/22/2019   Influenza, High Dose Seasonal PF 01/30/2021, 02/04/2023   Moderna Covid-19 Fall Seasonal Vaccine 23yrs & older 02/11/2023   Moderna SARS-COV2 Booster Vaccination 09/18/2021   PFIZER(Purple Top)SARS-COV-2 Vaccination 05/04/2019, 05/25/2019, 09/01/2020, 02/11/2022   Pfizer Covid-19 Vaccine Bivalent Booster 60yrs & up 12/27/2019, 12/26/2020   Pfizer(Comirnaty)Fall Seasonal Vaccine 12 years and older 02/11/2022   Pneumococcal Conjugate-13 02/10/2014   Pneumococcal Polysaccharide-23 06/10/2011   Tdap 06/05/2022   Zoster Recombinant(Shingrix) 07/04/2009, 03/24/2022   Pertinent  Health Maintenance Due  Topic Date Due  INFLUENZA VACCINE  Completed      10/30/2022    2:50 PM 12/01/2022    9:07 AM 02/23/2023    2:34 PM 03/02/2023    3:44 PM 03/12/2023   12:56 PM  Fall Risk  Falls in the past year? 0 0  0 0  Was there an injury with Fall? 0 0 1 0 0  Fall Risk Category Calculator 0 0  0 0  Patient at Risk for Falls Due to No Fall Risks History of fall(s);Impaired balance/gait;Impaired mobility History of fall(s);Impaired balance/gait;Impaired mobility History of fall(s);Impaired balance/gait;Impaired mobility   Fall risk Follow up Falls evaluation completed Falls evaluation completed;Education provided;Falls prevention discussed Falls evaluation completed;Education provided;Falls prevention discussed Falls evaluation completed;Education provided;Falls prevention discussed Falls evaluation completed    Functional Status Survey:    Vitals:   06/05/23 1305  BP: 138/62  Pulse: 87  Resp: 16  Temp: 98 F (36.7 C)  SpO2: 98%  Weight: 184 lb 1.6 oz (83.5 kg)  Height: 5\' 10"  (1.778 m)   Body mass index is 26.42 kg/m. Physical Exam Vitals reviewed.  Constitutional:      General: He is not in acute distress. HENT:     Head: Normocephalic.  Eyes:     General:        Right eye: No discharge.        Left eye: No discharge.  Cardiovascular:     Rate and Rhythm: Normal rate and regular rhythm.     Pulses: Normal pulses.     Heart sounds: Normal heart sounds.  Pulmonary:     Effort: Pulmonary effort is normal.     Breath sounds: Normal breath sounds.  Abdominal:     Palpations: Abdomen is soft.  Musculoskeletal:     Cervical back: Neck supple.  Skin:    General: Skin is warm.     Capillary Refill: Capillary refill takes less than 2 seconds.  Neurological:     General: No focal deficit present.     Mental Status: He is alert.     Motor: Weakness present.     Gait: Gait abnormal.  Psychiatric:        Mood and Affect: Mood normal.     Labs reviewed: Recent Labs    09/05/22 0000 01/15/23 0000  NA 138 135*  K 4.3 4.3  CL 103 99  CO2 28* 28*  BUN 34* 25*  CREATININE 1.0 1.0  CALCIUM 8.9 8.6*   Recent Labs    09/05/22 0000  AST 13*  ALT 7*  ALKPHOS 73  ALBUMIN 3.8   Recent Labs    09/05/22 0000  WBC 6.0  HGB 13.4*  HCT 40*  PLT 176   Lab Results  Component Value Date   TSH 3.07 09/05/2022   Lab Results  Component Value Date   HGBA1C 6.0 09/05/2022   No results found for: "CHOL", "HDL", "LDLCALC", "LDLDIRECT", "TRIG", "CHOLHDL"  Significant Diagnostic Results in last 30 days:  No results found.  Assessment/Plan 1. Advanced directives, counseling/discussion (Primary) - goals of care discussed due to progressing parkinson's - will make DNR - does not want future hospitalizations - agreeable to antibiotics and IVF, no feeding tubes - DNR  and MOST completed   2. Acid indigestion - ongoing - no relief with TUMS - start omeprazole daily before breakfast - omeprazole (PRILOSEC) 20 MG capsule; Take 1 capsule (20 mg total) by mouth daily before breakfast.  3. Do not resuscitate  Total time: 45 minutes.  Family/ staff Communication: plan discussed with patient and nurse  Labs/tests ordered:  none

## 2023-06-22 ENCOUNTER — Non-Acute Institutional Stay (SKILLED_NURSING_FACILITY): Payer: Self-pay | Admitting: Orthopedic Surgery

## 2023-06-22 ENCOUNTER — Encounter: Payer: Self-pay | Admitting: Orthopedic Surgery

## 2023-06-22 DIAGNOSIS — H6123 Impacted cerumen, bilateral: Secondary | ICD-10-CM

## 2023-06-22 DIAGNOSIS — G20A2 Parkinson's disease without dyskinesia, with fluctuations: Secondary | ICD-10-CM | POA: Diagnosis not present

## 2023-06-22 DIAGNOSIS — Z89512 Acquired absence of left leg below knee: Secondary | ICD-10-CM

## 2023-06-22 DIAGNOSIS — R7303 Prediabetes: Secondary | ICD-10-CM

## 2023-06-22 DIAGNOSIS — M25551 Pain in right hip: Secondary | ICD-10-CM

## 2023-06-22 DIAGNOSIS — D508 Other iron deficiency anemias: Secondary | ICD-10-CM

## 2023-06-22 DIAGNOSIS — G8929 Other chronic pain: Secondary | ICD-10-CM

## 2023-06-22 DIAGNOSIS — R600 Localized edema: Secondary | ICD-10-CM

## 2023-06-23 MED ORDER — DEBROX 6.5 % OT SOLN: 5.0000 [drp] | Freq: Every day | OTIC | Status: AC

## 2023-06-23 MED ORDER — CARBIDOPA-LEVODOPA 25-100 MG PO TABS
1.0000 | ORAL_TABLET | Freq: Four times a day (QID) | ORAL | Status: DC
Start: 2023-06-23 — End: 2023-07-23

## 2023-06-23 NOTE — Progress Notes (Signed)
 Location:  [facility] Room Number: 27/A Place of Service:  SNF [phone]) Provider:  Octavia Heir, NP   [name], MD  Patient Care Team: [name], MD as PCP - General (Internal Medicine) [name], Octaviano Batty, DO as Consulting Physician (Neurology)  Extended Emergency Contact Information Primary Emergency Contact: Unrein,jane Address: [address] [place].          apt [phone] [place] of [place] Home Phone: [phone] Mobile Phone: [phone] Relation: Spouse Secondary Emergency Contact: Beattie,kenneth Home Phone: [phone] Mobile Phone: [phone] Relation: Son  Code Status:  DNR Goals of care: Advanced Directive information    05/29/2023    3:50 PM  Advanced Directives  Does Patient Have a Medical Advance Directive? Yes  Type of Advance Directive Living will  Does patient want to make changes to medical advance directive? No - Patient declined     Chief Complaint  Patient presents with   Medical Management of Chronic Issues    HPI:  Pt is a 89 y.o. male seen today for medical management of chronic diseases.    He currently resides on the skilled nursing unit at [facility]. PMH: HTN, HLD, HFpEF, parkinson's, PBA, acute DVT right lower leg 12/27/2020, osteoporosis (past Fosamax use), BPH, CKD, ORIF right acetabular 10/2020, PNA LLL 12/29/2022, and unstable gait.    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 Lajoyce Corners due to ongoing osteomyelitis (+MRSA) of left heel, no phantom pain, uses prosthesis to ambulate/ wheelchair for long distances Right hip pain-  noted 12/12 after using Nustep machine, right hip xray negative for fracture or dislocation, unsuccessful prednisone taper, remains on scheduled tylenol QAM Edema- LV EF 60-65% 02/20/2021,  improved since amputation, compression hose to right leg daily, torsemide reduced to 3x/week 05/30 Prediabetes-  A1c 6.0 (05/31)> was 5.8 01/16/2022 Iron deficiency anemia- hgb 13.4 09/05/2022, remains on ferrous sulfate M/W/F  Recent blood pressures:  03/11- 98/55  03/04- 131/77  03/03- 148/90  Recent weights:  03/06- 177.4 lbs  02/04- 184.1 lbs  01/07- 187.2 lbs   Past Medical History:  Diagnosis Date   Acute bronchiolitis    Acute osteomyelitis of calcaneum, left (HCC) 01/11/2021   Anemia    Bursitis of both hips    Complication of anesthesia    wife states pt was a little "loopy" for several days   Dementia (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   Hearing reduced    hearing aid   History of basal cell carcinoma    Hyperlipidemia    Malaise and fatigue    MRSA infection 01/11/2021   Osteoarthritis    hand   Osteoporosis    Palpitation    Parkinson's disease (HCC)    Peripheral vascular disease (HCC)    blood clots   Pre-diabetes    Vitamin D deficiency    Past Surgical History:  Procedure Laterality Date   ABDOMINAL AORTOGRAM W/LOWER EXTREMITY N/A 06/12/2021   Procedure: ABDOMINAL AORTOGRAM W/LOWER EXTREMITY;  Surgeon: Iran Ouch, MD;  Location: [facility] INVASIVE CV LAB;  Service: Cardiovascular;  Laterality: N/A;   AMPUTATION Left 07/03/2021   Procedure: LEFT BELOW KNEE AMPUTATION;  Surgeon: Nadara Mustard, MD;  Location: [facility] OR;  Service: Orthopedics;  Laterality: Left;   CATARACT EXTRACTION  05/31/2015   Eye/right  COLONOSCOPY  02/05/2005, 03/06/2005   normal, 10 years ago   MOHS micrographic surgery     Face surgery   ORIF ACETABULAR FRACTURE Right 10/09/2020   Procedure: OPEN REDUCTION INTERNAL FIXATION (ORIF) ACETABULAR FRACTURE;  Surgeon: [name], MD;  Location: [facility] OR;  Service: Orthopedics;  Laterality: Right;    No Known Allergies  Outpatient Encounter Medications as of 06/22/2023  Medication Sig   acetaminophen (TYLENOL) 500 MG tablet Take 1,000 mg by  mouth as needed.   acetaminophen (TYLENOL) 500 MG tablet Take 1,000 mg by mouth every morning.   albuterol (VENTOLIN HFA) 108 (90 Base) MCG/ACT inhaler Inhale 2 puffs into the lungs every 4 (four) hours as needed for wheezing or shortness of breath.   Ascorbic Acid (VITAMIN C) 1000 MG tablet Take 1,000 mg by mouth daily.   calcium carbonate (CALCIUM 600) 600 MG TABS tablet Take 600 mg by mouth every morning.   calcium carbonate (TUMS - DOSED IN MG ELEMENTAL CALCIUM) 500 MG chewable tablet Chew 1 tablet by mouth every 8 (eight) hours as needed for indigestion or heartburn.   CALCIUM CARBONATE ANTACID PO Take 1 tablet by mouth every 8 (eight) hours as needed.   carbidopa-levodopa (SINEMET CR) 50-200 MG tablet Take 1 tablet by mouth at bedtime.   Carbidopa-Levodopa ER (SINEMET CR) 25-100 MG tablet controlled release 1 at 8am/11am/2pm/5pm   Carboxymethylcellulose Sodium (ARTIFICIAL TEARS OP) Place 1 drop into both eyes every morning.   ferrous sulfate 325 (65 FE) MG tablet Take 325 mg by mouth 2 (two) times a week. Monday & Thursday   GUAIFENESIN PO Take 10 mLs by mouth every 6 (six) hours as needed.   Multiple Vitamins-Minerals (PRESERVISION AREDS 2 PO) Take 1 capsule by mouth in the morning and at bedtime.   nystatin (MYCOSTATIN/NYSTOP) powder Apply 1 Application topically every 12 (twelve) hours as needed.   omeprazole (PRILOSEC) 20 MG capsule Take 1 capsule (20 mg total) by mouth daily before breakfast.   polyethylene glycol (MIRALAX / GLYCOLAX) 17 g packet Take 17 g by mouth daily.   senna (SENOKOT) 8.6 MG tablet Take 1 tablet by mouth every other day.   torsemide (DEMADEX) 20 MG tablet Take 20 mg by mouth 3 (three) times a week.   vitamin B-12 (CYANOCOBALAMIN) 1000 MCG tablet Take 1,000 mcg by mouth daily.   Vitamin D, Cholecalciferol, 25 MCG (1000 UT) TABS Take 1,000 Units by mouth daily.   White Petrolatum-Mineral Oil (ARTIFICIAL TEARS) 83-15 % OINT Place 1 Application into both eyes at  bedtime.   zinc oxide 20 % ointment Apply 1 application. topically See admin instructions. To buttocks after every incontinent episode and as needed for redness.   No facility-administered encounter medications on file as of 06/22/2023.    Review of Systems  Unable to perform ROS: Age    Immunization History  Administered Date(s) Administered   Fluad Quad(high Dose 65+) 01/29/2022   H1N1 06/13/2008   Influenza Split 03/05/2006, 02/26/2007, 01/26/2008, 01/18/2009, 12/12/2009, 01/16/2011, 01/29/2012, 02/01/2013, 02/08/2014, 02/13/2015, 01/10/2016, 02/23/2017, 02/24/2018, 01/24/2019, 12/22/2019   Influenza, High Dose Seasonal PF 01/30/2021, 02/04/2023   Moderna Covid-19 Fall Seasonal Vaccine 70yrs & older 02/11/2023   Moderna SARS-COV2 Booster Vaccination 09/18/2021   PFIZER(Purple Top)SARS-COV-2 Vaccination 05/04/2019, 05/25/2019, 09/01/2020, 02/11/2022   Pfizer Covid-19 Vaccine Bivalent Booster 90yrs & up 12/27/2019, 12/26/2020   Pfizer(Comirnaty)Fall Seasonal Vaccine 12 years and older 02/11/2022   Pneumococcal Conjugate-13 02/10/2014   Pneumococcal Polysaccharide-23 06/10/2011   Tdap 06/05/2022   Zoster Recombinant(Shingrix) 07/04/2009, 03/24/2022  Pertinent  Health Maintenance Due  Topic Date Due   INFLUENZA VACCINE  Completed      10/30/2022    2:50 PM 12/01/2022    9:07 AM 02/23/2023    2:34 PM 03/02/2023    3:44 PM 03/12/2023   12:56 PM  Fall Risk  Falls in the past year? 0 0  0 0  Was there an injury with Fall? 0 0 1 0 0  Fall Risk Category Calculator 0 0  0 0  Patient at Risk for Falls Due to No Fall Risks History of fall(s);Impaired balance/gait;Impaired mobility History of fall(s);Impaired balance/gait;Impaired mobility History of fall(s);Impaired balance/gait;Impaired mobility   Fall risk Follow up Falls evaluation completed Falls evaluation completed;Education provided;Falls prevention discussed Falls evaluation completed;Education provided;Falls prevention discussed  Falls evaluation completed;Education provided;Falls prevention discussed Falls evaluation completed   Functional Status Survey:    Vitals:   06/22/23 1538  BP: 121/76  Pulse: 94  Resp: 18  Temp: (!) 96.6 F (35.9 C)  SpO2: 95%  Weight: 175 lb 1.6 oz (79.4 kg)  Height: 5\' 10"  (1.778 m)   Body mass index is 25.12 kg/m. Physical Exam Vitals reviewed.  Constitutional:      General: He is not in acute distress. HENT:     Head: Normocephalic.     Right Ear: There is impacted cerumen.     Left Ear: There is impacted cerumen.     Nose: Nose normal.     Mouth/Throat:     Mouth: Mucous membranes are moist.  Eyes:     General:        Right eye: No discharge.        Left eye: No discharge.  Cardiovascular:     Rate and Rhythm: Normal rate and regular rhythm.     Pulses: Normal pulses.     Heart sounds: Murmur heard.  Pulmonary:     Effort: Pulmonary effort is normal. No respiratory distress.     Breath sounds: Normal breath sounds. No wheezing or rales.  Abdominal:     General: Bowel sounds are normal.     Palpations: Abdomen is soft.  Musculoskeletal:     Cervical back: Neck supple.     Right lower leg: No edema.     Comments: LBKA  Skin:    General: Skin is warm.     Capillary Refill: Capillary refill takes less than 2 seconds.  Neurological:     General: No focal deficit present.     Mental Status: He is alert and oriented to person, place, and time.     Motor: Weakness present.     Gait: Gait abnormal.     Comments: Slowed speech and responses, increased rigidity to extremities  Psychiatric:        Mood and Affect: Mood normal.     Labs reviewed: Recent Labs    09/05/22 0000 01/15/23 0000  NA 138 135*  K 4.3 4.3  CL 103 99  CO2 28* 28*  BUN 34* 25*  CREATININE 1.0 1.0  CALCIUM 8.9 8.6*   Recent Labs    09/05/22 0000  AST 13*  ALT 7*  ALKPHOS 73  ALBUMIN 3.8   Recent Labs    09/05/22 0000  WBC 6.0  HGB 13.4*  HCT 40*  PLT 176   Lab  Results  Component Value Date   TSH 3.07 09/05/2022   Lab Results  Component Value Date   HGBA1C 6.0 09/05/2022   No results found for: "CHOL", "  HDL", "LDLCALC", "LDLDIRECT", "TRIG", "CHOLHDL"  Significant Diagnostic Results in last 30 days:  No results found.  Assessment/Plan 1. Bilateral impacted cerumen (Primary) - flush ears when Debrox complete - scheduled to have hearing checked soon per wife - carbamide peroxide (DEBROX) 6.5 % OTIC solution; Place 5 drops into both ears at bedtime for 5 days.  2. Parkinson's disease with fluctuating manifestations, unspecified whether dyskinesia present (HCC) - increased rigidity, slowed speech, unable to use sit to stand macnhine - Dr. Arbutus Leas recommended stopping carbidopa ER and starting carbidopa IR - cont skilled nursing - carbidopa-levodopa (SINEMET IR) 25-100 MG per tablet immediate release 1 tablet  3. S/P below knee amputation, left (HCC)   d/t osteomyelitis - doing well with prosthetic - cont skilled nursing care  4. Chronic right hip pain - cont scheduled tylenol in AM  5. Edema of right lower leg - cont torsemide  6. Prediabetes - A1c stable - not on medication  7. Iron deficiency anemia secondary to inadequate dietary iron intake - hgb stable  - cont ferrous sulfate    Family/ staff Communication: plan discussed with patient and nurse  Labs/tests ordered:  cbc/diff, cmp, TSH, A1c 04/03

## 2023-06-27 ENCOUNTER — Telehealth: Payer: Self-pay | Admitting: Adult Health

## 2023-06-27 MED ORDER — IPRATROPIUM-ALBUTEROL 0.5-2.5 (3) MG/3ML IN SOLN: 3.0000 mL | Freq: Three times a day (TID) | RESPIRATORY_TRACT | Status: DC | PRN

## 2023-06-27 NOTE — Telephone Encounter (Signed)
 Nurse from [facility] called to report this resident has a cough with excessive sputum production. Cough syrup has been ineffective. VItals  BP 120/72 HR 82 T 97.7 Sat 95%  Request for CXR and duonebs Order given  Also check for Covi/flu swab

## 2023-06-28 MED ORDER — DOXYCYCLINE HYCLATE 100 MG PO TABS: 100.0000 mg | ORAL_TABLET | Freq: Two times a day (BID) | ORAL | Status: AC

## 2023-06-28 NOTE — Addendum Note (Signed)
 Addended by: Esmond Camper on: 06/28/2023 07:45 AM   Modules accepted: Orders

## 2023-06-28 NOTE — Telephone Encounter (Signed)
 Nurse called to report the CXR findings indicating CHF vs pna with clinical correlation recommended.  Doxycycline ordered and one additional dose of torsemide F/U with provider on Monday 06/29/23

## 2023-06-29 ENCOUNTER — Non-Acute Institutional Stay (SKILLED_NURSING_FACILITY): Payer: Self-pay | Admitting: Orthopedic Surgery

## 2023-06-29 ENCOUNTER — Encounter: Payer: Self-pay | Admitting: Orthopedic Surgery

## 2023-06-29 DIAGNOSIS — R051 Acute cough: Secondary | ICD-10-CM | POA: Diagnosis not present

## 2023-06-29 NOTE — Progress Notes (Signed)
 Location:  [facility] Room Number: 27/A Place of Service:  SNF [phone]) Provider:  Octavia Heir, NP   [name], MD  Patient Care Team: [name], MD as PCP - General (Internal Medicine) [name], Octaviano Batty, DO as Consulting Physician (Neurology)  Extended Emergency Contact Information Primary Emergency Contact: Hauss,jane Address: [address] [place].          apt [phone] [place] of [place] Home Phone: [phone] Mobile Phone: [phone] Relation: Spouse Secondary Emergency Contact: Mcmillion,kenneth Home Phone: [phone] Mobile Phone: [phone] Relation: Son  Code Status:  DNR Goals of care: Advanced Directive information    05/29/2023    3:50 PM  Advanced Directives  Does Patient Have a Medical Advance Directive? Yes  Type of Advance Directive Living will  Does patient want to make changes to medical advance directive? No - Patient declined     Chief Complaint  Patient presents with   Acute Visit    cough    HPI:  Pt is a 89 y.o. male seen today for acute visit due to cough.   He currently resides on the skilled nursing unit at [facility]. PMH: HTN, HLD, HFpEF, parkinson's, PBA, acute DVT right lower leg 12/27/2020, osteoporosis (past Fosamax use), BPH, CKD, ORIF right acetabular 10/2020, PNA LLL 12/29/2022, and unstable gait.   03/22 he had increased wheezing, congestion and cough. Covid test negative. On call provider ordered CXR. CXR revealed mild CHF versus interstitial pneumonia. He was started on doxycycline and given extra dose of torsemide. Today, wife and patient confirm improved cough and symptoms. He appears more alert from weekend. Appetite improved. Afebrile. Vitals stable.    Past Medical History:  Diagnosis Date   Acute bronchiolitis    Acute osteomyelitis of calcaneum, left (HCC) 01/11/2021   Anemia    Bursitis of both hips    Complication of anesthesia    wife states pt was a little  "loopy" for several days   Dementia (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   Hearing reduced    hearing aid   History of basal cell carcinoma    Hyperlipidemia    Malaise and fatigue    MRSA infection 01/11/2021   Osteoarthritis    hand   Osteoporosis    Palpitation    Parkinson's disease (HCC)    Peripheral vascular disease (HCC)    blood clots   Pre-diabetes    Vitamin D deficiency    Past Surgical History:  Procedure Laterality Date   ABDOMINAL AORTOGRAM W/LOWER EXTREMITY N/A 06/12/2021   Procedure: ABDOMINAL AORTOGRAM W/LOWER EXTREMITY;  Surgeon: Iran Ouch, MD;  Location: [facility] INVASIVE CV LAB;  Service: Cardiovascular;  Laterality: N/A;   AMPUTATION Left 07/03/2021   Procedure: LEFT BELOW KNEE AMPUTATION;  Surgeon: Nadara Mustard, MD;  Location: [facility] OR;  Service: Orthopedics;  Laterality: Left;   CATARACT EXTRACTION  05/31/2015   Eye/right   COLONOSCOPY  02/05/2005, 03/06/2005   normal, 10 years ago   MOHS micrographic surgery     Face surgery   ORIF ACETABULAR FRACTURE Right 10/09/2020   Procedure: OPEN REDUCTION INTERNAL FIXATION (ORIF) ACETABULAR FRACTURE;  Surgeon: [name], MD;  Location: [facility] OR;  Service: Orthopedics;  Laterality: Right;    No Known Allergies  Outpatient Encounter Medications as of 06/29/2023  Medication Sig   acetaminophen (TYLENOL) 500 MG tablet Take 1,000 mg by mouth as needed.   acetaminophen (TYLENOL) 500 MG tablet Take 1,000  mg by mouth every morning.   albuterol (VENTOLIN HFA) 108 (90 Base) MCG/ACT inhaler Inhale 2 puffs into the lungs every 4 (four) hours as needed for wheezing or shortness of breath.   Ascorbic Acid (VITAMIN C) 1000 MG tablet Take 1,000 mg by mouth daily.   calcium carbonate (CALCIUM 600) 600 MG TABS tablet Take 600 mg by mouth every morning.   calcium carbonate (TUMS - DOSED IN MG ELEMENTAL CALCIUM) 500 MG chewable tablet Chew 1 tablet by mouth every 8 (eight) hours as needed for indigestion or heartburn.   CALCIUM CARBONATE  ANTACID PO Take 1 tablet by mouth every 8 (eight) hours as needed.   carbidopa-levodopa (SINEMET CR) 50-200 MG tablet Take 1 tablet by mouth at bedtime.   Carboxymethylcellulose Sodium (ARTIFICIAL TEARS OP) Place 1 drop into both eyes every morning.   doxycycline (VIBRA-TABS) 100 MG tablet Take 1 tablet (100 mg total) by mouth 2 (two) times daily for 7 days.   ferrous sulfate 325 (65 FE) MG tablet Take 325 mg by mouth 2 (two) times a week. Monday & Thursday   GUAIFENESIN PO Take 10 mLs by mouth every 6 (six) hours as needed.   ipratropium-albuterol (DUONEB) 0.5-2.5 (3) MG/3ML SOLN Take 3 mLs by nebulization 3 (three) times daily as needed.   Multiple Vitamins-Minerals (PRESERVISION AREDS 2 PO) Take 1 capsule by mouth in the morning and at bedtime.   nystatin (MYCOSTATIN/NYSTOP) powder Apply 1 Application topically every 12 (twelve) hours as needed.   omeprazole (PRILOSEC) 20 MG capsule Take 1 capsule (20 mg total) by mouth daily before breakfast.   polyethylene glycol (MIRALAX / GLYCOLAX) 17 g packet Take 17 g by mouth daily.   senna (SENOKOT) 8.6 MG tablet Take 1 tablet by mouth every other day.   torsemide (DEMADEX) 20 MG tablet Take 20 mg by mouth 3 (three) times a week.   vitamin B-12 (CYANOCOBALAMIN) 1000 MCG tablet Take 1,000 mcg by mouth daily.   Vitamin D, Cholecalciferol, 25 MCG (1000 UT) TABS Take 1,000 Units by mouth daily.   White Petrolatum-Mineral Oil (ARTIFICIAL TEARS) 83-15 % OINT Place 1 Application into both eyes at bedtime.   zinc oxide 20 % ointment Apply 1 application. topically See admin instructions. To buttocks after every incontinent episode and as needed for redness.   Facility-Administered Encounter Medications as of 06/29/2023  Medication   carbidopa-levodopa (SINEMET IR) 25-100 MG per tablet immediate release 1 tablet    Review of Systems  Constitutional:  Negative for fatigue and fever.  HENT:  Positive for congestion. Negative for sore throat and trouble  swallowing.   Eyes:  Negative for visual disturbance.  Respiratory:  Positive for cough and wheezing. Negative for shortness of breath.   Cardiovascular:  Negative for chest pain and leg swelling.  Gastrointestinal:  Negative for abdominal distention and abdominal pain.  Genitourinary:  Negative for dysuria.  Musculoskeletal:  Positive for gait problem. Negative for myalgias.  Skin:  Negative for wound.  Neurological:  Positive for tremors and weakness. Negative for dizziness and headaches.  Psychiatric/Behavioral:  Positive for confusion. Negative for dysphoric mood and sleep disturbance. The patient is not nervous/anxious.     Immunization History  Administered Date(s) Administered   Fluad Quad(high Dose 65+) 01/29/2022   H1N1 06/13/2008   Influenza Split 03/05/2006, 02/26/2007, 01/26/2008, 01/18/2009, 12/12/2009, 01/16/2011, 01/29/2012, 02/01/2013, 02/08/2014, 02/13/2015, 01/10/2016, 02/23/2017, 02/24/2018, 01/24/2019, 12/22/2019   Influenza, High Dose Seasonal PF 01/30/2021, 02/04/2023   Moderna Covid-19 Fall Seasonal Vaccine 61yrs & older 02/11/2023  Moderna SARS-COV2 Booster Vaccination 09/18/2021   PFIZER(Purple Top)SARS-COV-2 Vaccination 05/04/2019, 05/25/2019, 09/01/2020, 02/11/2022   Pfizer Covid-19 Vaccine Bivalent Booster 77yrs & up 12/27/2019, 12/26/2020   Pfizer(Comirnaty)Fall Seasonal Vaccine 12 years and older 02/11/2022   Pneumococcal Conjugate-13 02/10/2014   Pneumococcal Polysaccharide-23 06/10/2011   Tdap 06/05/2022   Zoster Recombinant(Shingrix) 07/04/2009, 03/24/2022   Pertinent  Health Maintenance Due  Topic Date Due   INFLUENZA VACCINE  Completed      10/30/2022    2:50 PM 12/01/2022    9:07 AM 02/23/2023    2:34 PM 03/02/2023    3:44 PM 03/12/2023   12:56 PM  Fall Risk  Falls in the past year? 0 0  0 0  Was there an injury with Fall? 0 0 1 0 0  Fall Risk Category Calculator 0 0  0 0  Patient at Risk for Falls Due to No Fall Risks History of  fall(s);Impaired balance/gait;Impaired mobility History of fall(s);Impaired balance/gait;Impaired mobility History of fall(s);Impaired balance/gait;Impaired mobility   Fall risk Follow up Falls evaluation completed Falls evaluation completed;Education provided;Falls prevention discussed Falls evaluation completed;Education provided;Falls prevention discussed Falls evaluation completed;Education provided;Falls prevention discussed Falls evaluation completed   Functional Status Survey:    Vitals:   06/29/23 1006  BP: 125/79  Pulse: 80  Resp: 17  Temp: (!) 97.4 F (36.3 C)  SpO2: 98%  Weight: 177 lb 6.4 oz (80.5 kg)  Height: 5\' 10"  (1.778 m)   Body mass index is 25.45 kg/m. Physical Exam Vitals reviewed.  Constitutional:      General: He is not in acute distress. HENT:     Head: Normocephalic.  Eyes:     General:        Right eye: No discharge.        Left eye: No discharge.  Cardiovascular:     Rate and Rhythm: Normal rate and regular rhythm.     Pulses: Normal pulses.     Heart sounds: Murmur heard.  Pulmonary:     Effort: Pulmonary effort is normal. No respiratory distress.     Breath sounds: Normal breath sounds. No wheezing, rhonchi or rales.  Abdominal:     General: Bowel sounds are normal.     Palpations: Abdomen is soft.  Musculoskeletal:     Cervical back: Neck supple.     Right lower leg: No edema.     Comments: LBKA  Skin:    General: Skin is warm.     Capillary Refill: Capillary refill takes less than 2 seconds.  Neurological:     General: No focal deficit present.     Mental Status: He is alert. Mental status is at baseline.     Motor: Weakness present.     Gait: Gait abnormal.  Psychiatric:        Mood and Affect: Mood normal.     Labs reviewed: Recent Labs    09/05/22 0000 01/15/23 0000  NA 138 135*  K 4.3 4.3  CL 103 99  CO2 28* 28*  BUN 34* 25*  CREATININE 1.0 1.0  CALCIUM 8.9 8.6*   Recent Labs    09/05/22 0000  AST 13*  ALT 7*   ALKPHOS 73  ALBUMIN 3.8   Recent Labs    09/05/22 0000  WBC 6.0  HGB 13.4*  HCT 40*  PLT 176   Lab Results  Component Value Date   TSH 3.07 09/05/2022   Lab Results  Component Value Date   HGBA1C 6.0 09/05/2022   No results  found for: "CHOL", "HDL", "LDLCALC", "LDLDIRECT", "TRIG", "CHOLHDL"  Significant Diagnostic Results in last 30 days:  No results found.  Assessment/Plan 1. Acute cough (Primary) - 03/22 increased cough, wheezing and congestion - covid negative - CXR noted mild CHF versus interstitial PNA - doxycycline x 7 days started - cont duonebs prn - lung sounds clear today - reports improved symptoms     Family/ staff Communication: plan discussed with patient and nurse  Labs/tests ordered:  none

## 2023-07-23 ENCOUNTER — Non-Acute Institutional Stay (SKILLED_NURSING_FACILITY): Payer: Self-pay | Admitting: Internal Medicine

## 2023-07-23 ENCOUNTER — Encounter: Payer: Self-pay | Admitting: Internal Medicine

## 2023-07-23 DIAGNOSIS — Z89512 Acquired absence of left leg below knee: Secondary | ICD-10-CM | POA: Diagnosis not present

## 2023-07-23 DIAGNOSIS — R600 Localized edema: Secondary | ICD-10-CM

## 2023-07-23 DIAGNOSIS — F32A Depression, unspecified: Secondary | ICD-10-CM

## 2023-07-23 DIAGNOSIS — D508 Other iron deficiency anemias: Secondary | ICD-10-CM

## 2023-07-23 DIAGNOSIS — N1832 Chronic kidney disease, stage 3b: Secondary | ICD-10-CM | POA: Diagnosis not present

## 2023-07-23 DIAGNOSIS — G20A2 Parkinson's disease without dyskinesia, with fluctuations: Secondary | ICD-10-CM | POA: Diagnosis not present

## 2023-07-23 NOTE — Progress Notes (Signed)
 [facility]  [facility] of Service:  SNF ([id number])  Provider:   Code Status: DNR/DNI Goals of Care:     05/29/2023    3:50 PM  Advanced Directives  Does Patient Have a Medical Advance Directive? Yes  Type of Advance Directive Living will  Does patient want to make changes to medical advance directive? No - Patient declined     Chief Complaint  Patient presents with   Care Management    HPI: Patient is a 89 y.o. male seen today for medical management of chronic diseases.   Lives in SNF   Patient is s/p Left BKA for Left Osteomyelitis  h/o Parkinson disease Follows with Dr [name] Diagnosed in 2019 in [place]    h/o Osteoporosis , Vit D def Has been on Fosamax before, BPH, Arthritis,   right femur fracture followed by acute DVT of right leg.     He is stable. No new Nursing issues.  Has Lost Weight  Walks with his walker with Mod Assit 2/week with Therapy No Falls Wt Readings from Last 3 Encounters:  07/23/23 170 lb (77.1 kg)  06/29/23 177 lb 6.4 oz (80.5 kg)  06/22/23 175 lb 1.6 oz (79.4 kg)    Past Medical History:  Diagnosis Date   Acute bronchiolitis    Acute osteomyelitis of calcaneum, left (HCC) 01/11/2021   Anemia    Bursitis of both hips    Complication of anesthesia    wife states pt was a little "loopy" for several days   Dementia (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   Hearing reduced    hearing aid   History of basal cell carcinoma    Hyperlipidemia    Malaise and fatigue    MRSA infection 01/11/2021   Osteoarthritis    hand   Osteoporosis    Palpitation    Parkinson's disease (HCC)    Peripheral vascular disease (HCC)    blood clots   Pre-diabetes    Vitamin D  deficiency     Past Surgical History:  Procedure Laterality Date   ABDOMINAL AORTOGRAM W/LOWER EXTREMITY N/A 06/12/2021   Procedure: ABDOMINAL AORTOGRAM W/LOWER EXTREMITY;  Surgeon: Wenona Hamilton, MD;  Location: [facility];  Service: Cardiovascular;  Laterality: N/A;    AMPUTATION Left 07/03/2021   Procedure: LEFT BELOW KNEE AMPUTATION;  Surgeon: Timothy Ford, MD;  Location: [facility] OR;  Service: Orthopedics;  Laterality: Left;   CATARACT EXTRACTION  05/31/2015   Eye/right   COLONOSCOPY  02/05/2005, 03/06/2005   normal, 10 years ago   MOHS micrographic surgery     Face surgery   ORIF ACETABULAR FRACTURE Right 10/09/2020   Procedure: OPEN REDUCTION INTERNAL FIXATION (ORIF) ACETABULAR FRACTURE;  Surgeon: Hardy Lia, MD;  Location: [facility] OR;  Service: Orthopedics;  Laterality: Right;    No Known Allergies  Outpatient Encounter Medications as of 07/23/2023  Medication Sig   acetaminophen  (TYLENOL ) 500 MG tablet Take 1,000 mg by mouth as needed.   acetaminophen  (TYLENOL ) 500 MG tablet Take 1,000 mg by mouth every morning.   albuterol  (VENTOLIN  HFA) 108 (90 Base) MCG/ACT inhaler Inhale 2 puffs into the lungs every 4 (four) hours as needed for wheezing or shortness of breath.   Ascorbic Acid  (VITAMIN C) 1000 MG tablet Take 1,000 mg by mouth daily.   calcium  carbonate (CALCIUM  600) 600 MG TABS tablet Take 600 mg by mouth every morning.   calcium  carbonate (TUMS - DOSED IN MG ELEMENTAL CALCIUM ) 500 MG  chewable tablet Chew 1 tablet by mouth every 8 (eight) hours as needed for indigestion or heartburn.   CALCIUM  CARBONATE ANTACID PO Take 1 tablet by mouth every 8 (eight) hours as needed.   carbidopa -levodopa  (SINEMET  CR) 50-200 MG tablet Take 1 tablet by mouth at bedtime.   Carboxymethylcellulose Sodium (ARTIFICIAL TEARS OP) Place 1 drop into both eyes every morning.   ferrous sulfate 325 (65 FE) MG tablet Take 325 mg by mouth 2 (two) times a week. Monday & Thursday   GUAIFENESIN  PO Take 10 mLs by mouth every 6 (six) hours as needed.   ipratropium-albuterol  (DUONEB) 0.5-2.5 (3) MG/3ML SOLN Take 3 mLs by nebulization 3 (three) times daily as needed.   Multiple Vitamins-Minerals (PRESERVISION AREDS 2 PO) Take 1 capsule by mouth in the morning and at bedtime.   nystatin   (MYCOSTATIN /NYSTOP ) powder Apply 1 Application topically every 12 (twelve) hours as needed.   omeprazole  (PRILOSEC) 20 MG capsule Take 1 capsule (20 mg total) by mouth daily before breakfast.   polyethylene glycol (MIRALAX  / GLYCOLAX ) 17 g packet Take 17 g by mouth daily.   senna (SENOKOT) 8.6 MG tablet Take 1 tablet by mouth every other day.   torsemide  (DEMADEX ) 20 MG tablet Take 20 mg by mouth 3 (three) times a week.   vitamin B-12 (CYANOCOBALAMIN) 1000 MCG tablet Take 1,000 mcg by mouth daily.   Vitamin D , Cholecalciferol , 25 MCG (1000 UT) TABS Take 1,000 Units by mouth daily.   White Petrolatum-Mineral Oil (ARTIFICIAL TEARS) 83-15 % OINT Place 1 Application into both eyes at bedtime.   zinc  oxide 20 % ointment Apply 1 application. topically See admin instructions. To buttocks after every incontinent episode and as needed for redness.   [DISCONTINUED] carbidopa -levodopa  (SINEMET  IR) 25-100 MG per tablet immediate release 1 tablet    No facility-administered encounter medications on file as of 07/23/2023.    Review of Systems:  Review of Systems  Constitutional:  Negative for activity change, appetite change and unexpected weight change.  HENT: Negative.    Respiratory:  Negative for cough and shortness of breath.   Cardiovascular:  Negative for leg swelling.  Gastrointestinal:  Negative for constipation.  Genitourinary:  Negative for frequency.  Musculoskeletal:  Positive for gait problem. Negative for arthralgias and myalgias.  Skin: Negative.  Negative for rash.  Neurological:  Negative for dizziness and weakness.  Psychiatric/Behavioral:  Negative for confusion and sleep disturbance.   All other systems reviewed and are negative.   Health Maintenance  Topic Date Due   Medicare Annual Wellness (AWV)  06/05/2023   COVID-19 Vaccine [phone] - 2024-25 season) 08/11/2023   INFLUENZA VACCINE  11/06/2023   DTaP/Tdap/Td (2 - Td or Tdap) 06/04/2032   Pneumonia Vaccine 32+ Years old   Completed   Zoster Vaccines- Shingrix  Completed   HPV VACCINES  Aged Out   Meningococcal B Vaccine  Aged Out    Physical Exam: Vitals:   07/23/23 1338  BP: 139/73  Pulse: 72  Temp: 98.4 F (36.9 C)  Weight: 170 lb (77.1 kg)   Body mass index is 24.39 kg/m. Physical Exam Vitals reviewed.  Constitutional:      Appearance: Normal appearance.  HENT:     Head: Normocephalic.     Nose: Nose normal.     Mouth/Throat:     Mouth: Mucous membranes are moist.     Pharynx: Oropharynx is clear.  Eyes:     Pupils: Pupils are equal, round, and reactive to light.  Cardiovascular:  Rate and Rhythm: Normal rate and regular rhythm.     Pulses: Normal pulses.     Heart sounds: No murmur heard. Pulmonary:     Effort: Pulmonary effort is normal. No respiratory distress.     Breath sounds: Normal breath sounds. No rales.  Abdominal:     General: Abdomen is flat. Bowel sounds are normal.     Palpations: Abdomen is soft.  Musculoskeletal:        General: No swelling.     Cervical back: Neck supple.     Comments: Mild Right Leg Swelling  Skin:    General: Skin is warm.  Neurological:     General: No focal deficit present.     Mental Status: He is alert.  Psychiatric:        Mood and Affect: Mood normal.        Thought Content: Thought content normal.    Labs reviewed: Basic Metabolic Panel: Recent Labs    09/05/22 0000 01/15/23 0000  NA 138 135*  K 4.3 4.3  CL 103 99  CO2 28* 28*  BUN 34* 25*  CREATININE 1.0 1.0  CALCIUM  8.9 8.6*  TSH 3.07  --    Liver Function Tests: Recent Labs    09/05/22 0000  AST 13*  ALT 7*  ALKPHOS 73  ALBUMIN  3.8   No results for input(s): "LIPASE", "AMYLASE" in the last 8760 hours. No results for input(s): "AMMONIA" in the last 8760 hours. CBC: Recent Labs    09/05/22 0000  WBC 6.0  HGB 13.4*  HCT 40*  PLT 176   Lipid Panel: No results for input(s): "CHOL", "HDL", "LDLCALC", "TRIG", "CHOLHDL", "LDLDIRECT" in the last 8760  hours. Lab Results  Component Value Date   HGBA1C 6.0 09/05/2022    Procedures since last visit: No results found.  Assessment/Plan 1. Parkinson's disease with fluctuating manifestations, unspecified whether dyskinesia present (HCC) (Primary) On Sinemet  Follows with Neurology Nurses have noticed Decline  2. S/P below knee amputation, left (HCC) Has prosthetics But mostly stays in wheelchair Also Walking with therapy  3. Stage 3b chronic kidney disease (HCC) Creat stable  4. Iron deficiency anemia secondary to inadequate dietary iron intake HGB good on low dose iron  5. Edema of right lower leg Torsemide    6. Depression, unspecified depression type No Meds right now  7 Weight loss Recent Labs were all normal Talked to Staff and they think he has declined Also Reduced his Meal portion Will d/w wife next visit  Labs/tests ordered:  * No order type specified * Next appt:  Visit date not found

## 2023-07-31 ENCOUNTER — Encounter: Payer: Self-pay | Admitting: Orthopedic Surgery

## 2023-07-31 ENCOUNTER — Non-Acute Institutional Stay (SKILLED_NURSING_FACILITY): Payer: Self-pay | Admitting: Orthopedic Surgery

## 2023-07-31 DIAGNOSIS — R051 Acute cough: Secondary | ICD-10-CM | POA: Diagnosis not present

## 2023-07-31 DIAGNOSIS — R600 Localized edema: Secondary | ICD-10-CM

## 2023-07-31 DIAGNOSIS — Z89512 Acquired absence of left leg below knee: Secondary | ICD-10-CM

## 2023-07-31 DIAGNOSIS — G20A2 Parkinson's disease without dyskinesia, with fluctuations: Secondary | ICD-10-CM | POA: Diagnosis not present

## 2023-07-31 MED ORDER — AMOXICILLIN-POT CLAVULANATE 875-125 MG PO TABS: 1.0000 | ORAL_TABLET | Freq: Two times a day (BID) | ORAL | Status: AC

## 2023-07-31 NOTE — Progress Notes (Signed)
 Location:  [facility] Room Number: 27/A Place of Service:  SNF [phone]) Provider:  Arnetha Bhat, NP   Marguerite Shiley, MD  Patient Care Team: Marguerite Shiley, MD as PCP - General (Internal Medicine) [name], Von Grumbling, DO as Consulting Physician (Neurology)  Extended Emergency Contact Information Primary Emergency Contact: Moorehead,jane Address: [address] [place].          apt [phone] [place] Home Phone: [phone] Mobile Phone: [phone] Relation: Spouse Secondary Emergency Contact: Harbaugh,kenneth Home Phone: [phone] Mobile Phone: [phone] Relation: Son  Code Status:  DNR Goals of care: Advanced Directive information    05/29/2023    3:50 PM  Advanced Directives  Does Patient Have a Medical Advance Directive? Yes  Type of Advance Directive Living will  Does patient want to make changes to medical advance directive? No - Patient declined     Chief Complaint  Patient presents with   Acute Visit    Acute cough, choking episode    HPI:  Pt is a 89 y.o. male seen today for acute visit due to cough and choking episode 04/24.   He currently resides on the skilled nursing unit at [facility]. PMH: HTN, HLD, HFpEF, parkinson's, PBA, acute DVT right lower leg 12/27/2020, osteoporosis (past Fosamax use), BPH, CKD, ORIF right acetabular 10/2020, PNA LLL 12/29/2022, and unstable gait.   "03/22 he had increased wheezing, congestion and cough. Covid test negative. On call provider ordered CXR. CXR revealed mild CHF versus interstitial pneumonia. He was started on doxycycline  x 7 days and given extra dose of torsemide . Today, wife and patient confirm improved cough and symptoms. He appears more alert from weekend. Appetite improved. Afebrile. Vitals stable."  His symptoms had improved for about 1 month. Last night he had a coughing episode in the evening with concerns for aspiration. Today, he appears very tired. He feels  warm. He stay awake to tell me how he feels. No recent ST evaluation. He is needing more help with ADLs including feeding. He is a DNR. MOST form with no hospitalizations. Afebrile. Vitals stable.   No recent falls or injuries. Mainly ambulates in wheelchair. 2 person assist with ambulation.   Past Medical History:  Diagnosis Date   Acute bronchiolitis    Acute osteomyelitis of calcaneum, left (HCC) 01/11/2021   Anemia    Bursitis of both hips    Complication of anesthesia    wife states pt was a little "loopy" for several days   Dementia (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   Hearing reduced    hearing aid   History of basal cell carcinoma    Hyperlipidemia    Malaise and fatigue    MRSA infection 01/11/2021   Osteoarthritis    hand   Osteoporosis    Palpitation    Parkinson's disease (HCC)    Peripheral vascular disease (HCC)    blood clots   Pre-diabetes    Vitamin D  deficiency    Past Surgical History:  Procedure Laterality Date   ABDOMINAL AORTOGRAM W/LOWER EXTREMITY N/A 06/12/2021   Procedure: ABDOMINAL AORTOGRAM W/LOWER EXTREMITY;  Surgeon: Wenona Hamilton, MD;  Location: [facility];  Service: Cardiovascular;  Laterality: N/A;   AMPUTATION Left 07/03/2021   Procedure: LEFT BELOW KNEE AMPUTATION;  Surgeon: Timothy Ford, MD;  Location: [facility] OR;  Service: Orthopedics;  Laterality: Left;   CATARACT EXTRACTION  05/31/2015   Eye/right   COLONOSCOPY  02/05/2005, 03/06/2005   normal, 10 years ago   MOHS micrographic surgery     Face surgery   ORIF ACETABULAR FRACTURE Right 10/09/2020   Procedure: OPEN REDUCTION INTERNAL FIXATION (ORIF) ACETABULAR FRACTURE;  Surgeon: Hardy Lia, MD;  Location: [facility] OR;  Service: Orthopedics;  Laterality: Right;    No Known Allergies  Outpatient Encounter Medications as of 07/31/2023  Medication Sig   acetaminophen  (TYLENOL ) 500 MG tablet Take 1,000 mg by mouth as needed.   acetaminophen  (TYLENOL ) 500 MG tablet Take 1,000 mg by mouth every  morning.   albuterol  (VENTOLIN  HFA) 108 (90 Base) MCG/ACT inhaler Inhale 2 puffs into the lungs every 4 (four) hours as needed for wheezing or shortness of breath.   Ascorbic Acid  (VITAMIN C) 1000 MG tablet Take 1,000 mg by mouth daily.   calcium  carbonate (CALCIUM  600) 600 MG TABS tablet Take 600 mg by mouth every morning.   calcium  carbonate (TUMS - DOSED IN MG ELEMENTAL CALCIUM ) 500 MG chewable tablet Chew 1 tablet by mouth every 8 (eight) hours as needed for indigestion or heartburn.   CALCIUM  CARBONATE ANTACID PO Take 1 tablet by mouth every 8 (eight) hours as needed.   carbidopa -levodopa  (SINEMET  CR) 50-200 MG tablet Take 1 tablet by mouth at bedtime.   carbidopa -levodopa  (SINEMET  IR) 25-100 MG tablet Take 1 tablet by mouth 4 (four) times daily.   Carboxymethylcellulose Sodium (ARTIFICIAL TEARS OP) Place 1 drop into both eyes every morning.   ferrous sulfate 325 (65 FE) MG tablet Take 325 mg by mouth 2 (two) times a week. Monday & Thursday   GUAIFENESIN  PO Take 10 mLs by mouth every 6 (six) hours as needed.   ipratropium-albuterol  (DUONEB) 0.5-2.5 (3) MG/3ML SOLN Take 3 mLs by nebulization 3 (three) times daily as needed.   Multiple Vitamins-Minerals (PRESERVISION AREDS 2 PO) Take 1 capsule by mouth in the morning and at bedtime.   nystatin  (MYCOSTATIN /NYSTOP ) powder Apply 1 Application topically every 12 (twelve) hours as needed.   omeprazole  (PRILOSEC) 20 MG capsule Take 1 capsule (20 mg total) by mouth daily before breakfast.   polyethylene glycol (MIRALAX  / GLYCOLAX ) 17 g packet Take 17 g by mouth daily.   senna (SENOKOT) 8.6 MG tablet Take 1 tablet by mouth every other day.   torsemide  (DEMADEX ) 20 MG tablet Take 20 mg by mouth 3 (three) times a week.   vitamin B-12 (CYANOCOBALAMIN) 1000 MCG tablet Take 1,000 mcg by mouth daily.   Vitamin D , Cholecalciferol , 25 MCG (1000 UT) TABS Take 1,000 Units by mouth daily.   White Petrolatum-Mineral Oil (ARTIFICIAL TEARS) 83-15 % OINT Place 1  Application into both eyes at bedtime.   zinc  oxide 20 % ointment Apply 1 application. topically See admin instructions. To buttocks after every incontinent episode and as needed for redness.   No facility-administered encounter medications on file as of 07/31/2023.    Review of Systems  Unable to perform ROS: Patient nonverbal    Immunization History  Administered Date(s) Administered   Fluad Quad(high Dose 65+) 01/29/2022   H1N1 06/13/2008   Influenza Split 03/05/2006, 02/26/2007, 01/26/2008, 01/18/2009, 12/12/2009, 01/16/2011, 01/29/2012, 02/01/2013, 02/08/2014, 02/13/2015, 01/10/2016, 02/23/2017, 02/24/2018, 01/24/2019, 12/22/2019   Influenza, High Dose Seasonal PF 01/30/2021, 02/04/2023   Moderna Covid-19 Fall Seasonal Vaccine 79yrs & older 02/11/2023   Moderna SARS-COV2 Booster Vaccination 09/18/2021   PFIZER(Purple Top)SARS-COV-2 Vaccination 05/04/2019, 05/25/2019, 09/01/2020, 02/11/2022   Pfizer Covid-19 Vaccine Bivalent Booster 66yrs & up 12/27/2019, 12/26/2020   Pfizer(Comirnaty)Fall Seasonal Vaccine 12 years and older  02/11/2022   Pneumococcal Conjugate-13 02/10/2014   Pneumococcal Polysaccharide-23 06/10/2011   Tdap 06/05/2022   Zoster Recombinant(Shingrix) 07/04/2009, 03/24/2022   Pertinent  Health Maintenance Due  Topic Date Due   INFLUENZA VACCINE  11/06/2023      10/30/2022    2:50 PM 12/01/2022    9:07 AM 02/23/2023    2:34 PM 03/02/2023    3:44 PM 03/12/2023   12:56 PM  Fall Risk  Falls in the past year? 0 0  0 0  Was there an injury with Fall? 0 0 1 0 0  Fall Risk Category Calculator 0 0  0 0  Patient at Risk for Falls Due to No Fall Risks History of fall(s);Impaired balance/gait;Impaired mobility History of fall(s);Impaired balance/gait;Impaired mobility History of fall(s);Impaired balance/gait;Impaired mobility   Fall risk Follow up Falls evaluation completed Falls evaluation completed;Education provided;Falls prevention discussed Falls evaluation  completed;Education provided;Falls prevention discussed Falls evaluation completed;Education provided;Falls prevention discussed Falls evaluation completed   Functional Status Survey:    Vitals:   07/31/23 1143  BP: (!) 103/57  Pulse: 81  Resp: 16  Temp: (!) 97.5 F (36.4 C)  SpO2: 96%  Weight: 170 lb 1.6 oz (77.2 kg)  Height: 5\' 10"  (1.778 m)   Body mass index is 24.41 kg/m. Physical Exam Vitals reviewed.  HENT:     Nose: Nose normal.     Mouth/Throat:     Mouth: Mucous membranes are moist.  Eyes:     General:        Right eye: No discharge.        Left eye: No discharge.  Cardiovascular:     Rate and Rhythm: Normal rate and regular rhythm.     Pulses: Normal pulses.     Heart sounds: Normal heart sounds.  Pulmonary:     Effort: Pulmonary effort is normal.     Breath sounds: Decreased air movement present. Examination of the right-upper field reveals decreased breath sounds. Examination of the left-upper field reveals decreased breath sounds. Examination of the right-middle field reveals decreased breath sounds. Examination of the left-middle field reveals decreased breath sounds. Decreased breath sounds present.  Abdominal:     General: Bowel sounds are normal.     Palpations: Abdomen is soft.  Musculoskeletal:     Cervical back: Neck supple.     Right lower leg: No edema.     Comments: Left BKA  Skin:    General: Skin is warm.     Capillary Refill: Capillary refill takes less than 2 seconds.  Neurological:     General: No focal deficit present.     Mental Status: He is easily aroused. Mental status is at baseline.     Motor: Weakness present.     Gait: Gait abnormal.  Psychiatric:        Mood and Affect: Mood normal.     Labs reviewed: Recent Labs    09/05/22 0000 01/15/23 0000  NA 138 135*  K 4.3 4.3  CL 103 99  CO2 28* 28*  BUN 34* 25*  CREATININE 1.0 1.0  CALCIUM  8.9 8.6*   Recent Labs    09/05/22 0000  AST 13*  ALT 7*  ALKPHOS 73   ALBUMIN  3.8   Recent Labs    09/05/22 0000  WBC 6.0  HGB 13.4*  HCT 40*  PLT 176   Lab Results  Component Value Date   TSH 3.07 09/05/2022   Lab Results  Component Value Date   HGBA1C 6.0 09/05/2022  No results found for: "CHOL", "HDL", "LDLCALC", "LDLDIRECT", "TRIG", "CHOLHDL"  Significant Diagnostic Results in last 30 days:  No results found.  Assessment/Plan 1. Acute cough (Primary) - increased coughing last night> nursing concerned aspiration - drowsy today, feel warm no fever, pale - upper lobes mildly diminished - suspected URI last month> resolved with doxycycline  x 7 days - CXR - start Augmentin 875/125 mg po BID x 7 days for suspect aspiration PNA - ST evaluation - cbc/diff, bmp 04/28  2. Parkinson's disease with fluctuating manifestations, unspecified whether dyskinesia present (HCC) - see above - overall decline in health x 2 months - sleeping more, needing more assistance with meals - followed by neurology - cont sinemet   3. Lower extremity edema - cont torsemide   4. S/P below knee amputation, left (HCC) - cont skilled nursing     Family/ staff Communication: plan discussed with patient and nurse  Labs/tests ordered:  CXR, ST evaluation, cbc/diff & bmp 04/28

## 2023-08-03 ENCOUNTER — Encounter: Payer: Self-pay | Admitting: Orthopedic Surgery

## 2023-08-03 ENCOUNTER — Non-Acute Institutional Stay (SKILLED_NURSING_FACILITY): Payer: Self-pay | Admitting: Orthopedic Surgery

## 2023-08-03 DIAGNOSIS — G20A2 Parkinson's disease without dyskinesia, with fluctuations: Secondary | ICD-10-CM | POA: Diagnosis not present

## 2023-08-03 DIAGNOSIS — R051 Acute cough: Secondary | ICD-10-CM | POA: Diagnosis not present

## 2023-08-03 LAB — CBC AND DIFFERENTIAL
HCT: 32 — AB (ref 41–53)
Hemoglobin: 9.9 — AB (ref 13.5–17.5)
Neutrophils Absolute: 5378
Platelets: 350 10*3/uL (ref 150–400)
WBC: 7.5

## 2023-08-03 LAB — BASIC METABOLIC PANEL WITH GFR
BUN: 19 (ref 4–21)
CO2: 25 — AB (ref 13–22)
Chloride: 98 — AB (ref 99–108)
Creatinine: 0.8 (ref 0.6–1.3)
Glucose: 78
Potassium: 4.6 meq/L (ref 3.5–5.1)
Sodium: 136 — AB (ref 137–147)

## 2023-08-03 LAB — COMPREHENSIVE METABOLIC PANEL WITH GFR
Calcium: 8.4 — AB (ref 8.7–10.7)
eGFR: 85

## 2023-08-03 LAB — CBC: RBC: 3.71 — AB (ref 3.87–5.11)

## 2023-08-03 NOTE — Progress Notes (Signed)
 Location:  [facility] Room Number: 27/A Place of Service:  SNF [phone]) Provider:  Arnetha Bhat, NP   Marguerite Shiley, MD  Patient Care Team: Marguerite Shiley, MD as PCP - General (Internal Medicine) [name], Von Grumbling, DO as Consulting Physician (Neurology)  Extended Emergency Contact Information Primary Emergency Contact: Bagent,jane Address: [address] [place].          apt [phone] [place] Home Phone: [phone] Mobile Phone: [phone] Relation: Spouse Secondary Emergency Contact: Hamon,kenneth Home Phone: [phone] Mobile Phone: [phone] Relation: Son  Code Status:  DNR Goals of care: Advanced Directive information    05/29/2023    3:50 PM  Advanced Directives  Does Patient Have a Medical Advance Directive? Yes  Type of Advance Directive Living will  Does patient want to make changes to medical advance directive? No - Patient declined     Chief Complaint  Patient presents with   Acute Visit    Acute cough    HPI:  Pt is a 89 y.o. male seen today for acute visit due to ongoing cough.   He currently resides on the skilled nursing unit at [facility]. PMH: HTN, HLD, HFpEF, parkinson's, PBA, acute DVT right lower leg 12/27/2020, osteoporosis (past Fosamax use), BPH, CKD, ORIF right acetabular 10/2020, PNA LLL 12/29/2022, and unstable gait.   Ongoing acute cough with coughing spells around meal time. 04/25 CXR negative for consolidation or effusion, chronic lung changes present. He was started on Augmentin for suspected aspiration pneumonia. Today, he more alert during our encounter. He is able to answer simple questions. Wife reports cough has not subsided. He was unable to eat dinner yesterday and could only tolerate Boost drink. 04/25 ST evaluation ordered, they plan to do evaluation today. Afebrile. Vitals stable.   Nursing reports worsening physical ability. Now needing help with meal assistance. Also  sleeping more during the day. Followed by Dr. Winferd Hatter for parkinson's. Remains on sinemet .    Past Medical History:  Diagnosis Date   Acute bronchiolitis    Acute osteomyelitis of calcaneum, left (HCC) 01/11/2021   Anemia    Bursitis of both hips    Complication of anesthesia    wife states pt was a little "loopy" for several days   Dementia (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   Hearing reduced    hearing aid   History of basal cell carcinoma    Hyperlipidemia    Malaise and fatigue    MRSA infection 01/11/2021   Osteoarthritis    hand   Osteoporosis    Palpitation    Parkinson's disease (HCC)    Peripheral vascular disease (HCC)    blood clots   Pre-diabetes    Vitamin D  deficiency    Past Surgical History:  Procedure Laterality Date   ABDOMINAL AORTOGRAM W/LOWER EXTREMITY N/A 06/12/2021   Procedure: ABDOMINAL AORTOGRAM W/LOWER EXTREMITY;  Surgeon: Wenona Hamilton, MD;  [facility]: [facility] INV[facility];  Service: Cardiovascular;  Laterality: N/A;   AMPUTATION Left 07/03/2021   Procedure: LEFT BELOW KNEE AMPUTATION;  Surgeon: Timothy Ford, MD;  Location: [facility] OR;  Service: Orthopedics;  Laterality: Left;   CATARACT EXTRACTION  05/31/2015   Eye/right   COLONOSCOPY  02/05/2005, 03/06/2005   normal, 10 years ago   MOHS micrographic surgery     Face surgery   ORIF ACETABULAR FRACTURE Right 10/09/2020   Procedure: OPEN REDUCTION INTERNAL FIXATION (ORIF) ACETABULAR FRACTURE;  Surgeon: Guyann Leitz,  Bambi Lever, MD;  Location: [facility] OR;  Service: Orthopedics;  Laterality: Right;    No Known Allergies  Outpatient Encounter Medications as of 08/03/2023  Medication Sig   acetaminophen  (TYLENOL ) 500 MG tablet Take 1,000 mg by mouth as needed.   acetaminophen  (TYLENOL ) 500 MG tablet Take 1,000 mg by mouth every morning.   albuterol  (VENTOLIN  HFA) 108 (90 Base) MCG/ACT inhaler Inhale 2 puffs into the lungs every 4 (four) hours as needed for wheezing or shortness of breath.   amoxicillin-clavulanate (AUGMENTIN)  875-125 MG tablet Take 1 tablet by mouth 2 (two) times daily for 7 days.   Ascorbic Acid  (VITAMIN C) 1000 MG tablet Take 1,000 mg by mouth daily.   calcium  carbonate (CALCIUM  600) 600 MG TABS tablet Take 600 mg by mouth every morning.   calcium  carbonate (TUMS - DOSED IN MG ELEMENTAL CALCIUM ) 500 MG chewable tablet Chew 1 tablet by mouth every 8 (eight) hours as needed for indigestion or heartburn.   CALCIUM  CARBONATE ANTACID PO Take 1 tablet by mouth every 8 (eight) hours as needed.   carbidopa -levodopa  (SINEMET  CR) 50-200 MG tablet Take 1 tablet by mouth at bedtime.   carbidopa -levodopa  (SINEMET  IR) 25-100 MG tablet Take 1 tablet by mouth 4 (four) times daily.   Carboxymethylcellulose Sodium (ARTIFICIAL TEARS OP) Place 1 drop into both eyes every morning.   ferrous sulfate 325 (65 FE) MG tablet Take 325 mg by mouth 2 (two) times a week. Monday & Thursday   GUAIFENESIN  PO Take 10 mLs by mouth every 6 (six) hours as needed.   ipratropium-albuterol  (DUONEB) 0.5-2.5 (3) MG/3ML SOLN Take 3 mLs by nebulization 3 (three) times daily as needed.   Multiple Vitamins-Minerals (PRESERVISION AREDS 2 PO) Take 1 capsule by mouth in the morning and at bedtime.   nystatin  (MYCOSTATIN /NYSTOP ) powder Apply 1 Application topically every 12 (twelve) hours as needed.   omeprazole  (PRILOSEC) 20 MG capsule Take 1 capsule (20 mg total) by mouth daily before breakfast.   polyethylene glycol (MIRALAX  / GLYCOLAX ) 17 g packet Take 17 g by mouth daily.   senna (SENOKOT) 8.6 MG tablet Take 1 tablet by mouth every other day.   torsemide  (DEMADEX ) 20 MG tablet Take 20 mg by mouth 3 (three) times a week.   vitamin B-12 (CYANOCOBALAMIN) 1000 MCG tablet Take 1,000 mcg by mouth daily.   Vitamin D , Cholecalciferol , 25 MCG (1000 UT) TABS Take 1,000 Units by mouth daily.   White Petrolatum-Mineral Oil (ARTIFICIAL TEARS) 83-15 % OINT Place 1 Application into both eyes at bedtime.   zinc  oxide 20 % ointment Apply 1 application.  topically See admin instructions. To buttocks after every incontinent episode and as needed for redness.   No facility-administered encounter medications on file as of 08/03/2023.    Review of Systems  Constitutional:  Negative for fatigue and fever.  HENT:  Positive for trouble swallowing. Negative for sore throat.   Respiratory:  Negative for cough and shortness of breath.   Cardiovascular:  Negative for chest pain and leg swelling.  Musculoskeletal:  Positive for gait problem.  Neurological:  Positive for tremors, speech difficulty and weakness.  Psychiatric/Behavioral:  Positive for confusion. Negative for dysphoric mood. The patient is not nervous/anxious.     Immunization History  Administered Date(s) Administered   Fluad Quad(high Dose 65+) 01/29/2022   H1N1 06/13/2008   Influenza Split 03/05/2006, 02/26/2007, 01/26/2008, 01/18/2009, 12/12/2009, 01/16/2011, 01/29/2012, 02/01/2013, 02/08/2014, 02/13/2015, 01/10/2016, 02/23/2017, 02/24/2018, 01/24/2019, 12/22/2019   Influenza, High Dose Seasonal PF 01/30/2021, 02/04/2023  Moderna Covid-19 Fall Seasonal Vaccine 73yrs & older 02/11/2023   Moderna SARS-COV2 Booster Vaccination 09/18/2021   PFIZER(Purple Top)SARS-COV-2 Vaccination 05/04/2019, 05/25/2019, 09/01/2020, 02/11/2022   Pfizer Covid-19 Vaccine Bivalent Booster 65yrs & up 12/27/2019, 12/26/2020   Pfizer(Comirnaty)Fall Seasonal Vaccine 12 years and older 02/11/2022   Pneumococcal Conjugate-13 02/10/2014   Pneumococcal Polysaccharide-23 06/10/2011   Tdap 06/05/2022   Zoster Recombinant(Shingrix) 07/04/2009, 03/24/2022   Pertinent  Health Maintenance Due  Topic Date Due   INFLUENZA VACCINE  11/06/2023      12/01/2022    9:07 AM 02/23/2023    2:34 PM 03/02/2023    3:44 PM 03/12/2023   12:56 PM 07/31/2023    1:33 PM  Fall Risk  Falls in the past year? 0  0 0 0  Was there an injury with Fall? 0 1 0 0 0  Fall Risk Category Calculator 0  0 0 0  Patient at Risk for Falls  Due to History of fall(s);Impaired balance/gait;Impaired mobility History of fall(s);Impaired balance/gait;Impaired mobility History of fall(s);Impaired balance/gait;Impaired mobility  History of fall(s);Impaired balance/gait;Impaired mobility  Fall risk Follow up Falls evaluation completed;Education provided;Falls prevention discussed Falls evaluation completed;Education provided;Falls prevention discussed Falls evaluation completed;Education provided;Falls prevention discussed Falls evaluation completed Falls evaluation completed;Education provided   Functional Status Survey:    Vitals:   08/03/23 1116  BP: (!) 103/57  Pulse: 81  Resp: 16  Temp: (!) 97.5 F (36.4 C)  SpO2: 96%  Weight: 170 lb 1.6 oz (77.2 kg)  Height: 5\' 10"  (1.778 m)   Body mass index is 24.41 kg/m. Physical Exam Vitals reviewed.  Constitutional:      General: He is not in acute distress. HENT:     Head: Normocephalic.  Eyes:     General:        Right eye: No discharge.        Left eye: No discharge.  Cardiovascular:     Rate and Rhythm: Normal rate and regular rhythm.     Pulses: Normal pulses.     Heart sounds: Murmur heard.  Pulmonary:     Effort: Pulmonary effort is normal. No respiratory distress.     Breath sounds: Normal breath sounds. No wheezing or rales.  Abdominal:     Palpations: Abdomen is soft.  Musculoskeletal:     Cervical back: Neck supple.     Right lower leg: No edema.     Comments: Left BKA  Skin:    General: Skin is warm.     Capillary Refill: Capillary refill takes less than 2 seconds.  Neurological:     General: No focal deficit present.     Mental Status: He is alert. Mental status is at baseline.     Motor: Weakness present.     Gait: Gait abnormal.     Comments: Slowed speech  Psychiatric:        Mood and Affect: Mood normal.     Labs reviewed: Recent Labs    09/05/22 0000 01/15/23 0000  NA 138 135*  K 4.3 4.3  CL 103 99  CO2 28* 28*  BUN 34* 25*   CREATININE 1.0 1.0  CALCIUM  8.9 8.6*   Recent Labs    09/05/22 0000  AST 13*  ALT 7*  ALKPHOS 73  ALBUMIN  3.8   Recent Labs    09/05/22 0000  WBC 6.0  HGB 13.4*  HCT 40*  PLT 176   Lab Results  Component Value Date   TSH 3.07 09/05/2022   Lab Results  Component Value Date   HGBA1C 6.0 09/05/2022   No results found for: "CHOL", "HDL", "LDLCALC", "LDLDIRECT", "TRIG", "CHOLHDL"  Significant Diagnostic Results in last 30 days:  No results found.  Assessment/Plan 1. Acute cough (Primary) - ongoing - 04/25 CXR negative for consolidation, effusion, chronic lung changes present - 04/25 Augmentin started for suspected aspiration - lung sounds clear  - coughing more at meal times  - suspect worsening dysphagia - ST evaluation scheduled today  2. Parkinson's disease with fluctuating manifestations, unspecified whether dyskinesia present (HCC) - ongoing - followed by neurology> Dr. Winferd Hatter - see above - sleeping more, 2+ person assist with ambulation, hoyer transfer - needing more feeding assistance - suspect overall physical decline associated with disease progression - weight stable - cont sinemet  - cont skilled nursing     Family/ staff Communication: plan discussed with patient and nurse  Labs/tests ordered:  ST evaluation pending

## 2023-08-04 ENCOUNTER — Non-Acute Institutional Stay (SKILLED_NURSING_FACILITY): Payer: Self-pay | Admitting: Orthopedic Surgery

## 2023-08-04 ENCOUNTER — Encounter: Payer: Self-pay | Admitting: Orthopedic Surgery

## 2023-08-04 DIAGNOSIS — D649 Anemia, unspecified: Secondary | ICD-10-CM | POA: Diagnosis not present

## 2023-08-04 DIAGNOSIS — E538 Deficiency of other specified B group vitamins: Secondary | ICD-10-CM | POA: Diagnosis not present

## 2023-08-04 DIAGNOSIS — R051 Acute cough: Secondary | ICD-10-CM | POA: Diagnosis not present

## 2023-08-04 DIAGNOSIS — D508 Other iron deficiency anemias: Secondary | ICD-10-CM | POA: Diagnosis not present

## 2023-08-04 NOTE — Progress Notes (Addendum)
 Location:  [facility] Room Number: 27/A Place of Service:  SNF [phone]) Provider:  Arnetha Bhat, NP   Marguerite Shiley, MD  Patient Care Team: Marguerite Shiley, MD as PCP - General (Internal Medicine) [name], Von Grumbling, DO as Consulting Physician (Neurology)  Extended Emergency Contact Information Primary Emergency Contact: Doris,jane Address: [address] [place].          apt [phone] [place] Home Phone: [phone] Mobile Phone: [phone] Relation: Spouse Secondary Emergency Contact: Ormond,kenneth Home Phone: [phone] Mobile Phone: [phone] Relation: Son  Code Status:  DNR Goals of care: Advanced Directive information    05/29/2023    3:50 PM  Advanced Directives  Does Patient Have a Medical Advance Directive? Yes  Type of Advance Directive Living will  Does patient want to make changes to medical advance directive? No - Patient declined     Chief Complaint  Patient presents with   Acute Visit    Low hemoglobin    HPI:  Pt is a 89 y.o. male seen today for acute visit due to low hemoglobin.   He currently resides on the skilled nursing unit at [facility]. PMH: HTN, HLD, HFpEF, parkinson's, PBA, acute DVT right lower leg 12/27/2020, osteoporosis (past Fosamax use), BPH, CKD, ORIF right acetabular 10/2020, PNA LLL 12/29/2022, and unstable gait.   Recent lab work reveals hgb 9.9 (04/28)> was 12.0 (01/06). Hct 32.2, MCV 86.8, MCH 26.7, MCHC 30.7, platelets 350. No sign of blood loss per nursing. He is taking ferrous sulfate twice weekly and vitamin B12. He has been sleeping more during the day. Nursing has recently had concerns parkinson's has progressed.   04/25 concerns for aspiration pneumonia. CXR was negative. He was started on Augmentin. 04/28 he reported feeling better and was more responsive. He continues to have increased cough around mealtime. ST evaluation was performed yesterday. They plan to come daily  this week. Afebrile. Vitals stable.    Past Medical History:  Diagnosis Date   Acute bronchiolitis    Acute osteomyelitis of calcaneum, left (HCC) 01/11/2021   Anemia    Bursitis of both hips    Complication of anesthesia    wife states pt was a little "loopy" for several days   Dementia (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   Hearing reduced    hearing aid   History of basal cell carcinoma    Hyperlipidemia    Malaise and fatigue    MRSA infection 01/11/2021   Osteoarthritis    hand   Osteoporosis    Palpitation    Parkinson's disease (HCC)    Peripheral vascular disease (HCC)    blood clots   Pre-diabetes    Vitamin D  deficiency    Past Surgical History:  Procedure Laterality Date   ABDOMINAL AORTOGRAM W/LOWER EXTREMITY N/A 06/12/2021   Procedure: ABDOMINAL AORTOGRAM W/LOWER EXTREMITY;  Surgeon: Wenona Hamilton, MD;  Location: [facility] INV[facility];  Service: Cardiovascular;  Laterality: N/A;   AMPUTATION Left 07/03/2021   Procedure: LEFT BELOW KNEE AMPUTATION;  Surgeon: Timothy Ford, MD;  Location: [facility] OR;  Service: Orthopedics;  Laterality: Left;   CATARACT EXTRACTION  05/31/2015   Eye/right   COLONOSCOPY  02/05/2005, 03/06/2005   normal, 10 years ago   MOHS micrographic surgery     Face surgery   ORIF ACETABULAR FRACTURE Right 10/09/2020   Procedure: OPEN REDUCTION INTERNAL FIXATION (ORIF) ACETABULAR FRACTURE;  Surgeon: Hardy Lia, MD;  Location: [facility] OR;  Service: Orthopedics;  Laterality: Right;    No Known Allergies  Outpatient Encounter Medications as of 08/04/2023  Medication Sig   acetaminophen  (TYLENOL ) 500 MG tablet Take 1,000 mg by mouth as needed.   acetaminophen  (TYLENOL ) 500 MG tablet Take 1,000 mg by mouth every morning.   albuterol  (VENTOLIN  HFA) 108 (90 Base) MCG/ACT inhaler Inhale 2 puffs into the lungs every 4 (four) hours as needed for wheezing or shortness of breath.   amoxicillin-clavulanate (AUGMENTIN) 875-125 MG tablet Take 1 tablet by mouth 2 (two)  times daily for 7 days.   Ascorbic Acid  (VITAMIN C) 1000 MG tablet Take 1,000 mg by mouth daily.   calcium  carbonate (CALCIUM  600) 600 MG TABS tablet Take 600 mg by mouth every morning.   calcium  carbonate (TUMS - DOSED IN MG ELEMENTAL CALCIUM ) 500 MG chewable tablet Chew 1 tablet by mouth every 8 (eight) hours as needed for indigestion or heartburn.   CALCIUM  CARBONATE ANTACID PO Take 1 tablet by mouth every 8 (eight) hours as needed.   carbidopa -levodopa  (SINEMET  CR) 50-200 MG tablet Take 1 tablet by mouth at bedtime.   carbidopa -levodopa  (SINEMET  IR) 25-100 MG tablet Take 1 tablet by mouth 4 (four) times daily.   Carboxymethylcellulose Sodium (ARTIFICIAL TEARS OP) Place 1 drop into both eyes every morning.   ferrous sulfate 325 (65 FE) MG tablet Take 325 mg by mouth 2 (two) times a week. Monday & Thursday   GUAIFENESIN  PO Take 10 mLs by mouth every 6 (six) hours as needed.   ipratropium-albuterol  (DUONEB) 0.5-2.5 (3) MG/3ML SOLN Take 3 mLs by nebulization 3 (three) times daily as needed.   Multiple Vitamins-Minerals (PRESERVISION AREDS 2 PO) Take 1 capsule by mouth in the morning and at bedtime.   nystatin  (MYCOSTATIN /NYSTOP ) powder Apply 1 Application topically every 12 (twelve) hours as needed.   omeprazole  (PRILOSEC) 20 MG capsule Take 1 capsule (20 mg total) by mouth daily before breakfast.   polyethylene glycol (MIRALAX  / GLYCOLAX ) 17 g packet Take 17 g by mouth daily.   senna (SENOKOT) 8.6 MG tablet Take 1 tablet by mouth every other day.   torsemide  (DEMADEX ) 20 MG tablet Take 20 mg by mouth 3 (three) times a week.   vitamin B-12 (CYANOCOBALAMIN) 1000 MCG tablet Take 1,000 mcg by mouth daily.   Vitamin D , Cholecalciferol , 25 MCG (1000 UT) TABS Take 1,000 Units by mouth daily.   White Petrolatum-Mineral Oil (ARTIFICIAL TEARS) 83-15 % OINT Place 1 Application into both eyes at bedtime.   zinc  oxide 20 % ointment Apply 1 application. topically See admin instructions. To buttocks after  every incontinent episode and as needed for redness.   No facility-administered encounter medications on file as of 08/04/2023.    Review of Systems  Constitutional:  Positive for fatigue. Negative for fever.  HENT:  Negative for sore throat and trouble swallowing.   Eyes:  Negative for visual disturbance.  Respiratory:  Negative for cough and shortness of breath.   Cardiovascular:  Negative for chest pain and leg swelling.  Gastrointestinal:  Negative for abdominal distention and abdominal pain.  Genitourinary:  Negative for dysuria and frequency.  Musculoskeletal:  Positive for gait problem.  Skin:  Negative for wound.  Neurological:  Positive for weakness. Negative for dizziness and light-headedness.  Psychiatric/Behavioral:  Positive for confusion. Negative for dysphoric mood and sleep disturbance. The patient is not nervous/anxious.     Immunization History  Administered Date(s) Administered   Fluad Quad(high Dose 65+) 01/29/2022  H1N1 06/13/2008   Influenza Split 03/05/2006, 02/26/2007, 01/26/2008, 01/18/2009, 12/12/2009, 01/16/2011, 01/29/2012, 02/01/2013, 02/08/2014, 02/13/2015, 01/10/2016, 02/23/2017, 02/24/2018, 01/24/2019, 12/22/2019   Influenza, High Dose Seasonal PF 01/30/2021, 02/04/2023   Moderna Covid-19 Fall Seasonal Vaccine 81yrs & older 02/11/2023   Moderna SARS-COV2 Booster Vaccination 09/18/2021   PFIZER(Purple Top)SARS-COV-2 Vaccination 05/04/2019, 05/25/2019, 09/01/2020, 02/11/2022   Pfizer Covid-19 Vaccine Bivalent Booster 46yrs & up 12/27/2019, 12/26/2020   Pfizer(Comirnaty)Fall Seasonal Vaccine 12 years and older 02/11/2022   Pneumococcal Conjugate-13 02/10/2014   Pneumococcal Polysaccharide-23 06/10/2011   Tdap 06/05/2022   Zoster Recombinant(Shingrix) 07/04/2009, 03/24/2022   Pertinent  Health Maintenance Due  Topic Date Due   INFLUENZA VACCINE  11/06/2023      12/01/2022    9:07 AM 02/23/2023    2:34 PM 03/02/2023    3:44 PM 03/12/2023   12:56 PM  07/31/2023    1:33 PM  Fall Risk  Falls in the past year? 0  0 0 0  Was there an injury with Fall? 0 1 0 0 0  Fall Risk Category Calculator 0  0 0 0  Patient at Risk for Falls Due to History of fall(s);Impaired balance/gait;Impaired mobility History of fall(s);Impaired balance/gait;Impaired mobility History of fall(s);Impaired balance/gait;Impaired mobility  History of fall(s);Impaired balance/gait;Impaired mobility  Fall risk Follow up Falls evaluation completed;Education provided;Falls prevention discussed Falls evaluation completed;Education provided;Falls prevention discussed Falls evaluation completed;Education provided;Falls prevention discussed Falls evaluation completed Falls evaluation completed;Education provided   Functional Status Survey:    There were no vitals filed for this visit. There is no height or weight on file to calculate BMI. Physical Exam Vitals reviewed.  Constitutional:      General: He is not in acute distress. HENT:     Head: Normocephalic.  Eyes:     General:        Right eye: No discharge.        Left eye: No discharge.  Cardiovascular:     Rate and Rhythm: Normal rate and regular rhythm.     Pulses: Normal pulses.     Heart sounds: Normal heart sounds.  Pulmonary:     Effort: Pulmonary effort is normal.     Breath sounds: Normal breath sounds.  Abdominal:     General: Bowel sounds are normal.     Palpations: Abdomen is soft.  Musculoskeletal:     Cervical back: Neck supple.     Right lower leg: No edema.     Comments: Left BKA  Skin:    General: Skin is warm.     Capillary Refill: Capillary refill takes less than 2 seconds.  Neurological:     General: No focal deficit present.     Mental Status: He is alert. Mental status is at baseline.     Motor: Weakness present.     Gait: Gait abnormal.     Comments: Slowed speech, rigidity to extremities  Psychiatric:        Mood and Affect: Mood normal.     Labs reviewed: Recent Labs     09/05/22 0000 01/15/23 0000  NA 138 135*  K 4.3 4.3  CL 103 99  CO2 28* 28*  BUN 34* 25*  CREATININE 1.0 1.0  CALCIUM  8.9 8.6*   Recent Labs    09/05/22 0000  AST 13*  ALT 7*  ALKPHOS 73  ALBUMIN  3.8   Recent Labs    09/05/22 0000  WBC 6.0  HGB 13.4*  HCT 40*  PLT 176   Lab Results  Component Value  Date   TSH 3.07 09/05/2022   Lab Results  Component Value Date   HGBA1C 6.0 09/05/2022   No results found for: "CHOL", "HDL", "LDLCALC", "LDLDIRECT", "TRIG", "CHOLHDL"  Significant Diagnostic Results in last 30 days:  No results found.  Assessment/Plan 1. Low hemoglobin (Primary) - hgb 9.9 (04/28)> was 12.0 (01/06) - recently fatigued and sleeping more - no blood loss per nursing - blood pressures have been lower, no tachycardia - on ferrous sulfate 2x/week and B12 - hemoccults x 3   2. Iron deficiency anemia secondary to inadequate dietary iron intake - see above - recent MCV normal - iron, TIBC, ferritin 05/01 - consider increasing ferrous sulfate   3. Acute cough - improved  - CXR unremarkable - 04/25 started on Augmentin for suspected aspiration PNA - ST evaluation yesterday> plan to see daily this week - suspect dysphagia  4. Vitamin B12 deficiency - see above - on B12   Family/ staff Communication: plan discussed with patient and nurse  Labs/tests ordered:  hemoccults x 3, iron/TIBC/ferritin, B12 08/06/2023

## 2023-08-06 LAB — IRON,TIBC AND FERRITIN PANEL
%SAT: 14
Ferritin: 317
Iron: 27
TIBC: 196

## 2023-08-06 LAB — VITAMIN B12: Vitamin B-12: 1108

## 2023-08-11 ENCOUNTER — Encounter: Payer: Self-pay | Admitting: Orthopedic Surgery

## 2023-08-11 ENCOUNTER — Non-Acute Institutional Stay (SKILLED_NURSING_FACILITY): Payer: Self-pay | Admitting: Orthopedic Surgery

## 2023-08-11 DIAGNOSIS — R1310 Dysphagia, unspecified: Secondary | ICD-10-CM

## 2023-08-11 DIAGNOSIS — G20A1 Parkinson's disease without dyskinesia, without mention of fluctuations: Secondary | ICD-10-CM | POA: Diagnosis not present

## 2023-08-11 DIAGNOSIS — R634 Abnormal weight loss: Secondary | ICD-10-CM

## 2023-08-11 DIAGNOSIS — L89312 Pressure ulcer of right buttock, stage 2: Secondary | ICD-10-CM | POA: Diagnosis not present

## 2023-08-11 NOTE — Progress Notes (Signed)
 Location:  [facility] Room Number: N27-A Place of Service:  SNF [phone]) Provider:  Rupert Counts, MD  Patient Care Team: Greg Shiley, MD as PCP - General (Internal Medicine) Greg, Von Grumbling, DO as Consulting Physician (Neurology)  Extended Emergency Contact Information Primary Emergency Contact: Greg,[name] Address: [address] [place].          apt [phone] [place] Home Phone: [phone] Mobile Phone: [phone] Relation: Spouse Secondary Emergency Contact: Greg,[name] Home Phone: [phone] Mobile Phone: [phone] Relation: Son  Code Status:  DNR Goals of care: Advanced Directive information    08/11/2023   10:05 AM  Advanced Directives  Does Patient Have a Medical Advance Directive? Yes  Type of Estate agent of Greg [name];Living will  Does patient want to make changes to medical advance directive? No - Patient declined  Copy of Healthcare Power of Attorney in Chart? Yes - validated most recent copy scanned in chart (See row information)     Chief Complaint  Patient presents with   Acute Visit    Skin breakdown    HPI:  Pt is a 89 y.o. male seen today for acute visit due to skin breakdown.   He currently resides on the skilled nursing unit at [facility]. PMH: HTN, HLD, HFpEF, parkinson's, PBA, acute DVT right lower leg 12/27/2020, osteoporosis (past Fosamax use), BPH, CKD, ORIF right acetabular 10/2020, PNA LLL 12/29/2022, and unstable gait.   04/25 concerns for aspiration pneumonia. Completed Augmentin . ST evaluation recommended due to onset of new cough with eating. Mechanical soft foods and ground meats recommended. He is more fatigued during meals. He is unable to hold eating utensils. Wife has been feeding him 3 meals daily.   04/30 started on high calorie Boost per dietary.  Recent weights:   05/05- 164.8 lbs  04/01- 170.1 lbs  03/01- 177.4  lbs  Continues to sleep more during day. Hoyer transfer. Right buttocks with 2 areas of skin breakdown. He does sit on foam wheelchair pad. Nursing has been applying zinc  oxide cream.        Past Medical History:  Diagnosis Date   Acute bronchiolitis    Acute osteomyelitis of calcaneum, left (HCC) 01/11/2021   Anemia    Bursitis of both hips    Complication of anesthesia    wife states pt was a little "loopy" for several days   Dementia (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   Hearing reduced    hearing aid   History of basal cell carcinoma    Hyperlipidemia    Malaise and fatigue    MRSA infection 01/11/2021   Osteoarthritis    hand   Osteoporosis    Palpitation    Parkinson's disease (HCC)    Peripheral vascular disease (HCC)    blood clots   Pre-diabetes    Vitamin D  deficiency    Past Surgical History:  Procedure Laterality Date   ABDOMINAL AORTOGRAM W/LOWER EXTREMITY N/A 06/12/2021   Procedure: ABDOMINAL AORTOGRAM W/LOWER EXTREMITY;  Surgeon: Greg Hamilton, MD;  Location: [facility] INVASIVE CV LAB;  Service: Cardiovascular;  Laterality: N/A;   AMPUTATION Left 07/03/2021   Procedure: LEFT BELOW KNEE AMPUTATION;  Surgeon: Greg Ford, MD;  Location: [facility] OR;  Service: Orthopedics;  Laterality: Left;   CATARACT EXTRACTION  05/31/2015   Eye/right   COLONOSCOPY  02/05/2005, 03/06/2005   normal, 10 years ago   MOHS micrographic surgery  Face surgery   ORIF ACETABULAR FRACTURE Right 10/09/2020   Procedure: OPEN REDUCTION INTERNAL FIXATION (ORIF) ACETABULAR FRACTURE;  Surgeon: Greg Lia, MD;  Location: [facility] OR;  Service: Orthopedics;  Laterality: Right;    No Known Allergies  Outpatient Encounter Medications as of 08/11/2023  Medication Sig   acetaminophen  (TYLENOL ) 500 MG tablet Take 1,000 mg by mouth as needed.   acetaminophen  (TYLENOL ) 500 MG tablet Take 1,000 mg by mouth every morning.   albuterol  (VENTOLIN  HFA) 108 (90 Base) MCG/ACT inhaler Inhale 2 puffs into the lungs  every 4 (four) hours as needed for wheezing or shortness of breath.   ascorbic acid  (VITAMIN C) 500 MG tablet Take 500 mg by mouth daily.   calcium  carbonate (CALCIUM  600) 600 MG TABS tablet Take 600 mg by mouth every morning.   calcium  carbonate (TUMS - DOSED IN MG ELEMENTAL CALCIUM ) 500 MG chewable tablet Chew 1 tablet by mouth every 8 (eight) hours as needed for indigestion or heartburn.   carbidopa -levodopa  (SINEMET  CR) 50-200 MG tablet Take 1 tablet by mouth at bedtime.   carbidopa -levodopa  (SINEMET  IR) 25-100 MG tablet Take 1 tablet by mouth 4 (four) times daily.   Carboxymethylcellulose Sodium (ARTIFICIAL TEARS OP) Place 1 drop into both eyes every morning.   ferrous sulfate 325 (65 FE) MG tablet Take 325 mg by mouth 2 (two) times a week. Monday & Thursday   GUAIFENESIN  PO Take 10 mLs by mouth every 6 (six) hours as needed.   ipratropium-albuterol  (DUONEB) 0.5-2.5 (3) MG/3ML SOLN Take 3 mLs by nebulization 3 (three) times daily as needed.   Multiple Vitamins-Minerals (PRESERVISION AREDS 2 PO) Take 1 capsule by mouth in the morning and at bedtime.   nystatin  (MYCOSTATIN /NYSTOP ) powder Apply 1 Application topically every 12 (twelve) hours as needed.   omeprazole  (PRILOSEC) 20 MG capsule Take 1 capsule (20 mg total) by mouth daily before breakfast.   polyethylene glycol (MIRALAX  / GLYCOLAX ) 17 g packet Take 17 g by mouth daily.   senna (SENOKOT) 8.6 MG tablet Take 1 tablet by mouth every other day.   torsemide  (DEMADEX ) 20 MG tablet Take 20 mg by mouth 3 (three) times a week.   vitamin B-12 (CYANOCOBALAMIN) 1000 MCG tablet Take 1,000 mcg by mouth daily.   Vitamin D , Cholecalciferol , 25 MCG (1000 UT) TABS Take 1,000 Units by mouth daily.   White Petrolatum-Mineral Oil (ARTIFICIAL TEARS) 83-15 % OINT Place 1 Application into both eyes at bedtime.   zinc  oxide 20 % ointment Apply 1 application. topically See admin instructions. To buttocks after every incontinent episode and as needed for  redness.   Ascorbic Acid  (VITAMIN C) 1000 MG tablet Take 1,000 mg by mouth daily. (Patient not taking: Reported on 08/11/2023)   CALCIUM  CARBONATE ANTACID PO Take 1 tablet by mouth every 8 (eight) hours as needed. (Patient not taking: Reported on 08/11/2023)   No facility-administered encounter medications on file as of 08/11/2023.    Review of Systems  Constitutional:  Positive for fatigue and unexpected weight change. Negative for fever.  HENT:  Positive for trouble swallowing. Negative for sore throat.   Eyes:  Negative for visual disturbance.  Respiratory:  Positive for cough. Negative for shortness of breath and wheezing.   Cardiovascular:  Positive for leg swelling. Negative for chest pain.  Gastrointestinal:  Negative for abdominal distention and abdominal pain.  Genitourinary:  Negative for dysuria, frequency and hematuria.  Musculoskeletal:  Positive for arthralgias and gait problem.  Skin:  Positive for wound.  Neurological:  Positive for weakness. Negative for dizziness and headaches.  Psychiatric/Behavioral:  Positive for confusion. Negative for dysphoric mood and sleep disturbance. The patient is not nervous/anxious.     Immunization History  Administered Date(s) Administered   Fluad Quad(high Dose 65+) 01/29/2022   H1N1 06/13/2008   Influenza Split 03/05/2006, 02/26/2007, 01/26/2008, 01/18/2009, 12/12/2009, 01/16/2011, 01/29/2012, 02/01/2013, 02/08/2014, 02/13/2015, 01/10/2016, 02/23/2017, 02/24/2018, 01/24/2019, 12/22/2019   Influenza, High Dose Seasonal PF 01/30/2021, 02/04/2023   Moderna Covid-19 Fall Seasonal Vaccine 10yrs & older 02/11/2023   Moderna SARS-COV2 Booster Vaccination 09/18/2021   PFIZER(Purple Top)SARS-COV-2 Vaccination 05/04/2019, 05/25/2019, 09/01/2020, 02/11/2022   Pfizer Covid-19 Vaccine Bivalent Booster 107yrs & up 12/27/2019, 12/26/2020   Pfizer(Comirnaty)Fall Seasonal Vaccine 12 years and older 02/11/2022   Pneumococcal Conjugate-13 02/10/2014    Pneumococcal Polysaccharide-23 06/10/2011   Tdap 06/05/2022   Zoster Recombinant(Shingrix) 07/04/2009, 03/24/2022   Pertinent  Health Maintenance Due  Topic Date Due   INFLUENZA VACCINE  11/06/2023      12/01/2022    9:07 AM 02/23/2023    2:34 PM 03/02/2023    3:44 PM 03/12/2023   12:56 PM 07/31/2023    1:33 PM  Fall Risk  Falls in the past year? 0  0 0 0  Was there an injury with Fall? 0 1 0 0 0  Fall Risk Category Calculator 0  0 0 0  Patient at Risk for Falls Due to History of fall(s);Impaired balance/gait;Impaired mobility History of fall(s);Impaired balance/gait;Impaired mobility History of fall(s);Impaired balance/gait;Impaired mobility  History of fall(s);Impaired balance/gait;Impaired mobility  Fall risk Follow up Falls evaluation completed;Education provided;Falls prevention discussed Falls evaluation completed;Education provided;Falls prevention discussed Falls evaluation completed;Education provided;Falls prevention discussed Falls evaluation completed Falls evaluation completed;Education provided   Functional Status Survey:    Vitals:   08/11/23 1003  BP: 132/74  Pulse: 77  Resp: 12  Temp: 98.7 F (37.1 C)  SpO2: 90%  Weight: 164 lb 12.8 oz (74.8 kg)  Height: 5\' 10"  (1.778 m)   Body mass index is 23.65 kg/m. Physical Exam Vitals reviewed.  Constitutional:      General: He is not in acute distress. HENT:     Head: Normocephalic.  Eyes:     General:        Right eye: No discharge.        Left eye: No discharge.  Cardiovascular:     Rate and Rhythm: Normal rate and regular rhythm.     Pulses: Normal pulses.     Heart sounds: Murmur heard.  Pulmonary:     Effort: Pulmonary effort is normal. No respiratory distress.     Breath sounds: Normal breath sounds. No wheezing, rhonchi or rales.  Abdominal:     General: Bowel sounds are normal. There is no distension.     Palpations: Abdomen is soft.     Tenderness: There is no abdominal tenderness.   Musculoskeletal:     Cervical back: Neck supple.     Right lower leg: No edema.     Comments: Left BKA  Skin:    General: Skin is warm.     Capillary Refill: Capillary refill takes less than 2 seconds.     Comments: Stage II PU to right buttock x 2, approx nickel sized, nonblanchable, skin scaly appearance  Neurological:     General: No focal deficit present.     Mental Status: He is alert. Mental status is at baseline.     Motor: Weakness present.     Gait: Gait abnormal.  Comments: Increased rigidity to extremities, hands contracted, slowed speech and responsiveness  Psychiatric:        Mood and Affect: Mood normal.     Labs reviewed: Recent Labs    01/15/23 0000 04/14/23 0000 08/03/23 0000  NA 135* 138 136*  K 4.3 4.8 4.6  CL 99 99 98*  CO2 28* 27* 25*  BUN 25* 23* 19  CREATININE 1.0 0.9 0.8  CALCIUM  8.6* 9.0 8.4*   Recent Labs    09/05/22 0000  AST 13*  ALT 7*  ALKPHOS 73  ALBUMIN  3.8   Recent Labs    09/05/22 0000 04/14/23 0000 08/03/23 0000  WBC 6.0 6.7 7.5  NEUTROABS  --  4,576.00 5,378.00  HGB 13.4* 12.0* 9.9*  HCT 40* 36* 32*  PLT 176 314 350   Lab Results  Component Value Date   TSH 3.07 09/05/2022   Lab Results  Component Value Date   HGBA1C 6.0 09/05/2022   No results found for: "CHOL", "HDL", "LDLCALC", "LDLDIRECT", "TRIG", "CHOLHDL"  Significant Diagnostic Results in last 30 days:  No results found.  Assessment/Plan 1. Pressure injury of right buttock, stage 2 (HCC) (Primary) - 2 non blanchable areas with scaly appearance - suspect from prolonged sitting - using wheelchair foam pad - start foam dressing every 3 days until healed - cont zinc  oxide to pressure points  2. Dysphagia, unspecified type - 04/25 cough with suspected aspiration - ST recommended mechanical soft and ground meats - cont thin liquids  3. Weight loss - weight 164.8 lbs> was 177 lbs 06/06/2023  - BMI 23.65 - 04/30 BOOST BID started - suspect  associated with parkinson's progression - now requiring feeding assist  4. Parkinson's disease without fluctuating manifestations, unspecified whether dyskinesia present (HCC) - late stages - now dependent with ADLs including feeding - progressive weight loss  - cont sinemet  - recommend hospice if weight loss progresses    Family/ staff Communication: plan discussed with patient, wife and nurse  Labs/tests ordered:  none

## 2023-08-24 ENCOUNTER — Encounter: Payer: Self-pay | Admitting: Orthopedic Surgery

## 2023-08-24 ENCOUNTER — Non-Acute Institutional Stay (SKILLED_NURSING_FACILITY): Payer: Self-pay | Admitting: Orthopedic Surgery

## 2023-08-24 DIAGNOSIS — G20A2 Parkinson's disease without dyskinesia, with fluctuations: Secondary | ICD-10-CM

## 2023-08-24 DIAGNOSIS — R1310 Dysphagia, unspecified: Secondary | ICD-10-CM | POA: Diagnosis not present

## 2023-08-24 DIAGNOSIS — S88112S Complete traumatic amputation at level between knee and ankle, left lower leg, sequela: Secondary | ICD-10-CM | POA: Diagnosis not present

## 2023-08-24 DIAGNOSIS — D508 Other iron deficiency anemias: Secondary | ICD-10-CM

## 2023-08-24 DIAGNOSIS — E440 Moderate protein-calorie malnutrition: Secondary | ICD-10-CM

## 2023-08-24 DIAGNOSIS — R7303 Prediabetes: Secondary | ICD-10-CM

## 2023-08-24 DIAGNOSIS — R600 Localized edema: Secondary | ICD-10-CM

## 2023-08-24 NOTE — Progress Notes (Signed)
 Location:  [facility] Room Number: 27/A Place of Service:  SNF [phone]) Provider:  Arnetha Bhat, NP   Marguerite Shiley, MD  Patient Care Team: Marguerite Shiley, MD as PCP - General (Internal Medicine) [name], Von Grumbling, DO as Consulting Physician (Neurology)  Extended Emergency Contact Information Primary Emergency Contact: Casebier,jane Address: [address] [place].          apt [phone] [place] Home Phone: [phone] Mobile Phone: [phone] Relation: Spouse Secondary Emergency Contact: Bischoff,kenneth Home Phone: [phone] Mobile Phone: [phone] Relation: Son  Code Status:  DNR Goals of care: Advanced Directive information    08/11/2023   10:05 AM  Advanced Directives  Does Patient Have a Medical Advance Directive? Yes  Type of Estate agent of [place];Living will  Does patient want to make changes to medical advance directive? No - Patient declined  Copy of Healthcare Power of Attorney in Chart? Yes - validated most recent copy scanned in chart (See row information)     Chief Complaint  Patient presents with   Medical Management of Chronic Issues    HPI:  Pt is a 89 y.o. male seen today for medical management of chronic diseases.    He currently resides on the skilled nursing unit at [facility]. PMH: HTN, HLD, HFpEF, parkinson's, PBA, acute DVT right lower leg 12/27/2020, osteoporosis (past Fosamax use), BPH, CKD, ORIF right acetabular 10/2020, PNA LLL 12/29/2022, and unstable gait.    Moderate malnutrition- see weigh trends below, BMI 23.65, followed by dietary, on BOOST and weekly weights Dysphagia- 04/25 concerns for aspiration PNA> improved with Augmentin , followed by ST> chopped meats/ feeding assist recommended  Parkinson's-  diagnosed 2019, now hoyer transfer and feeding assist, napping more during day per wife, recent BIMS 13/15 (05/13)> was 15/15 (11/14), remains on sinemet  S/p left  BKA- surgery 07/03/2021 by Dr. Julio Ohm due to ongoing osteomyelitis (+MRSA) of left heel, no phantom pain, uses prosthesis to ambulate/ wheelchair for long distances Edema- LV EF 60-65% 02/20/2021, improved since amputation, compression hose to right leg daily, torsemide  reduced to 3x/week 05/30 Prediabetes-  A1c 6.0 (05/31)> was 5.8 01/16/2022 Iron deficiency anemia- hgb 9.9 (04/28)> was 12.0 (01/06), 04/25 hemoccults ordered> not completed, remains on ferrous sulfate M/W/F  No recent falls or injuries.   Recent blood pressures:  05/13- 96/55  05/06- 127/73  04/29- 132/74  Recent weights:  05/07- 164.8 lbs  05/01- 164.8 lbs  04/01- 170.1 lbs  03/01- 177.4 lbs  Past Medical History:  Diagnosis Date   Acute bronchiolitis    Acute osteomyelitis of calcaneum, left (HCC) 01/11/2021   Anemia    Bursitis of both hips    Complication of anesthesia    wife states pt was a little "loopy" for several days   Dementia (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   Hearing reduced    hearing aid   History of basal cell carcinoma    Hyperlipidemia    Malaise and fatigue    MRSA infection 01/11/2021   Osteoarthritis    hand   Osteoporosis    Palpitation    Parkinson's disease (HCC)    Peripheral vascular disease (HCC)    blood clots   Pre-diabetes    Vitamin D  deficiency    Past Surgical History:  Procedure Laterality Date   ABDOMINAL AORTOGRAM W/LOWER EXTREMITY N/A 06/12/2021   Procedure: ABDOMINAL AORTOGRAM W/LOWER EXTREMITY;  Surgeon: Wenona Hamilton, MD;  Location: [facility]  INVASIVE CV LAB;  Service: Cardiovascular;  Laterality: N/A;   AMPUTATION Left 07/03/2021   Procedure: LEFT BELOW KNEE AMPUTATION;  Surgeon: Timothy Ford, MD;  Location: [facility] OR;  Service: Orthopedics;  Laterality: Left;   CATARACT EXTRACTION  05/31/2015   Eye/right   COLONOSCOPY  02/05/2005, 03/06/2005   normal, 10 years ago   MOHS micrographic surgery     Face surgery   ORIF ACETABULAR FRACTURE Right 10/09/2020   Procedure: OPEN  REDUCTION INTERNAL FIXATION (ORIF) ACETABULAR FRACTURE;  Surgeon: Hardy Lia, MD;  Location: [facility] OR;  Service: Orthopedics;  Laterality: Right;    No Known Allergies  Outpatient Encounter Medications as of 08/24/2023  Medication Sig   acetaminophen  (TYLENOL ) 500 MG tablet Take 1,000 mg by mouth as needed.   acetaminophen  (TYLENOL ) 500 MG tablet Take 1,000 mg by mouth every morning.   albuterol  (VENTOLIN  HFA) 108 (90 Base) MCG/ACT inhaler Inhale 2 puffs into the lungs every 4 (four) hours as needed for wheezing or shortness of breath.   ascorbic acid  (VITAMIN C) 500 MG tablet Take 500 mg by mouth daily.   calcium  carbonate (CALCIUM  600) 600 MG TABS tablet Take 600 mg by mouth every morning.   calcium  carbonate (TUMS - DOSED IN MG ELEMENTAL CALCIUM ) 500 MG chewable tablet Chew 1 tablet by mouth every 8 (eight) hours as needed for indigestion or heartburn.   carbidopa -levodopa  (SINEMET  CR) 50-200 MG tablet Take 1 tablet by mouth at bedtime.   carbidopa -levodopa  (SINEMET  IR) 25-100 MG tablet Take 1 tablet by mouth 4 (four) times daily.   Carboxymethylcellulose Sodium (ARTIFICIAL TEARS OP) Place 1 drop into both eyes every morning.   ferrous sulfate 325 (65 FE) MG tablet Take 325 mg by mouth 2 (two) times a week. Monday & Thursday   GUAIFENESIN  PO Take 10 mLs by mouth every 6 (six) hours as needed.   ipratropium-albuterol  (DUONEB) 0.5-2.5 (3) MG/3ML SOLN Take 3 mLs by nebulization 3 (three) times daily as needed.   Multiple Vitamins-Minerals (PRESERVISION AREDS 2 PO) Take 1 capsule by mouth in the morning and at bedtime.   nystatin  (MYCOSTATIN /NYSTOP ) powder Apply 1 Application topically every 12 (twelve) hours as needed.   omeprazole  (PRILOSEC) 20 MG capsule Take 1 capsule (20 mg total) by mouth daily before breakfast.   polyethylene glycol (MIRALAX  / GLYCOLAX ) 17 g packet Take 17 g by mouth daily.   senna (SENOKOT) 8.6 MG tablet Take 1 tablet by mouth every other day.   torsemide  (DEMADEX ) 20  MG tablet Take 20 mg by mouth 3 (three) times a week.   vitamin B-12 (CYANOCOBALAMIN) 1000 MCG tablet Take 1,000 mcg by mouth daily.   Vitamin D , Cholecalciferol , 25 MCG (1000 UT) TABS Take 1,000 Units by mouth daily.   White Petrolatum-Mineral Oil (ARTIFICIAL TEARS) 83-15 % OINT Place 1 Application into both eyes at bedtime.   zinc  oxide 20 % ointment Apply 1 application. topically See admin instructions. To buttocks after every incontinent episode and as needed for redness.   No facility-administered encounter medications on file as of 08/24/2023.    Review of Systems  Constitutional:  Positive for fatigue. Negative for fever.  HENT:  Positive for trouble swallowing. Negative for sore throat.   Respiratory:  Positive for cough. Negative for shortness of breath and wheezing.   Cardiovascular:  Negative for chest pain and leg swelling.  Gastrointestinal:  Negative for abdominal distention and abdominal pain.  Genitourinary:  Negative for dysuria and frequency.  Musculoskeletal:  Positive for arthralgias and  gait problem.  Skin:  Positive for wound.  Neurological:  Positive for tremors and weakness. Negative for dizziness and headaches.  Psychiatric/Behavioral:  Positive for confusion. Negative for dysphoric mood and sleep disturbance. The patient is not nervous/anxious.     Immunization History  Administered Date(s) Administered   Fluad Quad(high Dose 65+) 01/29/2022   H1N1 06/13/2008   Influenza Split 03/05/2006, 02/26/2007, 01/26/2008, 01/18/2009, 12/12/2009, 01/16/2011, 01/29/2012, 02/01/2013, 02/08/2014, 02/13/2015, 01/10/2016, 02/23/2017, 02/24/2018, 01/24/2019, 12/22/2019   Influenza, High Dose Seasonal PF 01/30/2021, 02/04/2023   Moderna Covid-19 Fall Seasonal Vaccine 21yrs & older 02/11/2023   Moderna SARS-COV2 Booster Vaccination 09/18/2021   PFIZER(Purple Top)SARS-COV-2 Vaccination 05/04/2019, 05/25/2019, 09/01/2020, 02/11/2022   Pfizer Covid-19 Vaccine Bivalent Booster 54yrs  & up 12/27/2019, 12/26/2020   Pfizer(Comirnaty)Fall Seasonal Vaccine 12 years and older 02/11/2022   Pneumococcal Conjugate-13 02/10/2014   Pneumococcal Polysaccharide-23 06/10/2011   Tdap 06/05/2022   Zoster Recombinant(Shingrix) 07/04/2009, 03/24/2022   Pertinent  Health Maintenance Due  Topic Date Due   INFLUENZA VACCINE  11/06/2023      02/23/2023    2:34 PM 03/02/2023    3:44 PM 03/12/2023   12:56 PM 07/31/2023    1:33 PM 08/11/2023   12:53 PM  Fall Risk  Falls in the past year?  0 0 0 0  Was there an injury with Fall? 1 0 0 0 0  Fall Risk Category Calculator  0 0 0 0  Patient at Risk for Falls Due to History of fall(s);Impaired balance/gait;Impaired mobility History of fall(s);Impaired balance/gait;Impaired mobility  History of fall(s);Impaired balance/gait;Impaired mobility History of fall(s);Impaired balance/gait  Fall risk Follow up Falls evaluation completed;Education provided;Falls prevention discussed Falls evaluation completed;Education provided;Falls prevention discussed Falls evaluation completed Falls evaluation completed;Education provided Falls evaluation completed;Education provided   Functional Status Survey:    Vitals:   08/24/23 0915  BP: (!) 96/55  Pulse: 67  Resp: 17  Temp: (!) 97.3 F (36.3 C)  SpO2: 97%  Weight: 164 lb 12.8 oz (74.8 kg)  Height: 5\' 10"  (1.778 m)   Body mass index is 23.65 kg/m. Physical Exam Vitals reviewed.  Constitutional:      General: He is not in acute distress. HENT:     Head: Normocephalic.  Eyes:     General:        Right eye: No discharge.        Left eye: No discharge.  Cardiovascular:     Rate and Rhythm: Normal rate and regular rhythm.     Pulses: Normal pulses.     Heart sounds: Murmur heard.  Pulmonary:     Effort: Pulmonary effort is normal. No respiratory distress.     Breath sounds: Normal breath sounds. No wheezing or rales.  Abdominal:     General: Bowel sounds are normal.     Palpations: Abdomen is  soft.  Musculoskeletal:     Cervical back: Neck supple.     Right lower leg: No edema.     Comments: Left BKA  Skin:    General: Skin is warm.     Capillary Refill: Capillary refill takes less than 2 seconds.     Comments: Stage II PU to right buttocks, CDI, foam dressing clean  Neurological:     General: No focal deficit present.     Mental Status: He is alert. Mental status is at baseline.     Motor: Weakness present.     Gait: Gait abnormal.     Comments: Rigidity to extremities, slowed speech  Psychiatric:  Mood and Affect: Mood normal.     Labs reviewed: Recent Labs    01/15/23 0000 04/14/23 0000 08/03/23 0000  NA 135* 138 136*  K 4.3 4.8 4.6  CL 99 99 98*  CO2 28* 27* 25*  BUN 25* 23* 19  CREATININE 1.0 0.9 0.8  CALCIUM  8.6* 9.0 8.4*   Recent Labs    09/05/22 0000  AST 13*  ALT 7*  ALKPHOS 73  ALBUMIN  3.8   Recent Labs    09/05/22 0000 04/14/23 0000 08/03/23 0000  WBC 6.0 6.7 7.5  NEUTROABS  --  4,576.00 5,378.00  HGB 13.4* 12.0* 9.9*  HCT 40* 36* 32*  PLT 176 314 350   Lab Results  Component Value Date   TSH 3.07 09/05/2022   Lab Results  Component Value Date   HGBA1C 6.0 09/05/2022   No results found for: "CHOL", "HDL", "LDLCALC", "LDLDIRECT", "TRIG", "CHOLHDL"  Significant Diagnostic Results in last 30 days:  No results found.  Assessment/Plan 1. Moderate protein-calorie malnutrition (HCC) (Primary) - BMI 23.65 - lost almost 20 lbs within 1 year - albumin  3.4, protein 6.7 07/09/2023 - followed by dietary - cont Boost and weekly weights  2. Dysphagia, unspecified type - suspected aspiration PNA 04/28> resolved with Augmentin  - acute cough improved after ST consults - cont chopped meats and supervised feedings  3. Parkinson's disease with fluctuating manifestations, unspecified whether dyskinesia present (HCC) - progressed - now hoyer transfer and feeding assist - recent dysphagia diagnosis - dependent with ADLs - cont  sinemet   4. Below-knee amputation of left lower extremity, sequela (HCC) - d/t osteomyelitis - cont skilled nursing  5. Lower extremity edema - cont torsemide   6. Prediabetes - A1c stable  7. Iron deficiency anemia secondary to inadequate dietary iron intake - hgb was 9. (04/28)> was 12 (01/06) - hemoccults never done - some low blood pressures - cont ferrous sulfate 3x/week - will discuss with nursing  - repeat cbc/diff 05/29    Family/ staff Communication: plan discussed with patient and nurse  Labs/tests ordered: repeat cbc/diff 05/29

## 2023-09-01 ENCOUNTER — Non-Acute Institutional Stay (SKILLED_NURSING_FACILITY): Payer: Self-pay | Admitting: Adult Health

## 2023-09-01 ENCOUNTER — Encounter: Payer: Self-pay | Admitting: Adult Health

## 2023-09-01 DIAGNOSIS — G20A2 Parkinson's disease without dyskinesia, with fluctuations: Secondary | ICD-10-CM

## 2023-09-01 DIAGNOSIS — R051 Acute cough: Secondary | ICD-10-CM

## 2023-09-01 DIAGNOSIS — I509 Heart failure, unspecified: Secondary | ICD-10-CM | POA: Diagnosis not present

## 2023-09-01 NOTE — Progress Notes (Signed)
 Location:  [facility] Room Number: N 27 A Place of Service:  SNF (31) Provider:  Medina-Vargas, Neytiri Asche, DNP, FNP-BC  Patient Care Team: Marguerite Shiley, MD as PCP - General (Internal Medicine) [name], Von Grumbling, DO as Consulting Physician (Neurology)  Extended Emergency Contact Information Primary Emergency Contact: Qualley,jane Address: [address] [place].          apt [phone] [place] Home Phone: [phone] Mobile Phone: [phone] Relation: Spouse Secondary Emergency Contact: Bartow,kenneth Home Phone: [phone] Mobile Phone: [phone] Relation: Son  Code Status:   DNR  Goals of care: Advanced Directive information    08/11/2023   10:05 AM  Advanced Directives  Does Patient Have a Medical Advance Directive? Yes  Type of Estate agent of [place];Living will  Does patient want to make changes to medical advance directive? No - Patient declined  Copy of Healthcare Power of Attorney in Chart? Yes - validated most recent copy scanned in chart (See row information)     Chief Complaint  Patient presents with   Acute Visit    cough    HPI:  Pt is a 89 y.o. male seen today for an acute visit regarding cough. He is a resident of [id number]. He has productive cough, no fever nor chills. He denies sore throat. No wheezing nor shortness of breath.   He has Parkinson's disease and takes carbidopa -levodopa  ER 50-200 mg 1 tablet at bedtime and carbidopa -levodopa  50-100 mg 4 times a day for Parkinson's disease.  He denies shortness of breath.  He takes torsemide  20 mg daily on MWF    Past Medical History:  Diagnosis Date   Acute bronchiolitis    Acute osteomyelitis of calcaneum, left (HCC) 01/11/2021   Anemia    Bursitis of both hips    Complication of anesthesia    wife states pt was a little "loopy" for several days   Dementia (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   Hearing reduced    hearing  aid   History of basal cell carcinoma    Hyperlipidemia    Malaise and fatigue    MRSA infection 01/11/2021   Osteoarthritis    hand   Osteoporosis    Palpitation    Parkinson's disease (HCC)    Peripheral vascular disease (HCC)    blood clots   Pre-diabetes    Vitamin D  deficiency    Past Surgical History:  Procedure Laterality Date   ABDOMINAL AORTOGRAM W/LOWER EXTREMITY N/A 06/12/2021   Procedure: ABDOMINAL AORTOGRAM W/LOWER EXTREMITY;  Surgeon: Wenona Hamilton, MD;  Location: [facility] INVASIVE CV LAB;  Service: Cardiovascular;  Laterality: N/A;   AMPUTATION Left 07/03/2021   Procedure: LEFT BELOW KNEE AMPUTATION;  Surgeon: Timothy Ford, MD;  Location: [facility] OR;  Service: Orthopedics;  Laterality: Left;   CATARACT EXTRACTION  05/31/2015   Eye/right   COLONOSCOPY  02/05/2005, 03/06/2005   normal, 10 years ago   MOHS micrographic surgery     Face surgery   ORIF ACETABULAR FRACTURE Right 10/09/2020   Procedure: OPEN REDUCTION INTERNAL FIXATION (ORIF) ACETABULAR FRACTURE;  Surgeon: Hardy Lia, MD;  Location: [facility] OR;  Service: Orthopedics;  Laterality: Right;    No Known Allergies  Outpatient Encounter Medications as of 09/01/2023  Medication Sig   acetaminophen  (TYLENOL ) 500 MG tablet Take 1,000 mg by mouth as needed.   acetaminophen  (TYLENOL ) 500 MG tablet Take 1,000 mg by mouth every morning.  albuterol  (VENTOLIN  HFA) 108 (90 Base) MCG/ACT inhaler Inhale 2 puffs into the lungs every 4 (four) hours as needed for wheezing or shortness of breath.   ascorbic acid  (VITAMIN C) 500 MG tablet Take 500 mg by mouth daily.   calcium  carbonate (CALCIUM  600) 600 MG TABS tablet Take 600 mg by mouth every morning.   calcium  carbonate (TUMS - DOSED IN MG ELEMENTAL CALCIUM ) 500 MG chewable tablet Chew 1 tablet by mouth every 8 (eight) hours as needed for indigestion or heartburn.   carbidopa -levodopa  (SINEMET  CR) 50-200 MG tablet Take 1 tablet by mouth at bedtime.   carbidopa -levodopa  (SINEMET  IR)  25-100 MG tablet Take 1 tablet by mouth 4 (four) times daily.   Carboxymethylcellulose Sodium (ARTIFICIAL TEARS OP) Place 1 drop into both eyes every morning.   ferrous sulfate 325 (65 FE) MG tablet Take 325 mg by mouth 2 (two) times a week. Monday & Thursday   GUAIFENESIN  PO Take 10 mLs by mouth every 6 (six) hours as needed.   ipratropium-albuterol  (DUONEB) 0.5-2.5 (3) MG/3ML SOLN Take 3 mLs by nebulization 3 (three) times daily as needed.   Multiple Vitamins-Minerals (PRESERVISION AREDS 2 PO) Take 1 capsule by mouth in the morning and at bedtime.   nystatin  (MYCOSTATIN /NYSTOP ) powder Apply 1 Application topically every 12 (twelve) hours as needed.   omeprazole  (PRILOSEC) 20 MG capsule Take 1 capsule (20 mg total) by mouth daily before breakfast.   polyethylene glycol (MIRALAX  / GLYCOLAX ) 17 g packet Take 17 g by mouth daily.   senna (SENOKOT) 8.6 MG tablet Take 1 tablet by mouth every other day.   torsemide  (DEMADEX ) 20 MG tablet Take 20 mg by mouth 3 (three) times a week.   vitamin B-12 (CYANOCOBALAMIN) 1000 MCG tablet Take 1,000 mcg by mouth daily.   Vitamin D , Cholecalciferol , 25 MCG (1000 UT) TABS Take 1,000 Units by mouth daily.   White Petrolatum-Mineral Oil (ARTIFICIAL TEARS) 83-15 % OINT Place 1 Application into both eyes at bedtime.   zinc  oxide 20 % ointment Apply 1 application. topically See admin instructions. To buttocks after every incontinent episode and as needed for redness.   No facility-administered encounter medications on file as of 09/01/2023.    Review of Systems  Constitutional:  Negative for activity change, appetite change and fever.  HENT:  Negative for sore throat.   Eyes: Negative.   Respiratory:  Positive for cough.   Cardiovascular:  Negative for chest pain and leg swelling.  Gastrointestinal:  Negative for abdominal distention, diarrhea and vomiting.  Genitourinary:  Negative for dysuria, frequency and urgency.  Skin:  Negative for color change.   Neurological:  Negative for dizziness and headaches.  Psychiatric/Behavioral:  Negative for behavioral problems and sleep disturbance. The patient is not nervous/anxious.      Immunization History  Administered Date(s) Administered   Fluad Quad(high Dose 65+) 01/29/2022   H1N1 06/13/2008   Influenza Split 03/05/2006, 02/26/2007, 01/26/2008, 01/18/2009, 12/12/2009, 01/16/2011, 01/29/2012, 02/01/2013, 02/08/2014, 02/13/2015, 01/10/2016, 02/23/2017, 02/24/2018, 01/24/2019, 12/22/2019   Influenza, High Dose Seasonal PF 01/30/2021, 02/04/2023   Moderna Covid-19 Fall Seasonal Vaccine 97yrs & older 02/11/2023   Moderna SARS-COV2 Booster Vaccination 09/18/2021   PFIZER(Purple Top)SARS-COV-2 Vaccination 05/04/2019, 05/25/2019, 09/01/2020, 02/11/2022   Pfizer Covid-19 Vaccine Bivalent Booster 48yrs & up 12/27/2019, 12/26/2020   Pfizer(Comirnaty)Fall Seasonal Vaccine 12 years and older 02/11/2022   Pneumococcal Conjugate-13 02/10/2014   Pneumococcal Polysaccharide-23 06/10/2011   Tdap 06/05/2022   Zoster Recombinant(Shingrix) 07/04/2009, 03/24/2022   Pertinent  Health Maintenance Due  Topic  Date Due   INFLUENZA VACCINE  11/06/2023      02/23/2023    2:34 PM 03/02/2023    3:44 PM 03/12/2023   12:56 PM 07/31/2023    1:33 PM 08/11/2023   12:53 PM  Fall Risk  Falls in the past year?  0 0 0 0  Was there an injury with Fall? 1 0 0 0 0  Fall Risk Category Calculator  0 0 0 0  Patient at Risk for Falls Due to History of fall(s);Impaired balance/gait;Impaired mobility History of fall(s);Impaired balance/gait;Impaired mobility  History of fall(s);Impaired balance/gait;Impaired mobility History of fall(s);Impaired balance/gait  Fall risk Follow up Falls evaluation completed;Education provided;Falls prevention discussed Falls evaluation completed;Education provided;Falls prevention discussed Falls evaluation completed Falls evaluation completed;Education provided Falls evaluation completed;Education  provided     Vitals:   09/01/23 1612  BP: 102/88  Pulse: 73  Resp: 18  Temp: 98.1 F (36.7 C)  SpO2: 98%  Weight: 165 lb 3.2 oz (74.9 kg)  Height: 5\' 10"  (1.778 m)   Body mass index is 23.7 kg/m.  Physical Exam Constitutional:      Appearance: Normal appearance.  HENT:     Head: Normocephalic and atraumatic.     Mouth/Throat:     Mouth: Mucous membranes are moist.  Eyes:     Conjunctiva/sclera: Conjunctivae normal.  Cardiovascular:     Rate and Rhythm: Normal rate and regular rhythm.     Pulses: Normal pulses.     Heart sounds: Normal heart sounds.  Pulmonary:     Effort: Pulmonary effort is normal.     Breath sounds: Normal breath sounds.  Abdominal:     General: Bowel sounds are normal.     Palpations: Abdomen is soft.  Musculoskeletal:        General: No swelling.     Cervical back: Normal range of motion.     Comments: Left BKA  Skin:    General: Skin is warm and dry.  Neurological:     Mental Status: He is alert.  Psychiatric:        Mood and Affect: Mood normal.        Behavior: Behavior normal.        Labs reviewed: Recent Labs    01/15/23 0000 04/14/23 0000 08/03/23 0000  NA 135* 138 136*  K 4.3 4.8 4.6  CL 99 99 98*  CO2 28* 27* 25*  BUN 25* 23* 19  CREATININE 1.0 0.9 0.8  CALCIUM  8.6* 9.0 8.4*   Recent Labs    09/05/22 0000  AST 13*  ALT 7*  ALKPHOS 73  ALBUMIN  3.8   Recent Labs    09/05/22 0000 04/14/23 0000 08/03/23 0000  WBC 6.0 6.7 7.5  NEUTROABS  --  4,576.00 5,378.00  HGB 13.4* 12.0* 9.9*  HCT 40* 36* 32*  PLT 176 314 350   Lab Results  Component Value Date   TSH 3.07 09/05/2022   Lab Results  Component Value Date   HGBA1C 6.0 09/05/2022   No results found for: "CHOL", "HDL", "LDLCALC", "LDLDIRECT", "TRIG", "CHOLHDL"  Significant Diagnostic Results in last 30 days:  No results found.  Assessment/Plan  1. Acute cough (Primary) -   Will start guaifenesin  100 mg / 5 mL give 10 mL p.o. 3 times a day x 5  days  2. Parkinson's disease with fluctuating manifestations, unspecified whether dyskinesia present (HCC) -Stable -   Continue  carbidopa -levodopa  ER 50-200 mg 1 tablet at bedtime and carbidopa -levodopa  50-100 mg 4 times -  fall precautions  3. Chronic congestive heart failure, unspecified heart failure type (HCC) -  no shortness of breath - Continue torsemide  20 mg daily on MWF      Family/ staff Communication: Discussed plan of care with resident and charge nurse.  Labs/tests ordered:   None    Dontrey Snellgrove Medina-Vargas, DNP, MSN, FNP-BC [facility] [phone] (Monday-Friday 8:00 a.m. - 5:00 p.m.) [phone] (after hours)

## 2023-09-03 LAB — CBC AND DIFFERENTIAL
HCT: 35 — AB (ref 41–53)
Hemoglobin: 11 — AB (ref 13.5–17.5)
Neutrophils Absolute: 5288
Platelets: 311 10*3/uL (ref 150–400)
WBC: 8

## 2023-09-03 LAB — CBC: RBC: 4.04 (ref 3.87–5.11)

## 2023-09-08 ENCOUNTER — Encounter: Payer: Self-pay | Admitting: Orthopedic Surgery

## 2023-09-08 ENCOUNTER — Non-Acute Institutional Stay (SKILLED_NURSING_FACILITY): Payer: Self-pay | Admitting: Orthopedic Surgery

## 2023-09-08 DIAGNOSIS — Z7189 Other specified counseling: Secondary | ICD-10-CM

## 2023-09-08 DIAGNOSIS — G20A2 Parkinson's disease without dyskinesia, with fluctuations: Secondary | ICD-10-CM

## 2023-09-08 DIAGNOSIS — R1310 Dysphagia, unspecified: Secondary | ICD-10-CM | POA: Diagnosis not present

## 2023-09-08 DIAGNOSIS — R634 Abnormal weight loss: Secondary | ICD-10-CM

## 2023-09-08 NOTE — Progress Notes (Signed)
 Location:  [facility] Room Number: N27-A Place of Service:  SNF [phone]) Provider:  Rupert Counts, MD  Patient Care Team: Marguerite Shiley, MD as PCP - General (Internal Medicine) [name], Von Grumbling, DO as Consulting Physician (Neurology)  Extended Emergency Contact Information Primary Emergency Contact: Seville,jane Address: [address] [place].          apt [phone] [place] Home Phone: [phone] Mobile Phone: [phone] Relation: Spouse Secondary Emergency Contact: Rehm,kenneth Home Phone: [phone] Mobile Phone: [phone] Relation: Son  Code Status:  DNR Goals of care: Advanced Directive information    09/08/2023    9:11 AM  Advanced Directives  Does Patient Have a Medical Advance Directive? Yes  Type of Estate agent of [place];Living will;Out of facility DNR (pink MOST or yellow form)  Does patient want to make changes to medical advance directive? No - Patient declined  Copy of Healthcare Power of Attorney in Chart? Yes - validated most recent copy scanned in chart (See row information)  Pre-existing out of facility DNR order (yellow form or pink MOST form) Pink MOST/Yellow Form most recent copy in chart - Physician notified to receive inpatient order     Chief Complaint  Patient presents with   Weight Loss    HPI:  Pt is a 89 y.o. male seen today for an acute visit for due to weight loss.   He currently resides on the skilled nursing unit at [facility]. PMH: HTN, HLD, HFpEF, parkinson's, PBA, acute DVT right lower leg 12/27/2020, osteoporosis (past Fosamax use), BPH, CKD, ORIF right acetabular 10/2020, PNA LLL 12/29/2022, and unstable gait.   Overall decline in health since 03/2023. Appears parkinson's has worsened. Progressive weight loss has also occurred. 03/08/2023 weight 186.5 lbs. 06/10/2023 weight 177.4 lbs. 09/07/2023 weight 166.1 lbs. He is a Nurse, adult for  transfers. Unable to ambulate well with left lower leg prosthesis. He is sleeping more during the day. Responsiveness during conversations has also slowed. 04/25 concerns for aspiration PNA. ST diagnosed him with dysphagia. He is now requiring feeding assist with meals. Goals of care discussed with patient and wife today. He is a DNR and MOST form completed> no future hospitalizations. We discussed palliative care versus hospice services today. They agree for hospice consult.    Past Medical History:  Diagnosis Date   Acute bronchiolitis    Acute osteomyelitis of calcaneum, left (HCC) 01/11/2021   Anemia    Bursitis of both hips    Complication of anesthesia    wife states pt was a little "loopy" for several days   Dementia (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   Hearing reduced    hearing aid   History of basal cell carcinoma    Hyperlipidemia    Malaise and fatigue    MRSA infection 01/11/2021   Osteoarthritis    hand   Osteoporosis    Palpitation    Parkinson's disease (HCC)    Peripheral vascular disease (HCC)    blood clots   Pre-diabetes    Vitamin D  deficiency    Past Surgical History:  Procedure Laterality Date   ABDOMINAL AORTOGRAM W/LOWER EXTREMITY N/A 06/12/2021   Procedure: ABDOMINAL AORTOGRAM W/LOWER EXTREMITY;  Surgeon: Wenona Hamilton, MD;  Location: [facility] INVASIVE CV LAB;  Service: Cardiovascular;  Laterality: N/A;   AMPUTATION Left 07/03/2021   Procedure: LEFT BELOW KNEE AMPUTATION;  Surgeon: Timothy Ford, MD;  Location: [facility]  OR;  Service: Orthopedics;  Laterality: Left;   CATARACT EXTRACTION  05/31/2015   Eye/right   COLONOSCOPY  02/05/2005, 03/06/2005   normal, 10 years ago   MOHS micrographic surgery     Face surgery   ORIF ACETABULAR FRACTURE Right 10/09/2020   Procedure: OPEN REDUCTION INTERNAL FIXATION (ORIF) ACETABULAR FRACTURE;  Surgeon: Hardy Lia, MD;  Location: [facility] OR;  Service: Orthopedics;  Laterality: Right;    No Known Allergies  Outpatient Encounter  Medications as of 09/08/2023  Medication Sig   acetaminophen  (TYLENOL ) 500 MG tablet Take 1,000 mg by mouth 3 (three) times daily as needed.   acetaminophen  (TYLENOL ) 500 MG tablet Take 1,000 mg by mouth every morning.   albuterol  (VENTOLIN  HFA) 108 (90 Base) MCG/ACT inhaler Inhale 2 puffs into the lungs every 4 (four) hours as needed for wheezing or shortness of breath.   ascorbic acid  (VITAMIN C) 500 MG tablet Take 500 mg by mouth daily.   calcium  carbonate (CALCIUM  600) 600 MG TABS tablet Take 600 mg by mouth every morning.   calcium  carbonate (TUMS - DOSED IN MG ELEMENTAL CALCIUM ) 500 MG chewable tablet Chew 1 tablet by mouth every 8 (eight) hours as needed for indigestion or heartburn.   carbidopa -levodopa  (SINEMET  CR) 50-200 MG tablet Take 1 tablet by mouth at bedtime.   carbidopa -levodopa  (SINEMET  IR) 25-100 MG tablet Take 1 tablet by mouth 4 (four) times daily.   Carboxymethylcellulose Sodium (ARTIFICIAL TEARS OP) Place 1 drop into both eyes 4 (four) times daily.   ferrous sulfate 325 (65 FE) MG tablet Take 325 mg by mouth 3 (three) times a week. Monday & Thursday& Saturday   ipratropium-albuterol  (DUONEB) 0.5-2.5 (3) MG/3ML SOLN Inhale 3 mLs into the lungs every 8 (eight) hours.   lactose free nutrition (BOOST) LIQD Take 240 mLs by mouth 2 (two) times daily.   Multiple Vitamins-Minerals (PRESERVISION AREDS 2 PO) Take 1 capsule by mouth in the morning and at bedtime.   nystatin  (MYCOSTATIN /NYSTOP ) powder Apply 1 Application topically every 12 (twelve) hours as needed.   omeprazole  (PRILOSEC) 20 MG capsule Take 1 capsule (20 mg total) by mouth daily before breakfast.   polyethylene glycol (MIRALAX  / GLYCOLAX ) 17 g packet Take 17 g by mouth daily.   senna (SENOKOT) 8.6 MG tablet Take 1 tablet by mouth every other day.   torsemide  (DEMADEX ) 20 MG tablet Take 20 mg by mouth 3 (three) times a week.   vitamin B-12 (CYANOCOBALAMIN) 1000 MCG tablet Take 1,000 mcg by mouth daily.   Vitamin D ,  Cholecalciferol , 25 MCG (1000 UT) TABS Take 1,000 Units by mouth daily.   White Petrolatum-Mineral Oil (ARTIFICIAL TEARS) 83-15 % OINT Place 1 Application into both eyes at bedtime.   zinc  oxide 20 % ointment Apply 1 application. topically See admin instructions. To buttocks after every incontinent episode and as needed for redness.   GUAIFENESIN  PO Take 10 mLs by mouth every 6 (six) hours as needed. (Patient not taking: Reported on 09/08/2023)   ipratropium-albuterol  (DUONEB) 0.5-2.5 (3) MG/3ML SOLN Take 3 mLs by nebulization 3 (three) times daily as needed. (Patient not taking: Reported on 09/08/2023)   No facility-administered encounter medications on file as of 09/08/2023.    Review of Systems  Unable to perform ROS: Acuity of condition    Immunization History  Administered Date(s) Administered   Fluad Quad(high Dose 65+) 01/29/2022   H1N1 06/13/2008   Influenza Split 03/05/2006, 02/26/2007, 01/26/2008, 01/18/2009, 12/12/2009, 01/16/2011, 01/29/2012, 02/01/2013, 02/08/2014, 02/13/2015, 01/10/2016, 02/23/2017, 02/24/2018, 01/24/2019, 12/22/2019  Influenza, High Dose Seasonal PF 01/30/2021, 02/04/2023   Moderna Covid-19 Fall Seasonal Vaccine 81yrs & older 02/11/2023   Moderna SARS-COV2 Booster Vaccination 09/18/2021   PFIZER(Purple Top)SARS-COV-2 Vaccination 05/04/2019, 05/25/2019, 09/01/2020, 02/11/2022   Pfizer Covid-19 Vaccine Bivalent Booster 36yrs & up 12/27/2019, 12/26/2020   Pfizer(Comirnaty)Fall Seasonal Vaccine 12 years and older 02/11/2022   Pneumococcal Conjugate-13 02/10/2014   Pneumococcal Polysaccharide-23 06/10/2011   Tdap 06/05/2022   Zoster Recombinant(Shingrix) 07/04/2009, 03/24/2022   Pertinent  Health Maintenance Due  Topic Date Due   INFLUENZA VACCINE  11/06/2023      02/23/2023    2:34 PM 03/02/2023    3:44 PM 03/12/2023   12:56 PM 07/31/2023    1:33 PM 08/11/2023   12:53 PM  Fall Risk  Falls in the past year?  0 0 0 0  Was there an injury with Fall? 1 0 0 0 0   Fall Risk Category Calculator  0 0 0 0  Patient at Risk for Falls Due to History of fall(s);Impaired balance/gait;Impaired mobility History of fall(s);Impaired balance/gait;Impaired mobility  History of fall(s);Impaired balance/gait;Impaired mobility History of fall(s);Impaired balance/gait  Fall risk Follow up Falls evaluation completed;Education provided;Falls prevention discussed Falls evaluation completed;Education provided;Falls prevention discussed Falls evaluation completed Falls evaluation completed;Education provided Falls evaluation completed;Education provided   Functional Status Survey:    Vitals:   09/08/23 0909  BP: 133/77  Pulse: 81  Resp: 16  Temp: 98.7 F (37.1 C)  SpO2: 96%  Weight: 166 lb 1.6 oz (75.3 kg)  Height: 5\' 10"  (1.778 m)   Body mass index is 23.83 kg/m. Physical Exam Vitals reviewed.  Constitutional:      General: He is not in acute distress. HENT:     Head: Normocephalic.  Eyes:     General:        Right eye: No discharge.        Left eye: No discharge.  Cardiovascular:     Rate and Rhythm: Normal rate and regular rhythm.     Pulses: Normal pulses.     Heart sounds: Murmur heard.  Pulmonary:     Effort: Pulmonary effort is normal. No respiratory distress.     Breath sounds: Normal breath sounds. No wheezing or rales.  Abdominal:     General: Bowel sounds are normal. There is no distension.     Palpations: Abdomen is soft.     Tenderness: There is no abdominal tenderness.  Musculoskeletal:     Cervical back: Neck supple.     Right lower leg: No edema.     Comments: Left BKA  Skin:    General: Skin is warm.     Capillary Refill: Capillary refill takes less than 2 seconds.  Neurological:     General: No focal deficit present.     Mental Status: He is alert. Mental status is at baseline.     Motor: Weakness present.     Gait: Gait abnormal.     Comments: Rigidity to extremities  Psychiatric:        Mood and Affect: Affect is flat.         Speech: Speech is delayed.        Behavior: Behavior is slowed.     Labs reviewed: Recent Labs    01/15/23 0000 04/14/23 0000 08/03/23 0000  NA 135* 138 136*  K 4.3 4.8 4.6  CL 99 99 98*  CO2 28* 27* 25*  BUN 25* 23* 19  CREATININE 1.0 0.9 0.8  CALCIUM  8.6* 9.0 8.4*  No results for input(s): "AST", "ALT", "ALKPHOS", "BILITOT", "PROT", "ALBUMIN " in the last 8760 hours. Recent Labs    04/14/23 0000 08/03/23 0000 09/03/23 0000  WBC 6.7 7.5 8.0  NEUTROABS 4,576.00 5,378.00 5,288.00  HGB 12.0* 9.9* 11.0*  HCT 36* 32* 35*  PLT 314 350 311   Lab Results  Component Value Date   TSH 3.07 09/05/2022   Lab Results  Component Value Date   HGBA1C 6.0 09/05/2022   No results found for: "CHOL", "HDL", "LDLCALC", "LDLDIRECT", "TRIG", "CHOLHDL"  Significant Diagnostic Results in last 30 days:  No results found.  Assessment/Plan: 1. Advanced directives, counseling/discussion (Primary) - 20 lbs weight loss within 6 months - dependent with all ADLs including feeding assist - he is a DER/ MOST form completed  - hospice referral   2. Weight loss - see above - BMI 23.83 - followed by dietary - magic cups started - cont Boost - cont weekly weights x 4  3. Parkinson's disease with fluctuating manifestations, unspecified whether dyskinesia present (HCC) - late stages - hoyer transfer - slowed speech - progressive weight loss - cont sinemet   4. Dysphagia, unspecified type - 04/25 suspicion for aspiration PNA - followed by ST  - cont mechanical soft diet    Family/ staff Communication: plan discussed with patient and nurse  Labs/tests ordered:  hospice consult

## 2023-09-23 ENCOUNTER — Encounter: Payer: Self-pay | Admitting: Orthopedic Surgery

## 2023-09-23 ENCOUNTER — Non-Acute Institutional Stay (SKILLED_NURSING_FACILITY): Payer: Self-pay | Admitting: Orthopedic Surgery

## 2023-09-23 DIAGNOSIS — R1310 Dysphagia, unspecified: Secondary | ICD-10-CM | POA: Diagnosis not present

## 2023-09-23 DIAGNOSIS — R7303 Prediabetes: Secondary | ICD-10-CM

## 2023-09-23 DIAGNOSIS — R600 Localized edema: Secondary | ICD-10-CM

## 2023-09-23 DIAGNOSIS — E440 Moderate protein-calorie malnutrition: Secondary | ICD-10-CM

## 2023-09-23 DIAGNOSIS — D508 Other iron deficiency anemias: Secondary | ICD-10-CM

## 2023-09-23 DIAGNOSIS — G20A2 Parkinson's disease without dyskinesia, with fluctuations: Secondary | ICD-10-CM

## 2023-09-23 DIAGNOSIS — Z89512 Acquired absence of left leg below knee: Secondary | ICD-10-CM

## 2023-09-23 NOTE — Progress Notes (Signed)
 Location:  [facility] Room Number: N27-A Place of Service:  SNF (31) Provider:  Ulyses Gandy, NP    Patient Care Team: Marguerite Shiley, MD as PCP - General (Internal Medicine) [name], Von Grumbling, DO as Consulting Physician (Neurology)  Extended Emergency Contact Information Primary Emergency Contact: Brumett,jane Address: [address] [place].          apt [phone] [place] Home Phone: [phone] Mobile Phone: [phone] Relation: Spouse Secondary Emergency Contact: Rosengren,kenneth Home Phone: [phone] Mobile Phone: [phone] Relation: Son  Code Status:  DNR Hospice Goals of care: Advanced Directive information    09/08/2023    9:11 AM  Advanced Directives  Does Patient Have a Medical Advance Directive? Yes  Type of Estate agent of [place];Living will;Out of facility DNR (pink MOST or yellow form)  Does patient want to make changes to medical advance directive? No - Patient declined  Copy of Healthcare Power of Attorney in Chart? Yes - validated most recent copy scanned in chart (See row information)  Pre-existing out of facility DNR order (yellow form or pink MOST form) Pink MOST/Yellow Form most recent copy in chart - Physician notified to receive inpatient order     Chief Complaint  Patient presents with   Routine visit    HPI:  Pt is a 89 y.o. male seen today for medical management of chronic diseases.    He currently resides on the skilled nursing unit at [facility]. PMH: HTN, HLD, HFpEF, parkinson's, PBA, acute DVT right lower leg 12/27/2020, osteoporosis (past Fosamax use), BPH, CKD, ORIF right acetabular 10/2020, PNA LLL 12/29/2022, and unstable gait.    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 Julio Ohm due to ongoing osteomyelitis (+MRSA) of left heel, not using prosthetic at this time Edema- LV EF 60-65% 02/20/2021, improved since amputation, compression hose to right leg daily, torsemide  reduced to 3x/week 05/30 Prediabetes-  A1c 6.0 (05/31)> was 5.8 01/16/2022 Iron deficiency anemia- hgb 11.0 (05/29)> was 9.9 (04/28)> was 12.0 (01/06), 04/25 hemoccults ordered> not completed, remains on ferrous sulfate M/W/F  Wife and patient pleased with hospice services.   No recent falls.   No significant weight changes.   Recent weights:  06/11- 165.1 lbs  06/01- 166.1 lbs  05/01- 164.8 lbs  04/01- 170.1 lbs  03/01- 177.4 lbs  Past Medical History:  Diagnosis Date   Acute bronchiolitis    Acute osteomyelitis of calcaneum, left (HCC) 01/11/2021   Anemia    Bursitis of both hips    Complication of anesthesia    wife states pt was a little loopy for several days   Dementia (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   Hearing reduced    hearing aid   History of basal cell carcinoma    Hyperlipidemia    Malaise and fatigue    MRSA infection 01/11/2021   Osteoarthritis    hand   Osteoporosis    Palpitation    Parkinson's disease (HCC)    Peripheral vascular disease (HCC)    blood clots   Pre-diabetes    Vitamin D  deficiency  Past Surgical History:  Procedure Laterality Date   ABDOMINAL AORTOGRAM W/LOWER EXTREMITY N/A 06/12/2021   Procedure: ABDOMINAL AORTOGRAM W/LOWER EXTREMITY;  Surgeon: Wenona Hamilton, MD;  Location: [facility] INVASIVE CV LAB;  Service: Cardiovascular;  Laterality: N/A;   AMPUTATION Left 07/03/2021   Procedure: LEFT BELOW KNEE AMPUTATION;  Surgeon: Timothy Ford, MD;  Location: [facility] OR;  Service: Orthopedics;  Laterality: Left;   CATARACT EXTRACTION  05/31/2015   Eye/right    COLONOSCOPY  02/05/2005, 03/06/2005   normal, 10 years ago   MOHS micrographic surgery     Face surgery   ORIF ACETABULAR FRACTURE Right 10/09/2020   Procedure: OPEN REDUCTION INTERNAL FIXATION (ORIF) ACETABULAR FRACTURE;  Surgeon: Hardy Lia, MD;  Location: [facility] OR;  Service: Orthopedics;  Laterality: Right;    No Known Allergies  Outpatient Encounter Medications as of 09/23/2023  Medication Sig   acetaminophen  (TYLENOL ) 500 MG tablet Take 1,000 mg by mouth 3 (three) times daily as needed.   acetaminophen  (TYLENOL ) 500 MG tablet Take 1,000 mg by mouth every morning.   albuterol  (VENTOLIN  HFA) 108 (90 Base) MCG/ACT inhaler Inhale 2 puffs into the lungs every 4 (four) hours as needed for wheezing or shortness of breath.   ascorbic acid  (VITAMIN C) 500 MG tablet Take 500 mg by mouth daily.   calcium  carbonate (CALCIUM  600) 600 MG TABS tablet Take 600 mg by mouth every morning.   calcium  carbonate (TUMS - DOSED IN MG ELEMENTAL CALCIUM ) 500 MG chewable tablet Chew 1 tablet by mouth every 8 (eight) hours as needed for indigestion or heartburn.   carbidopa -levodopa  (SINEMET  CR) 50-200 MG tablet Take 1 tablet by mouth at bedtime.   carbidopa -levodopa  (SINEMET  IR) 25-100 MG tablet Take 1 tablet by mouth 4 (four) times daily.   Carboxymethylcellulose Sodium (ARTIFICIAL TEARS OP) Place 1 drop into both eyes 4 (four) times daily.   ferrous sulfate 325 (65 FE) MG tablet Take 325 mg by mouth 3 (three) times a week. Monday & Thursday& Saturday   GUAIFENESIN  PO Take 10 mLs by mouth every 6 (six) hours as needed. (Patient not taking: Reported on 09/08/2023)   ipratropium-albuterol  (DUONEB) 0.5-2.5 (3) MG/3ML SOLN Take 3 mLs by nebulization 3 (three) times daily as needed. (Patient not taking: Reported on 09/08/2023)   ipratropium-albuterol  (DUONEB) 0.5-2.5 (3) MG/3ML SOLN Inhale 3 mLs into the lungs every 8 (eight) hours.   lactose free nutrition (BOOST) LIQD Take 240 mLs by mouth 2 (two) times daily.   Multiple  Vitamins-Minerals (PRESERVISION AREDS 2 PO) Take 1 capsule by mouth in the morning and at bedtime.   nystatin  (MYCOSTATIN /NYSTOP ) powder Apply 1 Application topically every 12 (twelve) hours as needed.   omeprazole  (PRILOSEC) 20 MG capsule Take 1 capsule (20 mg total) by mouth daily before breakfast.   polyethylene glycol (MIRALAX  / GLYCOLAX ) 17 g packet Take 17 g by mouth daily.   senna (SENOKOT) 8.6 MG tablet Take 1 tablet by mouth every other day.   torsemide  (DEMADEX ) 20 MG tablet Take 20 mg by mouth 3 (three) times a week.   vitamin B-12 (CYANOCOBALAMIN) 1000 MCG tablet Take 1,000 mcg by mouth daily.   Vitamin D , Cholecalciferol , 25 MCG (1000 UT) TABS Take 1,000 Units by mouth daily.   White Petrolatum-Mineral Oil (ARTIFICIAL TEARS) 83-15 % OINT Place 1 Application into both eyes at bedtime.   zinc  oxide 20 % ointment Apply 1 application. topically See admin instructions. To buttocks after every incontinent episode and as needed for redness.   No  facility-administered encounter medications on file as of 09/23/2023.    Review of Systems  Unable to perform ROS: Age    Immunization History  Administered Date(s) Administered   Fluad Quad(high Dose 65+) 01/29/2022   H1N1 06/13/2008   Influenza Split 03/05/2006, 02/26/2007, 01/26/2008, 01/18/2009, 12/12/2009, 01/16/2011, 01/29/2012, 02/01/2013, 02/08/2014, 02/13/2015, 01/10/2016, 02/23/2017, 02/24/2018, 01/24/2019, 12/22/2019   Influenza, High Dose Seasonal PF 01/30/2021, 02/04/2023   Moderna Covid-19 Fall Seasonal Vaccine 88yrs & older 02/11/2023   Moderna SARS-COV2 Booster Vaccination 09/18/2021   PFIZER(Purple Top)SARS-COV-2 Vaccination 05/04/2019, 05/25/2019, 09/01/2020, 02/11/2022   Pfizer Covid-19 Vaccine Bivalent Booster 20yrs & up 12/27/2019, 12/26/2020   Pfizer(Comirnaty)Fall Seasonal Vaccine 12 years and older 02/11/2022   Pneumococcal Conjugate-13 02/10/2014   Pneumococcal Polysaccharide-23 06/10/2011   Tdap 06/05/2022    Zoster Recombinant(Shingrix) 07/04/2009, 03/24/2022   Pertinent  Health Maintenance Due  Topic Date Due   INFLUENZA VACCINE  11/06/2023      02/23/2023    2:34 PM 03/02/2023    3:44 PM 03/12/2023   12:56 PM 07/31/2023    1:33 PM 08/11/2023   12:53 PM  Fall Risk  Falls in the past year?  0 0 0 0  Was there an injury with Fall? 1 0 0 0 0  Fall Risk Category Calculator  0 0 0 0  Patient at Risk for Falls Due to History of fall(s);Impaired balance/gait;Impaired mobility History of fall(s);Impaired balance/gait;Impaired mobility  History of fall(s);Impaired balance/gait;Impaired mobility History of fall(s);Impaired balance/gait  Fall risk Follow up Falls evaluation completed;Education provided;Falls prevention discussed Falls evaluation completed;Education provided;Falls prevention discussed Falls evaluation completed Falls evaluation completed;Education provided Falls evaluation completed;Education provided   Functional Status Survey:    Vitals:   09/23/23 0942  BP: 126/74  Pulse: 76  Resp: 18  Temp: 97.9 F (36.6 C)  SpO2: 96%  Weight: 165 lb 1.6 oz (74.9 kg)  Height: 5' 10 (1.778 m)   Body mass index is 23.69 kg/m. Physical Exam Vitals reviewed.  Constitutional:      General: He is not in acute distress. HENT:     Head: Normocephalic.   Eyes:     General:        Right eye: No discharge.        Left eye: No discharge.    Cardiovascular:     Rate and Rhythm: Normal rate and regular rhythm.     Pulses: Normal pulses.     Heart sounds: Murmur heard.  Pulmonary:     Effort: Pulmonary effort is normal. No respiratory distress.     Breath sounds: Normal breath sounds. No wheezing or rales.  Abdominal:     General: There is no distension.     Palpations: Abdomen is soft.     Tenderness: There is no abdominal tenderness.   Musculoskeletal:     Cervical back: Neck supple.     Right lower leg: No edema.     Comments: Left BKA   Skin:    General: Skin is warm.      Capillary Refill: Capillary refill takes less than 2 seconds.     Comments: Skin tear to left dorsal hand no sign of infection, almost healed   Neurological:     General: No focal deficit present.     Mental Status: He is alert. Mental status is at baseline.     Motor: Weakness present.     Gait: Gait abnormal.     Comments: Rigidity, slowed speech  Psychiatric:  Mood and Affect: Mood normal.     Labs reviewed: Recent Labs    01/15/23 0000 04/14/23 0000 08/03/23 0000  NA 135* 138 136*  K 4.3 4.8 4.6  CL 99 99 98*  CO2 28* 27* 25*  BUN 25* 23* 19  CREATININE 1.0 0.9 0.8  CALCIUM  8.6* 9.0 8.4*   No results for input(s): AST, ALT, ALKPHOS, BILITOT, PROT, ALBUMIN  in the last 8760 hours. Recent Labs    04/14/23 0000 08/03/23 0000 09/03/23 0000  WBC 6.7 7.5 8.0  NEUTROABS 4,576.00 5,378.00 5,288.00  HGB 12.0* 9.9* 11.0*  HCT 36* 32* 35*  PLT 314 350 311   Lab Results  Component Value Date   TSH 3.07 09/05/2022   Lab Results  Component Value Date   HGBA1C 6.0 09/05/2022   No results found for: CHOL, HDL, LDLCALC, LDLDIRECT, TRIG, CHOLHDL  Significant Diagnostic Results in last 30 days:  No results found.  Assessment/Plan 1. Moderate protein-calorie malnutrition (HCC) (Primary) - BMI 23.69 - followed by dietary - weight stable x 1 month - cont Boost and magic cups  - cont monthly weights  2. Parkinson's disease with fluctuating manifestations, unspecified whether dyskinesia present (HCC) - 06/10 enrolled in hospice - rigidity and slowed speech on exam - see above - dependent with ADLs - cont sinemet   3. Dysphagia, unspecified type - no recent aspirations - associated with advancing parkinson's - cont chopped meats, mechanical soft diet  4. S/P below knee amputation, left (HCC) - cont skilled nursing  5. Edema of right lower leg - stable with torsemide  3x/week  6. Prediabetes - A1c stable - diet controlled  7.  Iron deficiency anemia secondary to inadequate dietary iron intake - hgb improved to 11 09/03/2023 - cont ferrous sulfate 3x/week   Family/ staff Communication: plan discussed with patient and nurse  Labs/tests ordered:  none

## 2023-10-23 ENCOUNTER — Non-Acute Institutional Stay (SKILLED_NURSING_FACILITY): Payer: Self-pay | Admitting: Orthopedic Surgery

## 2023-10-23 ENCOUNTER — Encounter: Payer: Self-pay | Admitting: Orthopedic Surgery

## 2023-10-23 DIAGNOSIS — L89312 Pressure ulcer of right buttock, stage 2: Secondary | ICD-10-CM | POA: Diagnosis not present

## 2023-10-23 DIAGNOSIS — L89151 Pressure ulcer of sacral region, stage 1: Secondary | ICD-10-CM

## 2023-10-23 DIAGNOSIS — E440 Moderate protein-calorie malnutrition: Secondary | ICD-10-CM

## 2023-10-23 DIAGNOSIS — L89152 Pressure ulcer of sacral region, stage 2: Secondary | ICD-10-CM | POA: Diagnosis not present

## 2023-10-23 NOTE — Progress Notes (Signed)
 Location:  [facility] Room Number: N27-A Place of Service:  SNF [phone]) Provider:  Gil Greig Greg CARROLYN Charlanne Fredia LITTIE, MD  Patient Care Team: Charlanne Fredia LITTIE, MD as PCP - General (Internal Medicine) [name], Asberry RAMAN, DO as Consulting Physician (Neurology)  Extended Emergency Contact Information Primary Emergency Contact: Solimine,jane Address: [address] [place].          apt [phone] [place] Home Phone: [phone] Mobile Phone: [phone] Relation: Spouse Secondary Emergency Contact: Mom,kenneth Home Phone: [phone] Mobile Phone: [phone] Relation: Son  Code Status:  DNR Goals of care: Advanced Directive information    10/23/2023   12:54 PM  Advanced Directives  Does Patient Have a Medical Advance Directive? Yes  Type of Estate agent of [place];Living will;Out of facility DNR (pink MOST or yellow form)  Does patient want to make changes to medical advance directive? No - Patient declined  Copy of Healthcare Power of Attorney in Chart? Yes - validated most recent copy scanned in chart (See row information)  Pre-existing out of facility DNR order (yellow form or pink MOST form) Pink MOST/Yellow Form most recent copy in chart - Physician notified to receive inpatient order     Chief Complaint  Patient presents with   Acute Visit    Pressure injury to sacrum.    HPI:  Pt is a 89 y.o. male seen today for acute visit due to pressure injury to sacrum.   He currently resides on the skilled nursing unit at [facility]. PMH: HTN, HLD, HFpEF, parkinson's, PBA, acute DVT right lower leg 12/27/2020, osteoporosis (past Fosamax use), BPH, CKD, ORIF right acetabular 10/2020, PNA LLL 12/29/2022, and unstable gait.   06/10 enrolled in hospice due to late stage parkinson's and weight loss. He is hoyer transfer at this time. Since decline in mobility he has developed pressure injuries to sacrum and right  buttocks. Nursing has been applying wound care and foam dressing changes. Existing stage I to right buttock has now advanced to stage II. He also has a new stage II to sacrum. Stage I to sacrum unchanged. He is on Prostat and magic mouth cups. Afebrile. Vitals stable.    Existing Stg 1 PU to right buttock noted to have deteriorated to Stg 2 PU. New Stg 2 PU noted to sacrum, 0.8 x 0.4 cm. New Stg 1 PU noted to sacrum, inferior to new Stg 2 PU, 0.4 x 0.2.   Past Medical History:  Diagnosis Date   Acute bronchiolitis    Acute osteomyelitis of calcaneum, left (HCC) 01/11/2021   Anemia    Bursitis of both hips    Complication of anesthesia    wife states pt was a little loopy for several days   Dementia (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   Hearing reduced    hearing aid   History of basal cell carcinoma    Hyperlipidemia    Malaise and fatigue    MRSA infection 01/11/2021   Osteoarthritis    hand   Osteoporosis    Palpitation    Parkinson's disease (HCC)    Peripheral vascular disease (HCC)    blood clots   Pre-diabetes    Vitamin D  deficiency    Past Surgical History:  Procedure Laterality Date   ABDOMINAL AORTOGRAM W/LOWER EXTREMITY N/A 06/12/2021   Procedure: ABDOMINAL AORTOGRAM W/LOWER EXTREMITY;  Surgeon: Darron Deatrice LABOR, MD;  [facility]: [facility] INVASIVE CV LAB;  Service: Cardiovascular;  Laterality:  N/A;   AMPUTATION Left 07/03/2021   Procedure: LEFT BELOW KNEE AMPUTATION;  Surgeon: Harden Jerona GAILS, MD;  Location: [facility] OR;  Service: Orthopedics;  Laterality: Left;   CATARACT EXTRACTION  05/31/2015   Eye/right   COLONOSCOPY  02/05/2005, 03/06/2005   normal, 10 years ago   MOHS micrographic surgery     Face surgery   ORIF ACETABULAR FRACTURE Right 10/09/2020   Procedure: OPEN REDUCTION INTERNAL FIXATION (ORIF) ACETABULAR FRACTURE;  Surgeon: Celena Sharper, MD;  Location: [facility] OR;  Service: Orthopedics;  Laterality: Right;    No Known Allergies  Outpatient Encounter Medications as of  10/23/2023  Medication Sig   acetaminophen  (TYLENOL ) 500 MG tablet Take 1,000 mg by mouth 3 (three) times daily as needed.   acetaminophen  (TYLENOL ) 500 MG tablet Take 1,000 mg by mouth every morning.   albuterol  (VENTOLIN  HFA) 108 (90 Base) MCG/ACT inhaler Inhale 2 puffs into the lungs every 4 (four) hours as needed for wheezing or shortness of breath.   calcium  carbonate (TUMS - DOSED IN MG ELEMENTAL CALCIUM ) 500 MG chewable tablet Chew 1 tablet by mouth every 8 (eight) hours as needed for indigestion or heartburn.   carbidopa -levodopa  (SINEMET  CR) 50-200 MG tablet Take 1 tablet by mouth at bedtime.   carbidopa -levodopa  (SINEMET  IR) 25-100 MG tablet Take 1 tablet by mouth 4 (four) times daily.   Carboxymethylcellulose Sodium (ARTIFICIAL TEARS OP) Place 1 drop into both eyes 4 (four) times daily.   ipratropium-albuterol  (DUONEB) 0.5-2.5 (3) MG/3ML SOLN Inhale 3 mLs into the lungs every 8 (eight) hours.   lactose free nutrition (BOOST) LIQD Take 240 mLs by mouth 2 (two) times daily.   nystatin  (MYCOSTATIN /NYSTOP ) powder Apply 1 Application topically every 12 (twelve) hours as needed.   omeprazole  (PRILOSEC) 20 MG capsule Take 1 capsule (20 mg total) by mouth daily before breakfast.   polyethylene glycol (MIRALAX  / GLYCOLAX ) 17 g packet Take 17 g by mouth daily.   senna (SENOKOT) 8.6 MG tablet Take 1 tablet by mouth every other day.   torsemide  (DEMADEX ) 20 MG tablet Take 20 mg by mouth 3 (three) times a week.   White Petrolatum-Mineral Oil (ARTIFICIAL TEARS) 83-15 % OINT Place 1 Application into both eyes at bedtime.   zinc  oxide 20 % ointment Apply 1 application. topically See admin instructions. To buttocks after every incontinent episode and as needed for redness.   ascorbic acid  (VITAMIN C) 500 MG tablet Take 500 mg by mouth daily. (Patient not taking: Reported on 10/23/2023)   calcium  carbonate (CALCIUM  600) 600 MG TABS tablet Take 600 mg by mouth every 8 (eight) hours. (Patient not taking:  Take 600 mg by mouth every 8 (eight) hours.)   ferrous sulfate 325 (65 FE) MG tablet Take 325 mg by mouth 3 (three) times a week. Monday & Thursday& Saturday (Patient not taking: Reported on 10/23/2023)   Multiple Vitamins-Minerals (PRESERVISION AREDS 2 PO) Take 1 capsule by mouth in the morning and at bedtime. (Patient not taking: Reported on 10/23/2023)   vitamin B-12 (CYANOCOBALAMIN) 1000 MCG tablet Take 1,000 mcg by mouth daily. (Patient not taking: Reported on 10/23/2023)   Vitamin D , Cholecalciferol , 25 MCG (1000 UT) TABS Take 1,000 Units by mouth daily. (Patient not taking: Reported on 10/23/2023)   No facility-administered encounter medications on file as of 10/23/2023.    Review of Systems  Unable to perform ROS: Dementia    Immunization History  Administered Date(s) Administered   Fluad Quad(high Dose 65+) 01/29/2022   H1N1 06/13/2008  Influenza Split 03/05/2006, 02/26/2007, 01/26/2008, 01/18/2009, 12/12/2009, 01/16/2011, 01/29/2012, 02/01/2013, 02/08/2014, 02/13/2015, 01/10/2016, 02/23/2017, 02/24/2018, 01/24/2019, 12/22/2019   Influenza, High Dose Seasonal PF 01/30/2021, 02/04/2023   Moderna Covid-19 Fall Seasonal Vaccine 26yrs & older 02/11/2023   Moderna SARS-COV2 Booster Vaccination 09/18/2021   PFIZER(Purple Top)SARS-COV-2 Vaccination 05/04/2019, 05/25/2019, 09/01/2020, 02/11/2022   Pfizer Covid-19 Vaccine Bivalent Booster 28yrs & up 12/27/2019, 12/26/2020   Pfizer(Comirnaty)Fall Seasonal Vaccine 12 years and older 02/11/2022   Pneumococcal Conjugate-13 02/10/2014   Pneumococcal Polysaccharide-23 06/10/2011   Tdap 06/05/2022   Zoster Recombinant(Shingrix) 07/04/2009, 03/24/2022   Pertinent  Health Maintenance Due  Topic Date Due   INFLUENZA VACCINE  11/06/2023      02/23/2023    2:34 PM 03/02/2023    3:44 PM 03/12/2023   12:56 PM 07/31/2023    1:33 PM 08/11/2023   12:53 PM  Fall Risk  Falls in the past year?  0 0 0 0  Was there an injury with Fall? 1 0 0 0 0  Fall  Risk Category Calculator  0 0 0 0  Patient at Risk for Falls Due to History of fall(s);Impaired balance/gait;Impaired mobility History of fall(s);Impaired balance/gait;Impaired mobility  History of fall(s);Impaired balance/gait;Impaired mobility History of fall(s);Impaired balance/gait  Fall risk Follow up Falls evaluation completed;Education provided;Falls prevention discussed Falls evaluation completed;Education provided;Falls prevention discussed Falls evaluation completed Falls evaluation completed;Education provided Falls evaluation completed;Education provided   Functional Status Survey:    Vitals:   10/23/23 1252  BP: 137/82  Pulse: 66  Resp: 18  Temp: 98.5 F (36.9 C)  SpO2: 90%  Weight: 161 lb 6.4 oz (73.2 kg)  Height: 5' 10 (1.778 m)   Body mass index is 23.16 kg/m. Physical Exam Constitutional:      General: He is not in acute distress. HENT:     Head: Normocephalic.  Eyes:     General:        Right eye: No discharge.        Left eye: No discharge.  Cardiovascular:     Rate and Rhythm: Normal rate and regular rhythm.     Pulses: Normal pulses.     Heart sounds: Normal heart sounds.  Pulmonary:     Effort: Pulmonary effort is normal.     Breath sounds: Normal breath sounds.  Abdominal:     General: Bowel sounds are normal.     Palpations: Abdomen is soft.  Musculoskeletal:     Cervical back: Neck supple.     Comments: LBKA  Skin:    General: Skin is warm.     Capillary Refill: Capillary refill takes less than 2 seconds.     Comments: Stage II to right buttocks, slough in wound bed, Stage I and II to sacrum, granulation tissue in wound bed, no infection, surrounding skin blanchable.   Neurological:     General: No focal deficit present.     Mental Status: He is alert.     Motor: Weakness present.     Gait: Gait abnormal.  Psychiatric:        Mood and Affect: Mood normal.     Labs reviewed: Recent Labs    01/15/23 0000 04/14/23 0000 08/03/23 0000   NA 135* 138 136*  K 4.3 4.8 4.6  CL 99 99 98*  CO2 28* 27* 25*  BUN 25* 23* 19  CREATININE 1.0 0.9 0.8  CALCIUM  8.6* 9.0 8.4*   No results for input(s): AST, ALT, ALKPHOS, BILITOT, PROT, ALBUMIN  in the last 8760 hours. Recent Labs  04/14/23 0000 08/03/23 0000 09/03/23 0000  WBC 6.7 7.5 8.0  NEUTROABS 4,576.00 5,378.00 5,288.00  HGB 12.0* 9.9* 11.0*  HCT 36* 32* 35*  PLT 314 350 311   Lab Results  Component Value Date   TSH 3.07 09/05/2022   Lab Results  Component Value Date   HGBA1C 6.0 09/05/2022   No results found for: CHOL, HDL, LDLCALC, LDLDIRECT, TRIG, CHOLHDL  Significant Diagnostic Results in last 30 days:  No results found.  Assessment/Plan 1. Pressure injury of right buttock, stage 2 (HCC) (Primary) - advanced to stage II - slough in wound bed - start Santyl applications daily with foam dressing  - cont air mattress - cont prostat BID  2. Pressure injury of sacral region, stage 2 (HCC) - noted 07/11 - cont foam dressing applications - cont air mattress  3. Pressure injury of sacral region, stage 1 - see above  4. Moderate protein-calorie malnutrition (HCC) - followed by dietary - BMI 23.16 - cont magic cups and Prostat    Family/ staff Communication: plan discussed with patient and nurse  Labs/tests ordered:  none

## 2023-11-18 ENCOUNTER — Non-Acute Institutional Stay (SKILLED_NURSING_FACILITY): Payer: Self-pay | Admitting: Orthopedic Surgery

## 2023-11-18 ENCOUNTER — Encounter: Payer: Self-pay | Admitting: Orthopedic Surgery

## 2023-11-18 DIAGNOSIS — R52 Pain, unspecified: Secondary | ICD-10-CM | POA: Diagnosis not present

## 2023-11-18 DIAGNOSIS — E440 Moderate protein-calorie malnutrition: Secondary | ICD-10-CM | POA: Diagnosis not present

## 2023-11-18 DIAGNOSIS — R400 Somnolence: Secondary | ICD-10-CM | POA: Diagnosis not present

## 2023-11-18 MED ORDER — ZINC OXIDE 20 % EX OINT: 1.0000 | TOPICAL_OINTMENT | CUTANEOUS | Status: DC

## 2023-11-18 MED ORDER — MORPHINE SULFATE (CONCENTRATE) 20 MG/ML PO SOLN: 5.0000 mg | ORAL | 0 refills | Status: DC | PRN

## 2023-11-18 NOTE — Progress Notes (Signed)
 Location:  [facility] Room Number: N27-A Place of Service:  SNF [phone]) Provider:  Gil No NP  Charlanne Fredia CROME, MD  Patient Care Team: Charlanne Fredia CROME, MD as PCP - General (Internal Medicine) [name], Asberry RAMAN, DO as Consulting Physician (Neurology)  Extended Emergency Contact Information Primary Emergency Contact: Clear,jane Address: [address] [place].          apt [phone] [place] Home Phone: [phone] Mobile Phone: [phone] Relation: Spouse Secondary Emergency Contact: Cilia,kenneth Home Phone: [phone] Mobile Phone: [phone] Relation: Son  Code Status:  DNR Goals of care: Advanced Directive information    11/18/2023   11:31 AM  Advanced Directives  Does Patient Have a Medical Advance Directive? Yes  Type of Estate agent of [place];Living will;Out of facility DNR (pink MOST or yellow form)  Does patient want to make changes to medical advance directive? No - Patient declined  Copy of Healthcare Power of Attorney in Chart? Yes - validated most recent copy scanned in chart (See row information)  Pre-existing out of facility DNR order (yellow form or pink MOST form) Pink Most/Yellow Form available - Physician notified to receive inpatient order     Chief Complaint  Patient presents with   Acute Visit    Poor appetite    HPI:  Pt is a 89 y.o. male seen today for acute visit due to somnolence.   He currently resides on the skilled nursing unit at [facility]. PMH: HTN, HLD, HFpEF, parkinson's, PBA, acute DVT right lower leg 12/27/2020, osteoporosis (past Fosamax use), BPH, CKD, ORIF right acetabular 10/2020, PNA LLL 12/29/2022, and unstable gait.   Followed by hospice. Yesterday he slept all day and did not eat or drink. Hospice stopped po medications and started scheduled morphine . This morning he awake and feeding himself breakfast. He c/o increased pain to buttocks and  intermittent generalized pain. 08/11 tylenol  was stopped and tramadol  was started. He is also sitting on donut pillow. Afebrile. Vitals stable.    Past Medical History:  Diagnosis Date   Acute bronchiolitis    Acute osteomyelitis of calcaneum, left (HCC) 01/11/2021   Anemia    Bursitis of both hips    Complication of anesthesia    wife states pt was a little loopy for several days   Dementia (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   Hearing reduced    hearing aid   History of basal cell carcinoma    Hyperlipidemia    Malaise and fatigue    MRSA infection 01/11/2021   Osteoarthritis    hand   Osteoporosis    Palpitation    Parkinson's disease (HCC)    Peripheral vascular disease (HCC)    blood clots   Pre-diabetes    Vitamin D  deficiency    Past Surgical History:  Procedure Laterality Date   ABDOMINAL AORTOGRAM W/LOWER EXTREMITY N/A 06/12/2021   Procedure: ABDOMINAL AORTOGRAM W/LOWER EXTREMITY;  Surgeon: Darron Deatrice LABOR, MD;  Location: [facility] INVASIVE CV LAB;  Service: Cardiovascular;  Laterality: N/A;   AMPUTATION Left 07/03/2021   Procedure: LEFT BELOW KNEE AMPUTATION;  Surgeon: Harden Jerona GAILS, MD;  Location: [facility] OR;  Service: Orthopedics;  Laterality: Left;   CATARACT EXTRACTION  05/31/2015   Eye/right   COLONOSCOPY  02/05/2005, 03/06/2005   normal, 10 years ago   MOHS micrographic surgery     Face surgery   ORIF ACETABULAR FRACTURE Right 10/09/2020   Procedure: OPEN REDUCTION INTERNAL  FIXATION (ORIF) ACETABULAR FRACTURE;  Surgeon: Celena Sharper, MD;  Location: [facility] OR;  Service: Orthopedics;  Laterality: Right;    No Known Allergies  Allergies as of 11/18/2023   No Known Allergies      Medication List        Accurate as of November 18, 2023 12:07 PM. If you have any questions, ask your nurse or doctor.          acetaminophen  500 MG tablet Commonly known as: TYLENOL  Take 1,000 mg by mouth 3 (three) times daily as needed.   acetaminophen  500 MG tablet Commonly known as:  TYLENOL  Take 1,000 mg by mouth every morning.   albuterol  108 (90 Base) MCG/ACT inhaler Commonly known as: VENTOLIN  HFA Inhale 2 puffs into the lungs every 4 (four) hours as needed for wheezing or shortness of breath.   Artificial Tears 83-15 % Oint Place 1 Application into both eyes at bedtime.   ARTIFICIAL TEARS OP Place 1 drop into both eyes 4 (four) times daily.   Calcium  600 600 MG Tabs tablet Generic drug: calcium  carbonate Take 600 mg by mouth every 8 (eight) hours.   calcium  carbonate 500 MG chewable tablet Commonly known as: TUMS - dosed in mg elemental calcium  Chew 1 tablet by mouth every 8 (eight) hours as needed for indigestion or heartburn.   carbidopa -levodopa  25-100 MG tablet Commonly known as: SINEMET  IR Take 1 tablet by mouth 4 (four) times daily.   carbidopa -levodopa  50-200 MG tablet Commonly known as: SINEMET  CR Take 1 tablet by mouth at bedtime.   ipratropium-albuterol  0.5-2.5 (3) MG/3ML Soln Commonly known as: DUONEB Inhale 3 mLs into the lungs every 8 (eight) hours.   lactose free nutrition Liqd Take 240 mLs by mouth 2 (two) times daily.   morphine  20 MG/5ML solution Take 20 mg by mouth every 4 (four) hours as needed for pain. Give 0.25 ml by mouth every 4 hours as needed for pain/sob   morphine  10 MG/5ML solution Take 20 mg by mouth 6 (six) times daily. Give 0.25ml by mouth six times a day for pain/sob   nystatin  powder Commonly known as: MYCOSTATIN /NYSTOP  Apply 1 Application topically every 12 (twelve) hours as needed.   omeprazole  20 MG capsule Commonly known as: PRILOSEC Take 1 capsule (20 mg total) by mouth daily before breakfast.   polyethylene glycol 17 g packet Commonly known as: MIRALAX  / GLYCOLAX  Take 17 g by mouth daily.   senna 8.6 MG tablet Commonly known as: SENOKOT Take 1 tablet by mouth every other day.   torsemide  20 MG tablet Commonly known as: DEMADEX  Take 20 mg by mouth 3 (three) times a week.   zinc  oxide 20 %  ointment Apply 1 application. topically See admin instructions. To buttocks after every incontinent episode and as needed for redness.        Review of Systems  Unable to perform ROS: Other    Immunization History  Administered Date(s) Administered   Fluad Quad(high Dose 65+) 01/29/2022   H1N1 06/13/2008   Influenza Split 03/05/2006, 02/26/2007, 01/26/2008, 01/18/2009, 12/12/2009, 01/16/2011, 01/29/2012, 02/01/2013, 02/08/2014, 02/13/2015, 01/10/2016, 02/23/2017, 02/24/2018, 01/24/2019, 12/22/2019   Influenza, High Dose Seasonal PF 01/30/2021, 02/04/2023   Moderna Covid-19 Fall Seasonal Vaccine 75yrs & older 02/11/2023   Moderna SARS-COV2 Booster Vaccination 09/18/2021   PFIZER(Purple Top)SARS-COV-2 Vaccination 05/04/2019, 05/25/2019, 09/01/2020, 02/11/2022   Pfizer Covid-19 Vaccine Bivalent Booster 60yrs & up 12/27/2019, 12/26/2020   Pfizer(Comirnaty)Fall Seasonal Vaccine 12 years and older 02/11/2022   Pneumococcal Conjugate-13 02/10/2014  Pneumococcal Polysaccharide-23 06/10/2011   Tdap 06/05/2022   Zoster Recombinant(Shingrix) 07/04/2009, 03/24/2022   Pertinent  Health Maintenance Due  Topic Date Due   INFLUENZA VACCINE  11/06/2023      02/23/2023    2:34 PM 03/02/2023    3:44 PM 03/12/2023   12:56 PM 07/31/2023    1:33 PM 08/11/2023   12:53 PM  Fall Risk  Falls in the past year?  0 0 0 0  Was there an injury with Fall? 1 0 0 0 0  Fall Risk Category Calculator  0 0 0 0  Patient at Risk for Falls Due to History of fall(s);Impaired balance/gait;Impaired mobility History of fall(s);Impaired balance/gait;Impaired mobility  History of fall(s);Impaired balance/gait;Impaired mobility History of fall(s);Impaired balance/gait  Fall risk Follow up Falls evaluation completed;Education provided;Falls prevention discussed Falls evaluation completed;Education provided;Falls prevention discussed Falls evaluation completed Falls evaluation completed;Education provided Falls evaluation  completed;Education provided   Functional Status Survey:    Vitals:   11/18/23 1129  BP: 115/74  Pulse: 81  Resp: 16  Temp: 98.5 F (36.9 C)  SpO2: 93%  Weight: 157 lb 14.4 oz (71.6 kg)  Height: 5' 10 (1.778 m)   Body mass index is 22.66 kg/m. Physical Exam Vitals reviewed.  Constitutional:      General: He is not in acute distress. HENT:     Head: Normocephalic.  Eyes:     General:        Right eye: No discharge.        Left eye: No discharge.  Cardiovascular:     Rate and Rhythm: Normal rate and regular rhythm.     Pulses: Normal pulses.     Heart sounds: Normal heart sounds.  Pulmonary:     Effort: Pulmonary effort is normal.     Breath sounds: Normal breath sounds.  Abdominal:     General: Bowel sounds are normal.     Palpations: Abdomen is soft.  Musculoskeletal:     Cervical back: Neck supple.     Right lower leg: No edema.  Skin:    General: Skin is warm.     Capillary Refill: Capillary refill takes less than 2 seconds.  Neurological:     General: No focal deficit present.     Mental Status: He is alert. Mental status is at baseline.     Gait: Gait abnormal.  Psychiatric:        Mood and Affect: Mood normal.     Labs reviewed: Recent Labs    01/15/23 0000 04/14/23 0000 08/03/23 0000  NA 135* 138 136*  K 4.3 4.8 4.6  CL 99 99 98*  CO2 28* 27* 25*  BUN 25* 23* 19  CREATININE 1.0 0.9 0.8  CALCIUM  8.6* 9.0 8.4*   No results for input(s): AST, ALT, ALKPHOS, BILITOT, PROT, ALBUMIN  in the last 8760 hours. Recent Labs    04/14/23 0000 08/03/23 0000 09/03/23 0000  WBC 6.7 7.5 8.0  NEUTROABS 4,576.00 5,378.00 5,288.00  HGB 12.0* 9.9* 11.0*  HCT 36* 32* 35*  PLT 314 350 311   Lab Results  Component Value Date   TSH 3.07 09/05/2022   Lab Results  Component Value Date   HGBA1C 6.0 09/05/2022   No results found for: CHOL, HDL, LDLCALC, LDLDIRECT, TRIG, CHOLHDL  Significant Diagnostic Results in last 30 days:   No results found.  Assessment/Plan 1. Generalized pain (Primary) - ongoing, followed by hospice - buttocks most bothersome - 08/11 started on scheduled tramadol  - 08/12 scheduled morphine   started due to somnolence - continue morphine  prn if tramadol  ineffective - morphine  (ROXANOL) 20 MG/ML concentrated solution; Take 0.25 mLs (5 mg total) by mouth every 4 (four) hours as needed for severe pain (pain score 7-10) or shortness of breath.  Dispense: 15 mL; Refill: 0  2. Somnolence - see above - alert today, eating and drinkin - 08/12 po medications discontinued per hospice - will restart omeprazole , miralax , senna and tramadol    3. Moderate protein-calorie malnutrition (HCC) - BMI 22.66 - cont magic cups - cont monthly weights   Family/ staff Communication: plan discussed with patient, wife, hospice, [facility] nurse  Labs/tests ordered:  none

## 2023-11-19 ENCOUNTER — Other Ambulatory Visit: Payer: Self-pay | Admitting: Internal Medicine

## 2023-11-19 MED ORDER — TRAMADOL HCL 50 MG PO TABS: 50.0000 mg | ORAL_TABLET | Freq: Two times a day (BID) | ORAL | 1 refills | Status: DC

## 2023-11-23 ENCOUNTER — Other Ambulatory Visit: Payer: Self-pay | Admitting: Orthopedic Surgery

## 2023-11-23 DIAGNOSIS — Z515 Encounter for palliative care: Secondary | ICD-10-CM

## 2023-11-23 MED ORDER — LORAZEPAM 0.5 MG PO TABS: 0.5000 mg | ORAL_TABLET | ORAL | 1 refills | Status: DC | PRN

## 2023-12-07 DEATH — deceased

## 2024-03-14 ENCOUNTER — Ambulatory Visit: Payer: Medicare Other | Admitting: Neurology
